# Patient Record
Sex: Female | Born: 1965 | Race: White | Hispanic: No | Marital: Married | Smoking: Former smoker
Health system: Southern US, Community
[De-identification: ages and names within clinical notes are randomized; demographics above are authoritative.]

## PROBLEM LIST (undated history)

## (undated) DIAGNOSIS — F32A Depression, unspecified: Secondary | ICD-10-CM

## (undated) DIAGNOSIS — F419 Anxiety disorder, unspecified: Secondary | ICD-10-CM

## (undated) DIAGNOSIS — E785 Hyperlipidemia, unspecified: Secondary | ICD-10-CM

## (undated) DIAGNOSIS — I498 Other specified cardiac arrhythmias: Secondary | ICD-10-CM

## (undated) DIAGNOSIS — R0602 Shortness of breath: Secondary | ICD-10-CM

## (undated) DIAGNOSIS — L0390 Cellulitis, unspecified: Secondary | ICD-10-CM

## (undated) DIAGNOSIS — E119 Type 2 diabetes mellitus without complications: Secondary | ICD-10-CM

## (undated) DIAGNOSIS — J439 Emphysema, unspecified: Secondary | ICD-10-CM

## (undated) DIAGNOSIS — E039 Hypothyroidism, unspecified: Secondary | ICD-10-CM

## (undated) DIAGNOSIS — E7800 Pure hypercholesterolemia, unspecified: Secondary | ICD-10-CM

## (undated) DIAGNOSIS — F319 Bipolar disorder, unspecified: Secondary | ICD-10-CM

## (undated) DIAGNOSIS — G4730 Sleep apnea, unspecified: Secondary | ICD-10-CM

## (undated) DIAGNOSIS — J45909 Unspecified asthma, uncomplicated: Secondary | ICD-10-CM

## (undated) DIAGNOSIS — G629 Polyneuropathy, unspecified: Secondary | ICD-10-CM

## (undated) HISTORY — DX: Shortness of breath: R06.02

## (undated) HISTORY — DX: Hypothyroidism, unspecified: E03.9

## (undated) HISTORY — PX: TUBAL LIGATION: SHX77

## (undated) HISTORY — PX: FOOT SURGERY: SHX648

## (undated) HISTORY — PX: NECK SURGERY: SHX720

## (undated) HISTORY — DX: Chest pain, unspecified: R07.9

## (undated) HISTORY — DX: Other specified cardiac arrhythmias: I49.8

## (undated) HISTORY — PX: TONSILLECTOMY: SUR1361

## (undated) HISTORY — DX: Pure hypercholesterolemia, unspecified: E78.00

## (undated) HISTORY — DX: Hyperlipidemia, unspecified: E78.5

## (undated) HISTORY — PX: NASAL SEPTUM SURGERY: SHX37

## (undated) HISTORY — DX: Emphysema, unspecified: J43.9

## (undated) HISTORY — DX: Type 2 diabetes mellitus without complications: E11.9

---

## 1998-02-26 ENCOUNTER — Inpatient Hospital Stay (HOSPITAL_COMMUNITY): Admission: AD | Admit: 1998-02-26 | Discharge: 1998-02-26 | Payer: Self-pay | Admitting: Obstetrics and Gynecology

## 1998-02-26 ENCOUNTER — Encounter: Payer: Self-pay | Admitting: Obstetrics and Gynecology

## 1998-03-20 ENCOUNTER — Encounter: Payer: Self-pay | Admitting: Obstetrics and Gynecology

## 1998-03-20 ENCOUNTER — Ambulatory Visit (HOSPITAL_COMMUNITY): Admission: RE | Admit: 1998-03-20 | Discharge: 1998-03-20 | Payer: Self-pay | Admitting: Obstetrics and Gynecology

## 1998-03-21 ENCOUNTER — Inpatient Hospital Stay (HOSPITAL_COMMUNITY): Admission: AD | Admit: 1998-03-21 | Discharge: 1998-03-23 | Payer: Self-pay | Admitting: Obstetrics and Gynecology

## 1998-03-23 ENCOUNTER — Encounter (HOSPITAL_COMMUNITY): Admission: RE | Admit: 1998-03-23 | Discharge: 1998-06-21 | Payer: Self-pay | Admitting: Obstetrics and Gynecology

## 1999-03-10 ENCOUNTER — Encounter: Admission: RE | Admit: 1999-03-10 | Discharge: 1999-03-10 | Payer: Self-pay | Admitting: Specialist

## 1999-03-10 ENCOUNTER — Encounter: Payer: Self-pay | Admitting: Specialist

## 1999-06-15 ENCOUNTER — Other Ambulatory Visit: Admission: RE | Admit: 1999-06-15 | Discharge: 1999-06-15 | Payer: Self-pay | Admitting: Obstetrics and Gynecology

## 1999-10-22 ENCOUNTER — Encounter: Admission: RE | Admit: 1999-10-22 | Discharge: 1999-10-22 | Payer: Self-pay | Admitting: Family Medicine

## 1999-10-22 ENCOUNTER — Encounter: Payer: Self-pay | Admitting: Family Medicine

## 2000-07-26 ENCOUNTER — Other Ambulatory Visit: Admission: RE | Admit: 2000-07-26 | Discharge: 2000-07-26 | Payer: Self-pay | Admitting: Obstetrics and Gynecology

## 2001-05-28 ENCOUNTER — Encounter: Admission: RE | Admit: 2001-05-28 | Discharge: 2001-05-28 | Payer: Self-pay | Admitting: Otolaryngology

## 2001-05-28 ENCOUNTER — Encounter: Payer: Self-pay | Admitting: Otolaryngology

## 2001-09-04 ENCOUNTER — Other Ambulatory Visit: Admission: RE | Admit: 2001-09-04 | Discharge: 2001-09-04 | Payer: Self-pay | Admitting: Obstetrics and Gynecology

## 2002-09-23 ENCOUNTER — Other Ambulatory Visit: Admission: RE | Admit: 2002-09-23 | Discharge: 2002-09-23 | Payer: Self-pay | Admitting: Obstetrics and Gynecology

## 2003-12-16 ENCOUNTER — Other Ambulatory Visit: Admission: RE | Admit: 2003-12-16 | Discharge: 2003-12-16 | Payer: Self-pay | Admitting: Obstetrics and Gynecology

## 2004-05-14 ENCOUNTER — Ambulatory Visit: Payer: Self-pay | Admitting: Cardiovascular Disease

## 2004-05-20 ENCOUNTER — Ambulatory Visit: Payer: Self-pay

## 2004-05-20 ENCOUNTER — Ambulatory Visit: Payer: Self-pay | Admitting: Cardiovascular Disease

## 2004-06-11 ENCOUNTER — Ambulatory Visit: Payer: Self-pay | Admitting: Cardiovascular Disease

## 2004-07-13 ENCOUNTER — Ambulatory Visit: Payer: Self-pay | Admitting: Cardiovascular Disease

## 2005-05-06 ENCOUNTER — Other Ambulatory Visit: Admission: RE | Admit: 2005-05-06 | Discharge: 2005-05-06 | Payer: Self-pay | Admitting: Obstetrics and Gynecology

## 2005-08-03 ENCOUNTER — Ambulatory Visit (HOSPITAL_COMMUNITY): Admission: RE | Admit: 2005-08-03 | Discharge: 2005-08-04 | Payer: Self-pay | Admitting: Neurosurgery

## 2006-11-17 ENCOUNTER — Ambulatory Visit: Payer: Self-pay | Admitting: Cardiovascular Disease

## 2007-12-20 ENCOUNTER — Ambulatory Visit: Payer: Self-pay | Admitting: Cardiovascular Disease

## 2008-01-08 ENCOUNTER — Encounter: Payer: Self-pay | Admitting: Cardiovascular Disease

## 2008-01-08 ENCOUNTER — Ambulatory Visit: Payer: Self-pay

## 2009-04-13 ENCOUNTER — Telehealth: Payer: Self-pay | Admitting: Cardiovascular Disease

## 2009-04-20 DIAGNOSIS — I10 Essential (primary) hypertension: Secondary | ICD-10-CM | POA: Insufficient documentation

## 2009-04-20 DIAGNOSIS — R0602 Shortness of breath: Secondary | ICD-10-CM

## 2009-04-20 DIAGNOSIS — R079 Chest pain, unspecified: Secondary | ICD-10-CM | POA: Insufficient documentation

## 2009-04-20 DIAGNOSIS — E119 Type 2 diabetes mellitus without complications: Secondary | ICD-10-CM | POA: Insufficient documentation

## 2009-04-20 DIAGNOSIS — I471 Supraventricular tachycardia, unspecified: Secondary | ICD-10-CM | POA: Insufficient documentation

## 2009-04-20 DIAGNOSIS — E7800 Pure hypercholesterolemia, unspecified: Secondary | ICD-10-CM | POA: Insufficient documentation

## 2009-04-20 DIAGNOSIS — E785 Hyperlipidemia, unspecified: Secondary | ICD-10-CM | POA: Insufficient documentation

## 2009-04-20 DIAGNOSIS — I498 Other specified cardiac arrhythmias: Secondary | ICD-10-CM | POA: Insufficient documentation

## 2009-04-20 DIAGNOSIS — E039 Hypothyroidism, unspecified: Secondary | ICD-10-CM | POA: Insufficient documentation

## 2009-04-22 ENCOUNTER — Ambulatory Visit: Payer: Self-pay | Admitting: Cardiovascular Disease

## 2009-05-12 ENCOUNTER — Telehealth: Payer: Self-pay | Admitting: Cardiovascular Disease

## 2010-02-18 ENCOUNTER — Ambulatory Visit (HOSPITAL_COMMUNITY): Admission: RE | Admit: 2010-02-18 | Discharge: 2010-02-18 | Payer: Self-pay | Admitting: Obstetrics and Gynecology

## 2010-03-22 ENCOUNTER — Encounter: Payer: Self-pay | Admitting: Cardiovascular Disease

## 2010-05-07 ENCOUNTER — Encounter: Payer: Self-pay | Admitting: Cardiovascular Disease

## 2010-05-07 ENCOUNTER — Ambulatory Visit: Payer: Self-pay | Admitting: Cardiovascular Disease

## 2010-06-08 NOTE — Progress Notes (Signed)
Summary: REFILL   Phone Note Refill Request Message from:  Patient on May 12, 2009 8:34 AM  Refills Requested: Medication #1:  CARVEDILOL 12.5 MG TABS Take one tablet by mouth twice a day [facility] [phone] PT ONLY HAS TWO MORE DAYS LEFT WANTS A FEW PILLS CALLED TO [facility] [address] [phone]  Initial call taken by: Judie Grieve,  May 12, 2009 8:36 AM    Prescriptions: CARVEDILOL 12.5 MG TABS (CARVEDILOL) Take one tablet by mouth twice a day  #30 x 0   Entered by:   Kem Parkinson   Authorized by:   Colon Branch, MD, [facility]   Signed by:   Kem Parkinson on 05/12/2009   Method used:   Electronically to        [facility] [address] [id number] (retail)       [address]       [place], [place]  [address]       Ph: [phone] or [phone]       Fax: [phone]   RxID:   [id number] CARVEDILOL 12.5 MG TABS (CARVEDILOL) Take one tablet by mouth twice a day  #180 x 3   Entered by:   Kem Parkinson   Authorized by:   Colon Branch, MD, [facility]   Signed by:   Kem Parkinson on 05/12/2009   Method used:   Electronically to        [facility]* (mail-order)             ,          Ph: [phone]       Fax: [phone]   RxID:   [id number]

## 2010-06-10 NOTE — Assessment & Plan Note (Signed)
Summary: 1 YR F/U  Medications Added LAMICTAL XR 300 MG XR24H-TAB (LAMOTRIGINE) 1 tab by mouth once daily      Allergies Added: NKDA  CC:  check up.  History of Present Illness: Veronica Rivers is seen today for f.u of HTN, elevated lipids atypicdal chest pain history of SVT.  She has been doing well.  She has some SOB in the moring when she wakes up.  I suspect she has some obstructive sleep apnea as her husband indicates she snores loudly.  She eats too many carbs and we discussed her diet and weight loss at length.  She had an normal echo in 2009.  She is not having recurrent palpitations or SSCP.  She has been compliant with her meds.  She had an LDL of 64 at her primaries recently with normal LFT's and TSH   Current Problems (verified): 1)  Hypertension  (ICD-401.9) 2)  Chest Pain  (ICD-786.50) 3)  Hypercholesterolemia  (ICD-272.0) 4)  Supraventricular Tachycardia  (ICD-427.89) 5)  Hyperlipidemia  (ICD-272.4) 6)  Hypothyroidism  (ICD-244.9) 7)  Dm  (ICD-250.00) 8)  Dyspnea  (ICD-786.05)  Current Medications (verified): 1)  Carvedilol 12.5 Mg Tabs (Carvedilol) .... Take One Tablet By Mouth Twice A Day 2)  Levothyroxine Sodium 75 Mcg Tabs (Levothyroxine Sodium) .[name].. 1 Tab  By Mouth Once Daily 3)  Lamictal Xr 300 Mg Xr24h-Tab (Lamotrigine) .[name].. 1 Tab By Mouth Once Daily 4)  Abilify 15 Mg Tabs (Aripiprazole) .[name].. 1 Tab By Mouth Once Daily 5)  Benztropine Mesylate 2 Mg Tabs (Benztropine Mesylate) .... 2 Tabs By Mouth Once Daily 6)  Seroquel 200 Mg Tabs (Quetiapine Fumarate) .[name].. 1 Tab By Mouth Once Daily 7)  Crestor 10 Mg Tabs (Rosuvastatin Calcium) .... Take One Tablet By Mouth Daily. 8)  Metformin Hcl 500 Mg Tabs (Metformin Hcl) .[name].. 1 Tab By Mouth Two Times A Day 9)  Lovaza 1 Gm Caps (Omega-3-Acid Ethyl Esters) .... 2 Tabs By Mouth Two Times A Day  Allergies (verified): No Known Drug Allergies  Past History:  Past Medical History: Last updated: 04/20/2009 Current Problems:    HYPERTENSION (ICD-401.9) CHEST PAIN (ICD-786.50) atypical  normal echo 2009 HYPERCHOLESTEROLEMIA (ICD-272.0) SUPRAVENTRICULAR TACHYCARDIA (ICD-427.89) HYPERLIPIDEMIA (ICD-272.4) HYPOTHYROIDISM (ICD-244.9) DM (ICD-250.00) DYSPNEA (ICD-786.05)  Family History: Last updated: 04/20/2009 noncontributory  Social History: Last updated: 04/22/2009 Married  71 yo daughter Sedentary Doesn't work Non-drinkier Non-smoker  Review of Systems       Denies fever, malais, weight loss, blurry vision, decreased visual acuity, cough, sputum, SOB, hemoptysis, pleuritic pain, palpitaitons, heartburn, abdominal pain, melena, lower extremity edema, claudication, or rash.   Vital Signs:  Patient profile:   45 year old female Height:      69 inches Weight:      262 pounds BMI:     38.83 Pulse rate:   85 / minute Resp:     14 per minute BP sitting:   104 / 70  (left arm)  Vitals Entered By: Kem Parkinson (May 07, 2010 10:44 AM)  Physical Exam  General:  Affect appropriate Healthy:  appears stated age HEENT: normal Neck supple with no adenopathy JVP normal no bruits no thyromegaly Lungs clear with no wheezing and good diaphragmatic motion Heart:  S1/S2 no murmur,rub, gallop or click PMI normal Abdomen: benighn, BS positve, no tenderness, no AAA no bruit.  No HSM or HJR Distal pulses intact with no bruits No edema Neuro non-focal Skin warm and dry    Impression & Recommendations:  Problem # 1:  HYPERTENSION (ICD-401.9) Well controlled Her updated medication list for this problem includes:    Carvedilol 12.5 Mg Tabs (Carvedilol) .[name]... Take one tablet by mouth twice a day  Problem # 2:  HYPERCHOLESTEROLEMIA (ICD-272.0) Continue medical Rx labs per primary Her updated medication list for this problem includes:    Crestor 10 Mg Tabs (Rosuvastatin calcium) .[name]... Take one tablet by mouth daily.    Lovaza 1 Gm Caps (Omega-3-acid ethyl esters) .[name]... 2 tabs by mouth two times  a day  Problem # 3:  SUPRAVENTRICULAR TACHYCARDIA (ICD-427.89) Quiescent continue BB Her updated medication list for this problem includes:    Carvedilol 12.5 Mg Tabs (Carvedilol) .[name]... Take one tablet by mouth twice a day  Patient Instructions: 1)  Your physician wants you to follow-up in:  ONE YEAR You will receive a reminder letter in the mail two months in advance. If you don't receive a letter, please call our office to schedule the follow-up appointment.

## 2010-07-22 LAB — BASIC METABOLIC PANEL
CO2: 26 mEq/L (ref 19–32)
Creatinine, Ser: 0.8 mg/dL (ref 0.4–1.2)
GFR calc Af Amer: >60 mL/min (ref >60)
GFR calc non Af Amer: >60 mL/min (ref >60)
Glucose, Bld: 128 mg/dL — ABNORMAL HIGH (ref 70–99)
Potassium: 4.3 mEq/L (ref 3.5–5.1)
Sodium: 135 mEq/L (ref 135–145)

## 2010-07-22 LAB — GLUCOSE, CAPILLARY
Glucose-Capillary: 160 mg/dL — ABNORMAL HIGH (ref 70–99)
Glucose-Capillary: 173 mg/dL — ABNORMAL HIGH (ref 70–99)

## 2010-07-22 LAB — CBC
HCT: 36.6 % (ref 36.0–46.0)
MCHC: 33.7 g/dL (ref 30.0–36.0)
Platelets: 276 10*3/uL (ref 150–400)
RDW: 13.5 % (ref 11.5–15.5)

## 2010-07-22 LAB — SURGICAL PCR SCREEN

## 2010-07-22 LAB — MRSA CULTURE

## 2010-09-21 NOTE — Assessment & Plan Note (Signed)
[facility]                            CARDIOLOGY OFFICE NOTE   NAME:Veronica Rivers                        MRN:          [id number]  DATE:11/16/2006                            DOB:          [birth year]    Veronica Rivers returns today for followup.  I have not seen her in about  two years.  She has had a history of SVT and hypercholesterolemia.  She  has been doing fairly well since I last saw her.  She has finally had  cervical neck surgery with Dr. Lovell Sheehan.  She has had significant  improvement.  She has chronic pain syndrome.  She has not had any  significant palpitations, PND, or orthopnea.  There has been no chest  pain.  She has had a structurally normal heart in the past.  Her LDL  cholesterol has been about 125, but I know that she has gained about 10  pounds.   In talking to her, review of systems is otherwise remarkable for  improved pain.  She is off her Depakote.  She is on a Lidoderm patch  now.  She has been compliant with her beta blocker.  Review of systems  is otherwise negative.   MEDICATIONS:  1. Flexeril 10 mg at night.  2. Vitamins.  3. Toprol 50 a day.  4. Zoloft 100 a day.  5. Opana 10 mg b.i.d. for pain.  6. Lidoderm patch.  7. Diazepam.   PHYSICAL EXAMINATION:  GENERAL:  Remarkable for a healthy-appearing  middle-aged white female in no distress.  Affect is appropriate.  VITAL SIGNS:  Weight is 231.  Blood pressure is 130/70.  Pulse is 74 and  regular.  Afebrile.  Respiratory rate is 14.  HEENT:  Normal.  NECK:  Remarkable for an anterior cervical neck scar from her surgery.  JVP is not elevated.  No thyromegaly.  No lymphadenopathy.  No bruits.  LUNGS:  Clear with good diaphragmatic motion.  No wheezing.  CARDIAC:  There is an S1 and S2 with normal heart sounds.  PMI is  normal.  ABDOMEN:  Benign.  Bowel sounds are positive.  No organomegaly.  No  hepatosplenomegaly.  No hepatojugular reflux.  VASCULAR:  His femorals  are +4 bilaterally with no bruit.  PT's are +3.  EXTREMITIES:  There is no lower extremity edema.  NEURO:  Nonfocal.  There is no muscular weakness.   Her EKG is essentially normal, showing sinus rhythm, with an incomplete  right bundle branch block.   IMPRESSION:  1. History of supraventricular tachycardia, currently stable.  No      indication for ablation.  Continue beta blocker therapy.  She will      call us if she has any exacerbations.  2. Hypercholesterolemia:  LDL 125.  Dietary and nutrition consult      given.  No need for statin drug at this time.  3. Chronic pain syndrome:  Continue current medications.  Lidoderm      patch probably actually helps with arrhythmia.   She has no documented structural heart disease.  I will  see her back in  a year.     Noralyn Pick. Eden Emms, MD, [facility]  Electronically Signed    PCN/MedQ  DD: 11/16/2006  DT: 11/16/2006  Job #: [id number]

## 2010-09-21 NOTE — Assessment & Plan Note (Signed)
[facility]                            CARDIOLOGY OFFICE NOTE   NAME:Lockyer, RANDALL RAMPERSAD                        MRN:          [id number]  DATE:12/20/2007                            DOB:          1966-05-01    Veronica Rivers returns today for followup.   HISTORY OF PRESENT ILLNESS:  Since I last saw her, she has had some  atypical chest pain.  It is sharp.  It is in the center of chest, it is  not certainly exertional.  She is trying to increase her activities  some.  There is no associated diaphoresis.  She has exertional dyspnea.  I suspect this is related to her weight.  There is no active wheezing or  coughing.  She has trace lower extremity edema.   Her LDL cholesterol was in excess of 125.  She is on Crestor 10 mg a day  now.   She also had elevated blood sugars ranging from 110-120 range.  However,  I thought it was unusual that her hemoglobin A1c was only 5.7.  Anyway,  she has been started on metformin 500 today.  I had a long discussion  with Analise today.  She understands that she has type 2 diabetes, that her  insulin resistance is due to her diet and excess weight.  She needs to  take this seriously.   She has had dietary consultation.   From a cardiac perspective, I think her chest pain is atypical and her  dyspnea is functional.  However, today's EKG showed poor R wave  progression and was read as cannot rule out a previous anterior wall MI.  Her last echocardiogram is in 2006 and showed normal LV function, but  this needs to be repeated in the setting of increasing coronary risk  factors and dyspnea.   REVIEW OF SYSTEMS:  Otherwise negative.  Apparently Clovis Riley would  like to change her Toprol.  I think it is reasonable to switch to Coreg  for decreased insulin resistance.   MEDICATIONS:  She is currently on,  1. Seroquel 100 a day.  2. Benztropine.  3. Levothyroxine 25 mcg a day.  4. Metformin 500 a day.  5. Crestor 10 a day.  6.  Toprol 50 a day.   PHYSICAL EXAMINATION:  GENERAL:  Remarkable for an overweight white  female, in no distress.  VITAL SIGNS:  Her pulse is 76 and regular.  Blood pressure is 120/60,  respiratory rate 14, afebrile.  HEENT:  Unremarkable.  NECK:  Carotids are normal without bruit, no lymphadenopathy, no  thyromegaly, no JVP elevation.  LUNGS:  Clear.  Good diaphragmatic motion.  No wheezing.  S1 and S2 with  soft systolic murmur.  PMI normal.  ABDOMEN:  Benign.  Bowel sounds positive.  No AAA, no tenderness, no  bruit, no hepatosplenomegaly, no hepatojugular reflux.  EXTREMITIES:  Distal pulses are intact.  No edema.  NEURO:  Nonfocal.  SKIN:  Warm and dry.   EKG shows sinus rhythm, poor R-wave progression, incomplete right bundle-  branch block.   IMPRESSION:  1. Atypical chest  pain.  No need for stress test at this time.  Check      2-D echocardiogram to rule out regional wall motion abnormalities.  2. Dyspnea, soft systolic murmur, abnormal EKG.  Check 2-D      echocardiogram, assess her LV function, rule out significant      valvular heart disease.  3. Diabetes.  Decrease carbohydrates.  Follow [employer] type diet.      Continue Glucophage.  Follow up with Clovis Riley.  Change Toprol      to Coreg for decreased insulin resistance.  4. Hypertension, currently well controlled.  Continue current      medication including low-sodium diet.  5. Hypothyroidism.  Continue levothyroxine 25 mcg a day.  TSH and T4      in 6 months.  6. Hyperlipidemia.  Continue Crestor, liver profile in 3 months.      Followup primary care MD as long as her echo is normal.  I will see      her back in 6 months.     Noralyn Pick. Eden Emms, MD, [facility]  Electronically Signed    PCN/MedQ  DD: 12/20/2007  DT: 12/21/2007  Job #: [id number]   cc:   Estrella Myrtle. Chestine Spore, NP

## 2010-09-24 NOTE — Op Note (Signed)
NAMESKYLYN, Veronica Rivers                 ACCOUNT NO.:  [id number]   MEDICAL RECORD NO.:  [id number]          PATIENT TYPE:  AMB   LOCATION:  SDS                          FACILITY:  [facility]   PHYSICIAN:  Cristi Loron, M.D.DATE OF BIRTH:  01-05-1966   DATE OF PROCEDURE:  08/03/2005  DATE OF DISCHARGE:                                 OPERATIVE REPORT   BRIEF HISTORY:  The patient is a 45 year old white female who suffered from  neck and right-greater-than-left arm pain.  She has failed medical  management and was worked up with cervical MRI which demonstrated a  herniated disk at C6-7.  I discussed the various treatment options with the  patient including surgery.  The patient has weighed the risks, benefits and  alternatives to surgery and has decided to proceed with a C6-7 anterior  cervical diskectomy, fusion and plating.   PREOPERATIVE DIAGNOSIS:  C6-7 herniated nucleus pulposus, spinal stenosis,  cervical radiculopathy and cervicalgia.   POSTOPERATIVE DIAGNOSIS:  C6-7 herniated nucleus pulposus, spinal stenosis,  cervical radiculopathy and cervicalgia.   PROCEDURE:  C6-7 extensive anterior cervical diskectomy/decompression;  interbody iliac crest allograft arthrodesis; anterior cervical plating  (CODMAN Slim-LOC titanium plate and screws).   SURGEON:  Cristi Loron, M.D.   ASSISTANT:  Hewitt Shorts, M.D.   ANESTHESIA:  General endotracheal.   ESTIMATED BLOOD LOSS:  50  mL.   SPECIMENS:  None.   DRAINS:  None.   DESCRIPTION OF PROCEDURE:  The patient was brought to the operating room by  the anesthesia team.  General endotracheal anesthesia was induced.  The  patient remained in a supine position.  A roll was placed under her  shoulders to place her neck in slight extension.  Her anterior cervical  region was then prepared with Betadine scrub with Betadine solution and  sterile drapes were applied.  I then injected the area to be incised with  Marcaine with  epinephrine and I used a scalpel to make a transverse incision  in the patient's left anterior neck.  I used the Metzenbaum scissors to  divide the platysma muscle and then to dissect medial to the  sternocleidomastoid muscles, jugular vein and carotid artery and I carefully  dissected down towards the anterior cervical spine, carefully identifying  the esophagus, retracting it medially.  We then cleared the soft tissue from  the anterior cervical spine using Kittner swabs and then inserted a bent  spinal needle into the upper exposed intervertebral disk space.  We obtained  intraoperative radiograph to confirm our location.   We then used electrocautery to detach the medial border of the longus colli  bilaterally from C6-7 intervertebral disk space.  We inserted the Caspar  self-retaining retractor for exposure.  We then incised the C6-7  intervertebral disk with a 15 blade scalpel and performed a partial  intervertebral diskectomy using the pituitary forceps and Carlen curettes.  We then inserted distraction screws at C6 and C7 and distracted the  interspace and then used a high-speed drill to decorticate the vertebral  endplates of C7, drill away the remainder of  the C6-7 intervertebral disk  and to thin out the posterior longitudinal ligament and drill away some of  the posterior spondylosis.  We then incised the thinned-out ligament with an  arachnoid knife and remove it with the Kerrison punch, undercutting the  vertebral endplates, decompressing the thecal sac.  We then performed  foraminotomies about the bilateral C7 nerve root, completing the  decompression.  Of note, we did encounter a moderate-sized disk herniation  on the right, compressing the right C7 nerve root.   We now turned out attention to the arthrodesis.  We obtained iliac crest  tricortical allograft and fashioned it to these approximate dimensions:  Eight millimeters in height and 1 cm in depth.  We inserted the  bone graft  in the distracted C6-C7 interspace and then removed the distraction screws.  There was a good snug fit of bone graft.   We now turned our attention to the anterior spinal instrumentation.  We used  a high-speed drill to remove some anterior spondylosis from the vertebral  endplates of C6-7 so that the plate would lay down flit.  We selected the  appropriate-length CODMAN Slim-LOC anterior cervical space and laid it along  the anterior aspect of the  vertebral bodies at C6 and C7.  We drilled two  12-mm holes at C6, two at C7.  We then secured the plate to the  intervertebral bodies by placing two 12-mm self-tapping screws at C6 and 2  at C7.  We then obtained an intraoperative radiograph.  We could see the  upper plate and screws, but the lower plate and screws we could not  visualize because of the patient's shoulders, but they looked good in vivo.  We therefore secured the screws and plate by locking each cam.   We then obtained hemostasis using bipolar electrocautery.  We irrigated the  wound out with Bacitracin solution.  We removed the solution, then removed  the Caspar self-retaining retractor.  We inspected the esophagus for any  damage and there was none apparent.  We then reapproximated the patient's  platysmal muscle with interrupted 3-0 Vicryl suture, the subcutaneous tissue  with interrupted 3-0 Vicryl and the skin with Steri-Strips and Benzoin.  The  wound was then coated with Bacitracin ointment, a sterile dressing was  applied and the drapes were removed.  The patient was subsequently extubated  by the anesthesia team and transported to the postanesthesia care unit in  stable condition.  All sponge, instrument and needle counts were correct at  the end of this case.      Cristi Loron, M.D.  Electronically Signed     JDJ/MEDQ  D:  08/03/2005  T:  08/05/2005  Job:  [id number]

## 2011-05-17 ENCOUNTER — Other Ambulatory Visit: Payer: Self-pay | Admitting: Cardiovascular Disease

## 2011-08-19 ENCOUNTER — Telehealth: Payer: Self-pay | Admitting: Cardiovascular Disease

## 2011-08-19 MED ORDER — CARVEDILOL 12.5 MG PO TABS: 12.5000 mg | ORAL_TABLET | Freq: Two times a day (BID) | ORAL | Status: DC

## 2011-08-19 NOTE — Telephone Encounter (Signed)
Pt calling re needing partial refill of Coreg, appt 4-23, needed appt before refill but is out and needs enough to last till then @ [facility] [address], pls call if any problems [phone]

## 2011-08-30 ENCOUNTER — Ambulatory Visit: Payer: Self-pay | Admitting: Cardiovascular Disease

## 2011-09-09 ENCOUNTER — Other Ambulatory Visit: Payer: Self-pay | Admitting: Family Medicine

## 2011-09-09 DIAGNOSIS — R413 Other amnesia: Secondary | ICD-10-CM

## 2011-09-09 DIAGNOSIS — R42 Dizziness and giddiness: Secondary | ICD-10-CM

## 2011-09-12 ENCOUNTER — Ambulatory Visit
Admission: RE | Admit: 2011-09-12 | Discharge: 2011-09-12 | Disposition: A | Discharge status: Home / Self Care | Payer: BC Managed Care – PPO | Source: Ambulatory Visit | Attending: Family Medicine | Admitting: Family Medicine

## 2011-09-12 DIAGNOSIS — R42 Dizziness and giddiness: Secondary | ICD-10-CM

## 2011-09-12 DIAGNOSIS — R413 Other amnesia: Secondary | ICD-10-CM

## 2011-09-12 MED ORDER — GADOBENATE DIMEGLUMINE 529 MG/ML IV SOLN
20.0000 mL | Freq: Once | INTRAVENOUS | Status: AC | PRN
  Administered 2011-09-12: 20 mL via INTRAVENOUS

## 2011-09-15 ENCOUNTER — Encounter: Payer: Self-pay | Admitting: Cardiovascular Disease

## 2011-09-15 ENCOUNTER — Encounter: Payer: Self-pay | Admitting: *Deleted

## 2011-09-16 ENCOUNTER — Encounter: Payer: Self-pay | Admitting: Cardiovascular Disease

## 2011-09-16 ENCOUNTER — Ambulatory Visit (INDEPENDENT_AMBULATORY_CARE_PROVIDER_SITE_OTHER): Payer: BC Managed Care – PPO | Admitting: Cardiovascular Disease

## 2011-09-16 VITALS — BP 135/84 | HR 70 | Wt 261.0 lb

## 2011-09-16 DIAGNOSIS — I498 Other specified cardiac arrhythmias: Secondary | ICD-10-CM

## 2011-09-16 DIAGNOSIS — E7800 Pure hypercholesterolemia, unspecified: Secondary | ICD-10-CM

## 2011-09-16 DIAGNOSIS — E119 Type 2 diabetes mellitus without complications: Secondary | ICD-10-CM

## 2011-09-16 DIAGNOSIS — I10 Essential (primary) hypertension: Secondary | ICD-10-CM

## 2011-09-16 MED ORDER — CARVEDILOL 12.5 MG PO TABS: 12.5000 mg | ORAL_TABLET | Freq: Two times a day (BID) | ORAL | Status: DC

## 2011-09-16 NOTE — Assessment & Plan Note (Signed)
Resolved.  PRN beta blocker

## 2011-09-16 NOTE — Patient Instructions (Signed)
Your physician wants you to follow-up in: YEAR WITH DR NISHAN  You will receive a reminder letter in the mail two months in advance. If you don't receive a letter, please call our office to schedule the follow-up appointment.  Your physician recommends that you continue on your current medications as directed. Please refer to the Current Medication list given to you today. 

## 2011-09-16 NOTE — Progress Notes (Signed)
Patient ID: Veronica Rivers, female   DOB: March 28, 1966, 46 y.o.   MRN: [id number] Lane[place] is seen today for f.u of HTN, elevated lipids atypicdal chest pain history of SVT. She has been doing well. Some vetigo on antivert . She eats too many carbs and we discussed her diet and weight loss at length. She had an normal echo in 2009. She is not having recurrent palpitations or SSCP. She has been compliant with her meds. She had an LDL of 64 at her primaries recently with normal LFT's and TSH  ROS: Denies fever, malais, weight loss, blurry vision, decreased visual acuity, cough, sputum, SOB, hemoptysis, pleuritic pain, palpitaitons, heartburn, abdominal pain, melena, lower extremity edema, claudication, or rash.  All other systems reviewed and negative  General: Affect appropriate Obese white female  HEENT: normal Neck supple with no adenopathy JVP normal no bruits no thyromegaly Lungs clear with no wheezing and good diaphragmatic motion Heart:  S1/S2 no murmur, no rub, gallop or click PMI normal Abdomen: benighn, BS positve, no tenderness, no AAA no bruit.  No HSM or HJR Distal pulses intact with no bruits No edema Neuro non-focal Skin warm and dry No muscular weakness   Current Outpatient Prescriptions  Medication Sig Dispense Refill  . carvedilol (COREG) 12.5 MG tablet Take 1 tablet (12.5 mg total) by mouth 2 (two) times daily with a meal.  180 tablet  0    Allergies  Review of patient's allergies indicates not on file.  Electrocardiogram: NSR rate 70 poor R wave progression from body habitus  Assessment and Plan

## 2011-09-16 NOTE — Assessment & Plan Note (Signed)
Discussed low carb diet.  Target hemoglobin A1c is 6.5 or less.  Continue current medications.  

## 2011-09-16 NOTE — Assessment & Plan Note (Signed)
Well controlled.  Continue current medications and low sodium Dash type diet.    

## 2011-09-16 NOTE — Assessment & Plan Note (Signed)
Cholesterol is at goal.  Continue current dose of statin and diet Rx.  No myalgias or side effects.  F/U  LFT's in 6 months. No results found for this basename: LDLCALC             

## 2011-09-19 NOTE — Progress Notes (Signed)
Addended by: Vista Mink D on: 09/19/2011 04:09 PM   Modules accepted: Orders

## 2012-08-02 ENCOUNTER — Other Ambulatory Visit: Payer: Self-pay | Admitting: *Deleted

## 2012-08-02 MED ORDER — CARVEDILOL 12.5 MG PO TABS: 12.5000 mg | ORAL_TABLET | Freq: Two times a day (BID) | ORAL | Status: DC

## 2012-11-09 ENCOUNTER — Encounter (HOSPITAL_COMMUNITY): Payer: Self-pay

## 2012-11-09 ENCOUNTER — Emergency Department (HOSPITAL_COMMUNITY): Payer: BC Managed Care – PPO

## 2012-11-09 ENCOUNTER — Emergency Department (HOSPITAL_COMMUNITY)
Admission: EM | Admit: 2012-11-09 | Discharge: 2012-11-10 | Disposition: A | Discharge status: Home / Self Care | Payer: BC Managed Care – PPO | Attending: Emergency Medicine | Admitting: Emergency Medicine

## 2012-11-09 DIAGNOSIS — E119 Type 2 diabetes mellitus without complications: Secondary | ICD-10-CM | POA: Insufficient documentation

## 2012-11-09 DIAGNOSIS — Y9301 Activity, walking, marching and hiking: Secondary | ICD-10-CM | POA: Insufficient documentation

## 2012-11-09 DIAGNOSIS — Y929 Unspecified place or not applicable: Secondary | ICD-10-CM | POA: Insufficient documentation

## 2012-11-09 DIAGNOSIS — Z8679 Personal history of other diseases of the circulatory system: Secondary | ICD-10-CM | POA: Insufficient documentation

## 2012-11-09 DIAGNOSIS — Z87891 Personal history of nicotine dependence: Secondary | ICD-10-CM | POA: Insufficient documentation

## 2012-11-09 DIAGNOSIS — W108XXA Fall (on) (from) other stairs and steps, initial encounter: Secondary | ICD-10-CM | POA: Insufficient documentation

## 2012-11-09 DIAGNOSIS — IMO0002 Reserved for concepts with insufficient information to code with codable children: Secondary | ICD-10-CM | POA: Insufficient documentation

## 2012-11-09 DIAGNOSIS — S93409A Sprain of unspecified ligament of unspecified ankle, initial encounter: Secondary | ICD-10-CM | POA: Insufficient documentation

## 2012-11-09 DIAGNOSIS — Z79899 Other long term (current) drug therapy: Secondary | ICD-10-CM | POA: Insufficient documentation

## 2012-11-09 DIAGNOSIS — Z9889 Other specified postprocedural states: Secondary | ICD-10-CM | POA: Insufficient documentation

## 2012-11-09 DIAGNOSIS — S93402A Sprain of unspecified ligament of left ankle, initial encounter: Secondary | ICD-10-CM

## 2012-11-09 DIAGNOSIS — E039 Hypothyroidism, unspecified: Secondary | ICD-10-CM | POA: Insufficient documentation

## 2012-11-09 DIAGNOSIS — Z23 Encounter for immunization: Secondary | ICD-10-CM | POA: Insufficient documentation

## 2012-11-09 DIAGNOSIS — E785 Hyperlipidemia, unspecified: Secondary | ICD-10-CM | POA: Insufficient documentation

## 2012-11-09 DIAGNOSIS — S80212A Abrasion, left knee, initial encounter: Secondary | ICD-10-CM

## 2012-11-09 DIAGNOSIS — I10 Essential (primary) hypertension: Secondary | ICD-10-CM | POA: Insufficient documentation

## 2012-11-09 DIAGNOSIS — E7800 Pure hypercholesterolemia, unspecified: Secondary | ICD-10-CM | POA: Insufficient documentation

## 2012-11-09 MED ORDER — TETANUS-DIPHTH-ACELL PERTUSSIS 5-2.5-18.5 LF-MCG/0.5 IM SUSP
0.5000 mL | Freq: Once | INTRAMUSCULAR | Status: AC
  Administered 2012-11-10: 0.5 mL via INTRAMUSCULAR
  Filled 2012-11-09: qty 0.5

## 2012-11-09 MED ORDER — BACITRACIN ZINC 500 UNIT/GM EX OINT
1.0000 "application " | TOPICAL_OINTMENT | Freq: Two times a day (BID) | CUTANEOUS | Status: DC
  Administered 2012-11-10: 1 via TOPICAL
  Filled 2012-11-09: qty 0.9

## 2012-11-09 MED ORDER — ONDANSETRON 4 MG PO TBDP
4.0000 mg | ORAL_TABLET | Freq: Once | ORAL | Status: AC
  Administered 2012-11-09: 4 mg via ORAL
  Filled 2012-11-09: qty 1

## 2012-11-09 MED ORDER — MORPHINE SULFATE 4 MG/ML IJ SOLN
6.0000 mg | Freq: Once | INTRAMUSCULAR | Status: AC
  Administered 2012-11-09: 6 mg via INTRAMUSCULAR
  Filled 2012-11-09: qty 2

## 2012-11-09 NOTE — ED Provider Notes (Signed)
History    CSN: [id number] Arrival date & time 11/09/12  2225  First MD Initiated Contact with Patient 11/09/12 2227     Chief Complaint  Patient presents with  . Ankle Injury   (Consider location/radiation/quality/duration/timing/severity/associated sxs/prior Treatment) HPI  Veronica Rivers is a 47 y.o. female complaining of left ankle pain and abrasion status post slip and fall while walking down steps and tripping on a shoe. Patient denies head trauma, LOC, cervicalgia, chest pain, shortness of breath, abdominal pain, difficulty moving major joints except for the left knee. She has no prior trauma to the left leg. Denies numbness, weakness, paresthesia. Pain is 7/10, exacerbated by movement, weightbearing and palpation.  Past Medical History  Diagnosis Date  . HYPOTHYROIDISM   . DM   . HYPERCHOLESTEROLEMIA   . HYPERLIPIDEMIA   . HYPERTENSION   . SUPRAVENTRICULAR TACHYCARDIA   . DYSPNEA   . CHEST PAIN    Past Surgical History  Procedure Laterality Date  . Cesarean section    . Tubal ligation    . Neck surgery    . Foot surgery    . Tonsillectomy    . Nasal septum surgery     Family History  Problem Relation Age of Onset  . Hypertension Mother    History  Substance Use Topics  . Smoking status: Former Games developer  . Smokeless tobacco: Not on file  . Alcohol Use: No   OB History   Grav Para Term Preterm Abortions TAB SAB Ect Mult Living                 Review of Systems  Constitutional:       Negative except as described in HPI  HENT:       Negative except as described in HPI  Respiratory:       Negative except as described in HPI  Cardiovascular:       Negative except as described in HPI  Gastrointestinal:       Negative except as described in HPI  Genitourinary:       Negative except as described in HPI  Musculoskeletal:       Negative except as described in HPI  Skin:       Negative except as described in HPI  Neurological:       Negative except as  described in HPI  All other systems reviewed and are negative.    Allergies  Sulfa drugs cross reactors  Home Medications   Current Outpatient Rx  Name  Route  Sig  Dispense  Refill  . ABILIFY 15 MG tablet   Oral   Take 1 tablet by mouth Daily.         . benztropine (COGENTIN) 2 MG tablet   Oral   Take 1 mg by mouth 2 (two) times daily.          . carvedilol (COREG) 12.5 MG tablet   Oral   Take 1 tablet (12.5 mg total) by mouth 2 (two) times daily with a meal.   180 tablet   3   . CRESTOR 10 MG tablet   Oral   Take 1 tablet by mouth Daily.         . hydrOXYzine (ATARAX/VISTARIL) 50 MG tablet   Oral   Take 1 tablet by mouth 3 (three) times daily.          [name] lamoTRIgine (LAMICTAL) 200 MG tablet      1 1/2 tab po qd         .  levothyroxine (SYNTHROID, LEVOTHROID) 75 MCG tablet   Oral   Take 1 tablet by mouth Daily.         [name] LORazepam (ATIVAN) 0.5 MG tablet   Oral   Take 1 tablet by mouth Daily.         [name] LOVAZA 1 G capsule   Oral   Take 1 tablet by mouth Daily.         . meclizine (ANTIVERT) 25 MG tablet   Oral   Take 1 capsule by mouth as needed.         . metFORMIN (GLUCOPHAGE) 500 MG tablet   Oral   Take 1 tablet by mouth 2 (two) times daily with a meal.          . QUEtiapine (SEROQUEL) 200 MG tablet   Oral   Take 200 mg by mouth at bedtime.          BP 147/92  Pulse 71  Temp(Src) 98.5 F (36.9 C) (Oral)  Resp 18  SpO2 94% Physical Exam  Nursing note and vitals reviewed. Constitutional: She is oriented to person, place, and time. She appears well-developed and well-nourished. No distress.  HENT:  Head: Normocephalic.  Eyes: Conjunctivae and EOM are normal.  Cardiovascular: Normal rate.   Pulmonary/Chest: Effort normal. No stridor.  Musculoskeletal: Normal range of motion.  Significant swelling and hematoma to inferior left lateral malleolus, significantly reduced range of motion. Patient has Refill less than 2  seconds x5 digits, distal sensation is grossly intact.  Knee: Several superficial abrasions,No deformity. FROM. No effusion or crepitance. Anterior and posterior drawer show no abnormal laxity. Stable to valgus and varus stress. Joint lines are non-tender. Neurovascularly intact. Pt ambulates with non-antalgic gait.    Neurological: She is alert and oriented to person, place, and time.  Psychiatric: She has a normal mood and affect.    ED Course  Procedures (including critical care time) Labs Reviewed - No data to display Dg Knee 2 Views Left  11/09/2012   *RADIOLOGY REPORT*  Clinical Data: Ankle injury.  Fell down steps with laceration and abrasion to the anterior left knee.  LEFT KNEE - 1-2 VIEW  Comparison: None.  Findings: Mild medial compartment narrowing suggesting degenerative change. No evidence of acute fracture or subluxation.  No focal bone lesions.  Bone matrix and cortex appear intact.  No abnormal radiopaque densities in the soft tissues.  No significant effusion.  IMPRESSION: Mild degenerative changes in the left knee.  No displaced fractures.   Original Report Authenticated By: Burman Nieves, M.D.   Dg Ankle Complete Left  11/09/2012   *RADIOLOGY REPORT*  Clinical Data: Larey Seat down steps with abrasions and discoloration and swelling to the lateral ankle.  LEFT ANKLE COMPLETE - 3+ VIEW  Comparison: None.  Findings: Anterior and lateral greater than medial soft tissue swelling about the left ankle. No evidence of acute fracture or subluxation.  No focal bone lesions.  Bone matrix and cortex appear intact.  No abnormal radiopaque densities in the soft tissues.  IMPRESSION: Soft tissue swelling.  No acute bony abnormalities demonstrated.   Original Report Authenticated By: Burman Nieves, M.D.   1. Ankle sprain, left, initial encounter   2. Abrasion, knee, left, initial encounter     MDM   Filed Vitals:   11/09/12 2240 11/10/12 0039  BP: 147/92 142/70  Pulse: 71 83  Temp: 98.5  F (36.9 C) 98.6 F (37 C)  TempSrc: Oral Oral  Resp: 18 20  SpO2: 94%  92%     Veronica Rivers is a 47 y.o. female with left ankle pain and swelling. Neurovascularly intact. Finger film showed no abnormalities. Patient will be given crutches, Ace wrap, recommend RICE  Medications  morphine 4 MG/ML injection 6 mg (6 mg Intramuscular Given 11/09/12 2326)  ondansetron (ZOFRAN-ODT) disintegrating tablet 4 mg (4 mg Oral Given 11/09/12 2327)  TDaP (BOOSTRIX) injection 0.5 mL (0.5 mLs Intramuscular Given 11/10/12 0011)    Pt is hemodynamically stable, appropriate for, and amenable to discharge at this time. Pt verbalized understanding and agrees with care plan. Outpatient follow-up and specific return precautions discussed.    Discharge Medication List as of 11/10/2012 12:21 AM    START taking these medications   Details  HYDROcodone-acetaminophen (NORCO/VICODIN) 5-325 MG per tablet Take 2 tablets by mouth every 4 (four) hours as needed for pain., Starting 11/10/2012, Until Discontinued, [employer], PA-C 11/11/12 1728

## 2012-11-09 NOTE — ED Notes (Signed)
[id number]<NG> Expected date:<BR> Expected time:<BR> Means of arrival:<BR> Comments:<BR> EMS; fall, ankle injury

## 2012-11-09 NOTE — ED Notes (Signed)
Per EMS, pt coming downstairs and tripped on shoe.  Pt fell down to knee and ankle twisted behind her.  Injury/swelling to left ankle noted. No head injury or LOC.  Abrasions to knee.  Hx hyperthyroid, DM, a-fib.  Vitals: 140/80, hr 78, 18, 98 %  Leg splinted and iced.

## 2012-11-10 MED ORDER — HYDROCODONE-ACETAMINOPHEN 5-325 MG PO TABS: 2.0000 | ORAL_TABLET | ORAL | Status: DC | PRN

## 2012-11-10 NOTE — ED Notes (Signed)
Crutch instruction given.

## 2012-11-11 NOTE — ED Provider Notes (Signed)
Medical screening examination/treatment/procedure(s) were performed by non-physician practitioner and as supervising physician I was immediately available for consultation/collaboration.  Urian Martenson K Linker, MD 11/11/12 1736 

## 2012-11-23 ENCOUNTER — Emergency Department (HOSPITAL_COMMUNITY): Payer: BC Managed Care – PPO

## 2012-11-23 ENCOUNTER — Encounter (HOSPITAL_COMMUNITY): Payer: Self-pay | Admitting: *Deleted

## 2012-11-23 ENCOUNTER — Inpatient Hospital Stay (HOSPITAL_COMMUNITY)
Admission: EM | Admit: 2012-11-23 | Discharge: 2012-11-27 | DRG: 278 | Disposition: A | Discharge status: Home / Self Care | Payer: BC Managed Care – PPO | Attending: Internal Medicine | Admitting: Internal Medicine

## 2012-11-23 DIAGNOSIS — Z87891 Personal history of nicotine dependence: Secondary | ICD-10-CM

## 2012-11-23 DIAGNOSIS — L03119 Cellulitis of unspecified part of limb: Principal | ICD-10-CM

## 2012-11-23 DIAGNOSIS — I498 Other specified cardiac arrhythmias: Secondary | ICD-10-CM | POA: Diagnosis present

## 2012-11-23 DIAGNOSIS — L03116 Cellulitis of left lower limb: Secondary | ICD-10-CM | POA: Diagnosis present

## 2012-11-23 DIAGNOSIS — E785 Hyperlipidemia, unspecified: Secondary | ICD-10-CM | POA: Diagnosis present

## 2012-11-23 DIAGNOSIS — I471 Supraventricular tachycardia, unspecified: Secondary | ICD-10-CM | POA: Diagnosis present

## 2012-11-23 DIAGNOSIS — L0390 Cellulitis, unspecified: Secondary | ICD-10-CM

## 2012-11-23 DIAGNOSIS — E039 Hypothyroidism, unspecified: Secondary | ICD-10-CM | POA: Diagnosis present

## 2012-11-23 DIAGNOSIS — I10 Essential (primary) hypertension: Secondary | ICD-10-CM | POA: Diagnosis present

## 2012-11-23 DIAGNOSIS — E119 Type 2 diabetes mellitus without complications: Secondary | ICD-10-CM | POA: Diagnosis present

## 2012-11-23 DIAGNOSIS — L02619 Cutaneous abscess of unspecified foot: Principal | ICD-10-CM | POA: Diagnosis present

## 2012-11-23 DIAGNOSIS — M79609 Pain in unspecified limb: Secondary | ICD-10-CM | POA: Diagnosis present

## 2012-11-23 LAB — GLUCOSE, CAPILLARY
Glucose-Capillary: 173 mg/dL — ABNORMAL HIGH (ref 70–99)
Glucose-Capillary: 134 mg/dL — ABNORMAL HIGH (ref 70–99)

## 2012-11-23 LAB — BASIC METABOLIC PANEL
BUN: 16 mg/dL (ref 6–23)
Calcium: 10.3 mg/dL (ref 8.4–10.5)
Creatinine, Ser: 0.62 mg/dL (ref 0.50–1.10)
GFR calc non Af Amer: >90 mL/min (ref >90)
Glucose, Bld: 209 mg/dL — ABNORMAL HIGH (ref 70–99)

## 2012-11-23 LAB — CBC WITH DIFFERENTIAL/PLATELET
Eosinophils Absolute: 0.2 10*3/uL (ref 0.0–0.7)
Eosinophils Relative: 3 % (ref 0–5)
Hemoglobin: 14.2 g/dL (ref 12.0–15.0)
Lymphs Abs: 2.1 10*3/uL (ref 0.7–4.0)
MCH: 30.5 pg (ref 26.0–34.0)
MCHC: 33.6 g/dL (ref 30.0–36.0)
MCV: 90.6 fL (ref 78.0–100.0)
Monocytes Absolute: 0.6 10*3/uL (ref 0.1–1.0)
Monocytes Relative: 9 % (ref 3–12)
RBC: 4.66 MIL/uL (ref 3.87–5.11)

## 2012-11-23 MED ORDER — ENOXAPARIN SODIUM 40 MG/0.4ML ~~LOC~~ SOLN
40.0000 mg | SUBCUTANEOUS | Status: DC
  Administered 2012-11-23 – 2012-11-26 (×4): 40 mg via SUBCUTANEOUS
  Filled 2012-11-23 (×5): qty 0.4

## 2012-11-23 MED ORDER — SENNOSIDES-DOCUSATE SODIUM 8.6-50 MG PO TABS
1.0000 | ORAL_TABLET | Freq: Every evening | ORAL | Status: DC | PRN
  Administered 2012-11-24: 1 via ORAL
  Filled 2012-11-23: qty 1

## 2012-11-23 MED ORDER — ONDANSETRON HCL 4 MG/2ML IJ SOLN
4.0000 mg | Freq: Four times a day (QID) | INTRAMUSCULAR | Status: DC | PRN
  Administered 2012-11-24: 4 mg via INTRAVENOUS
  Filled 2012-11-23: qty 2

## 2012-11-23 MED ORDER — CARVEDILOL 12.5 MG PO TABS
12.5000 mg | ORAL_TABLET | Freq: Two times a day (BID) | ORAL | Status: DC
  Administered 2012-11-23 – 2012-11-27 (×8): 12.5 mg via ORAL
  Filled 2012-11-23 (×10): qty 1

## 2012-11-23 MED ORDER — LAMOTRIGINE 150 MG PO TABS
300.0000 mg | ORAL_TABLET | Freq: Every day | ORAL | Status: DC
  Administered 2012-11-24 – 2012-11-27 (×4): 300 mg via ORAL
  Filled 2012-11-23 (×4): qty 2

## 2012-11-23 MED ORDER — INSULIN ASPART 100 UNIT/ML ~~LOC~~ SOLN
0.0000 [IU] | Freq: Three times a day (TID) | SUBCUTANEOUS | Status: DC
  Administered 2012-11-23: 2 [IU] via SUBCUTANEOUS
  Administered 2012-11-24: 5 [IU] via SUBCUTANEOUS
  Administered 2012-11-24: 2 [IU] via SUBCUTANEOUS
  Administered 2012-11-24: 3 [IU] via SUBCUTANEOUS

## 2012-11-23 MED ORDER — ACETAMINOPHEN 650 MG RE SUPP: 650.0000 mg | Freq: Four times a day (QID) | RECTAL | Status: DC | PRN

## 2012-11-23 MED ORDER — METFORMIN HCL 500 MG PO TABS
1000.0000 mg | ORAL_TABLET | Freq: Two times a day (BID) | ORAL | Status: DC
  Administered 2012-11-23: 1000 mg via ORAL
  Filled 2012-11-23 (×4): qty 2

## 2012-11-23 MED ORDER — LEVOTHYROXINE SODIUM 75 MCG PO TABS
75.0000 ug | ORAL_TABLET | Freq: Every day | ORAL | Status: DC
  Administered 2012-11-24 – 2012-11-27 (×4): 75 ug via ORAL
  Filled 2012-11-23 (×5): qty 1

## 2012-11-23 MED ORDER — QUETIAPINE FUMARATE 400 MG PO TABS
400.0000 mg | ORAL_TABLET | Freq: Every day | ORAL | Status: DC
  Administered 2012-11-23 – 2012-11-26 (×4): 400 mg via ORAL
  Filled 2012-11-23 (×5): qty 1

## 2012-11-23 MED ORDER — SIMVASTATIN 40 MG PO TABS
40.0000 mg | ORAL_TABLET | Freq: Every evening | ORAL | Status: DC
  Administered 2012-11-23 – 2012-11-26 (×4): 40 mg via ORAL
  Filled 2012-11-23 (×5): qty 1

## 2012-11-23 MED ORDER — ARIPIPRAZOLE 15 MG PO TABS
15.0000 mg | ORAL_TABLET | Freq: Every day | ORAL | Status: DC
  Administered 2012-11-24 – 2012-11-27 (×4): 15 mg via ORAL
  Filled 2012-11-23 (×4): qty 1

## 2012-11-23 MED ORDER — SODIUM CHLORIDE 0.9 % IV SOLN
INTRAVENOUS | Status: DC
  Administered 2012-11-23 – 2012-11-24 (×2): 1000 mL via INTRAVENOUS
  Administered 2012-11-25 (×2): via INTRAVENOUS

## 2012-11-23 MED ORDER — PIPERACILLIN-TAZOBACTAM 3.375 G IVPB
3.3750 g | Freq: Three times a day (TID) | INTRAVENOUS | Status: DC
  Administered 2012-11-23 – 2012-11-24 (×2): 3.375 g via INTRAVENOUS
  Filled 2012-11-23 (×3): qty 50

## 2012-11-23 MED ORDER — ONDANSETRON HCL 4 MG PO TABS: 4.0000 mg | ORAL_TABLET | Freq: Four times a day (QID) | ORAL | Status: DC | PRN

## 2012-11-23 MED ORDER — HYDROXYZINE HCL 50 MG PO TABS
50.0000 mg | ORAL_TABLET | Freq: Three times a day (TID) | ORAL | Status: DC
  Administered 2012-11-23 – 2012-11-27 (×11): 50 mg via ORAL
  Filled 2012-11-23 (×14): qty 1

## 2012-11-23 MED ORDER — ACETAMINOPHEN 325 MG PO TABS
650.0000 mg | ORAL_TABLET | Freq: Four times a day (QID) | ORAL | Status: DC | PRN
  Administered 2012-11-24: 650 mg via ORAL
  Filled 2012-11-23: qty 2

## 2012-11-23 MED ORDER — VANCOMYCIN HCL IN DEXTROSE 1-5 GM/200ML-% IV SOLN
1000.0000 mg | Freq: Two times a day (BID) | INTRAVENOUS | Status: DC
  Administered 2012-11-24 – 2012-11-26 (×5): 1000 mg via INTRAVENOUS
  Filled 2012-11-23 (×5): qty 200

## 2012-11-23 MED ORDER — SODIUM CHLORIDE 0.9 % IV BOLUS (SEPSIS)
1000.0000 mL | Freq: Once | INTRAVENOUS | Status: AC
  Administered 2012-11-23: 1000 mL via INTRAVENOUS

## 2012-11-23 MED ORDER — CLINDAMYCIN PHOSPHATE 900 MG/50ML IV SOLN
900.0000 mg | Freq: Once | INTRAVENOUS | Status: AC
  Administered 2012-11-23: 900 mg via INTRAVENOUS
  Filled 2012-11-23: qty 50

## 2012-11-23 MED ORDER — VANCOMYCIN HCL 10 G IV SOLR
2000.0000 mg | Freq: Once | INTRAVENOUS | Status: AC
  Administered 2012-11-23: 2000 mg via INTRAVENOUS
  Filled 2012-11-23: qty 2000

## 2012-11-23 MED ORDER — LORAZEPAM 0.5 MG PO TABS: 0.5000 mg | ORAL_TABLET | Freq: Four times a day (QID) | ORAL | Status: DC | PRN

## 2012-11-23 MED ORDER — HYDROMORPHONE HCL PF 1 MG/ML IJ SOLN
1.0000 mg | INTRAMUSCULAR | Status: DC | PRN
  Administered 2012-11-23 (×2): 1 mg via INTRAVENOUS
  Filled 2012-11-23 (×2): qty 1

## 2012-11-23 MED ORDER — OXYCODONE HCL 5 MG PO TABS
5.0000 mg | ORAL_TABLET | ORAL | Status: DC | PRN
  Administered 2012-11-23 – 2012-11-24 (×4): 5 mg via ORAL
  Filled 2012-11-23 (×4): qty 1

## 2012-11-23 NOTE — ED Notes (Signed)
Pt reports on 7/4 she fell and scraped her left shin and hurt her left ankle. Went to ED, xray was negative dx with sprain. Pt reports ankle swelling went down, but progressively ankle started showing signs of infection, increasing pain, warmth and tenderness. Pt went to pcp on Monday, given shot of Rocephin and abx augmentin, returned to pcp wed given abx doxyclicine, went to pcp today and sent to ED. pts top of left foot bright red, tender, swollen, pain 5/10.

## 2012-11-23 NOTE — ED Provider Notes (Signed)
History    CSN: [id number] Arrival date & time 11/23/12  1125  First MD Initiated Contact with Patient 11/23/12 1134     Chief Complaint  Patient presents with  . ankle infection, sent from pcp    (Consider location/radiation/quality/duration/timing/severity/associated sxs/prior Treatment) HPI  Patient presents with concerns increasing erythema and pain about her left foot. She states that over the past week she has had progressive lesion on the dorsum of the foot.  There was initially a scratch, and over the interval week the pain and erythema has spread to encompass the entire dorsum of the foot.  It is exquisitely tender, painful with motion. Symptoms have not improved in spite of using amoxicillin, Rocephin, doxycycline. No new fevers, chills, vomiting or other notable systemic complaints. Patient has seen her physician multiple times for this concern. With the progression of symptoms, she was referred here for further evaluation and management. Notably, approximately one week prior to the onset of symptoms the patient had minor trauma to the ankle and foot.  She was seen here, had reassuring x-rays, was discharged home.  Past Medical History  Diagnosis Date  . HYPOTHYROIDISM   . DM   . HYPERCHOLESTEROLEMIA   . HYPERLIPIDEMIA   . HYPERTENSION   . SUPRAVENTRICULAR TACHYCARDIA   . DYSPNEA   . CHEST PAIN    Past Surgical History  Procedure Laterality Date  . Cesarean section    . Tubal ligation    . Neck surgery    . Foot surgery    . Tonsillectomy    . Nasal septum surgery     Family History  Problem Relation Age of Onset  . Hypertension Mother    History  Substance Use Topics  . Smoking status: Former Games developer  . Smokeless tobacco: Not on file  . Alcohol Use: No   OB History   Grav Para Term Preterm Abortions TAB SAB Ect Mult Living                 Review of Systems  Constitutional:       Per HPI, otherwise negative  HENT:       Per HPI, otherwise  negative  Respiratory:       Per HPI, otherwise negative  Cardiovascular:       Per HPI, otherwise negative  Gastrointestinal: Negative for vomiting.  Endocrine:       Negative aside from HPI  Genitourinary:       Neg aside from HPI   Musculoskeletal:       Per HPI, otherwise negative  Skin: Positive for color change and wound.  Neurological: Negative for syncope.    Allergies  Sulfa drugs cross reactors  Home Medications   Current Outpatient Rx  Name  Route  Sig  Dispense  Refill  . ABILIFY 15 MG tablet   Oral   Take 1 tablet by mouth Daily.         . carvedilol (COREG) 12.5 MG tablet   Oral   Take 1 tablet (12.5 mg total) by mouth 2 (two) times daily with a meal.   180 tablet   3   . hydrOXYzine (ATARAX/VISTARIL) 50 MG tablet   Oral   Take 1 tablet by mouth 3 (three) times daily.          [name] lamoTRIgine (LAMICTAL) 200 MG tablet   Oral   Take 300 mg by mouth daily.          [name] levothyroxine (SYNTHROID, LEVOTHROID) 75  MCG tablet   Oral   Take 1 tablet by mouth Daily.         [name] LORazepam (ATIVAN) 0.5 MG tablet   Oral   Take 1 tablet by mouth every 6 (six) hours as needed for anxiety.          . metFORMIN (GLUCOPHAGE) 500 MG tablet   Oral   Take 1,000 mg by mouth 2 (two) times daily with a meal.          . oxyCODONE-acetaminophen (PERCOCET/ROXICET) 5-325 MG per tablet   Oral   Take 2 tablets by mouth every 4 (four) hours as needed for pain.         [name] QUEtiapine (SEROQUEL) 200 MG tablet   Oral   Take 400 mg by mouth at bedtime.          . simvastatin (ZOCOR) 40 MG tablet   Oral   Take 40 mg by mouth every evening.          BP 129/72  Pulse 89  Temp(Src) 98.3 F (36.8 C) (Oral)  Resp 16  SpO2 94% Physical Exam  Nursing note and vitals reviewed. Constitutional: She is oriented to person, place, and time. She appears well-developed and well-nourished. No distress.  HENT:  Head: Normocephalic and atraumatic.  Eyes: Conjunctivae and  EOM are normal.  Cardiovascular: Normal rate and regular rhythm.   Pulmonary/Chest: Effort normal and breath sounds normal. No stridor. No respiratory distress.  Abdominal: She exhibits no distension.  Musculoskeletal: She exhibits no edema.  Patient can flex and extend the ankle, though this is limited secondary to pain.  There is tenderness to palpation about the medial malleolus. There is also tenderness to palpation about the dorsal aspect of the foot.  Patient can move all digits on the affected foot appropriately.  Neurological: She is alert and oriented to person, place, and time. No cranial nerve deficit.  Skin: Skin is warm and dry.     Psychiatric: She has a normal mood and affect.    ED Course  Procedures (including critical care time) Labs Reviewed  CBC WITH DIFFERENTIAL  BASIC METABOLIC PANEL   No results found. No diagnosis found.   X-rays performed to rule out osteomyelitis given her history of diabetes, the progression of disease in spite of these antibiotics. X-ray unremarkable MDM  Patient presents after failing outpatient therapy for cellulitis of the foot.  With her description of trauma, the absence of improvement in the significant pain, additional x-rays were indicated to rule out osteomyelitis.  These were reassuring.  The patient was admitted for further evaluation and management given her comorbidities, concern for the absence of response to previously provided therapy.  Gerhard Munch, MD 11/23/12 [phone]

## 2012-11-23 NOTE — H&P (Signed)
[facility]          History and Physical    PCP:   Gaye Alken, MD   Chief Complaint:  Swelling and redness of left foot  HPI: Patient is a 47 year old woman with history of hypertension, diabetes, hyperlipidemia. She sustained a fall on July 4 where she fell down some brick steps and scratched her left leg and foot. 5 days ago she started noticing some erythema. She went to see her PCP and was given a shot of Rocephin and placed on Augmentin. She returned on Wednesday because she did not note any improvement and doxycycline was added to her regimen. She returned to see her physician today who sent her to the ED given lack of improvement. Workup in the ED notes that she is afebrile, without leukocytosis, foot and lower leg x-rays did not show any evidence for osteomyelitis. We have been asked to admit her for further evaluation and management.  Allergies:   Allergies  Allergen Reactions  . Sulfa Drugs Cross Reactors Rash      Past Medical History  Diagnosis Date  . HYPOTHYROIDISM   . DM   . HYPERCHOLESTEROLEMIA   . HYPERLIPIDEMIA   . HYPERTENSION   . SUPRAVENTRICULAR TACHYCARDIA   . DYSPNEA   . CHEST PAIN     Past Surgical History  Procedure Laterality Date  . Cesarean section    . Tubal ligation    . Neck surgery    . Foot surgery    . Tonsillectomy    . Nasal septum surgery      Prior to Admission medications   Medication Sig Start Date End Date Taking? Authorizing Provider  ABILIFY 15 MG tablet Take 1 tablet by mouth Daily. 08/10/11  Yes Historical Provider, MD  carvedilol (COREG) 12.5 MG tablet Take 1 tablet (12.5 mg total) by mouth 2 (two) times daily with a meal. 08/02/12  Yes Wendall Stade, MD  hydrOXYzine (ATARAX/VISTARIL) 50 MG tablet Take 1 tablet by mouth 3 (three) times daily.  08/30/11  Yes Historical Provider, MD  lamoTRIgine (LAMICTAL) 200 MG tablet Take 300 mg by mouth daily.  08/10/11  Yes Historical Provider, MD   levothyroxine (SYNTHROID, LEVOTHROID) 75 MCG tablet Take 1 tablet by mouth Daily. 09/07/11  Yes Historical Provider, MD  LORazepam (ATIVAN) 0.5 MG tablet Take 1 tablet by mouth every 6 (six) hours as needed for anxiety.  08/26/11  Yes Historical Provider, MD  metFORMIN (GLUCOPHAGE) 500 MG tablet Take 1,000 mg by mouth 2 (two) times daily with a meal.  07/25/11  Yes Historical Provider, MD  oxyCODONE-acetaminophen (PERCOCET/ROXICET) 5-325 MG per tablet Take 2 tablets by mouth every 4 (four) hours as needed for pain.   Yes Historical Provider, MD  QUEtiapine (SEROQUEL) 200 MG tablet Take 400 mg by mouth at bedtime.    Yes Historical Provider, MD  simvastatin (ZOCOR) 40 MG tablet Take 40 mg by mouth every evening.   Yes Historical Provider, MD    Social History:  reports that she quit smoking about 15 years ago. Her smoking use included Cigarettes. She smoked 0.00 packs per day for 15 years. She has never used smokeless tobacco. She reports that she does not drink alcohol or use illicit drugs.  Family History  Problem Relation Age of Onset  . Hypertension Mother     Review of Systems:  Constitutional: Denies fever, chills, diaphoresis, appetite change and fatigue.  HEENT: Denies photophobia, eye pain, redness, hearing loss, ear pain, congestion, sore throat,  rhinorrhea, sneezing, mouth sores, trouble swallowing, neck pain, neck stiffness and tinnitus.   Respiratory: Denies SOB, DOE, cough, chest tightness,  and wheezing.   Cardiovascular: Denies chest pain, palpitations and leg swelling.  Gastrointestinal: Denies nausea, vomiting, abdominal pain, diarrhea, constipation, blood in stool and abdominal distention.  Genitourinary: Denies dysuria, urgency, frequency, hematuria, flank pain and difficulty urinating.  Endocrine: Denies: hot or cold intolerance, sweats, changes in hair or nails, polyuria, polydipsia. Musculoskeletal: Denies myalgias, back pain, joint swelling, arthralgias and gait problem.   Skin: Denies pallor Neurological: Denies dizziness, seizures, syncope, weakness, light-headedness, numbness and headaches.  Hematological: Denies adenopathy. Easy bruising, personal or family bleeding history  Psychiatric/Behavioral: Denies suicidal ideation, mood changes, confusion, nervousness, sleep disturbance and agitation   Physical Exam: Blood pressure 106/63, pulse 76, temperature 98.3 F (36.8 C), temperature source Oral, resp. rate 18, SpO2 96.00%. General: Alert, awake, oriented x3, in no distress. HEENT: Normocephalic, atraumatic, pupils equal and reactive to light, extraocular movements intact, moist mucous membranes. Neck: Supple, no JVD, no lymphadenopathy, no bruits, no goiter. Cardiovascular: Regular rate and rhythm, no murmurs, potential S4 gallop. Lungs: Clear to auscultation bilaterally. Abdomen: Soft, nontender, nondistended, positive bowel sounds, no masses organomegaly noted. Extremities: Right no clubbing, cyanosis or edema positive pedal pulses. Left erythema and edema to the dorsum of her foot with some bruising into the second and third toes positive pulses. Neurologic: Grossly intact and nonfocal.  Labs on Admission:  Results for orders placed during the hospital encounter of 11/23/12 (from the past 48 hour(s))  CBC WITH DIFFERENTIAL     Status: None   Collection Time    11/23/12 12:35 PM      Result Value Range   WBC 7.2  4.0 - 10.5 K/uL   RBC 4.66  3.87 - 5.11 MIL/uL   Hemoglobin 14.2  12.0 - 15.0 g/dL   HCT 16.1  09.6 - 04.5 %   MCV 90.6  78.0 - 100.0 fL   MCH 30.5  26.0 - 34.0 pg   MCHC 33.6  30.0 - 36.0 g/dL   RDW 40.9  81.1 - 91.4 %   Platelets 246  150 - 400 K/uL   Neutrophils Relative % 59  43 - 77 %   Neutro Abs 4.2  1.7 - 7.7 K/uL   Lymphocytes Relative 29  12 - 46 %   Lymphs Abs 2.1  0.7 - 4.0 K/uL   Monocytes Relative 9  3 - 12 %   Monocytes Absolute 0.6  0.1 - 1.0 K/uL   Eosinophils Relative 3  0 - 5 %   Eosinophils Absolute 0.2  0.0  - 0.7 K/uL   Basophils Relative 0  0 - 1 %   Basophils Absolute 0.0  0.0 - 0.1 K/uL  BASIC METABOLIC PANEL     Status: Abnormal   Collection Time    11/23/12 12:35 PM      Result Value Range   Sodium 136  135 - 145 mEq/L   Potassium 4.7  3.5 - 5.1 mEq/L   Comment: SLIGHT HEMOLYSIS     HEMOLYSIS AT THIS LEVEL MAY AFFECT RESULT   Chloride 97  96 - 112 mEq/L   CO2 28  19 - 32 mEq/L   Glucose, Bld 209 (*) 70 - 99 mg/dL   BUN 16  6 - 23 mg/dL   Creatinine, Ser 7.82  0.50 - 1.10 mg/dL   Calcium 95.6  8.4 - 21.3 mg/dL   GFR calc non Af Amer >90  >  90 mL/min   GFR calc Af Amer >90  >90 mL/min   Comment:            The eGFR has been calculated     using the CKD EPI equation.     This calculation has not been     validated in all clinical     situations.     eGFR's persistently     <90 mL/min signify     possible Chronic Kidney Disease.    Radiological Exams on Admission: Dg Ankle 2 Views Left  11/23/2012   *RADIOLOGY REPORT*  Clinical Data: Sprained ankle November 09, 2012, increasing pain and swelling, infection question spread to bone  LEFT ANKLE - 2 VIEW  Comparison: 11/09/2012  Findings: Increased soft tissue swelling. Osseous mineralization normal. Ankle mortise intact. Tiny plantar calcaneal spur. No acute fracture, dislocation, or bone destruction.  IMPRESSION: Tiny plantar calcaneal spur. No acute osseous abnormalities.   Original Report Authenticated By: Ulyses Southward, M.D.   Dg Foot 2 Views Left  11/23/2012   *RADIOLOGY REPORT*  Clinical Data: Sprained ankle November 09, 2012, increasing pain and swelling, infection, question spread to bone  LEFT FOOT - 2 VIEW  Comparison: None  Findings: Osseous mineralization normal. Joint spaces preserved. Diffuse soft tissue swelling greatest at dorsum of foot. No acute fracture, dislocation or bone destruction. Tiny plantar calcaneal spur.  IMPRESSION: Soft tissue swelling without acute osseous findings.   Original Report Authenticated By: Ulyses Southward, M.D.     Assessment/Plan Principal Problem:   Cellulitis of left foot Active Problems:   HYPOTHYROIDISM   DM   HYPERLIPIDEMIA   SUPRAVENTRICULAR TACHYCARDIA   Left foot cellulitis -Probably stemming from injuries sustained earlier this month. -She does have a history of diabetes. As there has not been much improvement with Augmentin and doxycycline, I will start her on IV vancomycin and Zosyn. -Blood cultures will be ordered.  Hypothyroidism -Check TSH. -Continue home dose of Synthroid.  Hyperlipidemia -Continue statin.  Supraventricular tachycardia -Currently in sinus rhythm. -Continue metoprolol.  DVT prophylaxis -Lovenox.  Time Spent on Admission: 75 minutes  HERNANDEZ ACOSTA,ESTELA [facility] Pager: [phone] 11/23/2012, 3:43 PM

## 2012-11-23 NOTE — Progress Notes (Signed)
ANTIBIOTIC CONSULT NOTE - INITIAL  Pharmacy Consult for vancomycin, Zosyn Indication: cellulitis  Allergies  Allergen Reactions  . Sulfa Drugs Cross Reactors Rash    Patient Measurements: Height: 5\' 9"  (175.3 cm) Weight: 258 lb 9.6 oz (117.3 kg) IBW/kg (Calculated) : 66.2  Vital Signs: Temp: 97.7 F (36.5 C) (07/18 1554) Temp src: Oral (07/18 1129) BP: 130/66 mmHg (07/18 1554) Pulse Rate: 73 (07/18 1554) Intake/Output from previous day:   Intake/Output from this shift:    Labs:  Recent Labs  11/23/12 1235  WBC 7.2  HGB 14.2  PLT 246  CREATININE 0.62   Estimated Creatinine Clearance: 120.1 ml/min (by C-G formula based on Cr of 0.62). No results found for this basename: VANCOTROUGH, VANCOPEAK, VANCORANDOM, GENTTROUGH, GENTPEAK, GENTRANDOM, TOBRATROUGH, TOBRAPEAK, TOBRARND, AMIKACINPEAK, AMIKACINTROU, AMIKACIN,  in the last 72 hours   Microbiology: No results found for this or any previous visit (from the past 720 hour(s)).  Medical History: Past Medical History  Diagnosis Date  . HYPOTHYROIDISM   . DM   . HYPERCHOLESTEROLEMIA   . HYPERLIPIDEMIA   . HYPERTENSION   . SUPRAVENTRICULAR TACHYCARDIA   . DYSPNEA   . CHEST PAIN     Medications:  Scheduled:  . [START ON 11/24/2012] ARIPiprazole  15 mg Oral Daily  . carvedilol  12.5 mg Oral BID WC  . enoxaparin (LOVENOX) injection  40 mg Subcutaneous Q24H  . hydrOXYzine  50 mg Oral TID  . insulin aspart  0-9 Units Subcutaneous TID WC  . [START ON 11/24/2012] lamoTRIgine  300 mg Oral Daily  . [START ON 11/24/2012] levothyroxine  75 mcg Oral QAC breakfast  . metFORMIN  1,000 mg Oral BID WC  . QUEtiapine  400 mg Oral QHS  . simvastatin  40 mg Oral QPM   Infusions:  . sodium chloride 1,000 mL (11/23/12 1700)   Assessment: 46 yo woman with hx HTN, DM, hyperlipidemia sustained a fall on July 4th when fell down some steps and scratched left leg and foot. 5 days ago noticed some erythema and was given an injection of  Rocephin and started on Augmentin. She returned on Wed with no improvement and doxycycline was added to her regimen. She presents to ER today with lack of improvement and continued swelling and redness in left foot. To start on vanc and Zosyn per pharmacy dosing. Note no evidence of osteomyelitis per MRI  Normalized CrCl > 100 ml/min/1.57m2  WBC WNL and afebrile  Goal of Therapy:  Vancomycin trough level 10-15 mcg/ml  Plan:  1) Vancomycin 2g IV load then 1g IV q12 2) Zosyn 3.375g IV q8 (extended interval infusion)   Hessie Knows, PharmD, BCPS Pager [phone] 11/23/2012 5:05 PM

## 2012-11-24 DIAGNOSIS — M79609 Pain in unspecified limb: Secondary | ICD-10-CM

## 2012-11-24 DIAGNOSIS — M7989 Other specified soft tissue disorders: Secondary | ICD-10-CM

## 2012-11-24 DIAGNOSIS — E039 Hypothyroidism, unspecified: Secondary | ICD-10-CM

## 2012-11-24 DIAGNOSIS — I10 Essential (primary) hypertension: Secondary | ICD-10-CM

## 2012-11-24 DIAGNOSIS — E119 Type 2 diabetes mellitus without complications: Secondary | ICD-10-CM

## 2012-11-24 LAB — GLUCOSE, CAPILLARY: Glucose-Capillary: 224 mg/dL — ABNORMAL HIGH (ref 70–99)

## 2012-11-24 LAB — CBC
MCV: 91.8 fL (ref 78.0–100.0)
Platelets: 219 10*3/uL (ref 150–400)
RBC: 4.15 MIL/uL (ref 3.87–5.11)
RDW: 12.8 % (ref 11.5–15.5)
WBC: 5.6 10*3/uL (ref 4.0–10.5)

## 2012-11-24 LAB — BASIC METABOLIC PANEL
CO2: 30 mEq/L (ref 19–32)
Calcium: 9.4 mg/dL (ref 8.4–10.5)
Creatinine, Ser: 0.67 mg/dL (ref 0.50–1.10)
GFR calc Af Amer: >90 mL/min (ref >90)
Sodium: 138 mEq/L (ref 135–145)

## 2012-11-24 LAB — HEMOGLOBIN A1C
Hgb A1c MFr Bld: 8 % — ABNORMAL HIGH (ref <5.7)
Mean Plasma Glucose: 183 mg/dL — ABNORMAL HIGH (ref <117)

## 2012-11-24 MED ORDER — OXYCODONE-ACETAMINOPHEN 5-325 MG PO TABS
1.0000 | ORAL_TABLET | ORAL | Status: DC | PRN
  Administered 2012-11-24 – 2012-11-27 (×10): 2 via ORAL
  Filled 2012-11-24 (×11): qty 2

## 2012-11-24 MED ORDER — HYDROMORPHONE HCL PF 1 MG/ML IJ SOLN
1.0000 mg | INTRAMUSCULAR | Status: DC | PRN
  Administered 2012-11-24 – 2012-11-27 (×10): 1 mg via INTRAVENOUS
  Filled 2012-11-24 (×10): qty 1

## 2012-11-24 NOTE — Progress Notes (Addendum)
[facility] PROGRESS NOTE  Veronica Rivers:[id number] DOB: 1965/09/18 DOA: 11/23/2012 PCP: Gaye Alken, MD  Brief narrative: 47 year old female with past medical history of hypertension, dyslipidemia, hypothyroidism, diabetes, status post fall two weeks prior to this admission and subsequent development of left foot erythema and swelling 5 days prior to this admission. She was on Augmentin PO but was given rocephin in PCP office however there was no significant improvement.  In ED, her vitals remain stable with BP 105/58, HR 66 and T max 98.3 F, O2 saturation 93 - 96% on room air. X ray of the left foot and ankle showed some spurs but no evidence of osteomyelitis.  Assessment and Plan:  Principal Problem:   Cellulitis of left foot - since there are no open wounds we will discontinue zosyn and continue vancomycin for management of cellulitis - obtain left lower extremity doppler to rule out DVT considering history of trauma to the area - oxycodone 5 mg PO Q 4 hours PRN pain  Active Problems:   HYPOTHYROIDISM - continue synthroid 75 mcq daily - follow up TSH result   DM - check A1c - discontinue metformin considering an acute infection and use sliding scale isulin for now - will be able to continue Metformin upon discharge    HYPERLIPIDEMIA - continue simvastatin 40 mg at bedtime   HYPERTENSION - continue coreg 12.5 mg PO BID   SUPRAVENTRICULAR TACHYCARDIA - in sinus rhythm; has history of SVT even in 2011 per EPIC chart review - continue coreg 12.5 mg PO BID  [facility] [phone]  Consultants:  None   Procedures/Studies:  Dg Ankle 2 Views Left 11/23/2012   IMPRESSION: Tiny plantar calcaneal spur. No acute osseous abnormalities.     Dg Foot 2 Views Left 11/23/2012    IMPRESSION: Soft tissue swelling without acute osseous findings.     Antibiotics:  Zosyn 11/23/2012 --> 11/24/2012  Vancomycin 11/23/2012 -->  Code Status: Full Family Communication:  Pt at bedside Disposition Plan: Home when medically stable  HPI/Subjective: No events overnight.   Objective: Filed Vitals:   11/23/12 1437 11/23/12 1554 11/23/12 2203 11/24/12 0515  BP: 106/63 130/66 117/69 105/58  Pulse: 76 73 66 77  Temp:  97.7 F (36.5 C) 98.2 F (36.8 C) 97.6 F (36.4 C)  TempSrc:   Oral Oral  Resp: 18 16 16 16   Height:  5\' 9"  (1.753 m)    Weight:  117.3 kg (258 lb 9.6 oz)    SpO2: 96% 96% 94% 93%    Intake/Output Summary (Last 24 hours) at 11/24/12 0742 Last data filed at 11/24/12 0515  Gross per 24 hour  Intake    360 ml  Output    500 ml  Net   -140 ml    Exam:   General:  Pt is alert, follows commands appropriately, not in acute distress  Cardiovascular: Regular rate and rhythm, S1/S2, no murmurs, no rubs, no gallops  Respiratory: Clear to auscultation bilaterally, no wheezing, no crackles, no rhonchi  Abdomen: Soft, non tender, non distended, bowel sounds present, no guarding  Extremities: Left foot erythema and edema with tenderness to palpation and warm to touch, improving,  pulses DP and PT palpable bilaterally  Neuro: Grossly nonfocal  Data Reviewed: Basic Metabolic Panel:  Recent Labs Lab 11/23/12 1235 11/24/12 0443  NA 136 138  K 4.7 PENDING  CL 97 99  CO2 28 30  GLUCOSE 209* 174*  BUN 16 13  CREATININE 0.62 0.67  CALCIUM 10.3  9.4   Liver Function Tests: No results found for this basename: AST, ALT, ALKPHOS, BILITOT, PROT, ALBUMIN,  in the last 168 hours No results found for this basename: LIPASE, AMYLASE,  in the last 168 hours No results found for this basename: AMMONIA,  in the last 168 hours CBC:  Recent Labs Lab 11/23/12 1235 11/24/12 0443  WBC 7.2 5.6  NEUTROABS 4.2  --   HGB 14.2 12.6  HCT 42.2 38.1  MCV 90.6 91.8  PLT 246 219   Cardiac Enzymes: No results found for this basename: CKTOTAL, CKMB, CKMBINDEX, TROPONINI,  in the last 168 hours BNP: No components found with this basename: POCBNP,   CBG:  Recent Labs Lab 11/23/12 1726 11/23/12 2206  GLUCAP 173* 134*    No results found for this or any previous visit (from the past 240 hour(s)).   Scheduled Meds: . ARIPiprazole  15 mg Oral Daily  . carvedilol  12.5 mg Oral BID WC  . enoxaparin (LOVENOX)   40 mg Subcutaneous Q24H  . hydrOXYzine  50 mg Oral TID  . insulin aspart  0-9 Units Subcutaneous TID WC  . lamoTRIgine  300 mg Oral Daily  . levothyroxine  75 mcg Oral QAC breakfast  . QUEtiapine  400 mg Oral QHS  . simvastatin  40 mg Oral QPM  . vancomycin  1,000 mg Intravenous Q12H   Continuous Infusions: . sodium chloride 1,000 mL (11/23/12 1700)     Debbora Presto, MD  [facility] Pager [phone]  If 7PM-7AM, please contact night-coverage [email or web address] Password [email or web address] 11/24/2012, 7:42 AM   LOS: 1 day

## 2012-11-24 NOTE — Progress Notes (Signed)
VASCULAR LAB PRELIMINARY  PRELIMINARY  PRELIMINARY  PRELIMINARY  Left lower extremity venous Doppler completed.    Preliminary report:  There is no DVT or SVT noted in the left lower extremity.  Jazon Jipson, RVT 11/24/2012, 12:20 PM

## 2012-11-25 DIAGNOSIS — L0390 Cellulitis, unspecified: Secondary | ICD-10-CM

## 2012-11-25 LAB — CBC
HCT: 38.2 % (ref 36.0–46.0)
Hemoglobin: 12.4 g/dL (ref 12.0–15.0)
MCV: 92 fL (ref 78.0–100.0)
RDW: 12.7 % (ref 11.5–15.5)
WBC: 5.4 10*3/uL (ref 4.0–10.5)

## 2012-11-25 LAB — GLUCOSE, CAPILLARY
Glucose-Capillary: 167 mg/dL — ABNORMAL HIGH (ref 70–99)
Glucose-Capillary: 172 mg/dL — ABNORMAL HIGH (ref 70–99)
Glucose-Capillary: 177 mg/dL — ABNORMAL HIGH (ref 70–99)
Glucose-Capillary: 197 mg/dL — ABNORMAL HIGH (ref 70–99)

## 2012-11-25 LAB — BASIC METABOLIC PANEL
BUN: 10 mg/dL (ref 6–23)
CO2: 35 mEq/L — ABNORMAL HIGH (ref 19–32)
Chloride: 100 mEq/L (ref 96–112)
Creatinine, Ser: 0.8 mg/dL (ref 0.50–1.10)
GFR calc Af Amer: >90 mL/min (ref >90)
Glucose, Bld: 182 mg/dL — ABNORMAL HIGH (ref 70–99)
Potassium: 4.2 mEq/L (ref 3.5–5.1)

## 2012-11-25 MED ORDER — INSULIN ASPART 100 UNIT/ML ~~LOC~~ SOLN: 0.0000 [IU] | Freq: Every day | SUBCUTANEOUS | Status: DC

## 2012-11-25 MED ORDER — INSULIN ASPART 100 UNIT/ML ~~LOC~~ SOLN
0.0000 [IU] | Freq: Three times a day (TID) | SUBCUTANEOUS | Status: DC
  Administered 2012-11-25 (×3): 3 [IU] via SUBCUTANEOUS
  Administered 2012-11-26: 14:00:00 via SUBCUTANEOUS
  Administered 2012-11-26: 5 [IU] via SUBCUTANEOUS
  Administered 2012-11-26: 19:00:00 via SUBCUTANEOUS

## 2012-11-25 NOTE — Progress Notes (Signed)
Pt's Lt foot elevated on 3 pillows. It is ecchymotic on the top of 2 an 3 toes and the bottom of her foot. Pedal pulse 1. The top of her foot and inner ankle are edematous and reddened.Experiencing pain 7/10. Husband at bedside.

## 2012-11-25 NOTE — Progress Notes (Signed)
[facility] PROGRESS NOTE  RAYNAH GOMES WUJ:[id number] DOB: Sep 10, 1965 DOA: 11/23/2012 PCP: Gaye Alken, MD  Brief narrative: Veronica Rivers is an 47 y.o. female with a PMH of hypertension, dyslipidemia, hypothyroidism and diabetes who was admitted on 11/23/2012 for cellulitis after failing outpatient therapy with Augmentin (1 dose of Rocephin given in PCP office).  Assessment/Plan: Principal Problem:   Cellulitis of left foot -Initially placed on vancomycin and Zosyn. Zosyn discontinued 11/24/2012. Continue vancomycin. -Left lower extremity Dopplers were done on 11/24/2012 and ruled out DVT. -Continue pain control with oxycodone. Active Problems:   HYPOTHYROIDISM -Continue Synthroid. TSH within normal limits.   DM -Hemoglobin A1c currently 8%. Metformin on hold. Continue sliding scale insulin, but change to moderate scale. -CBGs [id number].   HYPERLIPIDEMIA -Continue Zocor.   HYPERTENSION -Continue Coreg. Blood pressure controlled.   SUPRAVENTRICULAR TACHYCARDIA -Continue Coreg. Heart rate 60s to 70s.  Code Status: Full. Family Communication: Husband at bedside. Disposition Plan: Home in 24-48 hours.   Medical Consultants:  None.  Other Consultants:  None.  Anti-infectives:  Zosyn 11/23/2012 --> 11/24/2012   Vancomycin 11/23/2012 -->   HPI/Subjective: Veronica Rivers continues to have a fair amount of left foot pain. Pain increases with weightbearing. Feels better than when she was first admitted however. No nausea, vomiting or diarrhea.  Objective: Filed Vitals:   11/24/12 0515 11/24/12 1405 11/24/12 2100 11/25/12 0500  BP: 105/58 119/58 128/68 103/66  Pulse: 77 78 76 68  Temp: 97.6 F (36.4 C) 97.5 F (36.4 C) 97.6 F (36.4 C) 97.6 F (36.4 C)  TempSrc: Oral Oral Oral Oral  Resp: 16 18 18 16   Height:      Weight:      SpO2: 93% 92% 93% 94%    Intake/Output Summary (Last 24 hours) at 11/25/12 0744 Last data filed at 11/25/12 0500  Gross  per 24 hour  Intake   1320 ml  Output   3250 ml  Net  -1930 ml    Exam: Gen:  NAD Cardiovascular:  RRR, No M/R/G Respiratory:  Lungs CTAB Gastrointestinal:  Abdomen soft, NT/ND, + BS Extremities:  Left foot with erythema that does not extend past the ankle. Superficial abrasions to the anterior tibial skin below the knee.  Data Reviewed: Basic Metabolic Panel:  Recent Labs Lab 11/23/12 1235 11/24/12 0443 11/25/12 0439  NA 136 138 138  K 4.7 3.8 4.2  CL 97 99 100  CO2 28 30 35*  GLUCOSE 209* 174* 182*  BUN 16 13 10   CREATININE 0.62 0.67 0.80  CALCIUM 10.3 9.4 9.5   GFR Estimated Creatinine Clearance: 120.1 ml/min (by C-G formula based on Cr of 0.8).  CBC:  Recent Labs Lab 11/23/12 1235 11/24/12 0443 11/25/12 0439  WBC 7.2 5.6 5.4  NEUTROABS 4.2  --   --   HGB 14.2 12.6 12.4  HCT 42.2 38.1 38.2  MCV 90.6 91.8 92.0  PLT 246 219 230   CBG:  Recent Labs Lab 11/24/12 1209 11/24/12 1657 11/24/12 2231 11/25/12 0030 11/25/12 0413  GLUCAP 227* 178* 170* 167* 172*   Hgb A1c  Recent Labs  11/23/12 1825  HGBA1C 8.0*   Thyroid function studies  Recent Labs  11/23/12 1825  TSH 2.495   Microbiology No results found for this or any previous visit (from the past 240 hour(s)).   Procedures and Diagnostic Studies:  Dg Ankle 2 Views Left 11/23/2012   *RADIOLOGY REPORT*  Clinical Data: Sprained ankle November 09, 2012, increasing pain and swelling, infection  question spread to bone  LEFT ANKLE - 2 VIEW  Comparison: 11/09/2012  Findings: Increased soft tissue swelling. Osseous mineralization normal. Ankle mortise intact. Tiny plantar calcaneal spur. No acute fracture, dislocation, or bone destruction.  IMPRESSION: Tiny plantar calcaneal spur. No acute osseous abnormalities.   Original Report Authenticated By: Ulyses Southward, M.D.    Dg Foot 2 Views Left 11/23/2012   *RADIOLOGY REPORT*  Clinical Data: Sprained ankle November 09, 2012, increasing pain and swelling, infection,  question spread to bone  LEFT FOOT - 2 VIEW  Comparison: None  Findings: Osseous mineralization normal. Joint spaces preserved. Diffuse soft tissue swelling greatest at dorsum of foot. No acute fracture, dislocation or bone destruction. Tiny plantar calcaneal spur.  IMPRESSION: Soft tissue swelling without acute osseous findings.   Original Report Authenticated By: Ulyses Southward, M.D.    Left lower extremity venous duplex 11/24/2012 Summary: No evidence of deep vein or superficial thrombosis involving the left lower extremity and right common femoral vein.    Scheduled Meds: . ARIPiprazole  15 mg Oral Daily  . carvedilol  12.5 mg Oral BID WC  . enoxaparin (LOVENOX) injection  40 mg Subcutaneous Q24H  . hydrOXYzine  50 mg Oral TID  . insulin aspart  0-9 Units Subcutaneous TID WC  . lamoTRIgine  300 mg Oral Daily  . levothyroxine  75 mcg Oral QAC breakfast  . QUEtiapine  400 mg Oral QHS  . simvastatin  40 mg Oral QPM  . vancomycin  1,000 mg Intravenous Q12H   Continuous Infusions: . sodium chloride 75 mL/hr at 11/25/12 0543    Time spent: 25 minutes.   LOS: 2 days   RAMA,CHRISTINA  [facility] Pager [phone].   *Please note that the hospitalists switch teams on Wednesdays. Please call the flow manager at [phone] if you are having difficulty reaching the hospitalist taking care of this patient as she can update you and provide the most up-to-date pager number of provider caring for the patient. If 8PM-8AM, please contact night-coverage at [email or web address], password [facility]  11/25/2012, 7:44 AM

## 2012-11-26 LAB — GLUCOSE, CAPILLARY
Glucose-Capillary: 214 mg/dL — ABNORMAL HIGH (ref 70–99)
Glucose-Capillary: 189 mg/dL — ABNORMAL HIGH (ref 70–99)
Glucose-Capillary: 149 mg/dL — ABNORMAL HIGH (ref 70–99)
Glucose-Capillary: 208 mg/dL — ABNORMAL HIGH (ref 70–99)

## 2012-11-26 MED ORDER — INSULIN GLARGINE 100 UNIT/ML ~~LOC~~ SOLN
10.0000 [IU] | Freq: Every day | SUBCUTANEOUS | Status: DC
  Administered 2012-11-26: 10 [IU] via SUBCUTANEOUS
  Filled 2012-11-26: qty 0.1

## 2012-11-26 MED ORDER — DOXYCYCLINE HYCLATE 100 MG PO TABS
100.0000 mg | ORAL_TABLET | Freq: Two times a day (BID) | ORAL | Status: DC
  Administered 2012-11-26 – 2012-11-27 (×3): 100 mg via ORAL
  Filled 2012-11-26 (×4): qty 1

## 2012-11-26 MED ORDER — CEPHALEXIN 500 MG PO CAPS
500.0000 mg | ORAL_CAPSULE | Freq: Two times a day (BID) | ORAL | Status: DC
  Filled 2012-11-26 (×2): qty 1

## 2012-11-26 MED ORDER — INSULIN ASPART 100 UNIT/ML ~~LOC~~ SOLN
0.0000 [IU] | Freq: Every day | SUBCUTANEOUS | Status: DC
  Administered 2012-11-26: 2 [IU] via SUBCUTANEOUS

## 2012-11-26 MED ORDER — HYDROMORPHONE HCL PF 1 MG/ML IJ SOLN
1.0000 mg | INTRAMUSCULAR | Status: DC | PRN
  Administered 2012-11-26 – 2012-11-27 (×3): 1 mg via INTRAMUSCULAR
  Filled 2012-11-26 (×3): qty 1

## 2012-11-26 MED ORDER — INSULIN ASPART 100 UNIT/ML ~~LOC~~ SOLN
0.0000 [IU] | Freq: Three times a day (TID) | SUBCUTANEOUS | Status: DC
  Administered 2012-11-27: 4 [IU] via SUBCUTANEOUS

## 2012-11-26 MED ORDER — SODIUM CHLORIDE 0.9 % IJ SOLN: 3.0000 mL | Freq: Two times a day (BID) | INTRAMUSCULAR | Status: DC

## 2012-11-26 MED ORDER — CEPHALEXIN 500 MG PO CAPS
500.0000 mg | ORAL_CAPSULE | Freq: Four times a day (QID) | ORAL | Status: DC
  Administered 2012-11-26 – 2012-11-27 (×5): 500 mg via ORAL
  Filled 2012-11-26 (×8): qty 1

## 2012-11-26 NOTE — Progress Notes (Signed)
Inpatient Diabetes Program Recommendations  AACE/ADA: New Consensus Statement on Inpatient Glycemic Control (2013)  Target Ranges:  Prepandial:   less than 140 mg/dL      Peak postprandial:   less than 180 mg/dL (1-2 hours)      Critically ill patients:  140 - 180 mg/dL   Reason for Visit: Hyperglycemia  Results for NECOLA, BLUESTEIN (MRN [id number]) as of 11/26/2012 16:55  Ref. Range 11/25/2012 16:55 11/25/2012 20:44 11/26/2012 08:11 11/26/2012 13:09  Glucose-Capillary Latest Range: 70-99 mg/dL 409 (H) 811 (H) 914 (H) 189 (H)   Results for GLADYCE, MCRAY (MRN [id number]) as of 11/26/2012 16:55  Ref. Range 11/23/2012 18:25  Hemoglobin A1C Latest Range: <5.7 % 8.0 (H)   Needs tighter glycemic control and f/u with PCP when discharged for diabetes management.  Inpatient Diabetes Program Recommendations Insulin - Basal: Consider basal insulin - Lantus 10 units QHS Correction (SSI): Increase Novolog to resistant tidwc and hs Outpatient Referral: Consider referral to [facility] for OP Diabetes Education for HgbA1C of 8.0%  Note: Will follow.  Thank you. Ailene Ards, RD, LDN, CDE Inpatient Diabetes Coordinator [phone]

## 2012-11-26 NOTE — Progress Notes (Signed)
Pt 's IV infiltrated in her LT ac,area swollen and sore. Ice applied. She was up ambulating in the halls with her Family and a walker, pt did well. Anticipating discharge 7/22.

## 2012-11-26 NOTE — Progress Notes (Signed)
[facility] PROGRESS NOTE  Veronica Rivers AVW:[id number] DOB: 1965-10-30 DOA: 11/23/2012 PCP: Gaye Alken, MD  Brief narrative: Veronica Rivers is an 47 y.o. female with a PMH of hypertension, dyslipidemia, hypothyroidism and diabetes who was admitted on 11/23/2012 for cellulitis after failing outpatient therapy with Augmentin (1 dose of Rocephin given in PCP office).  Assessment/Plan: Principal Problem:   Cellulitis of left foot -Initially placed on vancomycin and Zosyn. Zosyn discontinued 11/24/2012. D/C vancomycin and start Keflex/doxycycline. -Left lower extremity Dopplers were done on 11/24/2012 and ruled out DVT. -Continue pain control with oxycodone. Active Problems:   HYPOTHYROIDISM -Continue Synthroid. TSH within normal limits.   DM -Hemoglobin A1c currently 8%. Metformin on hold. Continue moderate scale sliding scale insulin. -CBGs 171-197.   HYPERLIPIDEMIA -Continue Zocor.   HYPERTENSION -Continue Coreg. Blood pressure controlled.   SUPRAVENTRICULAR TACHYCARDIA -Continue Coreg. Heart rate 60s to 70s.  Code Status: Full. Family Communication: Husband at bedside. Disposition Plan: Home in 24 hours.   Medical Consultants:  None.  Other Consultants:  None.  Anti-infectives:  Zosyn 11/23/2012 --> 11/24/2012   Vancomycin 11/23/2012 -->11/26/12  Keflex 11/26/12--->  Doxycycline 11/26/12--->   HPI/Subjective: Veronica Rivers continues to have left foot pain. Pain increases with weightbearing but able to mobilize to bathroom.  No nausea, vomiting or diarrhea.  Objective: Filed Vitals:   11/25/12 1345 11/25/12 1711 11/25/12 2100 11/26/12 0500  BP: 120/74 120/74 134/73 119/70  Pulse: 72 72 67 67  Temp: 97.4 F (36.3 C)  97.7 F (36.5 C) 97.4 F (36.3 C)  TempSrc: Oral  Oral Oral  Resp: 16  18 20   Height:      Weight:    119.341 kg (263 lb 1.6 oz)  SpO2: 97%  96% 95%    Intake/Output Summary (Last 24 hours) at 11/26/12 0731 Last data filed at  11/26/12 0500  Gross per 24 hour  Intake   5867 ml  Output   3750 ml  Net   2117 ml    Exam: Gen:  NAD Cardiovascular:  RRR, No M/R/G Respiratory:  Lungs CTAB Gastrointestinal:  Abdomen soft, NT/ND, + BS Extremities:  Left foot with erythema that does not extend past the ankle. Superficial abrasions to the anterior tibial skin below the knee.  Data Reviewed: Basic Metabolic Panel:  Recent Labs Lab 11/23/12 1235 11/24/12 0443 11/25/12 0439  NA 136 138 138  K 4.7 3.8 4.2  CL 97 99 100  CO2 28 30 35*  GLUCOSE 209* 174* 182*  BUN 16 13 10   CREATININE 0.62 0.67 0.80  CALCIUM 10.3 9.4 9.5   GFR Estimated Creatinine Clearance: 121.2 ml/min (by C-G formula based on Cr of 0.8).  CBC:  Recent Labs Lab 11/23/12 1235 11/24/12 0443 11/25/12 0439  WBC 7.2 5.6 5.4  NEUTROABS 4.2  --   --   HGB 14.2 12.6 12.4  HCT 42.2 38.1 38.2  MCV 90.6 91.8 92.0  PLT 246 219 230   CBG:  Recent Labs Lab 11/25/12 0413 11/25/12 0753 11/25/12 1241 11/25/12 1655 11/25/12 2044  GLUCAP 172* 177* 171* 171* 197*   Hgb A1c  Recent Labs  11/23/12 1825  HGBA1C 8.0*   Thyroid function studies  Recent Labs  11/23/12 1825  TSH 2.495   Microbiology No results found for this or any previous visit (from the past 240 hour(s)).   Procedures and Diagnostic Studies:  Dg Ankle 2 Views Left 11/23/2012   *RADIOLOGY REPORT*  Clinical Data: Sprained ankle November 09, 2012, increasing pain  and swelling, infection question spread to bone  LEFT ANKLE - 2 VIEW  Comparison: 11/09/2012  Findings: Increased soft tissue swelling. Osseous mineralization normal. Ankle mortise intact. Tiny plantar calcaneal spur. No acute fracture, dislocation, or bone destruction.  IMPRESSION: Tiny plantar calcaneal spur. No acute osseous abnormalities.   Original Report Authenticated By: Ulyses Southward, M.D.    Dg Foot 2 Views Left 11/23/2012   *RADIOLOGY REPORT*  Clinical Data: Sprained ankle November 09, 2012, increasing pain and  swelling, infection, question spread to bone  LEFT FOOT - 2 VIEW  Comparison: None  Findings: Osseous mineralization normal. Joint spaces preserved. Diffuse soft tissue swelling greatest at dorsum of foot. No acute fracture, dislocation or bone destruction. Tiny plantar calcaneal spur.  IMPRESSION: Soft tissue swelling without acute osseous findings.   Original Report Authenticated By: Ulyses Southward, M.D.    Left lower extremity venous duplex 11/24/2012 Summary: No evidence of deep vein or superficial thrombosis involving the left lower extremity and right common femoral vein.    Scheduled Meds: . ARIPiprazole  15 mg Oral Daily  . carvedilol  12.5 mg Oral BID WC  . enoxaparin (LOVENOX) injection  40 mg Subcutaneous Q24H  . hydrOXYzine  50 mg Oral TID  . insulin aspart  0-15 Units Subcutaneous TID WC  . insulin aspart  0-5 Units Subcutaneous QHS  . lamoTRIgine  300 mg Oral Daily  . levothyroxine  75 mcg Oral QAC breakfast  . QUEtiapine  400 mg Oral QHS  . simvastatin  40 mg Oral QPM  . vancomycin  1,000 mg Intravenous Q12H   Continuous Infusions: . sodium chloride 75 mL/hr at 11/25/12 2100    Time spent: 25 minutes.   LOS: 3 days   Gearlene Godsil  [facility] Pager [phone].   *Please note that the hospitalists switch teams on Wednesdays. Please call the flow manager at [phone] if you are having difficulty reaching the hospitalist taking care of this patient as she can update you and provide the most up-to-date pager number of provider caring for the patient. If 8PM-8AM, please contact night-coverage at [email or web address], password [facility]  11/26/2012, 7:31 AM

## 2012-11-27 LAB — GLUCOSE, CAPILLARY: Glucose-Capillary: 193 mg/dL — ABNORMAL HIGH (ref 70–99)

## 2012-11-27 MED ORDER — OXYCODONE-ACETAMINOPHEN 5-325 MG PO TABS: 2.0000 | ORAL_TABLET | ORAL | Status: DC | PRN

## 2012-11-27 MED ORDER — DOXYCYCLINE HYCLATE 100 MG PO TABS: 100.0000 mg | ORAL_TABLET | Freq: Two times a day (BID) | ORAL | Status: DC

## 2012-11-27 MED ORDER — CEPHALEXIN 500 MG PO CAPS: 500.0000 mg | ORAL_CAPSULE | Freq: Four times a day (QID) | ORAL | Status: DC

## 2012-11-27 MED ORDER — FLUCONAZOLE 150 MG PO TABS: 150.0000 mg | ORAL_TABLET | Freq: Once | ORAL | Status: DC

## 2012-11-27 NOTE — Discharge Summary (Addendum)
Physician Discharge Summary  Veronica Rivers ZOX:[id number] DOB: [birth year] DOA: 11/23/2012  PCP: Gaye Alken, MD  Admit date: 11/23/2012 Discharge date: 11/27/2012  Recommendations for Outpatient Follow-up:  1. Recommend close followup for outpatient glycemic control. Hemoglobin A1c 8%.  Discharge Diagnoses:  Principal Problem:    Cellulitis of left foot Active Problems:    HYPOTHYROIDISM    DM    HYPERLIPIDEMIA    HYPERTENSION    HISTORY OF SUPRAVENTRICULAR TACHYCARDIA   Discharge Condition: Improved.  Diet recommendation: Carbohydrate modified.  History of present illness:  Veronica Rivers is an 47 y.o. female with a PMH of hypertension, dyslipidemia, hypothyroidism and diabetes who was admitted on 11/23/2012 for cellulitis after failing outpatient therapy with Augmentin (1 dose of Rocephin given in PCP office).  Hospital Course by problem:  Principal Problem:  Cellulitis of left foot  -Initially placed on vancomycin and Zosyn. Zosyn discontinued 11/24/2012.  Vancomycin discontinued on 11/26/2012 patient subsequently was started on a combination of Keflex/doxycycline which she will continue to take for 1 additional week post discharge.  -Left lower extremity Dopplers were done on 11/24/2012 and ruled out DVT.  -Continue pain control with oxycodone.  Active Problems:  HYPOTHYROIDISM  -Continue Synthroid. TSH within normal limits.  DM  -Hemoglobin A1c currently 8%. Metformin held during hospital stay. Treated with sliding scale insulin and Lantus while in the hospital.  HYPERLIPIDEMIA  -Continue Zocor.  HYPERTENSION  -Continue Coreg. Blood pressure controlled.  SUPRAVENTRICULAR TACHYCARDIA  -Continue Coreg. Heart rate 60s to 70s.   Procedures:  Left lower extremity venous Doppler 11/24/2012: No DVT or SVT located in the left lower extremity.  Consultations:  Diabetes coordinator  Discharge Exam: Filed Vitals:   11/27/12 0544  BP: 110/66  Pulse: 77   Temp: 97.8 F (36.6 C)  Resp: 16   Filed Vitals:   11/26/12 1356 11/26/12 1739 11/26/12 2118 11/27/12 0544  BP: 123/63 123/63 129/83 110/66  Pulse: 79 79 66 77  Temp: 97.8 F (36.6 C)  98 F (36.7 C) 97.8 F (36.6 C)  TempSrc: Oral  Oral Oral  Resp: 16  18 16   Height:      Weight:      SpO2: 95%  96% 93%    Gen:  NAD Cardiovascular:  RRR, No M/R/G Respiratory: Lungs CTAB Gastrointestinal: Abdomen soft, NT/ND with normal active bowel sounds. Extremities: Left foot still with some erythema and swelling, improved over course of hospital stay   Discharge Instructions      Discharge Orders   Future Orders Complete By Expires     Call MD for:  extreme fatigue  As directed     Call MD for:  severe uncontrolled pain  As directed     Call MD for:  temperature >100.4  As directed     Diet Carb Modified  As directed     Discharge instructions  As directed     Comments:      You were cared for by Dr. Hillery Aldo  (a hospitalist) during your hospital stay. If you have any questions about your discharge medications or the care you received while you were in the hospital after you are discharged, you can call the unit and ask to speak with the hospitalist on call if the hospitalist that took care of you is not available. Once you are discharged, your primary care physician will handle any further medical issues. Please note that NO REFILLS for any discharge medications will be authorized once you are discharged,  as it is imperative that you return to your primary care physician (or establish a relationship with a primary care physician if you do not have one) for your aftercare needs so that they can reassess your need for medications and monitor your lab values.  Any outstanding tests can be reviewed by your PCP at your follow up visit.  It is also important to review any medicine changes with your PCP.  Please bring these d/c instructions with you to your next visit so your physician  can review these changes with you.  If you do not have a primary care physician, you can call [phone] for a physician referral.  It is highly recommended that you obtain a PCP for hospital follow up.    Increase activity slowly  As directed         Medication List         ABILIFY 15 MG tablet  Generic drug:  ARIPiprazole  Take 1 tablet by mouth Daily.     carvedilol 12.5 MG tablet  Commonly known as:  COREG  Take 1 tablet (12.5 mg total) by mouth 2 (two) times daily with a meal.     cephALEXin 500 MG capsule  Commonly known as:  KEFLEX  Take 1 capsule (500 mg total) by mouth every 6 (six) hours.     doxycycline 100 MG tablet  Commonly known as:  VIBRA-TABS  Take 1 tablet (100 mg total) by mouth every 12 (twelve) hours.     fluconazole 150 MG tablet  Commonly known as:  DIFLUCAN  Take 1 tablet (150 mg total) by mouth once. May repeat as needed for yeast infection.     hydrOXYzine 50 MG tablet  Commonly known as:  ATARAX/VISTARIL  Take 1 tablet by mouth 3 (three) times daily.     lamoTRIgine 200 MG tablet  Commonly known as:  LAMICTAL  Take 300 mg by mouth daily.     levothyroxine 75 MCG tablet  Commonly known as:  SYNTHROID, LEVOTHROID  Take 1 tablet by mouth Daily.     LORazepam 0.5 MG tablet  Commonly known as:  ATIVAN  Take 1 tablet by mouth every 6 (six) hours as needed for anxiety.     metFORMIN 500 MG tablet  Commonly known as:  GLUCOPHAGE  Take 1,000 mg by mouth 2 (two) times daily with a meal.     oxyCODONE-acetaminophen 5-325 MG per tablet  Commonly known as:  PERCOCET/ROXICET  Take 2 tablets by mouth every 4 (four) hours as needed for pain.     QUEtiapine 200 MG tablet  Commonly known as:  SEROQUEL  Take 400 mg by mouth at bedtime.     simvastatin 40 MG tablet  Commonly known as:  ZOCOR  Take 40 mg by mouth every evening.          The results of significant diagnostics from this hospitalization (including imaging, microbiology, ancillary  and laboratory) are listed below for reference.    Significant Diagnostic Studies: Dg Knee 2 Views Left  11/09/2012   *RADIOLOGY REPORT*  Clinical Data: Ankle injury.  Fell down steps with laceration and abrasion to the anterior left knee.  LEFT KNEE - 1-2 VIEW  Comparison: None.  Findings: Mild medial compartment narrowing suggesting degenerative change. No evidence of acute fracture or subluxation.  No focal bone lesions.  Bone matrix and cortex appear intact.  No abnormal radiopaque densities in the soft tissues.  No significant effusion.  IMPRESSION: Mild degenerative changes in  the left knee.  No displaced fractures.   Original Report Authenticated By: Burman Nieves, M.D.   Dg Ankle 2 Views Left  11/23/2012   *RADIOLOGY REPORT*  Clinical Data: Sprained ankle November 09, 2012, increasing pain and swelling, infection question spread to bone  LEFT ANKLE - 2 VIEW  Comparison: 11/09/2012  Findings: Increased soft tissue swelling. Osseous mineralization normal. Ankle mortise intact. Tiny plantar calcaneal spur. No acute fracture, dislocation, or bone destruction.  IMPRESSION: Tiny plantar calcaneal spur. No acute osseous abnormalities.   Original Report Authenticated By: Ulyses Southward, M.D.   Dg Ankle Complete Left  11/09/2012   *RADIOLOGY REPORT*  Clinical Data: Larey Seat down steps with abrasions and discoloration and swelling to the lateral ankle.  LEFT ANKLE COMPLETE - 3+ VIEW  Comparison: None.  Findings: Anterior and lateral greater than medial soft tissue swelling about the left ankle. No evidence of acute fracture or subluxation.  No focal bone lesions.  Bone matrix and cortex appear intact.  No abnormal radiopaque densities in the soft tissues.  IMPRESSION: Soft tissue swelling.  No acute bony abnormalities demonstrated.   Original Report Authenticated By: Burman Nieves, M.D.   Dg Foot 2 Views Left  11/23/2012   *RADIOLOGY REPORT*  Clinical Data: Sprained ankle November 09, 2012, increasing pain and swelling,  infection, question spread to bone  LEFT FOOT - 2 VIEW  Comparison: None  Findings: Osseous mineralization normal. Joint spaces preserved. Diffuse soft tissue swelling greatest at dorsum of foot. No acute fracture, dislocation or bone destruction. Tiny plantar calcaneal spur.  IMPRESSION: Soft tissue swelling without acute osseous findings.   Original Report Authenticated By: Ulyses Southward, M.D.    Labs:  Basic Metabolic Panel:  Recent Labs Lab 11/23/12 1235 11/24/12 0443 11/25/12 0439  NA 136 138 138  K 4.7 3.8 4.2  CL 97 99 100  CO2 28 30 35*  GLUCOSE 209* 174* 182*  BUN 16 13 10   CREATININE 0.62 0.67 0.80  CALCIUM 10.3 9.4 9.5   GFR Estimated Creatinine Clearance: 121.2 ml/min (by C-G formula based on Cr of 0.8).  CBC:  Recent Labs Lab 11/23/12 1235 11/24/12 0443 11/25/12 0439  WBC 7.2 5.6 5.4  NEUTROABS 4.2  --   --   HGB 14.2 12.6 12.4  HCT 42.2 38.1 38.2  MCV 90.6 91.8 92.0  PLT 246 219 230   CBG:  Recent Labs Lab 11/26/12 0811 11/26/12 1309 11/26/12 1803 11/26/12 2116 11/27/12 0727  GLUCAP 214* 189* 149* 208* 193*    Time coordinating discharge: 35 minutes.  Signed:  RAMA,CHRISTINA  Pager [phone] [facility] 11/27/2012, 11:04 AM

## 2013-03-14 ENCOUNTER — Other Ambulatory Visit: Payer: Self-pay

## 2013-07-29 ENCOUNTER — Other Ambulatory Visit: Payer: Self-pay | Admitting: *Deleted

## 2013-07-29 MED ORDER — CARVEDILOL 12.5 MG PO TABS: 12.5000 mg | ORAL_TABLET | Freq: Two times a day (BID) | ORAL | Status: DC

## 2013-08-07 ENCOUNTER — Ambulatory Visit (INDEPENDENT_AMBULATORY_CARE_PROVIDER_SITE_OTHER)
Admission: RE | Admit: 2013-08-07 | Discharge: 2013-08-07 | Disposition: A | Discharge status: Home / Self Care | Payer: BC Managed Care – PPO | Source: Ambulatory Visit | Attending: Cardiovascular Disease | Admitting: Cardiovascular Disease

## 2013-08-07 ENCOUNTER — Encounter: Payer: Self-pay | Admitting: Cardiovascular Disease

## 2013-08-07 ENCOUNTER — Ambulatory Visit (INDEPENDENT_AMBULATORY_CARE_PROVIDER_SITE_OTHER): Payer: BC Managed Care – PPO | Admitting: Cardiovascular Disease

## 2013-08-07 VITALS — BP 124/82 | HR 102 | Ht 68.0 in | Wt 268.0 lb

## 2013-08-07 DIAGNOSIS — R0600 Dyspnea, unspecified: Secondary | ICD-10-CM

## 2013-08-07 DIAGNOSIS — R0609 Other forms of dyspnea: Secondary | ICD-10-CM

## 2013-08-07 DIAGNOSIS — R0989 Other specified symptoms and signs involving the circulatory and respiratory systems: Secondary | ICD-10-CM

## 2013-08-07 LAB — BASIC METABOLIC PANEL
BUN: 11 mg/dL (ref 6–23)
CO2: 29 meq/L (ref 19–32)
Calcium: 9.8 mg/dL (ref 8.4–10.5)
Chloride: 99 mEq/L (ref 96–112)
Creatinine, Ser: 0.8 mg/dL (ref 0.4–1.2)
GFR: 78.12 mL/min (ref >60.00)
GLUCOSE: 273 mg/dL — AB (ref 70–99)
POTASSIUM: 4 meq/L (ref 3.5–5.1)
SODIUM: 136 meq/L (ref 135–145)

## 2013-08-07 LAB — CBC WITH DIFFERENTIAL/PLATELET
Basophils Absolute: 0 10*3/uL (ref 0.0–0.1)
Basophils Relative: 0.3 % (ref 0.0–3.0)
Eosinophils Absolute: 0.1 10*3/uL (ref 0.0–0.7)
Eosinophils Relative: 2.5 % (ref 0.0–5.0)
HEMATOCRIT: 42.5 % (ref 36.0–46.0)
Hemoglobin: 14.4 g/dL (ref 12.0–15.0)
LYMPHS ABS: 1.8 10*3/uL (ref 0.7–4.0)
Lymphocytes Relative: 33.6 % (ref 12.0–46.0)
MCHC: 33.8 g/dL (ref 30.0–36.0)
MCV: 91.1 fl (ref 78.0–100.0)
MONO ABS: 0.4 10*3/uL (ref 0.1–1.0)
Monocytes Relative: 7.5 % (ref 3.0–12.0)
Neutro Abs: 2.9 10*3/uL (ref 1.4–7.7)
Neutrophils Relative %: 56.1 % (ref 43.0–77.0)
Platelets: 238 10*3/uL (ref 150.0–400.0)
RBC: 4.67 Mil/uL (ref 3.87–5.11)
RDW: 12.8 % (ref 11.5–14.6)
WBC: 5.3 10*3/uL (ref 4.5–10.5)

## 2013-08-07 LAB — TSH: TSH: 0.39 u[IU]/mL (ref 0.35–5.50)

## 2013-08-07 LAB — T4, FREE: Free T4: 0.72 ng/dL (ref 0.60–1.60)

## 2013-08-07 LAB — BRAIN NATRIURETIC PEPTIDE: PRO B NATRI PEPTIDE: 23 pg/mL (ref 0.0–100.0)

## 2013-08-07 LAB — D-DIMER, QUANTITATIVE (NOT AT ARMC)

## 2013-08-07 NOTE — Progress Notes (Signed)
Patient ID: Veronica Rivers, female   DOB: 07/07/1965, 48 y.o.   MRN: [id number] Veronica Rivers is seen today for f.u of HTN, elevated lipids atypicdal chest pain history of SVT. She has been doing well. Some vetigo on antivert . She eats too many carbs and we discussed her diet and weight loss at length. She had an normal echo in 2009. She is not having recurrent palpitations or SSCP. She has been compliant with her meds. She had an LDL of 64 at her primaries recently with normal LFT's and TSH  Over the last 4 weeks has had marked increase in dyspnea.  Primary questioned asthma/allergies but inhalers, flonase have not helped. No LE edema PND or orthopnea but significant exrtional dyspnea.  No calf pain.  Chest gets tight with dyspnea no audible wheezing.  No fever cough or sputum  She continues to be overweight A1c about 7 She has thyroid issues and synthroid dose increased 3 months ago.   ROS: Denies fever, malais, weight loss, blurry vision, decreased visual acuity, cough, sputum,  hemoptysis, pleuritic pain, palpitaitons, heartburn, abdominal pain, melena, lower extremity edema, claudication, or rash.  All other systems reviewed and negative  General: Affect appropriate Obese white female  HEENT: normal Neck supple with no adenopathy JVP normal no bruits no thyromegaly Lungs clear with no wheezing and good diaphragmatic motion Heart:  S1 split vs s4   /S2 no murmur, no rub, gallop or click PMI normal Abdomen: benighn, BS positve, no tenderness, no AAA no bruit.  No HSM or HJR Distal pulses intact with no bruits No edema Neuro non-focal Skin warm and dry No muscular weakness   Current Outpatient Prescriptions  Medication Sig Dispense Refill  . ABILIFY 15 MG tablet Take 1 tablet by mouth Daily.      . ALBUTEROL IN Inhale into the lungs every 4 (four) hours as needed.      . carvedilol (COREG) 12.5 MG tablet Take 1 tablet (12.5 mg total) by mouth 2 (two) times daily with a meal.  60 tablet  0  .  fluticasone (FLONASE) 50 MCG/ACT nasal spray Place 2 sprays into both nostrils daily.      [name] glipiZIDE (GLUCOTROL XL) 10 MG 24 hr tablet Take 10 mg by mouth daily with breakfast.      . LamoTRIgine 300 MG TB24 Take 300 mg by mouth daily.      [name] levothyroxine (SYNTHROID, LEVOTHROID) 100 MCG tablet Take 100 mcg by mouth daily before breakfast.      . loratadine (CLARITIN) 10 MG tablet Take 10 mg by mouth daily.      [name] LORazepam (ATIVAN) 0.5 MG tablet Take 1 tablet by mouth every 6 (six) hours as needed for anxiety.       . metFORMIN (GLUCOPHAGE) 500 MG tablet Take 500-1,000 mg by mouth 2 (two) times daily with a meal. 2 in the am, 1 in pm      . Multiple Vitamins-Minerals (CENTRUM ADULTS PO) Take by mouth daily.      [name] oxyCODONE-acetaminophen (PERCOCET/ROXICET) 5-325 MG per tablet Take 1 tablet by mouth every 6 (six) hours as needed.      [name] QUEtiapine (SEROQUEL XR) 400 MG 24 hr tablet Take 400 mg by mouth at bedtime.      . simvastatin (ZOCOR) 40 MG tablet Take 40 mg by mouth every evening.       No current facility-administered medications for this visit.    Allergies  Sulfa drugs cross reactors  Electrocardiogram:  SR rate 98 poor R wave progression  No change compared to 09/16/11   Assessment and Plan

## 2013-08-07 NOTE — Assessment & Plan Note (Signed)
Well controlled.  Continue current medications and low sodium Dash type diet.    

## 2013-08-07 NOTE — Assessment & Plan Note (Signed)
Etiology not clear  Patient indicates clear change in symptoms.  CXR and labs to include TSH, Hct, d dimer and BNP today  F/U echo in light of gallop ? Split S1 to assess RV/LV function r/o pulmonary hypertension and diastolic parameters  If these are unrevealing will order cardiopulmonary stress test

## 2013-08-07 NOTE — Patient Instructions (Addendum)
Your physician wants you to follow-up in:    [place] will receive a reminder letter in the mail two months in advance. If you don't receive a letter, please call our office to schedule the follow-up appointment. Your physician recommends that you continue on your current medications as directed. Please refer to the Current Medication list given to you today.  Your physician has requested that you have an echocardiogram. Echocardiography is a painless test that uses sound waves to create images of your heart. It provides your doctor with information about the size and shape of your heart and how well your heart's chambers and valves are working. This procedure takes approximately one hour. There are no restrictions for this procedure.   A chest x-ray takes a picture of the organs and structures inside the chest, including the heart, lungs, and blood vessels. This test can show several things, including, whether the heart is enlarges; whether fluid is building up in the lungs; and whether pacemaker / defibrillator leads are still in place. Your physician has recommended that you have a cardiopulmonary stress test (CPX). CPX testing is a non-invasive measurement of heart and lung function. It replaces a traditional treadmill stress test. This type of test provides a tremendous amount of information that relates not only to your present condition but also for future outcomes. This test combines measurements of you ventilation, respiratory gas exchange in the lungs, electrocardiogram (EKG), blood pressure and physical response before, during, and following an exercise protocol. IN   3  WEEKS PENDING   ECHO  CXR  AND  LABS ARE  [place]  Your physician recommends that you return for lab work in:  TODAY   BMET  ,CBC  ,TSH ,FREE T4 , D DIMER  , AND BNP

## 2013-08-07 NOTE — Assessment & Plan Note (Signed)
Discussed low carb diet.  Target hemoglobin A1c is 6.5 or less.  Continue current medications.  

## 2013-08-12 ENCOUNTER — Ambulatory Visit (HOSPITAL_COMMUNITY): Payer: BC Managed Care – PPO | Attending: Cardiovascular Disease | Admitting: Radiology

## 2013-08-12 DIAGNOSIS — R0600 Dyspnea, unspecified: Secondary | ICD-10-CM

## 2013-08-12 DIAGNOSIS — R0609 Other forms of dyspnea: Secondary | ICD-10-CM | POA: Insufficient documentation

## 2013-08-12 DIAGNOSIS — R0989 Other specified symptoms and signs involving the circulatory and respiratory systems: Principal | ICD-10-CM

## 2013-08-12 NOTE — Progress Notes (Signed)
Echocardiogram performed.  

## 2013-08-28 ENCOUNTER — Encounter (HOSPITAL_COMMUNITY): Payer: BC Managed Care – PPO

## 2013-09-03 ENCOUNTER — Other Ambulatory Visit: Payer: Self-pay | Admitting: *Deleted

## 2013-09-03 MED ORDER — CARVEDILOL 12.5 MG PO TABS: 12.5000 mg | ORAL_TABLET | Freq: Two times a day (BID) | ORAL | Status: DC

## 2013-09-20 ENCOUNTER — Other Ambulatory Visit: Payer: Self-pay

## 2013-09-20 MED ORDER — CARVEDILOL 12.5 MG PO TABS: 12.5000 mg | ORAL_TABLET | Freq: Two times a day (BID) | ORAL | Status: DC

## 2013-12-23 ENCOUNTER — Encounter (HOSPITAL_COMMUNITY): Payer: Self-pay | Admitting: Emergency Medicine

## 2013-12-23 ENCOUNTER — Emergency Department (HOSPITAL_COMMUNITY)
Admission: EM | Admit: 2013-12-23 | Discharge: 2013-12-23 | Disposition: A | Discharge status: Home / Self Care | Payer: BC Managed Care – PPO | Attending: Emergency Medicine | Admitting: Emergency Medicine

## 2013-12-23 DIAGNOSIS — Z87891 Personal history of nicotine dependence: Secondary | ICD-10-CM | POA: Diagnosis not present

## 2013-12-23 DIAGNOSIS — E039 Hypothyroidism, unspecified: Secondary | ICD-10-CM | POA: Insufficient documentation

## 2013-12-23 DIAGNOSIS — Z79899 Other long term (current) drug therapy: Secondary | ICD-10-CM | POA: Diagnosis not present

## 2013-12-23 DIAGNOSIS — I10 Essential (primary) hypertension: Secondary | ICD-10-CM | POA: Insufficient documentation

## 2013-12-23 DIAGNOSIS — L03213 Periorbital cellulitis: Secondary | ICD-10-CM

## 2013-12-23 DIAGNOSIS — H05019 Cellulitis of unspecified orbit: Secondary | ICD-10-CM | POA: Diagnosis not present

## 2013-12-23 DIAGNOSIS — H5789 Other specified disorders of eye and adnexa: Secondary | ICD-10-CM | POA: Diagnosis present

## 2013-12-23 DIAGNOSIS — E119 Type 2 diabetes mellitus without complications: Secondary | ICD-10-CM | POA: Diagnosis not present

## 2013-12-23 MED ORDER — CLINDAMYCIN HCL 150 MG PO CAPS: 300.0000 mg | ORAL_CAPSULE | Freq: Three times a day (TID) | ORAL | Status: DC

## 2013-12-23 MED ORDER — CLINDAMYCIN HCL 300 MG PO CAPS
300.0000 mg | ORAL_CAPSULE | Freq: Once | ORAL | Status: AC
  Administered 2013-12-23: 300 mg via ORAL
  Filled 2013-12-23: qty 1

## 2013-12-23 MED ORDER — ONDANSETRON 8 MG PO TBDP
8.0000 mg | ORAL_TABLET | Freq: Once | ORAL | Status: AC
  Administered 2013-12-23: 8 mg via ORAL
  Filled 2013-12-23: qty 1

## 2013-12-23 MED ORDER — OXYCODONE-ACETAMINOPHEN 5-325 MG PO TABS
2.0000 | ORAL_TABLET | Freq: Once | ORAL | Status: AC
  Administered 2013-12-23: 2 via ORAL
  Filled 2013-12-23: qty 2

## 2013-12-23 MED ORDER — ONDANSETRON HCL 4 MG PO TABS: 4.0000 mg | ORAL_TABLET | Freq: Four times a day (QID) | ORAL | Status: DC

## 2013-12-23 MED ORDER — OXYCODONE-ACETAMINOPHEN 5-325 MG PO TABS
1.0000 | ORAL_TABLET | Freq: Four times a day (QID) | ORAL | Status: DC | PRN
Start: 2013-12-23 — End: 2013-12-31

## 2013-12-23 NOTE — ED Provider Notes (Signed)
CSN: [id number]     Arrival date & time 12/23/13  2007 History   First MD Initiated Contact with Patient 12/23/13 2138     Chief Complaint  Patient presents with  . Eye Problem     (Consider location/radiation/quality/duration/timing/severity/associated sxs/prior Treatment) HPI Comments: Patient is a 48 year old female history of hypothyroidism, diabetes, hyperlipidemia, hypertension who presents to the emergency department today with swelling to the right of her face. She states that it began yesterday and has gradually worsened. It started as a "knot" on her right temple. She was evaluated by her primary care physician earlier today who suggested she come to the emergency department for further evaluation. She denies any blurry or double vision. She denies any pain with EOMs. She took a ibuprofen and a hydrocodone without relief of her symptoms. No fevers, chills, nausea, vomiting, myalgias.  The history is provided by the patient. No language interpreter was used.    Past Medical History  Diagnosis Date  . HYPOTHYROIDISM   . DM   . HYPERCHOLESTEROLEMIA   . HYPERLIPIDEMIA   . HYPERTENSION   . SUPRAVENTRICULAR TACHYCARDIA   . DYSPNEA   . CHEST PAIN    Past Surgical History  Procedure Laterality Date  . Cesarean section    . Tubal ligation    . Neck surgery    . Foot surgery    . Tonsillectomy    . Nasal septum surgery     Family History  Problem Relation Age of Onset  . Hypertension Mother    History  Substance Use Topics  . Smoking status: Former Smoker -- 15 years    Types: Cigarettes    Quit date: 11/23/1997  . Smokeless tobacco: Never Used  . Alcohol Use: No   OB History   Grav Para Term Preterm Abortions TAB SAB Ect Mult Living                 Review of Systems  Constitutional: Negative for fever and chills.  Eyes: Negative for photophobia and visual disturbance.       Preseptal swelling  Respiratory: Negative for shortness of breath.   Cardiovascular:  Negative for chest pain.  Gastrointestinal: Negative for nausea, vomiting and abdominal pain.  All other systems reviewed and are negative.     Allergies  Zocor and Sulfa drugs cross reactors  Home Medications   Prior to Admission medications   Medication Sig Start Date End Date Taking? Authorizing Provider  ABILIFY 15 MG tablet Take 1 tablet by mouth Daily. 08/10/11  Yes Historical Provider, MD  ALBUTEROL IN Inhale into the lungs every 4 (four) hours as needed.   Yes Historical Provider, MD  carvedilol (COREG) 12.5 MG tablet Take 1 tablet (12.5 mg total) by mouth 2 (two) times daily with a meal. 09/20/13  Yes Josue Hector, MD  LamoTRIgine 300 MG TB24 Take 300 mg by mouth daily.   Yes Historical Provider, MD  levothyroxine (SYNTHROID, LEVOTHROID) 100 MCG tablet Take 100 mcg by mouth at bedtime.    Yes Historical Provider, MD  LORazepam (ATIVAN) 0.5 MG tablet Take 1 tablet by mouth every 6 (six) hours as needed for anxiety.  08/26/11  Yes Historical Provider, MD  metFORMIN (GLUCOPHAGE) 500 MG tablet Take 500-1,000 mg by mouth 2 (two) times daily with a meal. 2 in the am, 1 in pm 07/25/11  Yes Historical Provider, MD  oxyCODONE-acetaminophen (PERCOCET/ROXICET) 5-325 MG per tablet Take 1 tablet by mouth every 6 (six) hours as needed for moderate  pain.  11/27/12  Yes Venetia Maxon Rama, MD  polycarbophil (FIBERCON) 625 MG tablet Take 625 mg by mouth daily as needed for severe constipation.   Yes Historical Provider, MD  QUEtiapine (SEROQUEL XR) 400 MG 24 hr tablet Take 400 mg by mouth at bedtime.   Yes Historical Provider, MD  ranitidine (ZANTAC) 150 MG tablet Take 150 mg by mouth daily as needed for heartburn.   Yes Historical Provider, MD   BP 146/83  Pulse 82  Temp(Src) 98.7 F (37.1 C) (Oral)  Resp 20  Ht 5\' 9"  (1.753 m)  Wt 254 lb (115.214 kg)  BMI 37.49 kg/m2  SpO2 95% Physical Exam  Nursing note and vitals reviewed. Constitutional: She is oriented to person, place, and time. She  appears well-developed and well-nourished. No distress.  HENT:  Head: Normocephalic and atraumatic.  Right Ear: External ear normal.  Left Ear: External ear normal.  Nose: Nose normal.  Mouth/Throat: Oropharynx is clear and moist.  Eyes: Conjunctivae and EOM are normal. Pupils are equal, round, and reactive to light.  Swelling to right eyelid. Erythema stemming from indurated area in right temple. No pain with EOMs. No photophobia.   Neck: Normal range of motion.  Cardiovascular: Normal rate, regular rhythm and normal heart sounds.   Pulmonary/Chest: Effort normal and breath sounds normal. No stridor. No respiratory distress. She has no wheezes. She has no rales.  Abdominal: Soft. She exhibits no distension.  Musculoskeletal: Normal range of motion.  Neurological: She is alert and oriented to person, place, and time. She has normal strength.  Skin: Skin is warm and dry. She is not diaphoretic. No erythema.  Psychiatric: She has a normal mood and affect. Her behavior is normal.    ED Course  Procedures (including critical care time) Labs Review Labs Reviewed - No data to display  Imaging Review No results found.   EKG Interpretation None      MDM   Final diagnoses:  Preseptal cellulitis of right eye    Patient presents to emergency department with preseptal cellulitis. At this point I do not have a concern for orbital cellulitis. No pain with EOMs. Patient is afebrile, normotensive. Patient is nonseptic, nontoxic in appearance. She was given clindamycin in the emergency department as well as sent home with a prescription. She was given pain medicine. She will followup with her primary care physician. Discussed reasons to return to emergency Department immediately. Vital signs stable for discharge. Dr. Mingo Amber evaluated patient and agrees with plan. Patient / Family / Caregiver informed of clinical course, understand medical decision-making process, and agree with  plan.     Elwyn Lade, PA-C 12/24/13 0225

## 2013-12-23 NOTE — ED Notes (Signed)
Pt reports noticing a "knot" in her R temple yesterday, noticed redness and swelling around her R eye today.  Denies any vision changes at this time.  Pt was sent to the ED by her PMD.

## 2013-12-23 NOTE — Discharge Instructions (Signed)
Periorbital Cellulitis °Periorbital cellulitis is a common infection that can affect the eyelid and the soft tissues that surround the eyeball. The infection may also affect the structures that produce and drain tears. It does not affect the eyeball itself. Natural tissue barriers usually prevent the spread of this infection to the eyeball and other deeper areas of the eye socket.      °CAUSES °· Bacterial infection. °· Long-term (chronic) sinus infections. °· An object (foreign body) stuck behind the eye. °· An injury that goes through the eyelid tissues. °· An injury that causes an infection, such as an insect sting. °· Fracture of the bone around the eye. °· Infections which have spread from the eyelid or other structures around the eye. °· Bite wounds. °· Inflammation or infection of the lining membranes of the brain (meningitis). °· An infection in the blood (septicemia). °· Dental infection (abscess). °· Viral infection (this is rare). °SYMPTOMS °Symptoms usually come on suddenly. °· Pain in the eye. °· Red, hot, and swollen eyelids and possibly cheeks. The swelling is sometimes bad enough that the eyelids cannot open. Some infections make the eyelids look purple. °· Fever and feeling generally ill. °· Pain when touching the area around the eye. °DIAGNOSIS  °Periorbital cellulitis can be diagnosed from an eye exam. In severe cases, your caregiver might suggest: °· Blood tests. °· Imaging tests (such as a CT scan) to examine the sinuses and the area around and behind the eyeball. °TREATMENT °If your caregiver feels that you do not have any signs of serious infection, treatment may include: °· Antibiotics. °· Nasal decongestants to reduce swelling. °· Referral to a dentist if it is suspected that the infection was caused by a prior tooth infection. °· Examination every day to make sure the problem is improving. °HOME CARE INSTRUCTIONS °· Take your antibiotics as directed. Finish them even if you start to feel  better. °· Some pain is normal with this condition. Take pain medicine as directed by your caregiver. Only take pain medicines approved by your caregiver. °· It is important to drink fluids. Drink enough water and fluids to keep your urine clear or pale yellow. °· Do not smoke. °· Rest and get plenty of sleep. °· Mild or moderate fevers generally have no long-term effects and often do not require treatment. °· If your caregiver has given you a follow-up appointment, it is very important to keep that appointment. Your caregiver will need to make sure that the infection is getting better. It is important to check that a more serious infection is not developing. °SEEK IMMEDIATE MEDICAL CARE IF: °· Your eyelids become more painful, red, warm, or swollen. °· You develop double vision or your vision becomes blurred or worsens in any way. °· You have trouble moving your eyes. °· The eye looks like it is popping out (proptosis). °· You develop a severe headache, severe neck pain, or neck stiffness. °· You develop repeated vomiting. °· You have a fever or persistent symptoms for more than 72 hours. °· You have a fever and your symptoms suddenly get worse. °MAKE SURE YOU: °· Understand these instructions. °· Will watch your condition. °· Will get help right away if you are not doing well or get worse. °Document Released: 05/28/2010 Document Revised: 07/18/2011 Document Reviewed: 05/28/2010 °[employer]® Patient Information ©2015 [employer]. This information is not intended to replace advice given to you by your health care provider. Make sure you discuss any questions you have with your health care provider. ° °

## 2013-12-24 ENCOUNTER — Other Ambulatory Visit: Payer: Self-pay | Admitting: Cardiovascular Disease

## 2013-12-24 ENCOUNTER — Other Ambulatory Visit: Payer: Self-pay | Admitting: *Deleted

## 2013-12-24 MED ORDER — CARVEDILOL 12.5 MG PO TABS: ORAL_TABLET | ORAL | Status: DC

## 2013-12-24 NOTE — ED Provider Notes (Signed)
Medical screening examination/treatment/procedure(s) were conducted as a shared visit with non-physician practitioner(s) and myself.  I personally evaluated the patient during the encounter.   EKG Interpretation None      Patient here for R eyelid swelling. Edematous upper and lower lid, small ingrown hair in hairline at temple looks to be source. No pain with EOMs, no proptosis, normal sclera and pupillary exam. Exam c/w preseptal cellulitis. No need for CT. No concern for orbital cellulitis.  Evelina Bucy, MD 12/24/13 [phone]

## 2013-12-25 ENCOUNTER — Other Ambulatory Visit: Payer: Self-pay | Admitting: *Deleted

## 2013-12-25 MED ORDER — CARVEDILOL 12.5 MG PO TABS: ORAL_TABLET | ORAL | Status: DC

## 2013-12-27 ENCOUNTER — Other Ambulatory Visit: Payer: Self-pay | Admitting: *Deleted

## 2013-12-31 ENCOUNTER — Emergency Department (HOSPITAL_COMMUNITY)
Admission: EM | Admit: 2013-12-31 | Discharge: 2013-12-31 | Disposition: A | Discharge status: Home / Self Care | Payer: BC Managed Care – PPO | Attending: Emergency Medicine | Admitting: Emergency Medicine

## 2013-12-31 ENCOUNTER — Encounter (HOSPITAL_COMMUNITY): Payer: Self-pay | Admitting: Emergency Medicine

## 2013-12-31 DIAGNOSIS — L03211 Cellulitis of face: Secondary | ICD-10-CM

## 2013-12-31 DIAGNOSIS — Z792 Long term (current) use of antibiotics: Secondary | ICD-10-CM | POA: Insufficient documentation

## 2013-12-31 DIAGNOSIS — E119 Type 2 diabetes mellitus without complications: Secondary | ICD-10-CM | POA: Diagnosis not present

## 2013-12-31 DIAGNOSIS — E039 Hypothyroidism, unspecified: Secondary | ICD-10-CM | POA: Diagnosis not present

## 2013-12-31 DIAGNOSIS — Z87891 Personal history of nicotine dependence: Secondary | ICD-10-CM | POA: Diagnosis not present

## 2013-12-31 DIAGNOSIS — I10 Essential (primary) hypertension: Secondary | ICD-10-CM | POA: Insufficient documentation

## 2013-12-31 DIAGNOSIS — L0201 Cutaneous abscess of face: Secondary | ICD-10-CM | POA: Insufficient documentation

## 2013-12-31 DIAGNOSIS — Z79899 Other long term (current) drug therapy: Secondary | ICD-10-CM | POA: Diagnosis not present

## 2013-12-31 HISTORY — DX: Cellulitis, unspecified: L03.90

## 2013-12-31 LAB — CBC WITH DIFFERENTIAL/PLATELET
Basophils Absolute: 0 10*3/uL (ref 0.0–0.1)
Basophils Relative: 0 % (ref 0–1)
EOS PCT: 2 % (ref 0–5)
Eosinophils Absolute: 0.1 10*3/uL (ref 0.0–0.7)
HCT: 41.2 % (ref 36.0–46.0)
Hemoglobin: 13.8 g/dL (ref 12.0–15.0)
LYMPHS ABS: 2.6 10*3/uL (ref 0.7–4.0)
LYMPHS PCT: 43 % (ref 12–46)
MCH: 30.5 pg (ref 26.0–34.0)
MCHC: 33.5 g/dL (ref 30.0–36.0)
MCV: 91.2 fL (ref 78.0–100.0)
Monocytes Absolute: 0.4 10*3/uL (ref 0.1–1.0)
Monocytes Relative: 7 % (ref 3–12)
NEUTROS PCT: 48 % (ref 43–77)
Neutro Abs: 2.8 10*3/uL (ref 1.7–7.7)
Platelets: 257 10*3/uL (ref 150–400)
RBC: 4.52 MIL/uL (ref 3.87–5.11)
RDW: 12.5 % (ref 11.5–15.5)
WBC: 6 10*3/uL (ref 4.0–10.5)

## 2013-12-31 LAB — COMPREHENSIVE METABOLIC PANEL
ALK PHOS: 109 U/L (ref 39–117)
ALT: 42 U/L — ABNORMAL HIGH (ref 0–35)
AST: 22 U/L (ref 0–37)
Albumin: 4.3 g/dL (ref 3.5–5.2)
Anion gap: 15 (ref 5–15)
BUN: 11 mg/dL (ref 6–23)
CO2: 24 meq/L (ref 19–32)
Calcium: 9.9 mg/dL (ref 8.4–10.5)
Chloride: 99 mEq/L (ref 96–112)
Creatinine, Ser: 0.73 mg/dL (ref 0.50–1.10)
GFR calc non Af Amer: >90 mL/min (ref >90)
GLUCOSE: 210 mg/dL — AB (ref 70–99)
Potassium: 4.1 mEq/L (ref 3.7–5.3)
Sodium: 138 mEq/L (ref 137–147)
Total Bilirubin: 0.3 mg/dL (ref 0.3–1.2)
Total Protein: 7.7 g/dL (ref 6.0–8.3)

## 2013-12-31 MED ORDER — VALACYCLOVIR HCL 1 G PO TABS: 1000.0000 mg | ORAL_TABLET | Freq: Three times a day (TID) | ORAL | Status: AC

## 2013-12-31 MED ORDER — OXYCODONE-ACETAMINOPHEN 5-325 MG PO TABS: 1.0000 | ORAL_TABLET | ORAL | Status: DC | PRN

## 2013-12-31 MED ORDER — SODIUM CHLORIDE 0.9 % IV SOLN
INTRAVENOUS | Status: DC
  Administered 2013-12-31: 15:00:00 via INTRAVENOUS

## 2013-12-31 MED ORDER — DOXYCYCLINE HYCLATE 100 MG PO CAPS: 100.0000 mg | ORAL_CAPSULE | Freq: Two times a day (BID) | ORAL | Status: DC

## 2013-12-31 MED ORDER — VANCOMYCIN HCL IN DEXTROSE 1-5 GM/200ML-% IV SOLN
1000.0000 mg | Freq: Once | INTRAVENOUS | Status: AC
  Administered 2013-12-31: 1000 mg via INTRAVENOUS
  Filled 2013-12-31: qty 200

## 2013-12-31 MED ORDER — VALACYCLOVIR HCL 1 G PO TABS: 1000.0000 mg | ORAL_TABLET | Freq: Three times a day (TID) | ORAL | Status: DC

## 2013-12-31 MED ORDER — OXYCODONE-ACETAMINOPHEN 5-325 MG PO TABS
2.0000 | ORAL_TABLET | Freq: Once | ORAL | Status: AC
  Administered 2013-12-31: 2 via ORAL
  Filled 2013-12-31: qty 2

## 2013-12-31 NOTE — ED Notes (Signed)
Pt from home c/o right sided cellulitis to face. She was seen her 8 days ago for same. She was given Cleocin and was told to return if she got worse. She reports that it got better but if getting worse now. Some swelling noted and painful to touch. She took 1000mg  of Tylenol 1200 today for pain and fever.

## 2013-12-31 NOTE — ED Provider Notes (Signed)
CSN: [id number]     Arrival date & time 12/31/13  1222 History   First MD Initiated Contact with Patient 12/31/13 1434     Chief Complaint  Patient presents with  . cellulitis of face      (Consider location/radiation/quality/duration/timing/severity/associated sxs/prior Treatment) HPI Comments: Patient here complaining of worsening right-sided facial swelling with fever at home up to 102. Was treated recently for cellulitis of clindamycin which she is currently still taking. Denies any ocular involvement. No vomiting noted. Extremities the face but no numbness. Pain characterized as sharp. Symptoms have been worse since yesterday. Nothing makes them better. No other rashes noted.  The history is provided by the patient.    Past Medical History  Diagnosis Date  . HYPOTHYROIDISM   . DM   . HYPERCHOLESTEROLEMIA   . HYPERLIPIDEMIA   . HYPERTENSION   . SUPRAVENTRICULAR TACHYCARDIA   . DYSPNEA   . CHEST PAIN   . Cellulitis    Past Surgical History  Procedure Laterality Date  . Cesarean section    . Tubal ligation    . Neck surgery    . Foot surgery    . Tonsillectomy    . Nasal septum surgery     Family History  Problem Relation Age of Onset  . Hypertension Mother    History  Substance Use Topics  . Smoking status: Former Smoker -- 15 years    Types: Cigarettes    Quit date: 11/23/1997  . Smokeless tobacco: Never Used  . Alcohol Use: No   OB History   Grav Para Term Preterm Abortions TAB SAB Ect Mult Living                 Review of Systems  All other systems reviewed and are negative.     Allergies  Zocor and Sulfa drugs cross reactors  Home Medications   Prior to Admission medications   Medication Sig Start Date End Date Taking? Authorizing Provider  ABILIFY 15 MG tablet Take 1 tablet by mouth Daily. 08/10/11   Historical Provider, MD  ALBUTEROL IN Inhale into the lungs every 4 (four) hours as needed.    Historical Provider, MD  carvedilol (COREG) 12.5  MG tablet TAKE 1 TABLET TWICE DAILY  WITH MEALS 12/25/13   Josue Hector, MD  clindamycin (CLEOCIN) 150 MG capsule Take 2 capsules (300 mg total) by mouth 3 (three) times daily. May dispense as 150mg  capsules 12/23/13   Elwyn Lade, PA-C  LamoTRIgine 300 MG TB24 Take 300 mg by mouth daily.    Historical Provider, MD  levothyroxine (SYNTHROID, LEVOTHROID) 100 MCG tablet Take 100 mcg by mouth at bedtime.     Historical Provider, MD  LORazepam (ATIVAN) 0.5 MG tablet Take 1 tablet by mouth every 6 (six) hours as needed for anxiety.  08/26/11   Historical Provider, MD  metFORMIN (GLUCOPHAGE) 500 MG tablet Take 500-1,000 mg by mouth 2 (two) times daily with a meal. 2 in the am, 1 in pm 07/25/11   Historical Provider, MD  oxyCODONE-acetaminophen (PERCOCET/ROXICET) 5-325 MG per tablet Take 1 tablet by mouth every 6 (six) hours as needed for moderate pain.  11/27/12   Venetia Maxon Rama, MD  polycarbophil (FIBERCON) 625 MG tablet Take 625 mg by mouth daily as needed for severe constipation.    Historical Provider, MD  QUEtiapine (SEROQUEL XR) 400 MG 24 hr tablet Take 400 mg by mouth at bedtime.    Historical Provider, MD  ranitidine (ZANTAC) 150 MG tablet  Take 150 mg by mouth daily as needed for heartburn.    Historical Provider, MD   BP 137/68  Pulse 86  Temp(Src) 99 F (37.2 C) (Oral)  Resp 16  Ht 5' 9.5" (1.765 m)  Wt 252 lb (114.306 kg)  BMI 36.69 kg/m2  SpO2 95% Physical Exam  Nursing note and vitals reviewed. Constitutional: She is oriented to person, place, and time. She appears well-developed and well-nourished.  Non-toxic appearance. No distress.  HENT:  Head: Normocephalic and atraumatic.    Patient notes tingling and burning to palpation on the right side of her face  Eyes: Conjunctivae, EOM and lids are normal. Pupils are equal, round, and reactive to light.  Neck: Normal range of motion. Neck supple. No tracheal deviation present. No mass present.  Cardiovascular: Normal rate,  regular rhythm and normal heart sounds.  Exam reveals no gallop.   No murmur heard. Pulmonary/Chest: Effort normal and breath sounds normal. No stridor. No respiratory distress. She has no decreased breath sounds. She has no wheezes. She has no rhonchi. She has no rales.  Abdominal: Soft. Normal appearance and bowel sounds are normal. She exhibits no distension. There is no tenderness. There is no rebound and no CVA tenderness.  Musculoskeletal: Normal range of motion. She exhibits no edema and no tenderness.  Neurological: She is alert and oriented to person, place, and time. She has normal strength. No cranial nerve deficit or sensory deficit. GCS eye subscore is 4. GCS verbal subscore is 5. GCS motor subscore is 6.  Skin: Skin is warm and dry. No abrasion and no rash noted.  Psychiatric: She has a normal mood and affect. Her speech is normal and behavior is normal.    ED Course  Procedures (including critical care time) Labs Review Labs Reviewed  COMPREHENSIVE METABOLIC PANEL - Abnormal; Notable for the following:    Glucose, Bld 210 (*)    ALT 42 (*)    All other components within normal limits  CBC WITH DIFFERENTIAL    Imaging Review No results found.   EKG Interpretation None      MDM   Final diagnoses:  None   Patient will be given dose of IV vancomycin and Percocet for pain. I will switch patient's medications to doxycycline and have her stop taking clindamycin. Concerned this might be early herpes zoster place her on Valtrex as well. She has no evidence of ocular involvement at this time.    Leota Jacobsen, MD 12/31/13 1452

## 2013-12-31 NOTE — Discharge Instructions (Signed)

## 2014-02-18 ENCOUNTER — Other Ambulatory Visit: Payer: Self-pay | Admitting: Cardiovascular Disease

## 2014-02-22 ENCOUNTER — Other Ambulatory Visit: Payer: Self-pay | Admitting: *Deleted

## 2014-02-22 ENCOUNTER — Other Ambulatory Visit: Payer: Self-pay

## 2014-02-22 MED ORDER — CARVEDILOL 12.5 MG PO TABS: ORAL_TABLET | ORAL | Status: DC

## 2014-02-24 ENCOUNTER — Other Ambulatory Visit: Payer: Self-pay

## 2014-02-24 MED ORDER — CARVEDILOL 12.5 MG PO TABS: ORAL_TABLET | ORAL | Status: DC

## 2014-04-06 NOTE — Progress Notes (Signed)
Patient ID: Veronica Rivers, female   DOB: Aug 24, 1965, 48 y.o.   MRN: [id number] Veronica Rivers is seen today for f.u of HTN, elevated lipids atypicdal chest pain history of SVT. She has been doing well. Some vetigo on antivert . She eats too many carbs and we discussed her diet and weight loss at length. She had an normal echo in 2009. She is not having recurrent palpitations or SSCP. She has been compliant with her meds. She had an LDL of 64 at her primaries recently with normal LFT's and TSH  Over the last 4 /15  has had marked increase in dyspnea. Primary questioned asthma/allergies but inhalers, flonase have not helped. No LE edema PND or orthopnea but significant exrtional dyspnea. No calf pain. Chest gets tight with dyspnea no audible wheezing. No fever cough or sputum She continues to be overweight A1c about 7 She has thyroid issues and synthroid dose increased 1/15   Echo 08/12/13 normal EF 60-65%  Mild TR unable to assess PA pressure  Cardiopulmonary stress test ordered but never done    ROS: Denies fever, malais, weight loss, blurry vision, decreased visual acuity, cough, sputum, SOB, hemoptysis, pleuritic pain, palpitaitons, heartburn, abdominal pain, melena, lower extremity edema, claudication, or rash.  All other systems reviewed and negative  General: Affect appropriate Healthy:  appears stated age 5: normal Neck supple with no adenopathy JVP normal no bruits no thyromegaly Lungs clear with no wheezing and good diaphragmatic motion Heart:  S1/S2 no murmur, no rub, gallop or click PMI normal Abdomen: benighn, BS positve, no tenderness, no AAA no bruit.  No HSM or HJR Distal pulses intact with no bruits No edema Neuro non-focal Skin warm and dry No muscular weakness   Current Outpatient Prescriptions  Medication Sig Dispense Refill  . ABILIFY 15 MG tablet Take 1 tablet by mouth Daily.    [name] acetaminophen (TYLENOL) 325 MG tablet Take 325 mg by mouth every 6 (six) hours as needed  for moderate pain.    [name] albuterol (PROVENTIL HFA;VENTOLIN HFA) 108 (90 BASE) MCG/ACT inhaler Inhale 1-2 puffs into the lungs every 4 (four) hours as needed for wheezing or shortness of breath.    . carvedilol (COREG) 12.5 MG tablet TAKE 1 TABLET TWICE DAILY  WITH MEALS 180 tablet 1  . doxycycline (VIBRAMYCIN) 100 MG capsule Take 1 capsule (100 mg total) by mouth 2 (two) times daily. 20 capsule 0  . LamoTRIgine 300 MG TB24 Take 300 mg by mouth daily.    [name] levothyroxine (SYNTHROID, LEVOTHROID) 100 MCG tablet Take 100 mcg by mouth at bedtime.     [name] LORazepam (ATIVAN) 0.5 MG tablet Take 1 tablet by mouth every 6 (six) hours as needed for anxiety.     . metFORMIN (GLUCOPHAGE) 500 MG tablet Take 500-1,000 mg by mouth 2 (two) times daily with a meal. 2 in the am, 1 in pm    . oxyCODONE-acetaminophen (PERCOCET/ROXICET) 5-325 MG per tablet Take 1 tablet by mouth every 6 (six) hours as needed for moderate pain.     [name] oxyCODONE-acetaminophen (PERCOCET/ROXICET) 5-325 MG per tablet Take 1-2 tablets by mouth every 4 (four) hours as needed for severe pain. 15 tablet 0  . polycarbophil (FIBERCON) 625 MG tablet Take 625 mg by mouth daily as needed for severe constipation.    . QUEtiapine (SEROQUEL XR) 400 MG 24 hr tablet Take 400 mg by mouth at bedtime.    . ranitidine (ZANTAC) 150 MG tablet Take 150 mg by mouth daily as  needed for heartburn.     No current facility-administered medications for this visit.    Allergies  Zocor and Sulfa drugs cross reactors  Electrocardiogram:  4/15  SR rate 98 poor R wave progression nonspecific lateral T Wave changes   Assessment and Plan

## 2014-04-07 ENCOUNTER — Encounter: Payer: BC Managed Care – PPO | Admitting: Cardiovascular Disease

## 2014-05-01 NOTE — Progress Notes (Signed)
Patient ID: Veronica Rivers, female   DOB: 06/13/1965, 48 y.o.   MRN: [id number] Veronica Rivers is seen today for f.u of HTN, elevated lipids atypicdal chest pain history of SVT. She has been doing well. Some vetigo on antivert . She eats too many carbs and we discussed her diet and weight loss at length. She had an normal echo in 2009. She is not having recurrent palpitations or SSCP. She has been compliant with her meds. She had an LDL of 64 at her primaries recently with normal LFT's and TSH  Seen 4/15 with dyspnea  CXR 08/07/13 NAD   Echo 08/12/13 essentially unremarkable  Study Conclusions  - Left ventricle: The cavity size was normal. There was mild concentric hypertrophy. Systolic function was normal. The estimated ejection fraction was in the range of 60% to 65%. Wall motion was normal; there were no regional wall motion abnormalities. Left ventricular diastolic function parameters were normal. - Left atrium: The atrium was normal in size. - Right ventricle: The cavity size was mildly dilated. Wall thickness was normal. Systolic function was normal. Lateral annulus peak S velocity: 13.2cm/s. - Right atrium: The atrium was mildly dilated. - Atrial septum: No defect or patent foramen ovale was identified. - Tricuspid valve: Poorly visualized. No significant regurgitation. Unable to calculate RVSP. - Inferior vena cava: The vessel was normal in size; the respirophasic diameter changes were in the normal range (= 50%); findings are consistent with normal central venous pressure. - Pericardium, extracardiac: There was no pericardial effusion.  ? Cardiopulmonary stress test not done    Reviewed labs from 04/21/14 TC 283  LDL 191  HDL 50    ROS: Denies fever, malais, weight loss, blurry vision, decreased visual acuity, cough, sputum, SOB, hemoptysis, pleuritic pain, palpitaitons, heartburn, abdominal pain, melena, lower extremity edema, claudication, or rash.  All other  systems reviewed and negative  General: Affect appropriate Healthy:  appears stated age 48: normal Neck supple with no adenopathy JVP normal no bruits no thyromegaly Lungs clear with no wheezing and good diaphragmatic motion Heart:  S1/S2 no murmur, no rub, gallop or click PMI normal Abdomen: benighn, BS positve, no tenderness, no AAA no bruit.  No HSM or HJR Distal pulses intact with no bruits No edema Neuro non-focal Skin warm and dry No muscular weakness   Current Outpatient Prescriptions  Medication Sig Dispense Refill  . ABILIFY 15 MG tablet Take 1 tablet by mouth Daily.    [name] acetaminophen (TYLENOL) 325 MG tablet Take 325 mg by mouth every 6 (six) hours as needed for moderate pain.    [name] albuterol (PROVENTIL HFA;VENTOLIN HFA) 108 (90 BASE) MCG/ACT inhaler Inhale 1-2 puffs into the lungs every 4 (four) hours as needed for wheezing or shortness of breath.    . carvedilol (COREG) 12.5 MG tablet TAKE 1 TABLET TWICE DAILY  WITH MEALS 180 tablet 1  . doxycycline (VIBRAMYCIN) 100 MG capsule Take 1 capsule (100 mg total) by mouth 2 (two) times daily. 20 capsule 0  . LamoTRIgine 300 MG TB24 Take 300 mg by mouth daily.    [name] levothyroxine (SYNTHROID, LEVOTHROID) 100 MCG tablet Take 100 mcg by mouth at bedtime.     [name] LORazepam (ATIVAN) 0.5 MG tablet Take 1 tablet by mouth every 6 (six) hours as needed for anxiety.     . metFORMIN (GLUCOPHAGE) 500 MG tablet Take 500-1,000 mg by mouth 2 (two) times daily with a meal. 2 in the am, 1 in pm    . oxyCODONE-acetaminophen (  PERCOCET/ROXICET) 5-325 MG per tablet Take 1 tablet by mouth every 6 (six) hours as needed for moderate pain.     [name] oxyCODONE-acetaminophen (PERCOCET/ROXICET) 5-325 MG per tablet Take 1-2 tablets by mouth every 4 (four) hours as needed for severe pain. 15 tablet 0  . polycarbophil (FIBERCON) 625 MG tablet Take 625 mg by mouth daily as needed for severe constipation.    . QUEtiapine (SEROQUEL XR) 400 MG 24 hr tablet Take 400 mg  by mouth at bedtime.    . ranitidine (ZANTAC) 150 MG tablet Take 150 mg by mouth daily as needed for heartburn.     No current facility-administered medications for this visit.    Allergies  Zocor and Sulfa drugs cross reactors  Electrocardiogram:  08/07/13  SR rate 98 poor R wave progression nonspecific ST/T wave changes   Assessment and Plan

## 2014-05-05 ENCOUNTER — Ambulatory Visit (INDEPENDENT_AMBULATORY_CARE_PROVIDER_SITE_OTHER): Payer: BC Managed Care – PPO | Admitting: Cardiovascular Disease

## 2014-05-05 ENCOUNTER — Encounter: Payer: Self-pay | Admitting: Cardiovascular Disease

## 2014-05-05 VITALS — BP 126/70 | HR 81 | Ht 69.5 in | Wt 258.0 lb

## 2014-05-05 DIAGNOSIS — E785 Hyperlipidemia, unspecified: Secondary | ICD-10-CM

## 2014-05-05 DIAGNOSIS — I10 Essential (primary) hypertension: Secondary | ICD-10-CM

## 2014-05-05 DIAGNOSIS — E119 Type 2 diabetes mellitus without complications: Secondary | ICD-10-CM

## 2014-05-05 MED ORDER — ATORVASTATIN CALCIUM 20 MG PO TABS
20.0000 mg | ORAL_TABLET | Freq: Every day | ORAL | Status: DC
Start: 2014-05-05 — End: 2014-10-27

## 2014-05-05 NOTE — Assessment & Plan Note (Signed)
Well controlled.  Continue current medications and low sodium Dash type diet.    

## 2014-05-05 NOTE — Patient Instructions (Addendum)
Your physician wants you to follow-up in: 6 months with Dr. Johnsie Cancel. You will receive a reminder letter in the mail two months in advance. If you don't receive a letter, please call our office to schedule the follow-up appointment.  Your physician has recommended you make the following change in your medication:  1) START Lipitor 20mg  daily  Please schedule appointment with LIPID CLINIC

## 2014-05-05 NOTE — Assessment & Plan Note (Signed)
Very high LDL  She indicates a rash wit lovastatin, simvastatin and crestor  Willing to try lipitor  Will arrange f/u in lipid clinic  If she tolerates lipitor they can titrate dose if not they can discuss PSK 9 drugs  She gives herself injections for DM and this would not be a big deal for her

## 2014-05-05 NOTE — Assessment & Plan Note (Signed)
Improved on victosa  Discussed low carb diet Still eating too much breach  F/U primary Target A1c for her less than 7

## 2014-05-15 ENCOUNTER — Ambulatory Visit (INDEPENDENT_AMBULATORY_CARE_PROVIDER_SITE_OTHER): Payer: BC Managed Care – PPO | Admitting: Pharmacist

## 2014-05-15 VITALS — Wt 254.5 lb

## 2014-05-15 DIAGNOSIS — E785 Hyperlipidemia, unspecified: Secondary | ICD-10-CM

## 2014-05-15 MED ORDER — ROSUVASTATIN CALCIUM 10 MG PO TABS: 10.0000 mg | ORAL_TABLET | ORAL | Status: DC

## 2014-05-15 NOTE — Progress Notes (Signed)
S/O: Veronica Rivers is a pleasant 49yo female referred to lipid clinic by Dr. Johnsie Cancel.  Pt has a PMH significant for HTN, HLD, and DM.  Pt has failed multiple statins in the past.  Pt reports rash with pain and redness to Atorva, Lova, and Simva.  Pt reports she was previously on Rosuva daily for ~39yr before discontinuing d/t "memory problems."  FH: mother had MI x 6, first MI at 31yo  Diet: Previously on [employer] Loss over the summer, reported 20+ lbs wt loss, has kept wt off since D/C diet Primarily drinks water, diet soda 2x/day    Breakfast - mostly cereal, some yogurt, meats/eggs 1-2x/week    Lunch - leftovers from dinner    Dinner - red meats 5x/week, lean meats 2x/week    Snacks - popcorn, crackers + dips, limited sweets d/t DM  Exercise: 4min walking daily, about to begin belly dancing class  A1c pt reported 7.7% 02/2014 >> started Victoza since then  Lipid panel 04/2014 (no HLD meds): TC: 283 TG: 213 HDL: 50 LDL (calc): 191 (goal <70)  A/P: Veronica Rivers has uncontrolled HLD and has failed multiple statins in the past - LDL goal <70 d/t DM and significant FH.  Pt is willing to try low dose Rosuva twice weekly and consider increasing frequency as tolerated, she will monitor for any sort of "memory problems" with this low frequency and notify us if she's not able to continue the Rosuva for any reason.  -Start Crestor 10mg  twice weekly -Switch to more lean meats for dinner instead of red meats -Try to increase dietary fiber through your morning cereal and snacks -Check FLP and LFTs in 6-8 weeks -F/U in lipid clinic 1 week after labs -Future plans may to be to increase Crestor as tolerated, adding Zetia, then adding PCSK-9 inhibitor as necessary based on lipid panel  Drucie Opitz, PharmD Clinical Pharmacy Resident Pager: [phone]

## 2014-05-15 NOTE — Patient Instructions (Signed)
It was nice meeting you today.  -Start taking Crestor 10mg  twice weekly -Decrease red meats and increase more lean meats (chicken, [place], fish) -Check fasting cholesterol panel and liver function in 6-8 weeks -Follow-up in lipid clinic one week after checking labs  Let us know if you have any problems with the Crestor or if you have any further questions. [phone]

## 2014-05-20 NOTE — Addendum Note (Signed)
Addended by: Elberta Leatherwood R on: 05/20/2014 01:44 PM   Modules accepted: Orders

## 2014-06-30 ENCOUNTER — Other Ambulatory Visit: Payer: Self-pay

## 2014-07-02 ENCOUNTER — Other Ambulatory Visit (INDEPENDENT_AMBULATORY_CARE_PROVIDER_SITE_OTHER): Payer: BLUE CROSS/BLUE SHIELD | Admitting: *Deleted

## 2014-07-02 DIAGNOSIS — E785 Hyperlipidemia, unspecified: Secondary | ICD-10-CM

## 2014-07-02 LAB — LIPID PANEL
CHOLESTEROL: 160 mg/dL (ref 0–200)
HDL: 53.6 mg/dL (ref >39.00)
LDL CALC: 70 mg/dL (ref 0–99)
NonHDL: 106.4
Total CHOL/HDL Ratio: 3
Triglycerides: 181 mg/dL — ABNORMAL HIGH (ref 0.0–149.0)
VLDL: 36.2 mg/dL (ref 0.0–40.0)

## 2014-07-02 LAB — HEPATIC FUNCTION PANEL
ALT: 33 U/L (ref 0–35)
AST: 19 U/L (ref 0–37)
Albumin: 4.2 g/dL (ref 3.5–5.2)
Alkaline Phosphatase: 88 U/L (ref 39–117)
BILIRUBIN DIRECT: 0.1 mg/dL (ref 0.0–0.3)
TOTAL PROTEIN: 6.7 g/dL (ref 6.0–8.3)
Total Bilirubin: 0.3 mg/dL (ref 0.2–1.2)

## 2014-07-03 ENCOUNTER — Ambulatory Visit: Payer: BC Managed Care – PPO | Admitting: Pharmacist

## 2014-07-04 ENCOUNTER — Ambulatory Visit (INDEPENDENT_AMBULATORY_CARE_PROVIDER_SITE_OTHER): Payer: BLUE CROSS/BLUE SHIELD | Admitting: Pharmacist

## 2014-07-04 VITALS — Wt 255.8 lb

## 2014-07-04 DIAGNOSIS — E785 Hyperlipidemia, unspecified: Secondary | ICD-10-CM | POA: Diagnosis not present

## 2014-07-04 NOTE — Patient Instructions (Signed)
It was a pleasure to meet you today!  Continue taking Crestor 10mg  twice weekly and implementing your new diet and exercise regimens. Please let us know if you develop any symptoms or intolerances.  We will recheck your fasting lipid panel at your follow up visit with Dr. Johnsie Cancel in about 6 months.

## 2014-07-04 NOTE — Progress Notes (Signed)
S/O: Veronica Rivers is a pleasant 49yo female referred to lipid clinic by Dr. Johnsie Cancel.  Pt has a PMH significant for HTN, HLD, and DM.  Pt has failed multiple statins in the past.  Pt reports rash with pain and redness to atorvastatin, lovastatin, and simvastatin.  Pt reports she was previously on Crestor daily for ~59yr before discontinuing d/t "memory problems."  FH: mother had MI x 6, first MI at 40yo  Diet: has transitioned to eating lean proteins and vegetables and cutting out carbohydrates and sugars. She reports that her diet has also helped with her DM and that she no longer requires the Invokamet.  Exercise: 39min walking daily, will try to increase once weather gets nicer  Lipid panel: 07/02/14 (Crestor 10mg  twice weekly) TC: 160 TG: 181 HDL: 53.6 LDL: 70  04/2014 (no HLD meds): TC: 283 TG: 213 HDL: 50 LDL (calc): 191 (goal <70)  Current Outpatient Prescriptions  Medication Sig Dispense Refill  . ABILIFY 15 MG tablet Take 1 tablet by mouth Daily.    [name] albuterol (PROVENTIL HFA;VENTOLIN HFA) 108 (90 BASE) MCG/ACT inhaler Inhale 1-2 puffs into the lungs every 4 (four) hours as needed for wheezing or shortness of breath.    [name] atorvastatin (LIPITOR) 20 MG tablet Take 1 tablet (20 mg total) by mouth daily. 30 tablet 3  . carvedilol (COREG) 12.5 MG tablet TAKE 1 TABLET TWICE DAILY  WITH MEALS 180 tablet 1  . cetirizine (ZYRTEC) 10 MG tablet Take 10 mg by mouth daily.    . fluticasone (FLONASE) 50 MCG/ACT nasal spray Place 2 sprays into both nostrils daily.    [name] gabapentin (NEURONTIN) 300 MG capsule Take 1 capsule by mouth 3 (three) times daily.  0  . INVOKAMET 50-1000 MG TABS Take 2 tablets by mouth daily.    . LamoTRIgine 300 MG TB24 Take 300 mg by mouth daily.    [name] levothyroxine (SYNTHROID, LEVOTHROID) 100 MCG tablet Take 100 mcg by mouth at bedtime.     . methocarbamol (ROBAXIN) 750 MG tablet Take 1 tablet by mouth every 8 (eight) hours as needed.    . montelukast (SINGULAIR) 10 MG  tablet Take 10 mg by mouth at bedtime.    [name] oxyCODONE-acetaminophen (PERCOCET/ROXICET) 5-325 MG per tablet Take 1 tablet by mouth every 6 (six) hours as needed for moderate pain.     [name] QUEtiapine (SEROQUEL XR) 400 MG 24 hr tablet Take 400 mg by mouth at bedtime.    . rosuvastatin (CRESTOR) 10 MG tablet Take 1 tablet (10 mg total) by mouth 2 (two) times a week. 15 tablet 3  . VICTOZA 18 MG/3ML SOPN Inject 1.2 mg as directed daily.     No current facility-administered medications for this visit.   Allergies  Allergen Reactions  . Zocor [Simvastatin]     Caused pain and hot flashes  . Sulfa Drugs Cross Reactors Rash     A/P: Veronica Rivers is currently taking Crestor 10mg  twice weekly and has made significant diet changes since her last visit. Her most recent lipid panel is at goal of LDL <70. She will continue to implement her diet and exercise changes as well as taking Crestor 10mg  twice weekly. We will recheck a fasting lipid panel at her follow up visit with Dr. Johnsie Cancel in about 6 months.

## 2014-07-18 ENCOUNTER — Ambulatory Visit
Admission: RE | Admit: 2014-07-18 | Discharge: 2014-07-18 | Disposition: A | Discharge status: Home / Self Care | Payer: BLUE CROSS/BLUE SHIELD | Source: Ambulatory Visit | Attending: Family Medicine | Admitting: Family Medicine

## 2014-07-18 ENCOUNTER — Other Ambulatory Visit: Payer: Self-pay | Admitting: Family Medicine

## 2014-07-18 DIAGNOSIS — R05 Cough: Secondary | ICD-10-CM

## 2014-07-18 DIAGNOSIS — R059 Cough, unspecified: Secondary | ICD-10-CM

## 2014-07-18 DIAGNOSIS — R0602 Shortness of breath: Secondary | ICD-10-CM

## 2014-07-26 ENCOUNTER — Other Ambulatory Visit: Payer: Self-pay | Admitting: Cardiovascular Disease

## 2014-08-11 ENCOUNTER — Emergency Department (HOSPITAL_COMMUNITY)
Admission: EM | Admit: 2014-08-11 | Discharge: 2014-08-11 | Disposition: A | Discharge status: Home / Self Care | Payer: BLUE CROSS/BLUE SHIELD | Attending: Emergency Medicine | Admitting: Emergency Medicine

## 2014-08-11 ENCOUNTER — Encounter (HOSPITAL_COMMUNITY): Payer: Self-pay | Admitting: Emergency Medicine

## 2014-08-11 ENCOUNTER — Emergency Department (HOSPITAL_COMMUNITY): Payer: BLUE CROSS/BLUE SHIELD

## 2014-08-11 DIAGNOSIS — E039 Hypothyroidism, unspecified: Secondary | ICD-10-CM | POA: Diagnosis not present

## 2014-08-11 DIAGNOSIS — Y9289 Other specified places as the place of occurrence of the external cause: Secondary | ICD-10-CM | POA: Insufficient documentation

## 2014-08-11 DIAGNOSIS — Y9389 Activity, other specified: Secondary | ICD-10-CM | POA: Diagnosis not present

## 2014-08-11 DIAGNOSIS — S81012A Laceration without foreign body, left knee, initial encounter: Secondary | ICD-10-CM | POA: Diagnosis present

## 2014-08-11 DIAGNOSIS — E119 Type 2 diabetes mellitus without complications: Secondary | ICD-10-CM | POA: Insufficient documentation

## 2014-08-11 DIAGNOSIS — Z87891 Personal history of nicotine dependence: Secondary | ICD-10-CM | POA: Insufficient documentation

## 2014-08-11 DIAGNOSIS — Z7951 Long term (current) use of inhaled steroids: Secondary | ICD-10-CM | POA: Diagnosis not present

## 2014-08-11 DIAGNOSIS — W19XXXA Unspecified fall, initial encounter: Secondary | ICD-10-CM

## 2014-08-11 DIAGNOSIS — E785 Hyperlipidemia, unspecified: Secondary | ICD-10-CM | POA: Diagnosis not present

## 2014-08-11 DIAGNOSIS — Y998 Other external cause status: Secondary | ICD-10-CM | POA: Insufficient documentation

## 2014-08-11 DIAGNOSIS — Z79899 Other long term (current) drug therapy: Secondary | ICD-10-CM | POA: Diagnosis not present

## 2014-08-11 DIAGNOSIS — I10 Essential (primary) hypertension: Secondary | ICD-10-CM | POA: Insufficient documentation

## 2014-08-11 DIAGNOSIS — S61214A Laceration without foreign body of right ring finger without damage to nail, initial encounter: Secondary | ICD-10-CM | POA: Insufficient documentation

## 2014-08-11 DIAGNOSIS — IMO0002 Reserved for concepts with insufficient information to code with codable children: Secondary | ICD-10-CM

## 2014-08-11 DIAGNOSIS — Z23 Encounter for immunization: Secondary | ICD-10-CM | POA: Diagnosis not present

## 2014-08-11 DIAGNOSIS — Z872 Personal history of diseases of the skin and subcutaneous tissue: Secondary | ICD-10-CM | POA: Diagnosis not present

## 2014-08-11 DIAGNOSIS — E780 Pure hypercholesterolemia: Secondary | ICD-10-CM | POA: Insufficient documentation

## 2014-08-11 DIAGNOSIS — W010XXA Fall on same level from slipping, tripping and stumbling without subsequent striking against object, initial encounter: Secondary | ICD-10-CM | POA: Diagnosis not present

## 2014-08-11 MED ORDER — HYDROCODONE-ACETAMINOPHEN 5-325 MG PO TABS
1.0000 | ORAL_TABLET | Freq: Once | ORAL | Status: AC
  Administered 2014-08-11: 1 via ORAL
  Filled 2014-08-11: qty 1

## 2014-08-11 MED ORDER — LIDOCAINE HCL (PF) 1 % IJ SOLN
5.0000 mL | Freq: Once | INTRAMUSCULAR | Status: AC
  Administered 2014-08-11: 5 mL via INTRADERMAL
  Filled 2014-08-11: qty 5

## 2014-08-11 MED ORDER — CEPHALEXIN 500 MG PO CAPS: 500.0000 mg | ORAL_CAPSULE | Freq: Four times a day (QID) | ORAL | Status: DC

## 2014-08-11 MED ORDER — LIDOCAINE-EPINEPHRINE-TETRACAINE (LET) SOLUTION
3.0000 mL | Freq: Once | NASAL | Status: AC
  Administered 2014-08-11: 3 mL via TOPICAL
  Filled 2014-08-11: qty 3

## 2014-08-11 MED ORDER — NAPROXEN 500 MG PO TABS
500.0000 mg | ORAL_TABLET | Freq: Once | ORAL | Status: AC
  Administered 2014-08-11: 500 mg via ORAL
  Filled 2014-08-11: qty 1

## 2014-08-11 MED ORDER — TETANUS-DIPHTH-ACELL PERTUSSIS 5-2.5-18.5 LF-MCG/0.5 IM SUSP
0.5000 mL | Freq: Once | INTRAMUSCULAR | Status: AC
  Administered 2014-08-11: 0.5 mL via INTRAMUSCULAR
  Filled 2014-08-11: qty 0.5

## 2014-08-11 MED ORDER — NAPROXEN 500 MG PO TABS: 500.0000 mg | ORAL_TABLET | Freq: Two times a day (BID) | ORAL | Status: DC

## 2014-08-11 MED ORDER — HYDROCODONE-ACETAMINOPHEN 5-325 MG PO TABS: 1.0000 | ORAL_TABLET | ORAL | Status: DC | PRN

## 2014-08-11 MED ORDER — BACITRACIN ZINC 500 UNIT/GM EX OINT
TOPICAL_OINTMENT | CUTANEOUS | Status: AC
  Administered 2014-08-11: 1
  Filled 2014-08-11: qty 0.9

## 2014-08-11 MED ORDER — BACITRACIN 500 UNIT/GM EX OINT
1.0000 "application " | TOPICAL_OINTMENT | Freq: Once | CUTANEOUS | Status: AC
  Administered 2014-08-11: 1 via TOPICAL

## 2014-08-11 NOTE — ED Notes (Signed)
Pt tripped on root today, laceration present to right middle finger and significant abrasion present to left knee.

## 2014-08-11 NOTE — Discharge Instructions (Signed)
Please follow directions provided. Be sure to follow-up with your primary care doctor in 7 days for wound recheck and have your stitches removed. Keep wound clean and dry and change her dressing daily using antibiotic ointment every dressing change. Take the naproxen twice a day to help with pain and inflammation. You may take the Vicodin for pain not relieved by the naproxen. Don't hesitate to return for any new, worsening, or concerning symptoms.   SEEK IMMEDIATE MEDICAL CARE IF:  Your pain is not controlled with prescribed medicine.  You have severe swelling around the wound causing pain and numbness or a change in color in your arm, hand, leg, or foot.  Your wound splits open and starts bleeding.  You have worsening numbness, weakness, or loss of function of any joint around or beyond the wound.  You develop painful lumps near the wound or on the skin anywhere on your body.

## 2014-08-11 NOTE — ED Notes (Signed)
EDNP at bedside for lac repair

## 2014-08-11 NOTE — ED Provider Notes (Signed)
CSN: [id number]     Arrival date & time 08/11/14  1137 History  This chart was scribed for non-physician practitioner, Britt Bottom, working with Charlesetta Shanks, MD by Molli Posey, ED Scribe. This patient was seen in room [place] and the patient's care was started at 12:21 PM.    Chief Complaint  Patient presents with  . Fall   The history is provided by the patient. No language interpreter was used.   HPI Comments: Veronica Rivers is a 49 y.o. female with a history of hypothyroidism, DM, HTN and hyperlipidemia who presents to the Emergency Department complaining of fall that occurred about 1.5 hours ago. Pt states she was walking on a trail and tripped on root and fell onto her left knee and right hand. She denies she fell due to any dizziness or LOC. She complains of a laceration to her left knee and her right 3rd finger at this time. Pt rates her pain as a 5/10 at this time and states that it was a 7/10 at its worst. She states she is unsure if she is UTD on tetanus. Pt reports a history of uterine ablation. Pt reports NKDA. She reports no alleviating or modifying factors at this time.    Past Medical History  Diagnosis Date  . HYPOTHYROIDISM   . DM   . HYPERCHOLESTEROLEMIA   . HYPERLIPIDEMIA   . HYPERTENSION   . SUPRAVENTRICULAR TACHYCARDIA   . DYSPNEA   . CHEST PAIN   . Cellulitis    Past Surgical History  Procedure Laterality Date  . Cesarean section    . Tubal ligation    . Neck surgery    . Foot surgery    . Tonsillectomy    . Nasal septum surgery     Family History  Problem Relation Age of Onset  . Hypertension Mother    History  Substance Use Topics  . Smoking status: Former Smoker -- 15 years    Types: Cigarettes    Quit date: 11/23/1997  . Smokeless tobacco: Never Used  . Alcohol Use: No   OB History    No data available     Review of Systems  Skin: Positive for wound.  Neurological: Negative for dizziness and syncope.  All other systems  reviewed and are negative.   Allergies  Zocor and Sulfa drugs cross reactors  Home Medications   Prior to Admission medications   Medication Sig Start Date End Date Taking? Authorizing Provider  ABILIFY 15 MG tablet Take 1 tablet by mouth Daily. 08/10/11   Historical Provider, MD  albuterol (PROVENTIL HFA;VENTOLIN HFA) 108 (90 BASE) MCG/ACT inhaler Inhale 1-2 puffs into the lungs every 4 (four) hours as needed for wheezing or shortness of breath.    Historical Provider, MD  atorvastatin (LIPITOR) 20 MG tablet Take 1 tablet (20 mg total) by mouth daily. 05/05/14   Josue Hector, MD  carvedilol (COREG) 12.5 MG tablet TAKE 1 TABLET TWICE DAILY  WITH MEALS 02/24/14   Josue Hector, MD  carvedilol (COREG) 12.5 MG tablet TAKE 1 TABLET TWICE DAILY  WITH MEALS 07/28/14   Josue Hector, MD  cetirizine (ZYRTEC) 10 MG tablet Take 10 mg by mouth daily.    Historical Provider, MD  fluticasone (FLONASE) 50 MCG/ACT nasal spray Place 2 sprays into both nostrils daily.    Historical Provider, MD  gabapentin (NEURONTIN) 300 MG capsule Take 1 capsule by mouth 3 (three) times daily. 04/21/14   Historical Provider, MD  Claudine Mouton  50-1000 MG TABS Take 2 tablets by mouth daily. 04/22/14   Historical Provider, MD  LamoTRIgine 300 MG TB24 Take 300 mg by mouth daily.    Historical Provider, MD  levothyroxine (SYNTHROID, LEVOTHROID) 100 MCG tablet Take 100 mcg by mouth at bedtime.     Historical Provider, MD  methocarbamol (ROBAXIN) 750 MG tablet Take 1 tablet by mouth every 8 (eight) hours as needed. 03/31/14   Historical Provider, MD  montelukast (SINGULAIR) 10 MG tablet Take 10 mg by mouth at bedtime.    Historical Provider, MD  oxyCODONE-acetaminophen (PERCOCET/ROXICET) 5-325 MG per tablet Take 1 tablet by mouth every 6 (six) hours as needed for moderate pain.  11/27/12   Venetia Maxon Rama, MD  QUEtiapine (SEROQUEL XR) 400 MG 24 hr tablet Take 400 mg by mouth at bedtime.    Historical Provider, MD  rosuvastatin  (CRESTOR) 10 MG tablet Take 1 tablet (10 mg total) by mouth 2 (two) times a week. 05/15/14   Josue Hector, MD  VICTOZA 18 MG/3ML SOPN Inject 1.2 mg as directed daily. 04/25/14   Historical Provider, MD   BP 133/50 mmHg  Pulse 69  Temp(Src) 97.7 F (36.5 C) (Oral)  Resp 16  SpO2 94% Physical Exam  Constitutional: She is oriented to person, place, and time. She appears well-developed and well-nourished.  HENT:  Head: Normocephalic and atraumatic.  Eyes: Right eye exhibits no discharge. Left eye exhibits no discharge.  Neck: Neck supple. No tracheal deviation present.  Cardiovascular: Normal rate.   Pulmonary/Chest: Effort normal. No respiratory distress.  Abdominal: She exhibits no distension.  Musculoskeletal:  2-13mm lacunar incision or to distal tip of 3rd finger on her right hand. 5/5 strength with flexion and extension in finger. 5/5 strength with plantar and dorsiflexion of left foot. Multiple abrasions on anterior aspect of left lower leg, most significant at left anterior knee with 3 cm laceration across the patella. 5/5 strength in upper extremities, ecchymosis and tenderness to left lateral epicondyl.   Neurological: She is alert and oriented to person, place, and time.  Skin: Skin is warm and dry.  Psychiatric: She has a normal mood and affect. Her behavior is normal.  Nursing note and vitals reviewed.   ED Course  Procedures   LACERATION REPAIR Performed by: Britt Bottom Authorized by: Britt Bottom Consent: Verbal consent obtained. Risks and benefits: risks, benefits and alternatives were discussed Consent given by: patient Patient identity confirmed: provided demographic data Prepped and Draped in normal sterile fashion Wound explored  Laceration Location: left knee  Laceration Length: 3 cm  No Foreign Bodies seen or palpated  Anesthesia: local infiltration  Local anesthetic: lidocaine 1% without epinephrine  Anesthetic total: 5  ml  Irrigation method: syringe Amount of cleaning: standard  Skin closure: 4-0 prolene  Number of sutures: 5  Technique: simple interrupted  Patient tolerance: Patient tolerated the procedure well with no immediate complications.  DIAGNOSTIC STUDIES: Oxygen Saturation is 94% on RA, normal by my interpretation.    COORDINATION OF CARE: 12:27 PM Discussed treatment plan with pt at bedside and pt agreed to plan.   Labs Review Labs Reviewed - No data to display  Imaging Review Dg Elbow Complete Left  08/11/2014   CLINICAL DATA:  Tripping injury, left elbow pain, initial encounter.  EXAM: LEFT ELBOW - COMPLETE 3+ VIEW  COMPARISON:  None.  FINDINGS: No acute osseous or joint abnormality. Small exostosis is seen off the distal humeral meta diaphysis.  IMPRESSION: No acute osseous abnormality.  Electronically Signed   By: Lorin Picket M.D.   On: 08/11/2014 13:27   Dg Knee Complete 4 Views Left  08/11/2014   CLINICAL DATA:  Status post a fall with direct trauma the knee is well as cutaneous laceration  EXAM: LEFT KNEE - COMPLETE 4+ VIEW  COMPARISON:  Left knee series dated November 09, 2012  FINDINGS: The bones of the knee are adequately mineralized. There is beaking of the tibial spines. The joint spaces are preserved. There is a tiny spur from the superior margin of the patella. A small suprapatellar effusion is suspected. There is soft tissue swelling and contour deformity in the prepatellar region. The proximal fibula is intact.  IMPRESSION: There is no acute fracture nor dislocation of the left knee. Minimal osteoarthritic changes are present. There is a small suprapatellar effusion.   Electronically Signed   By: David  Martinique   On: 08/11/2014 13:03   Dg Finger Middle Right  08/11/2014   CLINICAL DATA:  Status post tripping injury and following with pain over the right middle finger  EXAM: RIGHT MIDDLE FINGER 2+V  COMPARISON:  None.  FINDINGS: The bones of the right middle finger are  adequately mineralized. There is no acute fracture. There is a small amount of soft tissue gas over the ventral aspect of the middle phalanx which may reflect the sequelae of the injury.  IMPRESSION: There is no acute bony abnormality of the right third or long finger.   Electronically Signed   By: David  Martinique   On: 08/11/2014 13:04     EKG Interpretation None      MDM   Final diagnoses:  Fall, initial encounter  Laceration   49 yo with superficial laceration to finger and laceration and abrasions to knee after tripping over tree root. Her xrays were negative for acute fracture. A Tdap booster was given. Pressure irrigation performed. Laceration occurred < 2 hours prior to repair which was well tolerated. Discussed suture home care w pt and suture removal in 7 days. Pt is well-appearing, in no acute distress and vital signs reviewed and not concerning. She appears safe to be discharged.  Discharge include follow-up with their PCP.  Return precautions provided. Pt aware of plan and in agreement.     I personally performed the services described in this documentation, which was scribed in my presence. The recorded information has been reviewed and is accurate.  Filed Vitals:   08/11/14 1150 08/11/14 1506  BP: 133/50 122/71  Pulse: 69 66  Temp: 97.7 F (36.5 C)   TempSrc: Oral   Resp: 16 18  SpO2: 94% 98%   Meds given in ED:  Medications  lidocaine-EPINEPHrine-tetracaine (LET) solution (3 mLs Topical Given 08/11/14 1211)  naproxen (NAPROSYN) tablet 500 mg (500 mg Oral Given 08/11/14 1234)  HYDROcodone-acetaminophen (NORCO/VICODIN) 5-325 MG per tablet 1 tablet (1 tablet Oral Given 08/11/14 1234)  Tdap (BOOSTRIX) injection 0.5 mL (0.5 mLs Intramuscular Given 08/11/14 1444)  lidocaine (PF) (XYLOCAINE) 1 % injection 5 mL (5 mLs Intradermal Given 08/11/14 1444)  bacitracin 500 UNIT/GM ointment (1 application  Given 06/16/29 1505)  bacitracin ointment 1 application (1 application Topical Given  08/11/14 1505)    Discharge Medication List as of 08/11/2014  1:39 PM    START taking these medications   Details  HYDROcodone-acetaminophen (NORCO/VICODIN) 5-325 MG per tablet Take 1 tablet by mouth every 4 (four) hours as needed., Starting 08/11/2014, Until Discontinued, Print    naproxen (NAPROSYN) 500 MG tablet Take 1  tablet (500 mg total) by mouth 2 (two) times daily., Starting 08/11/2014, Until Discontinued, Print            Britt Bottom, NP 08/13/14 2671  Charlesetta Shanks, MD 08/15/14 2110

## 2014-08-11 NOTE — ED Notes (Signed)
Wound care provided.

## 2014-08-11 NOTE — ED Notes (Signed)
Wound care provided, LET applied

## 2014-08-11 NOTE — ED Notes (Signed)
dermabond and lidocaine at bedside along with suture cart for EDNP

## 2014-08-14 ENCOUNTER — Inpatient Hospital Stay (HOSPITAL_COMMUNITY)
Admission: EM | Admit: 2014-08-14 | Discharge: 2014-08-17 | DRG: 603 | Disposition: A | Discharge status: Home / Self Care | Payer: BLUE CROSS/BLUE SHIELD | Attending: Internal Medicine | Admitting: Internal Medicine

## 2014-08-14 ENCOUNTER — Encounter (HOSPITAL_COMMUNITY): Payer: Self-pay | Admitting: Emergency Medicine

## 2014-08-14 DIAGNOSIS — Z79899 Other long term (current) drug therapy: Secondary | ICD-10-CM | POA: Diagnosis not present

## 2014-08-14 DIAGNOSIS — E785 Hyperlipidemia, unspecified: Secondary | ICD-10-CM | POA: Diagnosis present

## 2014-08-14 DIAGNOSIS — I10 Essential (primary) hypertension: Secondary | ICD-10-CM | POA: Diagnosis present

## 2014-08-14 DIAGNOSIS — Z87891 Personal history of nicotine dependence: Secondary | ICD-10-CM | POA: Diagnosis not present

## 2014-08-14 DIAGNOSIS — F3130 Bipolar disorder, current episode depressed, mild or moderate severity, unspecified: Secondary | ICD-10-CM

## 2014-08-14 DIAGNOSIS — E038 Other specified hypothyroidism: Secondary | ICD-10-CM | POA: Diagnosis not present

## 2014-08-14 DIAGNOSIS — L02419 Cutaneous abscess of limb, unspecified: Secondary | ICD-10-CM | POA: Diagnosis not present

## 2014-08-14 DIAGNOSIS — L03116 Cellulitis of left lower limb: Principal | ICD-10-CM | POA: Diagnosis present

## 2014-08-14 DIAGNOSIS — L03119 Cellulitis of unspecified part of limb: Secondary | ICD-10-CM

## 2014-08-14 DIAGNOSIS — E039 Hypothyroidism, unspecified: Secondary | ICD-10-CM | POA: Diagnosis present

## 2014-08-14 DIAGNOSIS — E1140 Type 2 diabetes mellitus with diabetic neuropathy, unspecified: Secondary | ICD-10-CM | POA: Diagnosis present

## 2014-08-14 DIAGNOSIS — F319 Bipolar disorder, unspecified: Secondary | ICD-10-CM | POA: Diagnosis present

## 2014-08-14 DIAGNOSIS — E1142 Type 2 diabetes mellitus with diabetic polyneuropathy: Secondary | ICD-10-CM

## 2014-08-14 DIAGNOSIS — E119 Type 2 diabetes mellitus without complications: Secondary | ICD-10-CM

## 2014-08-14 HISTORY — DX: Polyneuropathy, unspecified: G62.9

## 2014-08-14 HISTORY — DX: Bipolar disorder, unspecified: F31.9

## 2014-08-14 LAB — GLUCOSE, CAPILLARY
GLUCOSE-CAPILLARY: 190 mg/dL — AB (ref 70–99)
Glucose-Capillary: 166 mg/dL — ABNORMAL HIGH (ref 70–99)

## 2014-08-14 LAB — BASIC METABOLIC PANEL
Anion gap: 11 (ref 5–15)
BUN: 16 mg/dL (ref 6–23)
CO2: 24 mmol/L (ref 19–32)
CREATININE: 0.57 mg/dL (ref 0.50–1.10)
Calcium: 9 mg/dL (ref 8.4–10.5)
Chloride: 100 mmol/L (ref 96–112)
GFR calc non Af Amer: >90 mL/min (ref >90)
Glucose, Bld: 195 mg/dL — ABNORMAL HIGH (ref 70–99)
Potassium: 3.9 mmol/L (ref 3.5–5.1)
Sodium: 135 mmol/L (ref 135–145)

## 2014-08-14 LAB — CBC WITH DIFFERENTIAL/PLATELET
BASOS PCT: 0 % (ref 0–1)
Basophils Absolute: 0 10*3/uL (ref 0.0–0.1)
EOS ABS: 0.2 10*3/uL (ref 0.0–0.7)
Eosinophils Relative: 2 % (ref 0–5)
HCT: 42.4 % (ref 36.0–46.0)
Hemoglobin: 14 g/dL (ref 12.0–15.0)
Lymphocytes Relative: 32 % (ref 12–46)
Lymphs Abs: 2.7 10*3/uL (ref 0.7–4.0)
MCH: 32.4 pg (ref 26.0–34.0)
MCHC: 33 g/dL (ref 30.0–36.0)
MCV: 98.1 fL (ref 78.0–100.0)
Monocytes Absolute: 0.9 10*3/uL (ref 0.1–1.0)
Monocytes Relative: 11 % (ref 3–12)
NEUTROS ABS: 4.6 10*3/uL (ref 1.7–7.7)
Neutrophils Relative %: 55 % (ref 43–77)
PLATELETS: 225 10*3/uL (ref 150–400)
RBC: 4.32 MIL/uL (ref 3.87–5.11)
RDW: 12.6 % (ref 11.5–15.5)
WBC: 8.4 10*3/uL (ref 4.0–10.5)

## 2014-08-14 LAB — MRSA PCR SCREENING: MRSA by PCR: NEGATIVE

## 2014-08-14 MED ORDER — LORAZEPAM 0.5 MG PO TABS: 0.5000 mg | ORAL_TABLET | Freq: Four times a day (QID) | ORAL | Status: DC | PRN

## 2014-08-14 MED ORDER — SODIUM CHLORIDE 0.9 % IJ SOLN
3.0000 mL | Freq: Two times a day (BID) | INTRAMUSCULAR | Status: DC
  Administered 2014-08-14 – 2014-08-16 (×2): 3 mL via INTRAVENOUS

## 2014-08-14 MED ORDER — GABAPENTIN 300 MG PO CAPS
300.0000 mg | ORAL_CAPSULE | Freq: Three times a day (TID) | ORAL | Status: DC
  Administered 2014-08-14 – 2014-08-17 (×9): 300 mg via ORAL
  Filled 2014-08-14 (×11): qty 1

## 2014-08-14 MED ORDER — SODIUM CHLORIDE 0.9 % IJ SOLN: 3.0000 mL | INTRAMUSCULAR | Status: DC | PRN

## 2014-08-14 MED ORDER — ROSUVASTATIN CALCIUM 10 MG PO TABS
10.0000 mg | ORAL_TABLET | ORAL | Status: DC
  Administered 2014-08-14: 10 mg via ORAL
  Filled 2014-08-14 (×2): qty 1

## 2014-08-14 MED ORDER — INSULIN ASPART 100 UNIT/ML ~~LOC~~ SOLN
0.0000 [IU] | Freq: Three times a day (TID) | SUBCUTANEOUS | Status: DC
  Administered 2014-08-14 – 2014-08-16 (×6): 3 [IU] via SUBCUTANEOUS
  Administered 2014-08-17: 2 [IU] via SUBCUTANEOUS

## 2014-08-14 MED ORDER — MONTELUKAST SODIUM 10 MG PO TABS
10.0000 mg | ORAL_TABLET | Freq: Every day | ORAL | Status: DC
  Administered 2014-08-14 – 2014-08-16 (×3): 10 mg via ORAL
  Filled 2014-08-14 (×4): qty 1

## 2014-08-14 MED ORDER — NAPROXEN 500 MG PO TABS
500.0000 mg | ORAL_TABLET | Freq: Two times a day (BID) | ORAL | Status: DC
  Administered 2014-08-14 – 2014-08-17 (×6): 500 mg via ORAL
  Filled 2014-08-14 (×8): qty 1

## 2014-08-14 MED ORDER — CEFAZOLIN SODIUM 1-5 GM-% IV SOLN
1.0000 g | Freq: Three times a day (TID) | INTRAVENOUS | Status: DC
  Administered 2014-08-14 – 2014-08-17 (×8): 1 g via INTRAVENOUS
  Filled 2014-08-14 (×10): qty 50

## 2014-08-14 MED ORDER — BUDESONIDE-FORMOTEROL FUMARATE 160-4.5 MCG/ACT IN AERO
2.0000 | INHALATION_SPRAY | Freq: Two times a day (BID) | RESPIRATORY_TRACT | Status: DC
  Administered 2014-08-14 – 2014-08-17 (×6): 2 via RESPIRATORY_TRACT
  Filled 2014-08-14: qty 6

## 2014-08-14 MED ORDER — ONDANSETRON 4 MG PO TBDP
4.0000 mg | ORAL_TABLET | Freq: Once | ORAL | Status: AC
  Administered 2014-08-14: 4 mg via ORAL
  Filled 2014-08-14: qty 1

## 2014-08-14 MED ORDER — ENOXAPARIN SODIUM 60 MG/0.6ML ~~LOC~~ SOLN
55.0000 mg | SUBCUTANEOUS | Status: DC
  Administered 2014-08-14 – 2014-08-16 (×3): 55 mg via SUBCUTANEOUS
  Filled 2014-08-14 (×5): qty 0.6

## 2014-08-14 MED ORDER — CARVEDILOL 12.5 MG PO TABS
12.5000 mg | ORAL_TABLET | Freq: Two times a day (BID) | ORAL | Status: DC
  Administered 2014-08-14 – 2014-08-17 (×6): 12.5 mg via ORAL
  Filled 2014-08-14 (×8): qty 1

## 2014-08-14 MED ORDER — METFORMIN HCL 500 MG PO TABS
1000.0000 mg | ORAL_TABLET | Freq: Every day | ORAL | Status: DC
  Administered 2014-08-15 – 2014-08-17 (×3): 1000 mg via ORAL
  Filled 2014-08-14 (×4): qty 2

## 2014-08-14 MED ORDER — INSULIN ASPART 100 UNIT/ML ~~LOC~~ SOLN
0.0000 [IU] | Freq: Every day | SUBCUTANEOUS | Status: DC
  Administered 2014-08-15: 3 [IU] via SUBCUTANEOUS

## 2014-08-14 MED ORDER — POLYETHYLENE GLYCOL 3350 17 G PO PACK: 17.0000 g | PACK | Freq: Every day | ORAL | Status: DC | PRN

## 2014-08-14 MED ORDER — DOXYCYCLINE HYCLATE 100 MG IV SOLR
100.0000 mg | Freq: Two times a day (BID) | INTRAVENOUS | Status: DC
  Administered 2014-08-14 – 2014-08-17 (×6): 100 mg via INTRAVENOUS
  Filled 2014-08-14 (×8): qty 100

## 2014-08-14 MED ORDER — QUETIAPINE FUMARATE ER 400 MG PO TB24
400.0000 mg | ORAL_TABLET | Freq: Every day | ORAL | Status: DC
  Administered 2014-08-14 – 2014-08-16 (×3): 400 mg via ORAL
  Filled 2014-08-14 (×4): qty 1

## 2014-08-14 MED ORDER — HYDROCODONE-ACETAMINOPHEN 5-325 MG PO TABS
2.0000 | ORAL_TABLET | Freq: Once | ORAL | Status: AC
  Administered 2014-08-14: 2 via ORAL
  Filled 2014-08-14: qty 2

## 2014-08-14 MED ORDER — ARIPIPRAZOLE 15 MG PO TABS
15.0000 mg | ORAL_TABLET | Freq: Every day | ORAL | Status: DC
  Administered 2014-08-15 – 2014-08-17 (×3): 15 mg via ORAL
  Filled 2014-08-14 (×3): qty 1

## 2014-08-14 MED ORDER — ALBUTEROL SULFATE (2.5 MG/3ML) 0.083% IN NEBU: 2.5000 mg | INHALATION_SOLUTION | RESPIRATORY_TRACT | Status: DC | PRN

## 2014-08-14 MED ORDER — ONDANSETRON HCL 4 MG/2ML IJ SOLN: 4.0000 mg | Freq: Four times a day (QID) | INTRAMUSCULAR | Status: DC | PRN

## 2014-08-14 MED ORDER — LEVOTHYROXINE SODIUM 100 MCG PO TABS
100.0000 ug | ORAL_TABLET | Freq: Every day | ORAL | Status: DC
  Administered 2014-08-14 – 2014-08-16 (×3): 100 ug via ORAL
  Filled 2014-08-14 (×4): qty 1

## 2014-08-14 MED ORDER — HYDROCODONE-ACETAMINOPHEN 5-325 MG PO TABS
1.0000 | ORAL_TABLET | ORAL | Status: DC | PRN
  Administered 2014-08-14 – 2014-08-17 (×12): 1 via ORAL
  Filled 2014-08-14 (×12): qty 1

## 2014-08-14 MED ORDER — ONDANSETRON HCL 4 MG/2ML IJ SOLN: 4.0000 mg | Freq: Once | INTRAMUSCULAR | Status: DC

## 2014-08-14 MED ORDER — CEFAZOLIN SODIUM 1-5 GM-% IV SOLN
1.0000 g | Freq: Once | INTRAVENOUS | Status: AC
  Administered 2014-08-14: 1 g via INTRAVENOUS
  Filled 2014-08-14: qty 50

## 2014-08-14 MED ORDER — SODIUM CHLORIDE 0.9 % IV SOLN: 250.0000 mL | INTRAVENOUS | Status: DC | PRN

## 2014-08-14 MED ORDER — FLUTICASONE PROPIONATE 50 MCG/ACT NA SUSP
2.0000 | Freq: Every day | NASAL | Status: DC
  Administered 2014-08-14 – 2014-08-17 (×4): 2 via NASAL
  Filled 2014-08-14: qty 16

## 2014-08-14 MED ORDER — FAMOTIDINE 20 MG PO TABS
20.0000 mg | ORAL_TABLET | Freq: Every day | ORAL | Status: DC | PRN
  Filled 2014-08-14: qty 1

## 2014-08-14 MED ORDER — LAMOTRIGINE ER 300 MG PO TB24
300.0000 mg | ORAL_TABLET | Freq: Every day | ORAL | Status: DC
  Administered 2014-08-15 – 2014-08-17 (×3): 300 mg via ORAL
  Filled 2014-08-14 (×3): qty 1

## 2014-08-14 MED ORDER — ALUM & MAG HYDROXIDE-SIMETH 200-200-20 MG/5ML PO SUSP: 30.0000 mL | Freq: Four times a day (QID) | ORAL | Status: DC | PRN

## 2014-08-14 MED ORDER — METHOCARBAMOL 500 MG PO TABS
750.0000 mg | ORAL_TABLET | Freq: Three times a day (TID) | ORAL | Status: DC | PRN
  Administered 2014-08-15: 750 mg via ORAL
  Filled 2014-08-14: qty 2

## 2014-08-14 MED ORDER — ONDANSETRON HCL 4 MG PO TABS: 4.0000 mg | ORAL_TABLET | Freq: Four times a day (QID) | ORAL | Status: DC | PRN

## 2014-08-14 NOTE — ED Provider Notes (Signed)
CSN: [id number]     Arrival date & time 08/14/14  1003 History   First MD Initiated Contact with Patient 08/14/14 1127     Chief Complaint  Patient presents with  . Cellulitis    infwected l/knee around suture line  . Leg Swelling    pain and tightness in l/calf     (Consider location/radiation/quality/duration/timing/severity/associated sxs/prior Treatment) HPI Veronica Rivers is a 49 -year-old female with past medical history of hypertension, diabetes who presents the ER complaining of a leg infection. Patient states Monday, 08/11/14 she suffered a mechanical fall resulted in abrasion and laceration to her left knee. Patient states she came to the emergency room and had the laceration repaired. After laceration was repaired patient was placed on prophylactic antibiotics due to her history of diabetes on Keflex. Patient states over the past 24 hours she has noticed erythema and warmth to the site of her laceration, which has spread quickly today. Patient denies numbness, weakness, loss of sensation or function. Patient denies fever, and reports some mild nausea without vomiting.  Past Medical History  Diagnosis Date  . HYPOTHYROIDISM   . DM   . HYPERCHOLESTEROLEMIA   . HYPERLIPIDEMIA   . SUPRAVENTRICULAR TACHYCARDIA   . DYSPNEA   . CHEST PAIN   . Cellulitis   . Bipolar depression   . Neuropathy    Past Surgical History  Procedure Laterality Date  . Cesarean section    . Tubal ligation    . Neck surgery    . Foot surgery    . Tonsillectomy    . Nasal septum surgery     Family History  Problem Relation Age of Onset  . Hypertension Mother   . Cancer Father     Lung, died in his 50s  . COPD Mother     Died age 39   History  Substance Use Topics  . Smoking status: Former Smoker -- 15 years    Types: Cigarettes    Quit date: 11/23/1997  . Smokeless tobacco: Never Used  . Alcohol Use: No   OB History    No data available     Review of Systems  Constitutional: Negative  for fever.  HENT: Negative for trouble swallowing.   Eyes: Negative for visual disturbance.  Respiratory: Negative for shortness of breath.   Cardiovascular: Negative for chest pain.  Gastrointestinal: Negative for nausea, vomiting and abdominal pain.  Genitourinary: Negative for dysuria.  Musculoskeletal: Negative for neck pain.  Skin: Positive for rash and wound.  Neurological: Negative for dizziness, weakness and numbness.  Psychiatric/Behavioral: Negative.       Allergies  Zocor and Sulfa drugs cross reactors  Home Medications   Prior to Admission medications   Medication Sig Start Date End Date Taking? Authorizing Provider  ABILIFY 15 MG tablet Take 1 tablet by mouth Daily. 08/10/11  Yes Historical Provider, MD  albuterol (PROVENTIL HFA;VENTOLIN HFA) 108 (90 BASE) MCG/ACT inhaler Inhale 1-2 puffs into the lungs every 4 (four) hours as needed for wheezing or shortness of breath (wheezing and shortness of breath).    Yes Historical Provider, MD  carvedilol (COREG) 12.5 MG tablet TAKE 1 TABLET TWICE DAILY  WITH MEALS Patient taking differently: Take 12.5 mg by mouth 2 (two) times daily with a meal.  02/24/14  Yes Josue Hector, MD  cephALEXin (KEFLEX) 500 MG capsule Take 1 capsule (500 mg total) by mouth 4 (four) times daily. 08/11/14  Yes Britt Bottom, NP  cetirizine (ZYRTEC) 10 MG tablet Take  10 mg by mouth at bedtime.    Yes Historical Provider, MD  fluticasone (FLONASE) 50 MCG/ACT nasal spray Place 2 sprays into both nostrils daily.   Yes Historical Provider, MD  gabapentin (NEURONTIN) 300 MG capsule Take 1 capsule by mouth 3 (three) times daily. 04/21/14  Yes Historical Provider, MD  HYDROcodone-acetaminophen (NORCO/VICODIN) 5-325 MG per tablet Take 1 tablet by mouth every 4 (four) hours as needed. 08/11/14  Yes Britt Bottom, NP  LamoTRIgine 300 MG TB24 Take 300 mg by mouth daily.   Yes Historical Provider, MD  levothyroxine (SYNTHROID, LEVOTHROID) 100 MCG tablet Take  100 mcg by mouth at bedtime.    Yes Historical Provider, MD  LORazepam (ATIVAN) 0.5 MG tablet Take 1 tablet by mouth 4 (four) times daily as needed for anxiety.  07/23/14  Yes Historical Provider, MD  metFORMIN (GLUCOPHAGE) 1000 MG tablet Take 1,000 mg by mouth daily with breakfast.   Yes Historical Provider, MD  methocarbamol (ROBAXIN) 750 MG tablet Take 1 tablet by mouth every 8 (eight) hours as needed for muscle spasms (muscle spasms).  03/31/14  Yes Historical Provider, MD  montelukast (SINGULAIR) 10 MG tablet Take 10 mg by mouth at bedtime.   Yes Historical Provider, MD  mupirocin ointment (BACTROBAN) 2 % Place 1 application into the nose 2 (two) times daily.   Yes Historical Provider, MD  naproxen (NAPROSYN) 500 MG tablet Take 1 tablet (500 mg total) by mouth 2 (two) times daily. 08/11/14  Yes Britt Bottom, NP  oxyCODONE-acetaminophen (PERCOCET/ROXICET) 5-325 MG per tablet Take 1 tablet by mouth every 6 (six) hours as needed for moderate pain (pain).  11/27/12  Yes Venetia Maxon Rama, MD  QUEtiapine (SEROQUEL XR) 400 MG 24 hr tablet Take 400 mg by mouth at bedtime.   Yes Historical Provider, MD  ranitidine (ZANTAC) 300 MG tablet Take 300 mg by mouth daily as needed for heartburn (heartburn).   Yes Historical Provider, MD  rosuvastatin (CRESTOR) 10 MG tablet Take 1 tablet (10 mg total) by mouth 2 (two) times a week. 05/15/14  Yes Josue Hector, MD  SYMBICORT 160-4.5 MCG/ACT inhaler Inhale 2 puffs into the lungs 2 (two) times daily. 07/30/14  Yes Historical Provider, MD  atorvastatin (LIPITOR) 20 MG tablet Take 1 tablet (20 mg total) by mouth daily. Patient not taking: Reported on 08/11/2014 05/05/14   Josue Hector, MD  carvedilol (COREG) 12.5 MG tablet TAKE 1 TABLET TWICE DAILY  WITH MEALS Patient not taking: Reported on 08/11/2014 07/28/14   Josue Hector, MD   BP 127/74 mmHg  Pulse 84  Temp(Src) 97.9 F (36.6 C) (Oral)  Resp 18  Ht 5\' 9"  (1.753 m)  Wt 253 lb (114.76 kg)  BMI 37.34 kg/m2   SpO2 94% Physical Exam  Constitutional: She is oriented to person, place, and time. She appears well-developed and well-nourished. No distress.  HENT:  Head: Normocephalic and atraumatic.  Mouth/Throat: Oropharynx is clear and moist. No oropharyngeal exudate.  Eyes: Right eye exhibits no discharge. Left eye exhibits no discharge. No scleral icterus.  Neck: Normal range of motion.  Cardiovascular: Normal rate, regular rhythm and normal heart sounds.   No murmur heard. Pulmonary/Chest: Effort normal and breath sounds normal. No respiratory distress.  Abdominal: Soft. There is no tenderness.  Musculoskeletal: Normal range of motion. She exhibits no edema or tenderness.  Neurological: She is alert and oriented to person, place, and time. No cranial nerve deficit. Coordination normal.  Skin: Skin is warm and dry. No rash  noted. She is not diaphoretic.  Abrasions noted over left knee which are in the process of healing with sutured wound closed over left proximal tibial region. Surrounding erythema and mild induration noted. Splotchy erythema noted throughout patient's tibial region which extends posteriorly almost circumferentially around her calf. The area is warm to the touch, and tender.  Psychiatric: She has a normal mood and affect.  Nursing note and vitals reviewed.   ED Course  Procedures (including critical care time) Labs Review Labs Reviewed  BASIC METABOLIC PANEL - Abnormal; Notable for the following:    Glucose, Bld 195 (*)    All other components within normal limits  GLUCOSE, CAPILLARY - Abnormal; Notable for the following:    Glucose-Capillary 166 (*)    All other components within normal limits  MRSA PCR SCREENING  CULTURE, BLOOD (ROUTINE X 2)  CBC WITH DIFFERENTIAL/PLATELET  BASIC METABOLIC PANEL    Imaging Review No results found.   EKG Interpretation None      MDM   Final diagnoses:  Cellulitis of left lower extremity    Patient here with cellulitis to  her left lower leg from a wound infection. Patient has formally failed outpatient antibiotics that she's been taking Keflex as prescribed for approximately 3-4 days, and has had worsening of her symptoms over the past 24 hours. We will plan to admit patient for cellulitis and failed outpatient antibiotic therapy. No concern for sepsis or SIRS at this time, patient placed on IV cefazolin. I spoke with Dr. Rockne Menghini who agrees to admit patient. The patient appears reasonably stabilized for admission considering the current resources, flow, and capabilities available in the ED at this time, and I doubt any other [facility] requiring further screening and/or treatment in the ED prior to admission.  BP 127/74 mmHg  Pulse 84  Temp(Src) 97.9 F (36.6 C) (Oral)  Resp 18  Ht 5\' 9"  (1.753 m)  Wt 253 lb (114.76 kg)  BMI 37.34 kg/m2  SpO2 94%  Signed,  Dahlia Bailiff, PA-C 5:25 PM  Patient discussed with Dr. Francine Graven, MD    Dahlia Bailiff, PA-C 08/14/14 [place], DO 08/16/14 [id number]

## 2014-08-14 NOTE — ED Notes (Signed)
Pt reports increased swelling and tenderness in l/calf. Suture line on l/knee is  red and warm to touch. Area around suture line red and painful. Pt reports hx of cellulitus

## 2014-08-14 NOTE — ED Notes (Signed)
Report called to Caroline, RN

## 2014-08-14 NOTE — Progress Notes (Signed)
UR completed 

## 2014-08-14 NOTE — H&P (Signed)
History and Physical:    Veronica Rivers   CNO:[id number] DOB: 15-Feb-1966 DOA: 08/14/2014  Referring physician: Dr. Francine Graven PCP: Gavin Pound, MD   Chief Complaint: Leg swelling  History of Present Illness:   Veronica Rivers is an 49 y.o. female with a PMH of DM, hypothyroidism, hypertension, and prior cellulitis who was seen in the ED 08/11/14 after tripping on a root and sustaining a laceration to her right middle finger and abrasion to the left knee. She was given a TD Booster and 5 sutures were placed to repair the left knee laceration. She was discharged on Keflex and reports that she has taken this as prescribed.  The patient noticed that she had some redness, increased warmth to the left lower leg and knee.  The red areas also are tender/painful.  Cold compresses ease the pain off.  Pressure and touch exacerbate the pain.  Had some bloody drainage from the sutures on 08/11/14 but the area has scabbed over and she has not had any drainage since.  Reports that her blood sugars have been running a little high over the past week.  She checks them daily in the mornings, and her sugars have been running in the 150s.    ROS:   Constitutional: No fever, no chills;  Appetite normal; No weight loss, no weight gain, + fatigue.  HEENT: No blurry vision, no diplopia, no pharyngitis, no dysphagia CV: No chest pain, no palpitations, no PND, no orthopnea, no edema.  Resp: + SOB, no cough, no pleuritic pain. GI: + nausea, no vomiting, no diarrhea, no melena, no hematochezia, no constipation, no abdominal pain.  GU: No dysuria, no hematuria, no frequency, no urgency. MSK: no myalgias, no arthralgias.  Neuro:  No headache, no focal neurological deficits, no history of seizures.  Psych: No depression, no anxiety.  Endo: No heat intolerance, no cold intolerance, no polyuria, no polydipsia  Skin: No rashes, no skin lesions.  Heme: No easy bruising.  Travel history: No recent travel.   Past Medical History:     Past Medical History  Diagnosis Date  . HYPOTHYROIDISM   . DM   . HYPERCHOLESTEROLEMIA   . HYPERLIPIDEMIA   . SUPRAVENTRICULAR TACHYCARDIA   . DYSPNEA   . CHEST PAIN   . Cellulitis   . Bipolar depression   . Neuropathy     Past Surgical History:   Past Surgical History  Procedure Laterality Date  . Cesarean section    . Tubal ligation    . Neck surgery    . Foot surgery    . Tonsillectomy    . Nasal septum surgery      Social History:   History   Social History  . Marital Status: Married    Spouse Name: Wiley  . Number of Children: 1  . Years of Education: 16   Occupational History  . Unemployed    Social History Main Topics  . Smoking status: Former Smoker -- 15 years    Types: Cigarettes    Quit date: 11/23/1997  . Smokeless tobacco: Never Used  . Alcohol Use: No  . Drug Use: No  . Sexual Activity: Yes    Birth Control/ Protection: Surgical   Other Topics Concern  . Not on file   Social History Narrative   Married.  Lives with husband.  Independent of ADLs and with ambulation.    Family history:   Family History  Problem Relation Age of Onset  . Hypertension  Mother   . Cancer Father     Lung, died in his 80s  . COPD Mother     Died age 2    Allergies   Zocor and Sulfa drugs cross reactors  Current Medications:   Prior to Admission medications   Medication Sig Start Date End Date Taking? Authorizing Provider  ABILIFY 15 MG tablet Take 1 tablet by mouth Daily. 08/10/11  Yes Historical Provider, MD  albuterol (PROVENTIL HFA;VENTOLIN HFA) 108 (90 BASE) MCG/ACT inhaler Inhale 1-2 puffs into the lungs every 4 (four) hours as needed for wheezing or shortness of breath (wheezing and shortness of breath).    Yes Historical Provider, MD  carvedilol (COREG) 12.5 MG tablet TAKE 1 TABLET TWICE DAILY  WITH MEALS Patient taking differently: Take 12.5 mg by mouth 2 (two) times daily with a meal.  02/24/14  Yes Josue Hector, MD  cephALEXin  (KEFLEX) 500 MG capsule Take 1 capsule (500 mg total) by mouth 4 (four) times daily. 08/11/14  Yes Britt Bottom, NP  cetirizine (ZYRTEC) 10 MG tablet Take 10 mg by mouth at bedtime.    Yes Historical Provider, MD  fluticasone (FLONASE) 50 MCG/ACT nasal spray Place 2 sprays into both nostrils daily.   Yes Historical Provider, MD  gabapentin (NEURONTIN) 300 MG capsule Take 1 capsule by mouth 3 (three) times daily. 04/21/14  Yes Historical Provider, MD  HYDROcodone-acetaminophen (NORCO/VICODIN) 5-325 MG per tablet Take 1 tablet by mouth every 4 (four) hours as needed. 08/11/14  Yes Britt Bottom, NP  LamoTRIgine 300 MG TB24 Take 300 mg by mouth daily.   Yes Historical Provider, MD  levothyroxine (SYNTHROID, LEVOTHROID) 100 MCG tablet Take 100 mcg by mouth at bedtime.    Yes Historical Provider, MD  LORazepam (ATIVAN) 0.5 MG tablet Take 1 tablet by mouth 4 (four) times daily as needed for anxiety.  07/23/14  Yes Historical Provider, MD  metFORMIN (GLUCOPHAGE) 1000 MG tablet Take 1,000 mg by mouth daily with breakfast.   Yes Historical Provider, MD  methocarbamol (ROBAXIN) 750 MG tablet Take 1 tablet by mouth every 8 (eight) hours as needed for muscle spasms (muscle spasms).  03/31/14  Yes Historical Provider, MD  montelukast (SINGULAIR) 10 MG tablet Take 10 mg by mouth at bedtime.   Yes Historical Provider, MD  mupirocin ointment (BACTROBAN) 2 % Place 1 application into the nose 2 (two) times daily.   Yes Historical Provider, MD  naproxen (NAPROSYN) 500 MG tablet Take 1 tablet (500 mg total) by mouth 2 (two) times daily. 08/11/14  Yes Britt Bottom, NP  oxyCODONE-acetaminophen (PERCOCET/ROXICET) 5-325 MG per tablet Take 1 tablet by mouth every 6 (six) hours as needed for moderate pain (pain).  11/27/12  Yes Venetia Maxon Rama, MD  QUEtiapine (SEROQUEL XR) 400 MG 24 hr tablet Take 400 mg by mouth at bedtime.   Yes Historical Provider, MD  ranitidine (ZANTAC) 300 MG tablet Take 300 mg by mouth daily  as needed for heartburn (heartburn).   Yes Historical Provider, MD  rosuvastatin (CRESTOR) 10 MG tablet Take 1 tablet (10 mg total) by mouth 2 (two) times a week. 05/15/14  Yes Josue Hector, MD  SYMBICORT 160-4.5 MCG/ACT inhaler Inhale 2 puffs into the lungs 2 (two) times daily. 07/30/14  Yes Historical Provider, MD  atorvastatin (LIPITOR) 20 MG tablet Take 1 tablet (20 mg total) by mouth daily. Patient not taking: Reported on 08/11/2014 05/05/14   Josue Hector, MD  carvedilol (COREG) 12.5 MG tablet TAKE 1 TABLET  TWICE DAILY  WITH MEALS Patient not taking: Reported on 08/11/2014 07/28/14   Josue Hector, MD    Physical Exam:   Filed Vitals:   08/14/14 1009 08/14/14 1313  BP: 123/79 127/74  Pulse: 88 83  Temp: 98.3 F (36.8 C) 98.1 F (36.7 C)  TempSrc:  Oral  Resp: 20 20  Weight: 114.76 kg (253 lb)   SpO2: 97% 95%     Physical Exam: Blood pressure 127/74, pulse 83, temperature 98.1 F (36.7 C), temperature source Oral, resp. rate 20, weight 114.76 kg (253 lb), SpO2 95 %. Gen: No acute distress. Head: Normocephalic, atraumatic. Eyes: PERRL, EOMI, sclerae nonicteric. Mouth: Oropharynx clear. Neck: Supple, no thyromegaly, no lymphadenopathy, no jugular venous distention. Chest: Lungs clear to auscultation bilaterally. CV: Heart sounds are regular. No murmurs, rubs, or gallops. Abdomen: Soft, nontender, nondistended with normal active bowel sounds. Extremities: Extremities are without clubbing, edema, or cyanosis. Right knee with sutures/abrasion. Skin: Warm and dry. Streaky erythema left lower extremity. Margins inked. Neuro: Alert and oriented times 3; cranial nerves II through XII grossly intact. Psych: Mood and affect normal.   Data Review:    Labs: Basic Metabolic Panel:  Recent Labs Lab 08/14/14 1148  NA 135  K 3.9  CL 100  CO2 24  GLUCOSE 195*  BUN 16  CREATININE 0.57  CALCIUM 9.0   CBC:  Recent Labs Lab 08/14/14 1148  WBC 8.4  NEUTROABS 4.6  HGB  14.0  HCT 42.4  MCV 98.1  PLT 225    Radiographic Studies: No results found.  Assessment/Plan:   Principal Problem:   Cellulitis - This is the patient's third episode of cellulitis. She failed therapy with cephalosporins during the past 2 episodes. - We'll treat with cefazolin and doxycycline. - MRSA PCR screen requested. - Blood culture sent.  Active Problems:   Hypothyroidism - Continue home dose of Synthroid.    Type 2 diabetes mellitus - Continue metformin. Will add SSI, moderate scale Q AC and HS.    Elevated lipids - Continue biweekly Crestor.    Essential hypertension - Continue Coreg.    Diabetic neuropathy - Continue Neurontin.    Bipolar depression - Continue Lamictal, Abilify and Seroquel.    DVT prophylaxis - Lovenox ordered.  Code Status: Full. Family Communication: Bevelyn Ngo Disposition Plan: Home when stable.  Time spent: One hour.  RAMA,CHRISTINA [facility] Pager [phone] Cell: [phone]   If 7PM-7AM, please contact night-coverage [email or web address] Password [email or web address] 08/14/2014, 2:54 PM

## 2014-08-15 DIAGNOSIS — E038 Other specified hypothyroidism: Secondary | ICD-10-CM

## 2014-08-15 LAB — BASIC METABOLIC PANEL
Anion gap: 9 (ref 5–15)
BUN: 11 mg/dL (ref 6–23)
CHLORIDE: 99 mmol/L (ref 96–112)
CO2: 32 mmol/L (ref 19–32)
CREATININE: 0.71 mg/dL (ref 0.50–1.10)
Calcium: 9 mg/dL (ref 8.4–10.5)
GFR calc non Af Amer: >90 mL/min (ref >90)
Glucose, Bld: 184 mg/dL — ABNORMAL HIGH (ref 70–99)
Potassium: 4.2 mmol/L (ref 3.5–5.1)
SODIUM: 140 mmol/L (ref 135–145)

## 2014-08-15 LAB — GLUCOSE, CAPILLARY
Glucose-Capillary: 167 mg/dL — ABNORMAL HIGH (ref 70–99)
Glucose-Capillary: 174 mg/dL — ABNORMAL HIGH (ref 70–99)
Glucose-Capillary: 152 mg/dL — ABNORMAL HIGH (ref 70–99)

## 2014-08-15 MED ORDER — RISAQUAD PO CAPS
1.0000 | ORAL_CAPSULE | Freq: Every day | ORAL | Status: DC
Start: 2014-08-15 — End: 2014-08-17
  Administered 2014-08-15 – 2014-08-17 (×3): 1 via ORAL
  Filled 2014-08-15 (×3): qty 1

## 2014-08-15 MED ORDER — ACETAMINOPHEN 325 MG PO TABS: 650.0000 mg | ORAL_TABLET | ORAL | Status: DC | PRN

## 2014-08-15 NOTE — Progress Notes (Signed)
[facility] Progress Note   Veronica Rivers [id number] DOB: Jan 06, 1966 DOA: 08/14/2014 PCP: Gavin Pound, MD  Brief narrative: Veronica Rivers is a 49 y.o. female PMH of DM, hypothyroidism, hypertension, and prior cellulitis who sustained a laceration to her left knee on 4/4 after falling. She had sutures placed in the ER and was discharged home with keflex. She developed a cellulitis despite taking Keflex properly.    Subjective: Pain is much better today along with the redness. She is able to bear weight on the left leg better than yesterday.   Assessment/Plan: Principal Problem:   Cellulitis left knee and leg - husband has shown me a picture of it from yesterday and there has been a great improvement in the erythema - improved with addition of IV Doxy and transitioning Keflex to Ancef- will continue this management until tomorrow  - have added FloraQ  Active Problems:   Hypothyroidism - cont Synthroid     Type 2 diabetes mellitus - cont Metformin, ISS    Elevated lipids -cont Crestor (on Mond and Thurs)- she does not want a heart healthy diet as the cafeteria was not allowing her to eat yogurt and would like it to be changed    Essential hypertension - on Coreg    Diabetic neuropathy -cont Neurontin    Bipolar depression - stable on Lamictal, Abilify and Seroquel  Appt with PCP: requested Code Status: full code Family Communication: husband at bedside Disposition Plan: home tomorrow DVT prophylaxis: Lovenox Consultants: Procedures:  Antibiotics: Anti-infectives    Start     Dose/Rate Route Frequency Ordered Stop   08/14/14 2200  ceFAZolin (ANCEF) IVPB 1 g/50 mL premix     1 g 100 mL/hr over 30 Minutes Intravenous 3 times per day 08/14/14 1536     08/14/14 1600  doxycycline (VIBRAMYCIN) 100 mg in dextrose 5 % 250 mL IVPB     100 mg 125 mL/hr over 120 Minutes Intravenous Every 12 hours 08/14/14 1536     08/14/14 1400  ceFAZolin (ANCEF) IVPB 1 g/50 mL  premix     1 g 100 mL/hr over 30 Minutes Intravenous  Once 08/14/14 1357 08/14/14 1458      Objective: Filed Weights   08/14/14 1009 08/14/14 1521  Weight: 114.76 kg (253 lb) 114.76 kg (253 lb)    Intake/Output Summary (Last 24 hours) at 08/15/14 1103 Last data filed at 08/15/14 0945  Gross per 24 hour  Intake    240 ml  Output   3375 ml  Net  -3135 ml     Vitals Filed Vitals:   08/14/14 2144 08/14/14 2150 08/15/14 0547 08/15/14 0743  BP: 127/66  109/63   Pulse: 73  71   Temp: 97.9 F (36.6 C)  97.4 F (36.3 C)   TempSrc: Oral  Oral   Resp: 16  16   Height:      Weight:      SpO2: 97% 92% 93% 93%    Exam:  General:  Pt is alert, not in acute distress  HEENT: No icterus, No thrush  Cardiovascular: regular rate and rhythm, S1/S2 No murmur  Respiratory: clear to auscultation bilaterally   Abdomen: Soft, +Bowel sounds, non tender, non distended, no guarding  MSK: No LE edema, cyanosis or clubbing  Skin: mild erythema extending from just above left knee to include 2/3 of the shin in the from and upper calf and lower thigh on the back- sutures on knee are intact- no discharge  Data  Reviewed: Basic Metabolic Panel:  Recent Labs Lab 08/14/14 1148 08/15/14 0400  NA 135 140  K 3.9 4.2  CL 100 99  CO2 24 32  GLUCOSE 195* 184*  BUN 16 11  CREATININE 0.57 0.71  CALCIUM 9.0 9.0   Liver Function Tests: No results for input(s): AST, ALT, ALKPHOS, BILITOT, PROT, ALBUMIN in the last 168 hours. No results for input(s): LIPASE, AMYLASE in the last 168 hours. No results for input(s): AMMONIA in the last 168 hours. CBC:  Recent Labs Lab 08/14/14 1148  WBC 8.4  NEUTROABS 4.6  HGB 14.0  HCT 42.4  MCV 98.1  PLT 225   Cardiac Enzymes: No results for input(s): CKTOTAL, CKMB, CKMBINDEX, TROPONINI in the last 168 hours. BNP (last 3 results) No results for input(s): BNP in the last 8760 hours.  ProBNP (last 3 results) No results for input(s): PROBNP in the  last 8760 hours.  CBG:  Recent Labs Lab 08/14/14 1644 08/14/14 2141 08/15/14 0749  GLUCAP 166* 190* 167*    Recent Results (from the past 240 hour(s))  Culture, blood (routine x 2)     Status: None (Preliminary result)   Collection Time: 08/14/14  2:09 PM  Result Value Ref Range Status   Specimen Description BLOOD RIGHT HAND  Final   Special Requests BOTTLES DRAWN AEROBIC AND ANAEROBIC 5 CC EA  Final   Culture   Final           BLOOD CULTURE RECEIVED NO GROWTH TO DATE CULTURE WILL BE HELD FOR 5 DAYS BEFORE ISSUING A FINAL NEGATIVE REPORT Performed at [facility]    Report Status PENDING  Incomplete  MRSA PCR Screening     Status: None   Collection Time: 08/14/14  3:57 PM  Result Value Ref Range Status   MRSA by PCR NEGATIVE NEGATIVE Final    Comment:        The GeneXpert MRSA Assay (FDA approved for NASAL specimens only), is one component of a comprehensive MRSA colonization surveillance program. It is not intended to diagnose MRSA infection nor to guide or monitor treatment for MRSA infections.      Studies:  Recent x-ray studies have been reviewed in detail by the Attending Physician  Scheduled Meds:  Scheduled Meds: . acidophilus  1 capsule Oral Daily  . ARIPiprazole  15 mg Oral Daily  . budesonide-formoterol  2 puff Inhalation BID  . carvedilol  12.5 mg Oral BID WC  .  ceFAZolin (ANCEF) IV  1 g Intravenous 3 times per day  . doxycycline (VIBRAMYCIN) IV  100 mg Intravenous Q12H  . enoxaparin (LOVENOX) injection  55 mg Subcutaneous Q24H  . fluticasone  2 spray Each Nare Daily  . gabapentin  300 mg Oral TID  . insulin aspart  0-15 Units Subcutaneous TID WC  . insulin aspart  0-5 Units Subcutaneous QHS  . LamoTRIgine  300 mg Oral Daily  . levothyroxine  100 mcg Oral QHS  . metFORMIN  1,000 mg Oral Q breakfast  . montelukast  10 mg Oral QHS  . naproxen  500 mg Oral BID WC  . QUEtiapine  400 mg Oral QHS  . rosuvastatin  10 mg Oral Once per day on  Mon Thu  . sodium chloride  3 mL Intravenous Q12H   Continuous Infusions:   Time spent on care of this patient: 35 min   [place], MD 08/15/2014, 11:03 AM  LOS: 1 day   [facility] Office  [phone] Pager - Text  Page per [email or web address]  If 7PM-7AM, please contact night-coverage [email or web address]

## 2014-08-16 DIAGNOSIS — L02419 Cutaneous abscess of limb, unspecified: Secondary | ICD-10-CM

## 2014-08-16 DIAGNOSIS — L03119 Cellulitis of unspecified part of limb: Secondary | ICD-10-CM

## 2014-08-16 LAB — GLUCOSE, CAPILLARY
GLUCOSE-CAPILLARY: 175 mg/dL — AB (ref 70–99)
GLUCOSE-CAPILLARY: 185 mg/dL — AB (ref 70–99)
Glucose-Capillary: 157 mg/dL — ABNORMAL HIGH (ref 70–99)
Glucose-Capillary: 103 mg/dL — ABNORMAL HIGH (ref 70–99)
Glucose-Capillary: 172 mg/dL — ABNORMAL HIGH (ref 70–99)

## 2014-08-16 MED ORDER — BACITRACIN-NEOMYCIN-POLYMYXIN 400-5-5000 EX OINT
TOPICAL_OINTMENT | Freq: Three times a day (TID) | CUTANEOUS | Status: DC
  Administered 2014-08-16 (×2): 1 via TOPICAL
  Administered 2014-08-17: 10:00:00 via TOPICAL
  Filled 2014-08-16 (×2): qty 1

## 2014-08-16 NOTE — Progress Notes (Signed)
[facility] Progress Note   Veronica Rivers [id number] DOB: Nov 02, 1965 DOA: 08/14/2014 PCP: Gavin Pound, MD  Brief narrative: Veronica Rivers is a 49 y.o. female PMH of DM, hypothyroidism, hypertension, and prior cellulitis who sustained a laceration to her left knee on 4/4 after falling. She had sutures placed in the ER and was discharged home with keflex. She developed a cellulitis despite taking Keflex properly.    Subjective: Blood and pus began to drain from her wound yesterday. Redness and pain in leg otherwise improving.   Assessment/Plan: Principal Problem:   Cellulitis left knee and leg - now with pus draining from wound- have asked surgery to evaluate- bedside I and D done-no culture sent- they will follow up tomorrow - husband has shown me a picture of it from admission and there has been a great improvement in the erythema - improved with addition of IV Doxy and transitioning Keflex to Ancef- will continue this management - have added FloraQ - blood culture negative  Active Problems:   Hypothyroidism - cont Synthroid     Type 2 diabetes mellitus - cont Metformin, ISS    Elevated lipids -cont Crestor (on Mond and Thurs)- she does not want a heart healthy diet as the cafeteria was not allowing her to eat yogurt and would like it to be changed    Essential hypertension - on Coreg    Diabetic neuropathy -cont Neurontin    Bipolar depression - stable on Lamictal, Abilify and Seroquel  Appt with PCP: requested Code Status: full code Family Communication: husband  Disposition Plan: home when cleared by surgery DVT prophylaxis: Lovenox Consultants: Procedures:  Antibiotics: Anti-infectives    Start     Dose/Rate Route Frequency Ordered Stop   08/14/14 2200  ceFAZolin (ANCEF) IVPB 1 g/50 mL premix     1 g 100 mL/hr over 30 Minutes Intravenous 3 times per day 08/14/14 1536     08/14/14 1600  doxycycline (VIBRAMYCIN) 100 mg in dextrose 5 % 250 mL IVPB     100 mg 125 mL/hr over 120 Minutes Intravenous Every 12 hours 08/14/14 1536     08/14/14 1400  ceFAZolin (ANCEF) IVPB 1 g/50 mL premix     1 g 100 mL/hr over 30 Minutes Intravenous  Once 08/14/14 1357 08/14/14 1458      Objective: Filed Weights   08/14/14 1009 08/14/14 1521  Weight: 114.76 kg (253 lb) 114.76 kg (253 lb)    Intake/Output Summary (Last 24 hours) at 08/16/14 1150 Last data filed at 08/16/14 0610  Gross per 24 hour  Intake    840 ml  Output   1400 ml  Net   -560 ml     Vitals Filed Vitals:   08/15/14 1942 08/15/14 2215 08/16/14 0615 08/16/14 0813  BP:  128/64 130/67   Pulse: 72 74 73   Temp:  97.7 F (36.5 C) 97.2 F (36.2 C)   TempSrc:  Oral Oral   Resp: 16 16 16    Height:      Weight:      SpO2: 94% 93% 96% 98%    Exam:  General:  Pt is alert, not in acute distress  HEENT: No icterus, No thrush  Cardiovascular: regular rate and rhythm, S1/S2 No murmur  Respiratory: clear to auscultation bilaterally   Abdomen: Soft, +Bowel sounds, non tender, non distended, no guarding  MSK: No LE edema, cyanosis or clubbing  Skin: significant improvement in erythema which is now very mild extending from just above  left knee to include 2/3 of the shin in the from and upper calf and lower thigh on the back- blood draining from medial aspect of wound- I am able to express pus mixed with blood as well.   Data Reviewed: Basic Metabolic Panel:  Recent Labs Lab 08/14/14 1148 08/15/14 0400  NA 135 140  K 3.9 4.2  CL 100 99  CO2 24 32  GLUCOSE 195* 184*  BUN 16 11  CREATININE 0.57 0.71  CALCIUM 9.0 9.0   Liver Function Tests: No results for input(s): AST, ALT, ALKPHOS, BILITOT, PROT, ALBUMIN in the last 168 hours. No results for input(s): LIPASE, AMYLASE in the last 168 hours. No results for input(s): AMMONIA in the last 168 hours. CBC:  Recent Labs Lab 08/14/14 1148  WBC 8.4  NEUTROABS 4.6  HGB 14.0  HCT 42.4  MCV 98.1  PLT 225   Cardiac  Enzymes: No results for input(s): CKTOTAL, CKMB, CKMBINDEX, TROPONINI in the last 168 hours. BNP (last 3 results) No results for input(s): BNP in the last 8760 hours.  ProBNP (last 3 results) No results for input(s): PROBNP in the last 8760 hours.  CBG:  Recent Labs Lab 08/15/14 0749 08/15/14 1134 08/15/14 1623 08/15/14 2216 08/16/14 0733  GLUCAP 167* 174* 152* 175* 157*    Recent Results (from the past 240 hour(s))  Culture, blood (routine x 2)     Status: None (Preliminary result)   Collection Time: 08/14/14  2:09 PM  Result Value Ref Range Status   Specimen Description BLOOD RIGHT HAND  Final   Special Requests BOTTLES DRAWN AEROBIC AND ANAEROBIC 5 CC EA  Final   Culture   Final           BLOOD CULTURE RECEIVED NO GROWTH TO DATE CULTURE WILL BE HELD FOR 5 DAYS BEFORE ISSUING A FINAL NEGATIVE REPORT Performed at [facility]    Report Status PENDING  Incomplete  MRSA PCR Screening     Status: None   Collection Time: 08/14/14  3:57 PM  Result Value Ref Range Status   MRSA by PCR NEGATIVE NEGATIVE Final    Comment:        The GeneXpert MRSA Assay (FDA approved for NASAL specimens only), is one component of a comprehensive MRSA colonization surveillance program. It is not intended to diagnose MRSA infection nor to guide or monitor treatment for MRSA infections.      Studies:  Recent x-ray studies have been reviewed in detail by the Attending Physician  Scheduled Meds:  Scheduled Meds: . acidophilus  1 capsule Oral Daily  . ARIPiprazole  15 mg Oral Daily  . budesonide-formoterol  2 puff Inhalation BID  . carvedilol  12.5 mg Oral BID WC  .  ceFAZolin (ANCEF) IV  1 g Intravenous 3 times per day  . doxycycline (VIBRAMYCIN) IV  100 mg Intravenous Q12H  . enoxaparin (LOVENOX) injection  55 mg Subcutaneous Q24H  . fluticasone  2 spray Each Nare Daily  . gabapentin  300 mg Oral TID  . insulin aspart  0-15 Units Subcutaneous TID WC  . insulin aspart  0-5  Units Subcutaneous QHS  . LamoTRIgine  300 mg Oral Daily  . levothyroxine  100 mcg Oral QHS  . metFORMIN  1,000 mg Oral Q breakfast  . montelukast  10 mg Oral QHS  . naproxen  500 mg Oral BID WC  . neomycin-bacitracin-polymyxin   Topical TID  . QUEtiapine  400 mg Oral QHS  . rosuvastatin  10 mg Oral Once per day on Mon Thu  . sodium chloride  3 mL Intravenous Q12H   Continuous Infusions:   Time spent on care of this patient: 35 min   [place], MD 08/16/2014, 11:50 AM  LOS: 2 days   [facility] Office  [phone] Pager - Text Page per [email or web address]  If 7PM-7AM, please contact night-coverage [email or web address]

## 2014-08-16 NOTE — Consult Note (Signed)
General Surgery [facility]  Reason for Consult:  Cellulitis, laceration left knee  Referring Physician:  Dr. Wynelle Cleveland, [facility]  Veronica Rivers is an 49 y.o. female.  HPI: patient sustained laceration to left knee while walking in woods.  Dirty wound cleaned and sutured 08/11/2014 in ER.  Developed pain, swelling, redness and admitted for cellulitis.  Now with drainage from sutured wound left knee.  Past Medical History  Diagnosis Date  . HYPOTHYROIDISM   . DM   . HYPERCHOLESTEROLEMIA   . HYPERLIPIDEMIA   . SUPRAVENTRICULAR TACHYCARDIA   . DYSPNEA   . CHEST PAIN   . Cellulitis   . Bipolar depression   . Neuropathy     Past Surgical History  Procedure Laterality Date  . Cesarean section    . Tubal ligation    . Neck surgery    . Foot surgery    . Tonsillectomy    . Nasal septum surgery      Family History  Problem Relation Age of Onset  . Hypertension Mother   . Cancer Father     Lung, died in his 34s  . COPD Mother     Died age 44    Social History:  reports that she quit smoking about 16 years ago. Her smoking use included Cigarettes. She quit after 15 years of use. She has never used smokeless tobacco. She reports that she does not drink alcohol or use illicit drugs.  Allergies:  Allergies  Allergen Reactions  . Zocor [Simvastatin]     Caused pain and hot flashes  . Sulfa Drugs Cross Reactors Rash    Medications: I have reviewed the patient's current medications.  Results for orders placed or performed during the hospital encounter of 08/14/14 (from the past 48 hour(s))  CBC with Differential     Status: None   Collection Time: 08/14/14 11:48 AM  Result Value Ref Range   WBC 8.4 4.0 - 10.5 K/uL   RBC 4.32 3.87 - 5.11 MIL/uL   Hemoglobin 14.0 12.0 - 15.0 g/dL   HCT 42.4 36.0 - 46.0 %   MCV 98.1 78.0 - 100.0 fL   MCH 32.4 26.0 - 34.0 pg   MCHC 33.0 30.0 - 36.0 g/dL   RDW 12.6 11.5 - 15.5 %   Platelets 225 150 - 400 K/uL   Neutrophils Relative % 55 43 - 77 %   Neutro Abs 4.6 1.7 - 7.7 K/uL   Lymphocytes Relative 32 12 - 46 %   Lymphs Abs 2.7 0.7 - 4.0 K/uL   Monocytes Relative 11 3 - 12 %   Monocytes Absolute 0.9 0.1 - 1.0 K/uL   Eosinophils Relative 2 0 - 5 %   Eosinophils Absolute 0.2 0.0 - 0.7 K/uL   Basophils Relative 0 0 - 1 %   Basophils Absolute 0.0 0.0 - 0.1 K/uL  Basic metabolic panel     Status: Abnormal   Collection Time: 08/14/14 11:48 AM  Result Value Ref Range   Sodium 135 135 - 145 mmol/L   Potassium 3.9 3.5 - 5.1 mmol/L   Chloride 100 96 - 112 mmol/L   CO2 24 19 - 32 mmol/L   Glucose, Bld 195 (H) 70 - 99 mg/dL   BUN 16 6 - 23 mg/dL   Creatinine, Ser 0.57 0.50 - 1.10 mg/dL   Calcium 9.0 8.4 - 10.5 mg/dL   GFR calc non Af Amer >90 >90 mL/min   GFR calc Af Amer >90 >  90 mL/min    Comment: (NOTE) The eGFR has been calculated using the CKD EPI equation. This calculation has not been validated in all clinical situations. eGFR's persistently <90 mL/min signify possible Chronic Kidney Disease.    Anion gap 11 5 - 15  Culture, blood (routine x 2)     Status: None (Preliminary result)   Collection Time: 08/14/14  2:09 PM  Result Value Ref Range   Specimen Description BLOOD RIGHT HAND    Special Requests BOTTLES DRAWN AEROBIC AND ANAEROBIC 5 CC EA    Culture             BLOOD CULTURE RECEIVED NO GROWTH TO DATE CULTURE WILL BE HELD FOR 5 DAYS BEFORE ISSUING A FINAL NEGATIVE REPORT Performed at [facility]    Report Status PENDING   MRSA PCR Screening     Status: None   Collection Time: 08/14/14  3:57 PM  Result Value Ref Range   MRSA by PCR NEGATIVE NEGATIVE    Comment:        The GeneXpert MRSA Assay (FDA approved for NASAL specimens only), is one component of a comprehensive MRSA colonization surveillance program. It is not intended to diagnose MRSA infection nor to guide or monitor treatment for MRSA infections.   Glucose, capillary     Status: Abnormal    Collection Time: 08/14/14  4:44 PM  Result Value Ref Range   Glucose-Capillary 166 (H) 70 - 99 mg/dL   Comment 1 Notify RN    Comment 2 Document in Chart   Glucose, capillary     Status: Abnormal   Collection Time: 08/14/14  9:41 PM  Result Value Ref Range   Glucose-Capillary 190 (H) 70 - 99 mg/dL  Basic metabolic panel     Status: Abnormal   Collection Time: 08/15/14  4:00 AM  Result Value Ref Range   Sodium 140 135 - 145 mmol/L   Potassium 4.2 3.5 - 5.1 mmol/L   Chloride 99 96 - 112 mmol/L   CO2 32 19 - 32 mmol/L   Glucose, Bld 184 (H) 70 - 99 mg/dL   BUN 11 6 - 23 mg/dL   Creatinine, Ser 0.71 0.50 - 1.10 mg/dL   Calcium 9.0 8.4 - 10.5 mg/dL   GFR calc non Af Amer >90 >90 mL/min   GFR calc Af Amer >90 >90 mL/min    Comment: (NOTE) The eGFR has been calculated using the CKD EPI equation. This calculation has not been validated in all clinical situations. eGFR's persistently <90 mL/min signify possible Chronic Kidney Disease.    Anion gap 9 5 - 15  Glucose, capillary     Status: Abnormal   Collection Time: 08/15/14  7:49 AM  Result Value Ref Range   Glucose-Capillary 167 (H) 70 - 99 mg/dL   Comment 1 Notify RN    Comment 2 Document in Chart   Glucose, capillary     Status: Abnormal   Collection Time: 08/15/14 11:34 AM  Result Value Ref Range   Glucose-Capillary 174 (H) 70 - 99 mg/dL   Comment 1 Notify RN    Comment 2 Document in Chart   Glucose, capillary     Status: Abnormal   Collection Time: 08/15/14  4:23 PM  Result Value Ref Range   Glucose-Capillary 152 (H) 70 - 99 mg/dL   Comment 1 Notify RN    Comment 2 Document in Chart   Glucose, capillary     Status: Abnormal   Collection Time: 08/15/14  10:16 PM  Result Value Ref Range   Glucose-Capillary 175 (H) 70 - 99 mg/dL   Comment 1 Notify RN   Glucose, capillary     Status: Abnormal   Collection Time: 08/16/14  7:33 AM  Result Value Ref Range   Glucose-Capillary 157 (H) 70 - 99 mg/dL   Comment 1 Notify RN     Comment 2 Document in Chart     No results found.  Review of Systems  Constitutional: Negative.   HENT: Negative.   Eyes: Negative.   Respiratory: Negative.   Cardiovascular: Negative.   Gastrointestinal: Negative.   Genitourinary: Negative.   Musculoskeletal: Positive for falls.  Skin:       Laceration left knee, redness left leg  Neurological: Negative.   Endo/Heme/Allergies: Negative.   Psychiatric/Behavioral: Negative.    Blood pressure 130/67, pulse 73, temperature 97.2 F (36.2 C), temperature source Oral, resp. rate 16, height 5' 9"  (1.753 m), weight 114.76 kg (253 lb), SpO2 98 %. Physical Exam  Constitutional: She is oriented to person, place, and time. She appears well-developed and well-nourished. No distress.  HENT:  Head: Normocephalic and atraumatic.  Right Ear: External ear normal.  Left Ear: External ear normal.  Eyes: Conjunctivae are normal. Pupils are equal, round, and reactive to light. No scleral icterus.  Musculoskeletal: Normal range of motion. She exhibits tenderness (left knee). She exhibits no edema.  Dry eschar over patellar tendon and scattered on left lower anterior leg; prolene sutures in place; reddish, purulent drainage from lateral aspect of laceration  Neurological: She is alert and oriented to person, place, and time.  Skin: Skin is warm and dry.  Psychiatric: She has a normal mood and affect. Her behavior is normal.   PROCEDURE: Gentle debridement of wound at bedside with instruments.  Three sutures removed.  Portion of eschar and devitalized tissue excised sharply ( 1cm x 1cm) allowing drainage of underlying tissues.  No foreign body identified.  Dressed wet to dry with 2x2 gauze and NS.   Assessment/Plan: Laceration left knee with subcutaneous abscess and cellulitis  Begin wet to dry dressing changes BID  Apply Neosporin ointment to eschar to soften and remove  Continue abx per medical service  I will change dressing in AM 4/10 -  husband will need to be instructed in dressing change upon discharge.  Earnstine Regal, MD, [facility] Office: [place] 08/16/2014, 11:02 AM

## 2014-08-17 LAB — GLUCOSE, CAPILLARY: GLUCOSE-CAPILLARY: 149 mg/dL — AB (ref 70–99)

## 2014-08-17 MED ORDER — HYDROCODONE-ACETAMINOPHEN 5-325 MG PO TABS: 1.0000 | ORAL_TABLET | ORAL | Status: DC | PRN

## 2014-08-17 MED ORDER — POLYETHYLENE GLYCOL 3350 17 G PO PACK: 17.0000 g | PACK | Freq: Every day | ORAL | Status: DC | PRN

## 2014-08-17 MED ORDER — ACETAMINOPHEN 325 MG PO TABS: 650.0000 mg | ORAL_TABLET | ORAL | Status: DC | PRN

## 2014-08-17 MED ORDER — CEPHALEXIN 500 MG PO CAPS: 500.0000 mg | ORAL_CAPSULE | Freq: Four times a day (QID) | ORAL | Status: DC

## 2014-08-17 MED ORDER — DOXYCYCLINE HYCLATE 100 MG PO TABS: 100.0000 mg | ORAL_TABLET | Freq: Two times a day (BID) | ORAL | Status: DC

## 2014-08-17 MED ORDER — RISAQUAD PO CAPS: 1.0000 | ORAL_CAPSULE | Freq: Every day | ORAL | Status: DC

## 2014-08-17 MED ORDER — BACITRACIN-NEOMYCIN-POLYMYXIN 400-5-5000 EX OINT: TOPICAL_OINTMENT | Freq: Three times a day (TID) | CUTANEOUS | Status: DC

## 2014-08-17 NOTE — Progress Notes (Signed)
Pt discharged home with spouse in stable condition. Discharge instructions and scripts given. Pt and wife verbalized understanding. Dressing change instructions given to wife and husband. Husband expressed understanding.

## 2014-08-17 NOTE — Discharge Summary (Signed)
Physician Discharge Summary  Veronica Rivers PVX:[id number] DOB: 06-30-1965 DOA: 08/14/2014  PCP: Gavin Pound, MD  Admit date: 08/14/2014 Discharge date: 08/17/2014  Time spent: 50 minutes  Recommendations for Outpatient Follow-up:  1. Follow-up with general surgery as needed  Discharge Condition: Stable Diet recommendation: Diabetic and heart healthy  Discharge Diagnoses:  Principal Problem:   Cellulitis and abscess of leg Active Problems:   Hypothyroidism   Type 2 diabetes mellitus   Elevated lipids   Essential hypertension   Diabetic neuropathy   Bipolar depression   History of present illness:  Veronica Rivers is a 49 y.o. female PMH of DM, hypothyroidism, hypertension, and prior cellulitis who sustained a laceration to her left knee on 4/4 after falling. She had sutures placed in the ER and was discharged home with keflex. She developed a cellulitis despite taking Keflex properly.   Hospital Course:  Principal Problem:  Cellulitis left knee and leg - Although the cellulitis was improving, on 4/8 pus began draining from wound- surgery was consulted and bedside I and D done-no culture sent- sutures removed-surgery has explained wound care to the patient and have left their number in case the wound does not heal appropriately at home-no further sutures needed according to the surgeon -Cellulitis improved with addition of IV Doxy and transitioning Keflex to Ancef- will  transition to oral.back to Keflex for one more week - have added FloraQ - blood cultures negative  Active Problems:  Hypothyroidism - cont Synthroid    Type 2 diabetes mellitus - cont Metformin, ISS   Elevated lipids -cont Crestor (on Mond and Thurs)   Essential hypertension - on Coreg   Diabetic neuropathy -cont Neurontin   Bipolar depression - stable on Lamictal, Abilify and Seroquel   Procedures:   4/9 bedside I and D  Consultations:   Surgery  Discharge Exam: Filed Weights    08/14/14 1009 08/14/14 1521  Weight: 114.76 kg (253 lb) 114.76 kg (253 lb)   Filed Vitals:   08/17/14 0635  BP: 124/64  Pulse: 64  Temp: 97.3 F (36.3 C)  Resp: 20    General: AAO x 3, no distress Cardiovascular: RRR, no murmurs  Respiratory: clear to auscultation bilaterally GI: soft, non-tender, non-distended, bowel sound positive  Discharge Instructions You were cared for by a hospitalist during your hospital stay. If you have any questions about your discharge medications or the care you received while you were in the hospital after you are discharged, you can call the unit and asked to speak with the hospitalist on call if the hospitalist that took care of you is not available. Once you are discharged, your primary care physician will handle any further medical issues. Please note that NO REFILLS for any discharge medications will be authorized once you are discharged, as it is imperative that you return to your primary care physician (or establish a relationship with a primary care physician if you do not have one) for your aftercare needs so that they can reassess your need for medications and monitor your lab values.  Discharge Instructions    Discharge instructions    Complete by:  As directed   Carb modified, heart healthy diet Follow up with Dr Harlow Asa if there are any problems with the wound     Increase activity slowly    Complete by:  As directed             Medication List    STOP taking these medications  naproxen 500 MG tablet  Commonly known as:  NAPROSYN      TAKE these medications        ABILIFY 15 MG tablet  Generic drug:  ARIPiprazole  Take 1 tablet by mouth Daily.     acetaminophen 325 MG tablet  Commonly known as:  TYLENOL  Take 2 tablets (650 mg total) by mouth every 4 (four) hours as needed for mild pain, fever or headache.     acidophilus Caps capsule  Take 1 capsule by mouth daily.     albuterol 108 (90 BASE) MCG/ACT inhaler   Commonly known as:  PROVENTIL HFA;VENTOLIN HFA  Inhale 1-2 puffs into the lungs every 4 (four) hours as needed for wheezing or shortness of breath (wheezing and shortness of breath).     atorvastatin 20 MG tablet  Commonly known as:  LIPITOR  Take 1 tablet (20 mg total) by mouth daily.     carvedilol 12.5 MG tablet  Commonly known as:  COREG  TAKE 1 TABLET TWICE DAILY  WITH MEALS     carvedilol 12.5 MG tablet  Commonly known as:  COREG  TAKE 1 TABLET TWICE DAILY  WITH MEALS     cephALEXin 500 MG capsule  Commonly known as:  KEFLEX  Take 1 capsule (500 mg total) by mouth 4 (four) times daily.     cetirizine 10 MG tablet  Commonly known as:  ZYRTEC  Take 10 mg by mouth at bedtime.     doxycycline 100 MG tablet  Commonly known as:  VIBRA-TABS  Take 1 tablet (100 mg total) by mouth 2 (two) times daily.     fluticasone 50 MCG/ACT nasal spray  Commonly known as:  FLONASE  Place 2 sprays into both nostrils daily.     gabapentin 300 MG capsule  Commonly known as:  NEURONTIN  Take 1 capsule by mouth 3 (three) times daily.     HYDROcodone-acetaminophen 5-325 MG per tablet  Commonly known as:  NORCO/VICODIN  Take 1 tablet by mouth every 4 (four) hours as needed.     LamoTRIgine 300 MG Tb24  Take 300 mg by mouth daily.     levothyroxine 100 MCG tablet  Commonly known as:  SYNTHROID, LEVOTHROID  Take 100 mcg by mouth at bedtime.     LORazepam 0.5 MG tablet  Commonly known as:  ATIVAN  Take 1 tablet by mouth 4 (four) times daily as needed for anxiety.     metFORMIN 1000 MG tablet  Commonly known as:  GLUCOPHAGE  Take 1,000 mg by mouth daily with breakfast.     methocarbamol 750 MG tablet  Commonly known as:  ROBAXIN  Take 1 tablet by mouth every 8 (eight) hours as needed for muscle spasms (muscle spasms).     montelukast 10 MG tablet  Commonly known as:  SINGULAIR  Take 10 mg by mouth at bedtime.     mupirocin ointment 2 %  Commonly known as:  BACTROBAN  Place 1  application into the nose 2 (two) times daily.     neomycin-bacitracin-polymyxin ointment  Commonly known as:  NEOSPORIN  Apply topically 3 (three) times daily. apply to eye     polyethylene glycol packet  Commonly known as:  MIRALAX / GLYCOLAX  Take 17 g by mouth daily as needed for mild constipation.     QUEtiapine 400 MG 24 hr tablet  Commonly known as:  SEROQUEL XR  Take 400 mg by mouth at bedtime.     ranitidine  300 MG tablet  Commonly known as:  ZANTAC  Take 300 mg by mouth daily as needed for heartburn (heartburn).     rosuvastatin 10 MG tablet  Commonly known as:  CRESTOR  Take 1 tablet (10 mg total) by mouth 2 (two) times a week.     SYMBICORT 160-4.5 MCG/ACT inhaler  Generic drug:  budesonide-formoterol  Inhale 2 puffs into the lungs 2 (two) times daily.                Allergies  Allergen Reactions  . Zocor [Simvastatin]     Caused pain and hot flashes  . Sulfa Drugs Cross Reactors Rash       Follow-up Information    Follow up with Earnstine Regal, MD.   Specialty:  General Surgery   Why:  As needed   Contact information:   [address] [place] [address] [phone]        The results of significant diagnostics from this hospitalization (including imaging, microbiology, ancillary and laboratory) are listed below for reference.    Significant Diagnostic Studies: Dg Chest 2 View  07/18/2014   CLINICAL DATA:  Dry cough for 3 months, shortness of Breath  EXAM: CHEST  2 VIEW  COMPARISON:  08/07/2013  FINDINGS: Cardiomediastinal silhouette is stable. No acute infiltrate or pleural effusion. No pulmonary edema. Mild degenerative changes mid and lower thoracic spine.  IMPRESSION: No active cardiopulmonary disease.   Electronically Signed   By: Lahoma Crocker M.D.   On: 07/18/2014 12:52   Dg Elbow Complete Left  08/11/2014   CLINICAL DATA:  Tripping injury, left elbow pain, initial encounter.  EXAM: LEFT ELBOW - COMPLETE 3+ VIEW  COMPARISON:   None.  FINDINGS: No acute osseous or joint abnormality. Small exostosis is seen off the distal humeral meta diaphysis.  IMPRESSION: No acute osseous abnormality.   Electronically Signed   By: Lorin Picket M.D.   On: 08/11/2014 13:27   Dg Knee Complete 4 Views Left  08/11/2014   CLINICAL DATA:  Status post a fall with direct trauma the knee is well as cutaneous laceration  EXAM: LEFT KNEE - COMPLETE 4+ VIEW  COMPARISON:  Left knee series dated November 09, 2012  FINDINGS: The bones of the knee are adequately mineralized. There is beaking of the tibial spines. The joint spaces are preserved. There is a tiny spur from the superior margin of the patella. A small suprapatellar effusion is suspected. There is soft tissue swelling and contour deformity in the prepatellar region. The proximal fibula is intact.  IMPRESSION: There is no acute fracture nor dislocation of the left knee. Minimal osteoarthritic changes are present. There is a small suprapatellar effusion.   Electronically Signed   By: David  Martinique   On: 08/11/2014 13:03   Dg Finger Middle Right  08/11/2014   CLINICAL DATA:  Status post tripping injury and following with pain over the right middle finger  EXAM: RIGHT MIDDLE FINGER 2+V  COMPARISON:  None.  FINDINGS: The bones of the right middle finger are adequately mineralized. There is no acute fracture. There is a small amount of soft tissue gas over the ventral aspect of the middle phalanx which may reflect the sequelae of the injury.  IMPRESSION: There is no acute bony abnormality of the right third or long finger.   Electronically Signed   By: David  Martin[place]   On: 08/11/2014 13:04    Microbiology: Recent Results (from the past 240 hour(s))  Culture,  blood (routine x 2)     Status: None (Preliminary result)   Collection Time: 08/14/14  2:09 PM  Result Value Ref Range Status   Specimen Description BLOOD RIGHT HAND  Final   Special Requests BOTTLES DRAWN AEROBIC AND ANAEROBIC 5 CC EA  Final    Culture   Final           BLOOD CULTURE RECEIVED NO GROWTH TO DATE CULTURE WILL BE HELD FOR 5 DAYS BEFORE ISSUING A FINAL NEGATIVE REPORT Performed at [facility]    Report Status PENDING  Incomplete  MRSA PCR Screening     Status: None   Collection Time: 08/14/14  3:57 PM  Result Value Ref Range Status   MRSA by PCR NEGATIVE NEGATIVE Final    Comment:        The GeneXpert MRSA Assay (FDA approved for NASAL specimens only), is one component of a comprehensive MRSA colonization surveillance program. It is not intended to diagnose MRSA infection nor to guide or monitor treatment for MRSA infections.      Labs: Basic Metabolic Panel:  Recent Labs Lab 08/14/14 1148 08/15/14 0400  NA 135 140  K 3.9 4.2  CL 100 99  CO2 24 32  GLUCOSE 195* 184*  BUN 16 11  CREATININE 0.57 0.71  CALCIUM 9.0 9.0   Liver Function Tests: No results for input(s): AST, ALT, ALKPHOS, BILITOT, PROT, ALBUMIN in the last 168 hours. No results for input(s): LIPASE, AMYLASE in the last 168 hours. No results for input(s): AMMONIA in the last 168 hours. CBC:  Recent Labs Lab 08/14/14 1148  WBC 8.4  NEUTROABS 4.6  HGB 14.0  HCT 42.4  MCV 98.1  PLT 225   Cardiac Enzymes: No results for input(s): CKTOTAL, CKMB, CKMBINDEX, TROPONINI in the last 168 hours. BNP: BNP (last 3 results) No results for input(s): BNP in the last 8760 hours.  ProBNP (last 3 results) No results for input(s): PROBNP in the last 8760 hours.  CBG:  Recent Labs Lab 08/16/14 0733 08/16/14 1148 08/16/14 1651 08/16/14 2137 08/17/14 0752  GLUCAP 157* 185* 103* 172* 149*       SignedDebbe Odea, MD [facility] 08/17/2014, 8:24 AM

## 2014-08-17 NOTE — Progress Notes (Signed)
Patient ID: Veronica Rivers, female   DOB: 1965-09-23, 49 y.o.   MRN: [id number]  [facility]  Subjective: Patient comfortable, no complaints.  Objective: Vital signs in last 24 hours: Temp:  [97.3 F (36.3 C)-98.4 F (36.9 C)] 97.3 F (36.3 C) (04/10 0635) Pulse Rate:  [64-73] 64 (04/10 0635) Resp:  [18-20] 20 (04/10 0635) BP: (124-135)/(61-68) 124/64 mmHg (04/10 0635) SpO2:  [91 %-98 %] 96 % (04/10 0635) Last BM Date: 08/14/14  Intake/Output from previous day: 04/09 0701 - 04/10 0700 In: 1080 [P.O.:1080] Out: 900 [Urine:900] Intake/Output this shift:    Physical Exam: HEENT - sclerae clear, mucous membranes moist Ext - left knee wound cleansed with Qtip and saline and wet to dry dressing replaced; small drainage noted; no recurrent cellulitis Neuro - alert & oriented, no focal deficits  Lab Results:   Recent Labs  08/14/14 1148  WBC 8.4  HGB 14.0  HCT 42.4  PLT 225   BMET  Recent Labs  08/14/14 1148 08/15/14 0400  NA 135 140  K 3.9 4.2  CL 100 99  CO2 24 32  GLUCOSE 195* 184*  BUN 16 11  CREATININE 0.57 0.71  CALCIUM 9.0 9.0   PT/INR No results for input(s): LABPROT, INR in the last 72 hours. Comprehensive Metabolic Panel:    Component Value Date/Time   NA 140 08/15/2014 0400   NA 135 08/14/2014 1148   K 4.2 08/15/2014 0400   K 3.9 08/14/2014 1148   CL 99 08/15/2014 0400   CL 100 08/14/2014 1148   CO2 32 08/15/2014 0400   CO2 24 08/14/2014 1148   BUN 11 08/15/2014 0400   BUN 16 08/14/2014 1148   CREATININE 0.71 08/15/2014 0400   CREATININE 0.57 08/14/2014 1148   GLUCOSE 184* 08/15/2014 0400   GLUCOSE 195* 08/14/2014 1148   CALCIUM 9.0 08/15/2014 0400   CALCIUM 9.0 08/14/2014 1148   AST 19 07/02/2014 1029   AST 22 12/31/2013 1306   ALT 33 07/02/2014 1029   ALT 42* 12/31/2013 1306   ALKPHOS 88 07/02/2014 1029   ALKPHOS 109 12/31/2013 1306   BILITOT 0.3 07/02/2014 1029   BILITOT 0.3 12/31/2013 1306   PROT 6.7 07/02/2014 1029   PROT 7.7 12/31/2013 1306   ALBUMIN 4.2 07/02/2014 1029   ALBUMIN 4.3 12/31/2013 1306    Studies/Results: No results found.  Anti-infectives: Anti-infectives    Start     Dose/Rate Route Frequency Ordered Stop   08/14/14 2200  ceFAZolin (ANCEF) IVPB 1 g/50 mL premix     1 g 100 mL/hr over 30 Minutes Intravenous 3 times per day 08/14/14 1536     08/14/14 1600  doxycycline (VIBRAMYCIN) 100 mg in dextrose 5 % 250 mL IVPB     100 mg 125 mL/hr over 120 Minutes Intravenous Every 12 hours 08/14/14 1536     08/14/14 1400  ceFAZolin (ANCEF) IVPB 1 g/50 mL premix     1 g 100 mL/hr over 30 Minutes Intravenous  Once 08/14/14 1357 08/14/14 1458      Assessment & Plans: Laceration left knee with cellulitis  Continue wet to dry dressing changes BID as instructed  Daily shower with wound open  Antibiotics per medical service  Neosporin ointment to abrasions and wound until epithelialized, then Palmer's cocoa butter  Follow up at [employer] office if needed  Earnstine Regal, MD, [facility] Office: [place] 08/17/2014

## 2014-08-17 NOTE — Discharge Instructions (Signed)
Wound Care for left knee laceration             Continue wet to dry dressing changes BID as instructed  Daily shower with wound open  Antibiotics per medical service  Neosporin ointment to abrasions and wound until epithelialized, then Palmer's cocoa butter  Follow up at [place] office if needed Earnstine Regal, MD, [facility] Office: [phone]

## 2014-08-20 LAB — CULTURE, BLOOD (ROUTINE X 2): Culture: NO GROWTH

## 2014-10-21 NOTE — Progress Notes (Signed)
Patient ID: TRACYANN DUFFELL, female   DOB: August 13, 1965, 49 y.o.   MRN: [id number] Makisha is seen today for f.u of HTN, elevated lipids atypicdal chest pain history of SVT. She has been doing well. Some vetigo on antivert . She eats too many carbs and we discussed her diet and weight loss at length. She had an normal echo in 2009. She is not having recurrent palpitations or SSCP. She has been compliant with her meds. She had an LDL of 64 at her primaries recently with normal LFT's and TSH  Seen 4/15 with dyspnea  CXR 08/07/13 NAD   Echo 08/12/13 essentially unremarkable  Study Conclusions  - Left ventricle: The cavity size was normal. There was mild concentric hypertrophy. Systolic function was normal. The estimated ejection fraction was in the range of 60% to 65%. Wall motion was normal; there were no regional wall motion abnormalities. Left ventricular diastolic function parameters were normal. - Left atrium: The atrium was normal in size. - Right ventricle: The cavity size was mildly dilated. Wall thickness was normal. Systolic function was normal. Lateral annulus peak S velocity: 13.2cm/s. - Right atrium: The atrium was mildly dilated. - Atrial septum: No defect or patent foramen ovale was identified. - Tricuspid valve: Poorly visualized. No significant regurgitation. Unable to calculate RVSP. - Inferior vena cava: The vessel was normal in size; the respirophasic diameter changes were in the normal range (= 50%); findings are consistent with normal central venous pressure. - Pericardium, extracardiac: There was no pericardial effusion  Lab Results  Component Value Date   LDLCALC 70 07/02/2014  Seeing lipid clinic and intolerant to a lot of statins   Husband lost job at [employer].  She hurt left knee walking in woods and needed iv antibiotics  ROS: Denies fever, malais, weight loss, blurry vision, decreased visual acuity, cough, sputum, SOB, hemoptysis, pleuritic  pain, palpitaitons, heartburn, abdominal pain, melena, lower extremity edema, claudication, or rash.  All other systems reviewed and negative  General: Affect appropriate Obese white female  HEENT: normal Neck supple with no adenopathy JVP normal no bruits no thyromegaly Lungs clear with no wheezing and good diaphragmatic motion Heart:  S1/S2 no murmur, no rub, gallop or click PMI normal Abdomen: benighn, BS positve, no tenderness, no AAA no bruit.  No HSM or HJR Distal pulses intact with no bruits No edema Neuro non-focal Skin warm and dry scar left knee from fall  No muscular weakness   Current Outpatient Prescriptions  Medication Sig Dispense Refill  . ABILIFY 15 MG tablet Take 1 tablet by mouth Daily.    [name] albuterol (PROVENTIL HFA;VENTOLIN HFA) 108 (90 BASE) MCG/ACT inhaler Inhale 1-2 puffs into the lungs every 4 (four) hours as needed for wheezing or shortness of breath (wheezing and shortness of breath).     . Azelastine-Fluticasone (DYMISTA NA) Place 50 mcg into the nose daily.    . carvedilol (COREG) 12.5 MG tablet TAKE 1 TABLET TWICE DAILY  WITH MEALS 180 tablet 0  . gabapentin (NEURONTIN) 300 MG capsule Take 1 capsule by mouth 3 (three) times daily.  0  . LamoTRIgine 300 MG TB24 Take 300 mg by mouth daily.    [name] levocetirizine (XYZAL) 5 MG tablet Take 5 mg by mouth every evening.    [name] levothyroxine (SYNTHROID, LEVOTHROID) 100 MCG tablet Take 100 mcg by mouth at bedtime.     . metFORMIN (GLUCOPHAGE) 1000 MG tablet Take 1,000 mg by mouth daily with breakfast.    . montelukast (SINGULAIR) 10  MG tablet Take 10 mg by mouth at bedtime.    [name] oxyCODONE-acetaminophen (PERCOCET/ROXICET) 5-325 MG per tablet Take 1 tablet by mouth every 6 (six) hours as needed for moderate pain (pain).     . QUEtiapine (SEROQUEL XR) 400 MG 24 hr tablet Take 400 mg by mouth at bedtime.    . rosuvastatin (CRESTOR) 10 MG tablet Take 1 tablet (10 mg total) by mouth 2 (two) times a week. 15 tablet 3  .  SYMBICORT 160-4.5 MCG/ACT inhaler Inhale 2 puffs into the lungs 2 (two) times daily.  2   No current facility-administered medications for this visit.    Allergies  Zocor and Sulfa drugs cross reactors  Electrocardiogram:  08/07/13  SR rate 98 poor R wave progression nonspecific ST/T wave changes  10/27/14  SR rate 78 low voltage nonspecific ST changes no change from 2015   Assessment and Plan HTN:  nlbp DM:  Discussed low carb diet.  Target hemoglobin A1c is 6.5 or less.  Continue current medications. Chol:   Cholesterol is at goal.  Continue current dose of statin and diet Rx.  No myalgias or side effects.  F/U  LFT's in 6 months. Lab Results  Component Value Date   LDLCALC 70 07/02/2014            SVT:  Quiescent infrequent palpitaitons continue beta blocker Chest Pain:  Resolved previously normal ETT  Observe  F/U 1 year  [name]

## 2014-10-27 ENCOUNTER — Ambulatory Visit (INDEPENDENT_AMBULATORY_CARE_PROVIDER_SITE_OTHER): Payer: BLUE CROSS/BLUE SHIELD | Admitting: Cardiovascular Disease

## 2014-10-27 ENCOUNTER — Encounter: Payer: Self-pay | Admitting: Cardiovascular Disease

## 2014-10-27 VITALS — BP 114/68 | HR 76 | Ht 69.5 in | Wt 257.8 lb

## 2014-10-27 DIAGNOSIS — R0789 Other chest pain: Secondary | ICD-10-CM

## 2014-10-27 NOTE — Patient Instructions (Signed)
Your physician wants you to follow-up in: YEAR WITH DR NISHAN  You will receive a reminder letter in the mail two months in advance. If you don't receive a letter, please call our office to schedule the follow-up appointment.  Your physician recommends that you continue on your current medications as directed. Please refer to the Current Medication list given to you today. 

## 2014-11-18 ENCOUNTER — Other Ambulatory Visit: Payer: Self-pay | Admitting: *Deleted

## 2014-11-18 MED ORDER — CARVEDILOL 12.5 MG PO TABS: ORAL_TABLET | ORAL | Status: DC

## 2015-05-02 ENCOUNTER — Other Ambulatory Visit: Payer: Self-pay | Admitting: Cardiovascular Disease

## 2015-05-14 ENCOUNTER — Emergency Department (HOSPITAL_COMMUNITY)
Admission: EM | Admit: 2015-05-14 | Discharge: 2015-05-15 | Discharge status: Against Medical Advice | Payer: BLUE CROSS/BLUE SHIELD | Attending: Emergency Medicine | Admitting: Emergency Medicine

## 2015-05-14 ENCOUNTER — Encounter (HOSPITAL_COMMUNITY): Payer: Self-pay | Admitting: Emergency Medicine

## 2015-05-14 ENCOUNTER — Emergency Department (HOSPITAL_COMMUNITY): Payer: BLUE CROSS/BLUE SHIELD

## 2015-05-14 DIAGNOSIS — J45901 Unspecified asthma with (acute) exacerbation: Secondary | ICD-10-CM | POA: Insufficient documentation

## 2015-05-14 DIAGNOSIS — E119 Type 2 diabetes mellitus without complications: Secondary | ICD-10-CM | POA: Diagnosis not present

## 2015-05-14 DIAGNOSIS — R0602 Shortness of breath: Secondary | ICD-10-CM | POA: Diagnosis present

## 2015-05-14 HISTORY — DX: Unspecified asthma, uncomplicated: J45.909

## 2015-05-14 NOTE — ED Notes (Signed)
Difficulty breathing intermittently throughout the day, began worsening after 6. Hx of asthma, has been using rescue inhaler with little relief. Has had a non productive cough with nasal congestion for a few days. Denies pain, N/V/D, fever. Lungs clear throughout in triage.

## 2015-05-14 NOTE — ED Notes (Signed)
EKG done at 2150 and given to Dr Dolly Rias

## 2015-05-15 NOTE — ED Notes (Signed)
Pt called from triage, no answer times two

## 2015-05-15 NOTE — ED Notes (Signed)
No answer from waiting room.

## 2015-05-19 ENCOUNTER — Emergency Department (HOSPITAL_COMMUNITY)
Admission: EM | Admit: 2015-05-19 | Discharge: 2015-05-19 | Disposition: A | Discharge status: Home / Self Care | Payer: BLUE CROSS/BLUE SHIELD | Attending: Emergency Medicine | Admitting: Emergency Medicine

## 2015-05-19 ENCOUNTER — Encounter (HOSPITAL_COMMUNITY): Payer: Self-pay

## 2015-05-19 ENCOUNTER — Other Ambulatory Visit (HOSPITAL_COMMUNITY): Payer: BLUE CROSS/BLUE SHIELD

## 2015-05-19 ENCOUNTER — Emergency Department (HOSPITAL_COMMUNITY): Payer: BLUE CROSS/BLUE SHIELD

## 2015-05-19 DIAGNOSIS — E1165 Type 2 diabetes mellitus with hyperglycemia: Secondary | ICD-10-CM | POA: Diagnosis not present

## 2015-05-19 DIAGNOSIS — Z87891 Personal history of nicotine dependence: Secondary | ICD-10-CM | POA: Insufficient documentation

## 2015-05-19 DIAGNOSIS — R079 Chest pain, unspecified: Secondary | ICD-10-CM | POA: Diagnosis present

## 2015-05-19 DIAGNOSIS — D849 Immunodeficiency, unspecified: Secondary | ICD-10-CM | POA: Insufficient documentation

## 2015-05-19 DIAGNOSIS — G629 Polyneuropathy, unspecified: Secondary | ICD-10-CM | POA: Insufficient documentation

## 2015-05-19 DIAGNOSIS — Z872 Personal history of diseases of the skin and subcutaneous tissue: Secondary | ICD-10-CM | POA: Insufficient documentation

## 2015-05-19 DIAGNOSIS — Z79899 Other long term (current) drug therapy: Secondary | ICD-10-CM | POA: Insufficient documentation

## 2015-05-19 DIAGNOSIS — R0789 Other chest pain: Secondary | ICD-10-CM

## 2015-05-19 DIAGNOSIS — R0602 Shortness of breath: Secondary | ICD-10-CM

## 2015-05-19 DIAGNOSIS — Z7951 Long term (current) use of inhaled steroids: Secondary | ICD-10-CM | POA: Insufficient documentation

## 2015-05-19 DIAGNOSIS — F319 Bipolar disorder, unspecified: Secondary | ICD-10-CM | POA: Diagnosis not present

## 2015-05-19 DIAGNOSIS — E7800 Pure hypercholesterolemia, unspecified: Secondary | ICD-10-CM | POA: Insufficient documentation

## 2015-05-19 DIAGNOSIS — J45909 Unspecified asthma, uncomplicated: Secondary | ICD-10-CM

## 2015-05-19 DIAGNOSIS — Z8679 Personal history of other diseases of the circulatory system: Secondary | ICD-10-CM | POA: Insufficient documentation

## 2015-05-19 DIAGNOSIS — E785 Hyperlipidemia, unspecified: Secondary | ICD-10-CM | POA: Insufficient documentation

## 2015-05-19 DIAGNOSIS — E039 Hypothyroidism, unspecified: Secondary | ICD-10-CM | POA: Insufficient documentation

## 2015-05-19 DIAGNOSIS — J45901 Unspecified asthma with (acute) exacerbation: Secondary | ICD-10-CM | POA: Diagnosis not present

## 2015-05-19 DIAGNOSIS — R739 Hyperglycemia, unspecified: Secondary | ICD-10-CM

## 2015-05-19 LAB — BASIC METABOLIC PANEL
Anion gap: 9 (ref 5–15)
BUN: 11 mg/dL (ref 6–20)
CO2: 30 mmol/L (ref 22–32)
Calcium: 9.7 mg/dL (ref 8.9–10.3)
Chloride: 103 mmol/L (ref 101–111)
Creatinine, Ser: 0.77 mg/dL (ref 0.44–1.00)
Glucose, Bld: 197 mg/dL — ABNORMAL HIGH (ref 65–99)
POTASSIUM: 4.5 mmol/L (ref 3.5–5.1)
SODIUM: 142 mmol/L (ref 135–145)

## 2015-05-19 LAB — CBC
HEMATOCRIT: 45.4 % (ref 36.0–46.0)
Hemoglobin: 14.5 g/dL (ref 12.0–15.0)
MCH: 31 pg (ref 26.0–34.0)
MCHC: 31.9 g/dL (ref 30.0–36.0)
MCV: 97.2 fL (ref 78.0–100.0)
PLATELETS: 250 10*3/uL (ref 150–400)
RBC: 4.67 MIL/uL (ref 3.87–5.11)
RDW: 13.5 % (ref 11.5–15.5)
WBC: 7.6 10*3/uL (ref 4.0–10.5)

## 2015-05-19 LAB — I-STAT TROPONIN, ED: Troponin i, poc: 0 ng/mL (ref 0.00–0.08)

## 2015-05-19 MED ORDER — PREDNISONE 20 MG PO TABS: ORAL_TABLET | ORAL | Status: DC

## 2015-05-19 MED ORDER — PREDNISONE 20 MG PO TABS
60.0000 mg | ORAL_TABLET | Freq: Once | ORAL | Status: AC
  Administered 2015-05-19: 60 mg via ORAL
  Filled 2015-05-19: qty 3

## 2015-05-19 NOTE — ED Provider Notes (Signed)
CSN: [id number]     Arrival date & time 05/19/15  1202 History   First MD Initiated Contact with Patient 05/19/15 [phone]     Chief Complaint  Patient presents with  . Chest Pain  . Shortness of Breath     (Consider location/radiation/quality/duration/timing/severity/associated sxs/prior Treatment) HPI Comments: Veronica Rivers is a 50 y.o. female with a PMHx of hypothyroidism, DM2, HLD, supraventricular tachycardia, chronic CP and dyspnea, bipolar disorder, neuropathy, and asthma, who presents to the ED with complaints of chest pain and shortness breath 1 week. She describes her chest pain is 5/10 central intermittent aching which is nonradiating, worse with movement and coughing, and unrelieved with home Percocet. Associated symptoms include shortness of breath that improves with her home inhaler, nasal congestion, yellow rhinorrhea, dry cough, and wheezing. She currently has had resolution of her chest pain. She states this feels similar to her asthma.  She denies any fevers, chills, sore throat, eye pain or drainage, ear pain or drainage, leg swelling, recent travel/surgery/immobilization, history of DVT/PE, estrogen use, hemoptysis, claudication, orthopnea, diaphoresis, lightheadedness, abdominal pain, nausea vomiting, diarrhea, constipation, dysuria, hematuria, numbness, tingling, or weakness. Positive MI in her mother, patient is a nonsmoker.  Patient is a 50 y.o. female presenting with chest pain and shortness of breath. The history is provided by the patient and medical records. No language interpreter was used.  Chest Pain Pain location:  Substernal area Pain quality: aching   Pain radiates to:  Does not radiate Pain radiates to the back: no   Pain severity:  Mild Onset quality:  Gradual Duration:  1 week Timing:  Intermittent Progression:  Resolved Chronicity:  New Context: at rest   Relieved by: percocet. Worsened by:  Coughing and movement Ineffective treatments:  None  tried Associated symptoms: cough and shortness of breath   Associated symptoms: no abdominal pain, no claudication, no diaphoresis, no fever, no lower extremity edema, no nausea, no numbness, no orthopnea, not vomiting and no weakness   Risk factors: diabetes mellitus and high cholesterol   Risk factors: no birth control, no coronary artery disease, no hypertension, no immobilization, no prior DVT/PE, no smoking and no surgery   Shortness of Breath Associated symptoms: chest pain, cough and wheezing   Associated symptoms: no abdominal pain, no claudication, no diaphoresis, no ear pain, no fever, no sore throat and no vomiting     Past Medical History  Diagnosis Date  . HYPOTHYROIDISM   . DM   . HYPERCHOLESTEROLEMIA   . HYPERLIPIDEMIA   . SUPRAVENTRICULAR TACHYCARDIA   . DYSPNEA   . CHEST PAIN   . Cellulitis   . Bipolar depression ([place])   . Neuropathy ([place])   . Asthma    Past Surgical History  Procedure Laterality Date  . Cesarean section    . Tubal ligation    . Neck surgery    . Foot surgery    . Tonsillectomy    . Nasal septum surgery     Family History  Problem Relation Age of Onset  . Hypertension Mother   . Cancer Father     Lung, died in his 79s  . COPD Mother     Died age 28   Social History  Substance Use Topics  . Smoking status: Former Smoker -- 15 years    Types: Cigarettes    Quit date: 11/23/1997  . Smokeless tobacco: Never Used  . Alcohol Use: No   OB History    No data available  Review of Systems  Constitutional: Negative for fever, chills and diaphoresis.  HENT: Positive for rhinorrhea and sinus pressure. Negative for ear discharge, ear pain and sore throat.   Eyes: Negative for redness and itching.  Respiratory: Positive for cough, shortness of breath and wheezing.   Cardiovascular: Positive for chest pain. Negative for orthopnea, claudication and leg swelling.  Gastrointestinal: Negative for nausea, vomiting, abdominal pain, diarrhea  and constipation.  Genitourinary: Negative for dysuria and hematuria.  Musculoskeletal: Negative for myalgias and arthralgias.  Skin: Negative for color change.  Allergic/Immunologic: Positive for immunocompromised state (diabetic).  Neurological: Negative for weakness, light-headedness and numbness.  Psychiatric/Behavioral: Negative for confusion.   10 Systems reviewed and are negative for acute change except as noted in the HPI.    Allergies  Zocor and Sulfa drugs cross reactors  Home Medications   Prior to Admission medications   Medication Sig Start Date End Date Taking? Authorizing Provider  ABILIFY 15 MG tablet Take 1 tablet by mouth Daily. 08/10/11   Historical Provider, MD  albuterol (PROVENTIL HFA;VENTOLIN HFA) 108 (90 BASE) MCG/ACT inhaler Inhale 1-2 puffs into the lungs every 4 (four) hours as needed for wheezing or shortness of breath (wheezing and shortness of breath).     Historical Provider, MD  Azelastine-Fluticasone (DYMISTA NA) Place 50 mcg into the nose daily.    Historical Provider, MD  carvedilol (COREG) 12.5 MG tablet TAKE 1 TABLET TWICE DAILY  WITH MEALS 11/18/14   Josue Hector, MD  gabapentin (NEURONTIN) 300 MG capsule Take 1 capsule by mouth 3 (three) times daily. 04/21/14   Historical Provider, MD  LamoTRIgine 300 MG TB24 Take 300 mg by mouth daily.    Historical Provider, MD  levocetirizine (XYZAL) 5 MG tablet Take 5 mg by mouth every evening.    Historical Provider, MD  levothyroxine (SYNTHROID, LEVOTHROID) 100 MCG tablet Take 100 mcg by mouth at bedtime.     Historical Provider, MD  metFORMIN (GLUCOPHAGE) 1000 MG tablet Take 1,000 mg by mouth daily with breakfast.    Historical Provider, MD  montelukast (SINGULAIR) 10 MG tablet Take 10 mg by mouth at bedtime.    Historical Provider, MD  oxyCODONE-acetaminophen (PERCOCET/ROXICET) 5-325 MG per tablet Take 1 tablet by mouth every 6 (six) hours as needed for moderate pain (pain).  11/27/12   Venetia Maxon Rama, MD   QUEtiapine (SEROQUEL XR) 400 MG 24 hr tablet Take 400 mg by mouth at bedtime.    Historical Provider, MD  rosuvastatin (CRESTOR) 10 MG tablet TAKE 1 TABLET BY MOUTH 2 TIMES A WEEK 05/05/15   Josue Hector, MD  SYMBICORT 160-4.5 MCG/ACT inhaler Inhale 2 puffs into the lungs 2 (two) times daily. 07/30/14   Historical Provider, MD   BP 133/88 mmHg  Pulse 84  Temp(Src) 97.8 F (36.6 C) (Oral)  Resp 16  SpO2 96%   Physical Exam  Constitutional: She is oriented to person, place, and time. Vital signs are normal. She appears well-developed and well-nourished.  Non-toxic appearance. No distress.  Afebrile, nontoxic, NAD  HENT:  Head: Normocephalic and atraumatic.  Nose: Mucosal edema and rhinorrhea present.  Mouth/Throat: Oropharynx is clear and moist and mucous membranes are normal.  Nose with mild rhinorrhea and mucosal edema.  Oropharynx clear and moist, without uvular swelling or deviation, no trismus or drooling, no tonsillar swelling or erythema, no exudates.    Eyes: Conjunctivae and EOM are normal. Right eye exhibits no discharge. Left eye exhibits no discharge.  Neck: Normal range of  motion. Neck supple.  Cardiovascular: Normal rate, regular rhythm, normal heart sounds and intact distal pulses.  Exam reveals no gallop and no friction rub.   No murmur heard. RRR, nl s1/s2, no m/r/g, distal pulses intact, no pedal edema   Pulmonary/Chest: Effort normal and breath sounds normal. No respiratory distress. She has no decreased breath sounds. She has no wheezes. She has no rhonchi. She has no rales. She exhibits tenderness. She exhibits no crepitus, no deformity and no retraction.    CTAB in all lung fields, no w/r/r, no hypoxia or increased WOB, speaking in full sentences, SpO2 96-98% on RA  Mild anterior chest wall TTP, no crepitus or deformity, no retractions  Abdominal: Soft. Normal appearance and bowel sounds are normal. She exhibits no distension. There is no tenderness. There is no  rigidity, no rebound, no guarding, no CVA tenderness, no tenderness at McBurney's point and negative Murphy's sign.  Musculoskeletal: Normal range of motion.  MAE x4 Strength and sensation grossly intact Distal pulses intact No pedal edema, neg homan's bilaterally   Neurological: She is alert and oriented to person, place, and time. She has normal strength. No sensory deficit.  Skin: Skin is warm, dry and intact. No rash noted.  Psychiatric: She has a normal mood and affect.  Nursing note and vitals reviewed.   ED Course  Procedures (including critical care time) Labs Review Labs Reviewed  BASIC METABOLIC PANEL - Abnormal; Notable for the following:    Glucose, Bld 197 (*)    All other components within normal limits  CBC  I-STAT TROPOININ, ED    Imaging Review Dg Chest 2 View  05/19/2015  CLINICAL DATA:  Cough, shortness of breath EXAM: CHEST  2 VIEW COMPARISON:  05/14/2015 FINDINGS: There is no focal parenchymal opacity. There is no pleural effusion or pneumothorax. The heart and mediastinal contours are unremarkable. There is lower anterior cervical fusion. IMPRESSION: No active cardiopulmonary disease. Electronically Signed   By: Kathreen Devoid   On: 05/19/2015 12:33   Echo 08/12/13 essentially unremarkable Study Conclusions - Left ventricle: The cavity size was normal. There was mild concentric hypertrophy. Systolic function was normal. The estimated ejection fraction was in the range of 60% to 65%. Wall motion was normal; there were no regional wall motion abnormalities. Left ventricular diastolic function parameters were normal. - Left atrium: The atrium was normal in size. - Right ventricle: The cavity size was mildly dilated. Wall thickness was normal. Systolic function was normal. Lateral annulus peak S velocity: 13.2cm/s. - Right atrium: The atrium was mildly dilated. - Atrial septum: No defect or patent foramen ovale was identified. - Tricuspid valve:  Poorly visualized. No significant regurgitation. Unable to calculate RVSP. - Inferior vena cava: The vessel was normal in size; the respirophasic diameter changes were in the normal range (= 50%); findings are consistent with normal central venous pressure. - Pericardium, extracardiac: There was no pericardial effusion  I have personally reviewed and evaluated these images and lab results as part of my medical decision-making.   EKG Interpretation None      MDM   Final diagnoses:  Chest wall pain  SOB (shortness of breath)  Asthma, unspecified asthma severity, uncomplicated  Hyperglycemia    50 y.o. female here with chest pain and shortness of breath 1 week, along with a dry cough, nasal congestion, and wheezing. She has been using her inhalers which helped some. She is no ongoing chest pain at this time, and the chest pain is reproducible on  exam. Likely musculoskeletal. No wheezing, no leg swelling, no tachycardia or hypoxia, PERC negative, doubt PE. Doubt ACS, pt had recent echo in 08/2013 which was unremarkable, follows up closely with cardiology. Doubt CHF. Patient states this feels like her asthma flareups. She reports that usually she starts prednisone and this improves her symptoms. She would like to try this. Troponin negative, EKG unchanged from prior, chest x-ray clear, and lab work unremarkable aside from mildly elevated glucose without changes and bicarbonate or anion gap. Will treat with prednisone, and some steroids. Follow-up with PCP in 3-5 days. Doubt need for antibiotics. I explained the diagnosis and have given explicit precautions to return to the ER including for any other new or worsening symptoms. The patient understands and accepts the medical plan as it's been dictated and I have answered their questions. Discharge instructions concerning home care and prescriptions have been given. The patient is STABLE and is discharged to home in good condition.   BP  133/88 mmHg  Pulse 84  Temp(Src) 97.8 F (36.6 C) (Oral)  Resp 16  SpO2 96%  Meds ordered this encounter  Medications  . predniSONE (DELTASONE) tablet 60 mg    Sig:   . predniSONE (DELTASONE) 20 MG tablet    Sig: 3 tabs po daily x 3 days beginning on 05/20/15    Dispense:  9 tablet    Refill:  0    Order Specific Question:  Supervising Provider    Answer:  Noemi Chapel [[id number]]     Ivionna Verley Camprubi-Soms, PA-C 05/19/15 1642  Dorie Rank, MD 05/19/15 [phone]

## 2015-05-19 NOTE — ED Notes (Signed)
Pt checked in 5 days ago and left prior to being seen.  Pt felt better.  She has had chest pain with shortness of breath x 1 month.  Getting worse in last week.  Inhaler gives some relief.  Congestion with no fever.  Dry cough. Chest pain does not radiate down either arm. Pt called MD and told to come here.

## 2015-05-19 NOTE — Discharge Instructions (Signed)
Continue to stay well-hydrated. Continue to alternate between Tylenol and Ibuprofen for pain or fever. Use Mucinex for cough suppression/expectoration of mucus. Use netipot and flonase to help with nasal congestion. May consider over-the-counter Benadryl or other antihistamine to decrease secretions and for watery itchy eyes. Use prednisone as directed for asthma symptoms. Use home inhaler as directed as needed for cough or shortness of breath. Followup with your primary care doctor in 5-7 days for recheck of ongoing symptoms. Return to emergency department for emergent changing or worsening of symptoms.   Asthma, Adult Asthma is a recurring condition in which the airways tighten and narrow. Asthma can make it difficult to breathe. It can cause coughing, wheezing, and shortness of breath. Asthma episodes, also called asthma attacks, range from minor to life-threatening. Asthma cannot be cured, but medicines and lifestyle changes can help control it. CAUSES Asthma is believed to be caused by inherited (genetic) and environmental factors, but its exact cause is unknown. Asthma may be triggered by allergens, lung infections, or irritants in the air. Asthma triggers are different for each person. Common triggers include:   Animal dander.  Dust mites.  Cockroaches.  Pollen from trees or grass.  Mold.  Smoke.  Air pollutants such as dust, household cleaners, hair sprays, aerosol sprays, paint fumes, strong chemicals, or strong odors.  Cold air, weather changes, and winds (which increase molds and pollens in the air).  Strong emotional expressions such as crying or laughing hard.  Stress.  Certain medicines (such as aspirin) or types of drugs (such as beta-blockers).  Sulfites in foods and drinks. Foods and drinks that may contain sulfites include dried fruit, potato chips, and sparkling grape juice.  Infections or inflammatory conditions such as the flu, a cold, or an inflammation of the  nasal membranes (rhinitis).  Gastroesophageal reflux disease (GERD).  Exercise or strenuous activity. SYMPTOMS Symptoms may occur immediately after asthma is triggered or many hours later. Symptoms include:  Wheezing.  Excessive nighttime or early morning coughing.  Frequent or severe coughing with a common cold.  Chest tightness.  Shortness of breath. DIAGNOSIS  The diagnosis of asthma is made by a review of your medical history and a physical exam. Tests may also be performed. These may include:  Lung function studies. These tests show how much air you breathe in and out.  Allergy tests.  Imaging tests such as X-rays. TREATMENT  Asthma cannot be cured, but it can usually be controlled. Treatment involves identifying and avoiding your asthma triggers. It also involves medicines. There are 2 classes of medicine used for asthma treatment:   Controller medicines. These prevent asthma symptoms from occurring. They are usually taken every day.  Reliever or rescue medicines. These quickly relieve asthma symptoms. They are used as needed and provide short-term relief. Your health care provider will help you create an asthma action plan. An asthma action plan is a written plan for managing and treating your asthma attacks. It includes a list of your asthma triggers and how they may be avoided. It also includes information on when medicines should be taken and when their dosage should be changed. An action plan may also involve the use of a device called a peak flow meter. A peak flow meter measures how well the lungs are working. It helps you monitor your condition. HOME CARE INSTRUCTIONS   Take medicines only as directed by your health care provider. Speak with your health care provider if you have questions about how or when to  take the medicines.  Use a peak flow meter as directed by your health care provider. Record and keep track of readings.  Understand and use the action plan to  help minimize or stop an asthma attack without needing to seek medical care.  Control your home environment in the following ways to help prevent asthma attacks:  Do not smoke. Avoid being exposed to secondhand smoke.  Change your heating and air conditioning filter regularly.  Limit your use of fireplaces and wood stoves.  Get rid of pests (such as roaches and mice) and their droppings.  Throw away plants if you see mold on them.  Clean your floors and dust regularly. Use unscented cleaning products.  Try to have someone else vacuum for you regularly. Stay out of rooms while they are being vacuumed and for a short while afterward. If you vacuum, use a dust mask from a hardware store, a double-layered or microfilter vacuum cleaner bag, or a vacuum cleaner with a HEPA filter.  Replace carpet with wood, tile, or vinyl flooring. Carpet can trap dander and dust.  Use allergy-proof pillows, mattress covers, and box spring covers.  Wash bed sheets and blankets every week in hot water and dry them in a dryer.  Use blankets that are made of polyester or cotton.  Clean bathrooms and kitchens with bleach. If possible, have someone repaint the walls in these rooms with mold-resistant paint. Keep out of the rooms that are being cleaned and painted.  Wash hands frequently. SEEK MEDICAL CARE IF:   You have wheezing, shortness of breath, or a cough even if taking medicine to prevent attacks.  The colored mucus you cough up (sputum) is thicker than usual.  Your sputum changes from clear or white to yellow, green, gray, or bloody.  You have any problems that may be related to the medicines you are taking (such as a rash, itching, swelling, or trouble breathing).  You are using a reliever medicine more than 2-3 times per week.  Your peak flow is still at 50-79% of your personal best after following your action plan for 1 hour.  You have a fever. SEEK IMMEDIATE MEDICAL CARE IF:   You seem  to be getting worse and are unresponsive to treatment during an asthma attack.  You are short of breath even at rest.  You get short of breath when doing very little physical activity.  You have difficulty eating, drinking, or talking due to asthma symptoms.  You develop chest pain.  You develop a fast heartbeat.  You have a bluish color to your lips or fingernails.  You are light-headed, dizzy, or faint.  Your peak flow is less than 50% of your personal best.   This information is not intended to replace advice given to you by your health care provider. Make sure you discuss any questions you have with your health care provider.   Document Released: 04/25/2005 Document Revised: 01/14/2015 Document Reviewed: 11/22/2012 Elsevier Interactive Patient Education 2016 [employer]  Chest Wall Pain Chest wall pain is pain in or around the bones and muscles of your chest. Sometimes, an injury causes this pain. Sometimes, the cause may not be known. This pain may take several weeks or longer to get better. HOME CARE Pay attention to any changes in your symptoms. Take these actions to help with your pain:  Rest as told by your doctor.  Avoid activities that cause pain. Try not to use your chest, belly (abdominal), or side muscles to  lift heavy things.  If directed, apply ice to the painful area:  Put ice in a plastic bag.  Place a towel between your skin and the bag.  Leave the ice on for 20 minutes, 2-3 times per day.  Take over-the-counter and prescription medicines only as told by your doctor.  Do not use tobacco products, including cigarettes, chewing tobacco, and e-cigarettes. If you need help quitting, ask your doctor.  Keep all follow-up visits as told by your doctor. This is important. GET HELP IF:  You have a fever.  Your chest pain gets worse.  You have new symptoms. GET HELP RIGHT AWAY IF:  You feel sick to your stomach (nauseous) or you throw up  (vomit).  You feel sweaty or light-headed.  You have a cough with phlegm (sputum) or you cough up blood.  You are short of breath.   This information is not intended to replace advice given to you by your health care provider. Make sure you discuss any questions you have with your health care provider.   Document Released: 10/12/2007 Document Revised: 01/14/2015 Document Reviewed: 07/21/2014 Elsevier Interactive Patient Education 2016 [employer]  Cough, Adult A cough helps to clear your throat and lungs. A cough may last only 2-3 weeks (acute), or it may last longer than 8 weeks (chronic). Many different things can cause a cough. A cough may be a sign of an illness or another medical condition. HOME CARE  Pay attention to any changes in your cough.  Take medicines only as told by your doctor.  If you were prescribed an antibiotic medicine, take it as told by your doctor. Do not stop taking it even if you start to feel better.  Talk with your doctor before you try using a cough medicine.  Drink enough fluid to keep your pee (urine) clear or pale yellow.  If the air is dry, use a cold steam vaporizer or humidifier in your home.  Stay away from things that make you cough at work or at home.  If your cough is worse at night, try using extra pillows to raise your head up higher while you sleep.  Do not smoke, and try not to be around smoke. If you need help quitting, ask your doctor.  Do not have caffeine.  Do not drink alcohol.  Rest as needed. GET HELP IF:  You have new problems (symptoms).  You cough up yellow fluid (pus).  Your cough does not get better after 2-3 weeks, or your cough gets worse.  Medicine does not help your cough and you are not sleeping well.  You have pain that gets worse or pain that is not helped with medicine.  You have a fever.  You are losing weight and you do not know why.  You have night sweats. GET HELP RIGHT AWAY IF:  You cough  up blood.  You have trouble breathing.  Your heartbeat is very fast.   This information is not intended to replace advice given to you by your health care provider. Make sure you discuss any questions you have with your health care provider.   Document Released: 01/06/2011 Document Revised: 01/14/2015 Document Reviewed: 07/02/2014 Elsevier Interactive Patient Education 2016 [employer].  Shortness of Breath Shortness of breath means you have trouble breathing. It could also mean that you have a medical problem. You should get immediate medical care for shortness of breath. CAUSES   Not enough oxygen in the air such as with high altitudes  or a smoke-filled room.  Certain lung diseases, infections, or problems.  Heart disease or conditions, such as angina or heart failure.  Low red blood cells (anemia).  Poor physical fitness, which can cause shortness of breath when you exercise.  Chest or back injuries or stiffness.  Being overweight.  Smoking.  Anxiety, which can make you feel like you are not getting enough air. DIAGNOSIS  Serious medical problems can often be found during your physical exam. Tests may also be done to determine why you are having shortness of breath. Tests may include:  Chest X-rays.  Lung function tests.  Blood tests.  An electrocardiogram (ECG).  An ambulatory electrocardiogram. An ambulatory ECG records your heartbeat patterns over a 24-hour period.  Exercise testing.  A transthoracic echocardiogram (TTE). During echocardiography, sound waves are used to evaluate how blood flows through your heart.  A transesophageal echocardiogram (TEE).  Imaging scans. Your health care provider may not be able to find a cause for your shortness of breath after your exam. In this case, it is important to have a follow-up exam with your health care provider as directed.  TREATMENT  Treatment for shortness of breath depends on the cause of your symptoms and  can vary greatly. HOME CARE INSTRUCTIONS   Do not smoke. Smoking is a common cause of shortness of breath. If you smoke, ask for help to quit.  Avoid being around chemicals or things that may bother your breathing, such as paint fumes and dust.  Rest as needed. Slowly resume your usual activities.  If medicines were prescribed, take them as directed for the full length of time directed. This includes oxygen and any inhaled medicines.  Keep all follow-up appointments as directed by your health care provider. SEEK MEDICAL CARE IF:   Your condition does not improve in the time expected.  You have a hard time doing your normal activities even with rest.  You have any new symptoms. SEEK IMMEDIATE MEDICAL CARE IF:   Your shortness of breath gets worse.  You feel light-headed, faint, or develop a cough not controlled with medicines.  You start coughing up blood.  You have pain with breathing.  You have chest pain or pain in your arms, shoulders, or abdomen.  You have a fever.  You are unable to walk up stairs or exercise the way you normally do. MAKE SURE YOU:  Understand these instructions.  Will watch your condition.  Will get help right away if you are not doing well or get worse.   This information is not intended to replace advice given to you by your health care provider. Make sure you discuss any questions you have with your health care provider.   Document Released: 01/18/2001 Document Revised: 04/30/2013 Document Reviewed: 07/11/2011 Elsevier Interactive Patient Education [employer].

## 2015-08-08 ENCOUNTER — Other Ambulatory Visit: Payer: Self-pay | Admitting: Cardiovascular Disease

## 2015-08-31 ENCOUNTER — Other Ambulatory Visit: Payer: Self-pay | Admitting: Cardiovascular Disease

## 2015-09-01 ENCOUNTER — Other Ambulatory Visit: Payer: Self-pay

## 2015-09-01 MED ORDER — CARVEDILOL 12.5 MG PO TABS: 12.5000 mg | ORAL_TABLET | Freq: Two times a day (BID) | ORAL | Status: DC

## 2015-09-03 ENCOUNTER — Encounter: Payer: Self-pay | Admitting: Cardiovascular Disease

## 2015-09-28 ENCOUNTER — Encounter: Payer: Self-pay | Admitting: Cardiovascular Disease

## 2015-10-21 ENCOUNTER — Ambulatory Visit: Payer: BLUE CROSS/BLUE SHIELD | Admitting: Cardiovascular Disease

## 2015-10-25 NOTE — Progress Notes (Signed)
Patient ID: Veronica Rivers, female   DOB: 1966/04/28, 50 y.o.   MRN: [id number]   Veronica Rivers is seen today for f.u of HTN, elevated lipids atypicdal chest pain history of SVT. She has been doing well. Some vetigo on antivert . She eats too many carbs and we discussed her diet and weight loss at length. She had an normal echo in 2009. She is not having recurrent palpitations or SSCP. She has been compliant with her meds. She had an LDL of 64 at her [facility] recently with normal LFT's and TSH  Seen 4/15 with dyspnea  CXR 08/07/13 NAD   Echo 08/12/13 essentially unremarkable  Study Conclusions  - Left ventricle: The cavity size was normal. There was mild concentric hypertrophy. Systolic function was normal. The estimated ejection fraction was in the range of 60% to 65%. Wall motion was normal; there were no regional wall motion abnormalities. Left ventricular diastolic function parameters were normal. - Left atrium: The atrium was normal in size. - Right ventricle: The cavity size was mildly dilated. Wall thickness was normal. Systolic function was normal. Lateral annulus peak S velocity: 13.2cm/s. - Right atrium: The atrium was mildly dilated. - Atrial septum: No defect or patent foramen ovale was identified. - Tricuspid valve: Poorly visualized. No significant regurgitation. Unable to calculate RVSP. - Inferior vena cava: The vessel was normal in size; the respirophasic diameter changes were in the normal range (= 50%); findings are consistent with normal central venous pressure. 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  All other systems reviewed and negative  General: Affect appropriate Obese white female  HEENT: normal Neck supple with no adenopathy JVP normal no bruits no thyromegaly Lungs clear with no wheezing and good diaphragmatic motion Heart:  S1/S2 no murmur, no rub, gallop or click PMI normal Abdomen: benighn, BS positve, no tenderness, no AAA no bruit.  No HSM or HJR Distal pulses intact with no bruits No edema Neuro non-focal Skin warm and dry scar left knee from fall  No muscular weakness   Current Outpatient Prescriptions  Medication Sig Dispense Refill  . albuterol (PROVENTIL HFA;VENTOLIN HFA) 108 (90 BASE) MCG/ACT inhaler Inhale 1-2 puffs into the lungs every 4 (four) hours as needed for wheezing or shortness of breath (wheezing and shortness of breath).     . Azelastine-Fluticasone (DYMISTA NA) Place 50 mcg into the nose daily.    [name] BREO ELLIPTA 200-25 MCG/INH AEPB Inhale 1 puff into the lungs daily.  1  . carvedilol (COREG) 12.5 MG tablet Take 1 tablet (12.5 mg total) by mouth 2 (two) times daily with a meal. 180 tablet 0  . gabapentin (NEURONTIN) 300 MG capsule Take 1 capsule by mouth 3 (three) times daily.  0  . LamoTRIgine 300 MG TB24 Take 300 mg by mouth daily.    [name] levocetirizine (XYZAL) 5 MG tablet Take 5 mg by mouth every evening.    [name] levothyroxine (SYNTHROID, LEVOTHROID) 100 MCG tablet Take 100 mcg by mouth at bedtime.     [name]  metFORMIN (GLUCOPHAGE) 1000 MG tablet Take 1,000 mg by mouth daily with breakfast.    . montelukast (SINGULAIR) 10 MG tablet Take 10 mg by mouth at bedtime.    [name] oxyCODONE-acetaminophen (PERCOCET/ROXICET) 5-325 MG per tablet Take 1 tablet by mouth every 6 (six) hours as needed for moderate pain (pain).     . QUEtiapine (SEROQUEL XR) 400 MG 24 hr tablet Take 400 mg  by mouth at bedtime.    . rosuvastatin (CRESTOR) 10 MG tablet TAKE 1 TABLET BY MOUTH 2 TIMES A WEEK 15 tablet 6   No current facility-administered medications for this visit.    Allergies  Zocor and Sulfa drugs cross reactors  Electrocardiogram:  08/07/13  SR rate 98 poor R wave progression nonspecific ST/T wave changes  10/27/14  SR rate 78 low voltage nonspecific ST changes no change from 2015   Assessment and Plan HTN:  Well controlled.  Continue current medications and low sodium Dash type diet.   DM:  Discussed low carb diet.  Target hemoglobin A1c is 6.5 or less.  Continue current medications. Chol:   Cholesterol is at goal.  Continue current dose of statin and diet Rx.  No myalgias or side effects.  F/U  LFT's in 6 months. Lab Results  Component Value Date   LDLCALC 70 07/02/2014            SVT:  Quiescent infrequent palpitaitons continue beta blocker Can take extra coreg as needed  Chest Pain:  Resolved previously normal ETT  Observe Anxiety:  On abilify seems stable  F/U 1 year  [name]

## 2015-10-26 ENCOUNTER — Telehealth: Payer: Self-pay | Admitting: *Deleted

## 2015-10-26 ENCOUNTER — Encounter: Payer: Self-pay | Admitting: Cardiovascular Disease

## 2015-10-26 ENCOUNTER — Ambulatory Visit (INDEPENDENT_AMBULATORY_CARE_PROVIDER_SITE_OTHER): Payer: 59 | Admitting: Cardiovascular Disease

## 2015-10-26 VITALS — BP 110/80 | HR 78 | Ht 69.5 in | Wt 258.8 lb

## 2015-10-26 DIAGNOSIS — I10 Essential (primary) hypertension: Secondary | ICD-10-CM

## 2015-10-26 MED ORDER — ROSUVASTATIN CALCIUM 10 MG PO TABS: 10.0000 mg | ORAL_TABLET | ORAL | Status: DC

## 2015-10-26 NOTE — Addendum Note (Signed)
Addended by: Emmaline Life on: 10/26/2015 08:20 AM   Modules accepted: Orders

## 2015-10-26 NOTE — Telephone Encounter (Signed)
Rx sent for Crestor 10 mg twice weekly per Dr. Johnsie Cancel

## 2015-10-26 NOTE — Patient Instructions (Signed)
Medication Instructions:  Your physician recommends that you continue on your current medications as directed. Please refer to the Current Medication list given to you today.   Labwork: None Ordered   Testing/Procedures: None Ordered   Follow-Up: Your physician wants you to follow-up in: 1 year with Dr. Nishan.  You will receive a reminder letter in the mail two months in advance. If you don't receive a letter, please call our office to schedule the follow-up appointment.   If you need a refill on your cardiac medications before your next appointment, please call your pharmacy.   Thank you for choosing [facility]! Burnetta Kohls, RN [phone]    

## 2015-10-26 NOTE — Telephone Encounter (Signed)
Pharmacist from [facility] called and requested that the rx for rosuvastatin be resent with a frequency. Thanks, MI

## 2015-11-24 ENCOUNTER — Emergency Department (HOSPITAL_BASED_OUTPATIENT_CLINIC_OR_DEPARTMENT_OTHER): Admit: 2015-11-24 | Discharge: 2015-11-24 | Disposition: A | Discharge status: Home / Self Care | Payer: 59

## 2015-11-24 ENCOUNTER — Encounter (HOSPITAL_COMMUNITY): Payer: Self-pay | Admitting: Emergency Medicine

## 2015-11-24 ENCOUNTER — Emergency Department (HOSPITAL_COMMUNITY): Payer: 59

## 2015-11-24 ENCOUNTER — Emergency Department (HOSPITAL_COMMUNITY)
Admission: EM | Admit: 2015-11-24 | Discharge: 2015-11-24 | Disposition: A | Discharge status: Home / Self Care | Payer: 59 | Attending: Emergency Medicine | Admitting: Emergency Medicine

## 2015-11-24 DIAGNOSIS — E119 Type 2 diabetes mellitus without complications: Secondary | ICD-10-CM | POA: Insufficient documentation

## 2015-11-24 DIAGNOSIS — Z87891 Personal history of nicotine dependence: Secondary | ICD-10-CM | POA: Diagnosis not present

## 2015-11-24 DIAGNOSIS — E039 Hypothyroidism, unspecified: Secondary | ICD-10-CM | POA: Insufficient documentation

## 2015-11-24 DIAGNOSIS — M25572 Pain in left ankle and joints of left foot: Secondary | ICD-10-CM | POA: Diagnosis present

## 2015-11-24 DIAGNOSIS — F319 Bipolar disorder, unspecified: Secondary | ICD-10-CM | POA: Insufficient documentation

## 2015-11-24 DIAGNOSIS — M7989 Other specified soft tissue disorders: Secondary | ICD-10-CM | POA: Diagnosis not present

## 2015-11-24 DIAGNOSIS — J45909 Unspecified asthma, uncomplicated: Secondary | ICD-10-CM | POA: Insufficient documentation

## 2015-11-24 DIAGNOSIS — L03116 Cellulitis of left lower limb: Secondary | ICD-10-CM

## 2015-11-24 DIAGNOSIS — E785 Hyperlipidemia, unspecified: Secondary | ICD-10-CM | POA: Diagnosis not present

## 2015-11-24 DIAGNOSIS — Z79899 Other long term (current) drug therapy: Secondary | ICD-10-CM | POA: Diagnosis not present

## 2015-11-24 DIAGNOSIS — M79609 Pain in unspecified limb: Secondary | ICD-10-CM | POA: Diagnosis not present

## 2015-11-24 MED ORDER — AMOXICILLIN 500 MG PO CAPS: 500.0000 mg | ORAL_CAPSULE | Freq: Two times a day (BID) | ORAL | Status: DC

## 2015-11-24 MED ORDER — HYDROCODONE-ACETAMINOPHEN 5-325 MG PO TABS
1.0000 | ORAL_TABLET | Freq: Once | ORAL | Status: AC
  Administered 2015-11-24: 1 via ORAL
  Filled 2015-11-24: qty 1

## 2015-11-24 MED ORDER — DOXYCYCLINE HYCLATE 100 MG PO CAPS
100.0000 mg | ORAL_CAPSULE | Freq: Two times a day (BID) | ORAL | Status: DC
Start: 2015-11-24 — End: 2016-06-16

## 2015-11-24 NOTE — Discharge Instructions (Signed)
It was my pleasure taking care of you today!  Please take all of your antibiotics until finished! Return to ER in 2 days for wound check. Return sooner if you develop fever, have worsening symptoms, redness spreads outside the lines we marked today, new symptoms develop , or any additional concerns.

## 2015-11-24 NOTE — ED Notes (Signed)
Patient presents for ankle injury x1 week. Reports history of cellulitis and is concerned about same today. Swelling and redness noted to same. Denies fever.

## 2015-11-24 NOTE — Progress Notes (Signed)
VASCULAR LAB PRELIMINARY  PRELIMINARY  PRELIMINARY  PRELIMINARY  Left lower extremity venous duplex has been completed.      Left:  No evidence of DVT, superficial thrombosis, or Baker's cyst.  Gave results to nurse44:30 pm.  Veronica Rivers, RVT, RDMS 11/24/2015, 4:33 PM

## 2015-11-24 NOTE — ED Provider Notes (Signed)
CSN: [id number]     Arrival date & time 11/24/15  1445 History  By signing my name below, I, Eustaquio Maize, attest that this documentation has been prepared under the direction and in the presence of [facility], PA-C.  Electronically Signed: Eustaquio Maize, ED Scribe. 11/24/2015. 3:17 PM.   Chief Complaint  Patient presents with  . Ankle Pain   The history is provided by the patient. No language interpreter was used.    HPI Comments: Veronica Rivers is a 50 y.o. female with PMHx DM, HLD, hypothyroidism who presents to the Emergency Department complaining of gradual onset, constant, left ankle pain x 1 week. Pt states that [place] into her. She reports she must have rolled her ankle in the process but is uncertain. She also complains of swelling which appeared a couple of days ago. Pt states that she began having redness to the foot that began this morning. Pt has hx of cellulitis to the left foot in the past after injuring left foot. Pt was admitted to the hospital at that time for IV antibiotics. She has been taking Naproxen and elevating/icing the ankle with mild relief. Pt is not on any birth control or hormone replacement, no recent surgeries. She did [place]. Denies fever, chills, shortness of breath, chest pain or any other associated symptoms.    Past Medical History  Diagnosis Date  . HYPOTHYROIDISM   . DM   . HYPERCHOLESTEROLEMIA   . HYPERLIPIDEMIA   . SUPRAVENTRICULAR TACHYCARDIA   . DYSPNEA   . CHEST PAIN   . Cellulitis   . Bipolar depression ([place])   . Neuropathy ([place])   . Asthma    Past Surgical History  Procedure Laterality Date  . Cesarean section    . Tubal ligation    . Neck surgery    . Foot surgery    . Tonsillectomy    . Nasal septum surgery     Family History  Problem Relation Age of Onset  . Hypertension Mother   . Cancer Father     Lung, died in his 5s  . COPD Mother     Died  age 31   Social History  Substance Use Topics  . Smoking status: Former Smoker -- 15 years    Types: Cigarettes    Quit date: 11/23/1997  . Smokeless tobacco: Never Used  . Alcohol Use: No   OB History    No data available     Review of Systems  Constitutional: Negative for fever and chills.  HENT: Negative for congestion.   Eyes: Negative for visual disturbance.  Respiratory: Negative for cough and shortness of breath.   Cardiovascular: Positive for leg swelling. Negative for chest pain and palpitations.  Gastrointestinal: Negative for nausea, vomiting and abdominal pain.  Genitourinary: Negative for dysuria.  Musculoskeletal: Positive for joint swelling and arthralgias.  Skin: Positive for color change. Negative for wound.  Neurological: Negative for numbness.   Allergies  Zocor and Sulfa drugs cross reactors  Home Medications   Prior to Admission medications   Medication Sig Start Date End Date Taking? Authorizing Provider  albuterol (PROVENTIL HFA;VENTOLIN HFA) 108 (90 BASE) MCG/ACT inhaler Inhale 1-2 puffs into the lungs every 4 (four) hours as needed for wheezing or shortness of breath (wheezing and shortness of breath).     Historical Provider, MD  amoxicillin (AMOXIL) 500 MG capsule Take 1 capsule (500  mg total) by mouth 2 (two) times daily. 11/24/15   Ozella Almond Bryen Hinderman, PA-C  Azelastine-Fluticasone (DYMISTA NA) Place 50 mcg into the nose daily.    Historical Provider, MD  BREO ELLIPTA 200-25 MCG/INH AEPB Inhale 1 puff into the lungs daily. 09/24/15   Historical Provider, MD  carvedilol (COREG) 12.5 MG tablet Take 1 tablet (12.5 mg total) by mouth 2 (two) times daily with a meal. 09/01/15   Josue Hector, MD  doxycycline (VIBRAMYCIN) 100 MG capsule Take 1 capsule (100 mg total) by mouth 2 (two) times daily. 11/24/15   Ozella Almond Vanderbilt Ranieri, PA-C  gabapentin (NEURONTIN) 300 MG capsule Take 1 capsule by mouth 3 (three) times daily. 04/21/14   Historical Provider, MD   LamoTRIgine 300 MG TB24 Take 300 mg by mouth daily.    Historical Provider, MD  levocetirizine (XYZAL) 5 MG tablet Take 5 mg by mouth every evening.    Historical Provider, MD  levothyroxine (SYNTHROID, LEVOTHROID) 100 MCG tablet Take 100 mcg by mouth at bedtime.     Historical Provider, MD  metFORMIN (GLUCOPHAGE) 1000 MG tablet Take 1,000 mg by mouth daily with breakfast.    Historical Provider, MD  montelukast (SINGULAIR) 10 MG tablet Take 10 mg by mouth at bedtime.    Historical Provider, MD  oxyCODONE-acetaminophen (PERCOCET/ROXICET) 5-325 MG per tablet Take 1 tablet by mouth every 6 (six) hours as needed for moderate pain (pain).  11/27/12   Venetia Maxon Rama, MD  QUEtiapine (SEROQUEL XR) 400 MG 24 hr tablet Take 400 mg by mouth at bedtime.    Historical Provider, MD  rosuvastatin (CRESTOR) 10 MG tablet Take 1 tablet (10 mg total) by mouth 2 (two) times a week. 10/26/15   Josue Hector, MD   BP 149/87 mmHg  Pulse 70  Temp(Src) 97.7 F (36.5 C) (Oral)  Resp 16  Ht 5\' 9"  (1.753 m)  Wt 119.296 kg  BMI 38.82 kg/m2  SpO2 95%  LMP  (LMP Unknown)   Physical Exam  Constitutional: She is oriented to person, place, and time. She appears well-developed and well-nourished. No distress.  HENT:  Head: Normocephalic and atraumatic.  Neck: Neck supple. No tracheal deviation present.  Cardiovascular: Normal rate, regular rhythm and normal heart sounds.   Pulmonary/Chest: Effort normal and breath sounds normal. No respiratory distress. She has no wheezes. She has no rales.  Musculoskeletal: Normal range of motion.       Feet:  Left foot/ankle: TTP as depicted in image. Patient also has some tenderness to palpation of the left calf. + swelling. Full ROM. Erythema over the top of the foot. 2+ DP pulses, sensation intact to medial, lateral, dorsal and plantar aspects.  Neurological: She is alert and oriented to person, place, and time.  Skin: Skin is warm and dry.  Psychiatric: She has a normal mood  and affect. Her behavior is normal.  Nursing note and vitals reviewed.   ED Course  Procedures (including critical care time)  DIAGNOSTIC STUDIES: Oxygen Saturation is 93% on RA, adequate by my interpretation.    COORDINATION OF CARE: 3:17 PM-Discussed treatment plan which includes DG L Ankle with pt at bedside and pt agreed to plan.   Labs Review Labs Reviewed - No data to display  Imaging Review Dg Ankle Complete Left  11/24/2015  CLINICAL DATA:  50 year old female with injury left ankle 1 week ago. Initial encounter. EXAM: LEFT ANKLE COMPLETE - 3+ VIEW COMPARISON:  11/23/2012 plain film exam. FINDINGS: No fracture or dislocation.  Soft tissue prominence. Plantar spur. IMPRESSION: No fracture or dislocation. Soft tissue prominence. Plantar spur. Electronically Signed   By: Genia Del M.D.   On: 11/24/2015 15:20   I have personally reviewed and evaluated these images and lab results as part of my medical decision-making.   EKG Interpretation None      MDM   Final diagnoses:  Cellulitis of left lower extremity   Veronica Rivers presents to ED for worsening pain of the top of her foot after injury 1 week ago. Became red this morning. On exam, LLE is neurovascularly intact. + swelling and erythema over top of foot, along with some mild calf tenderness. No streaking and erythema is isolated to the dorsum of the foot. X-rays and DVT ultrasound obtained which were reassuring. Allergic to sulfa - Will treat cellulitis with Doxy and amoxil. Given patient's hx of DM and hx of cellulitis requiring IV ABX, a significant amount of time was taken to discuss reasons to return to ED. I also recommended she return for wound recheck in 2 days, sooner if symptoms worsen. Area of erythema marked with skin marker. Patient expressed verbal understanding of return precautions and follow-up care recommendations and is in agreement with plan. All questions answered.  I personally performed the services  described in this documentation, which was scribed in my presence. The recorded information has been reviewed and is accurate.     [facility] Starling Jessie, PA-C 11/24/15 1730  Lajean Saver, MD 11/24/15 [phone]

## 2015-11-26 ENCOUNTER — Emergency Department (HOSPITAL_COMMUNITY)
Admission: EM | Admit: 2015-11-26 | Discharge: 2015-11-26 | Disposition: A | Discharge status: Home / Self Care | Payer: 59 | Attending: Emergency Medicine | Admitting: Emergency Medicine

## 2015-11-26 ENCOUNTER — Encounter (HOSPITAL_COMMUNITY): Payer: Self-pay | Admitting: *Deleted

## 2015-11-26 DIAGNOSIS — Z7984 Long term (current) use of oral hypoglycemic drugs: Secondary | ICD-10-CM | POA: Insufficient documentation

## 2015-11-26 DIAGNOSIS — Z79899 Other long term (current) drug therapy: Secondary | ICD-10-CM | POA: Insufficient documentation

## 2015-11-26 DIAGNOSIS — J45909 Unspecified asthma, uncomplicated: Secondary | ICD-10-CM | POA: Diagnosis not present

## 2015-11-26 DIAGNOSIS — E119 Type 2 diabetes mellitus without complications: Secondary | ICD-10-CM | POA: Diagnosis not present

## 2015-11-26 DIAGNOSIS — F3130 Bipolar disorder, current episode depressed, mild or moderate severity, unspecified: Secondary | ICD-10-CM | POA: Diagnosis not present

## 2015-11-26 DIAGNOSIS — E7800 Pure hypercholesterolemia, unspecified: Secondary | ICD-10-CM | POA: Insufficient documentation

## 2015-11-26 DIAGNOSIS — Z792 Long term (current) use of antibiotics: Secondary | ICD-10-CM | POA: Insufficient documentation

## 2015-11-26 DIAGNOSIS — Z87891 Personal history of nicotine dependence: Secondary | ICD-10-CM | POA: Diagnosis not present

## 2015-11-26 DIAGNOSIS — L03119 Cellulitis of unspecified part of limb: Secondary | ICD-10-CM

## 2015-11-26 DIAGNOSIS — L03116 Cellulitis of left lower limb: Secondary | ICD-10-CM | POA: Diagnosis present

## 2015-11-26 DIAGNOSIS — L02612 Cutaneous abscess of left foot: Secondary | ICD-10-CM | POA: Insufficient documentation

## 2015-11-26 DIAGNOSIS — Z7951 Long term (current) use of inhaled steroids: Secondary | ICD-10-CM | POA: Insufficient documentation

## 2015-11-26 DIAGNOSIS — E785 Hyperlipidemia, unspecified: Secondary | ICD-10-CM | POA: Diagnosis not present

## 2015-11-26 DIAGNOSIS — L02419 Cutaneous abscess of limb, unspecified: Secondary | ICD-10-CM

## 2015-11-26 DIAGNOSIS — E039 Hypothyroidism, unspecified: Secondary | ICD-10-CM | POA: Diagnosis not present

## 2015-11-26 LAB — CBC WITH DIFFERENTIAL/PLATELET
Basophils Absolute: 0 10*3/uL (ref 0.0–0.1)
Basophils Relative: 0 %
EOS PCT: 2 %
Eosinophils Absolute: 0.1 10*3/uL (ref 0.0–0.7)
HCT: 43.7 % (ref 36.0–46.0)
Hemoglobin: 14.2 g/dL (ref 12.0–15.0)
LYMPHS ABS: 2.4 10*3/uL (ref 0.7–4.0)
LYMPHS PCT: 33 %
MCH: 31.4 pg (ref 26.0–34.0)
MCHC: 32.5 g/dL (ref 30.0–36.0)
MCV: 96.7 fL (ref 78.0–100.0)
MONO ABS: 0.6 10*3/uL (ref 0.1–1.0)
Monocytes Relative: 9 %
Neutro Abs: 4 10*3/uL (ref 1.7–7.7)
Neutrophils Relative %: 56 %
PLATELETS: 212 10*3/uL (ref 150–400)
RBC: 4.52 MIL/uL (ref 3.87–5.11)
RDW: 12.6 % (ref 11.5–15.5)
WBC: 7.2 10*3/uL (ref 4.0–10.5)

## 2015-11-26 LAB — BASIC METABOLIC PANEL
Anion gap: 8 (ref 5–15)
BUN: 18 mg/dL (ref 6–20)
CHLORIDE: 99 mmol/L — AB (ref 101–111)
CO2: 28 mmol/L (ref 22–32)
Calcium: 9.6 mg/dL (ref 8.9–10.3)
Creatinine, Ser: 0.91 mg/dL (ref 0.44–1.00)
GFR calc Af Amer: >60 mL/min (ref >60)
GLUCOSE: 341 mg/dL — AB (ref 65–99)
POTASSIUM: 4.2 mmol/L (ref 3.5–5.1)
Sodium: 135 mmol/L (ref 135–145)

## 2015-11-26 MED ORDER — BACITRACIN ZINC 500 UNIT/GM EX OINT
TOPICAL_OINTMENT | Freq: Two times a day (BID) | CUTANEOUS | Status: DC
  Administered 2015-11-26: 1 via TOPICAL
  Filled 2015-11-26: qty 1.8

## 2015-11-26 MED ORDER — VANCOMYCIN HCL 10 G IV SOLR
2000.0000 mg | INTRAVENOUS | Status: AC
  Administered 2015-11-26: 2000 mg via INTRAVENOUS
  Filled 2015-11-26: qty 2000

## 2015-11-26 MED ORDER — LIDOCAINE-EPINEPHRINE 2 %-1:100000 IJ SOLN
INTRAMUSCULAR | Status: AC
  Administered 2015-11-26: 1 mL
  Filled 2015-11-26: qty 1

## 2015-11-26 MED ORDER — LIDOCAINE-EPINEPHRINE (PF) 1 %-1:200000 IJ SOLN: 20.0000 mL | Freq: Once | INTRAMUSCULAR | Status: DC

## 2015-11-26 MED ORDER — LIDOCAINE-EPINEPHRINE (PF) 1 %-1:200000 IJ SOLN: 10.0000 mL | Freq: Once | INTRAMUSCULAR | Status: DC

## 2015-11-26 MED ORDER — VANCOMYCIN HCL IN DEXTROSE 750-5 MG/150ML-% IV SOLN: 750.0000 mg | Freq: Two times a day (BID) | INTRAVENOUS | Status: DC

## 2015-11-26 MED ORDER — VANCOMYCIN HCL 10 G IV SOLR
2000.0000 mg | INTRAVENOUS | Status: DC
  Filled 2015-11-26: qty 2000

## 2015-11-26 MED ORDER — LIDOCAINE-EPINEPHRINE (PF) 2 %-1:200000 IJ SOLN
10.0000 mL | Freq: Once | INTRAMUSCULAR | Status: DC
  Filled 2015-11-26: qty 10

## 2015-11-26 MED ORDER — ONDANSETRON HCL 4 MG/2ML IJ SOLN
4.0000 mg | Freq: Once | INTRAMUSCULAR | Status: AC
  Administered 2015-11-26: 4 mg via INTRAVENOUS
  Filled 2015-11-26: qty 2

## 2015-11-26 MED ORDER — MORPHINE SULFATE (PF) 4 MG/ML IV SOLN
4.0000 mg | Freq: Once | INTRAVENOUS | Status: AC
  Administered 2015-11-26: 4 mg via INTRAVENOUS
  Filled 2015-11-26: qty 1

## 2015-11-26 MED ORDER — LIDOCAINE-EPINEPHRINE 2 %-1:100000 IJ SOLN
20.0000 mL | Freq: Once | INTRAMUSCULAR | Status: AC
  Administered 2015-11-26: 1 mL

## 2015-11-26 NOTE — Discharge Instructions (Signed)

## 2015-11-26 NOTE — Progress Notes (Signed)
Pharmacy: Vancomycin for cellulitis  Assessment: Vancomycin loading dose of 2gm IV x1 given at 1450 and awaiting for Scr to be resulted for maintenance dose. Scr=0.91 with CrCL normalized= 84 ml/min  Plan:   Will start maintenance dose of  Vancomycin 750mg  IV Q12h for trough goal of 10-15 mcg/ml.   Will f/u Scr.   Will check Vanco trough level at steady state.  Thanks, Garnet Sierras, PharmD 11/26/2015

## 2015-11-26 NOTE — ED Notes (Signed)
Pt was seen here 2 days ago for left foot cellulitis. She comes back today, stating "infection looks worse". Reports pain as 6/10, taking naproxen for pain and antibiotics.

## 2015-11-26 NOTE — ED Notes (Signed)
MD at bedside. 

## 2015-11-26 NOTE — ED Notes (Addendum)
P/t was seen in [facility] ED x2 days ago for Cellulitis on the Left foot. P/t was sent home with PO antibiotics and has taken them as prescribed. P/t comes in today with report of new onset heat on affected extremity,and  a  reddened, raised area on foot which p/t states has increased in size. Heat and raised area noted upon assessment.Patient states pain of 6/10, reports having took 5mg  percocet/325 acetaminophen around 10:30am.

## 2015-11-26 NOTE — ED Notes (Signed)
RN starting IV, drawing labs 

## 2015-11-26 NOTE — ED Provider Notes (Signed)
CSN: [id number]     Arrival date & time 11/26/15  1303 History   First MD Initiated Contact with Patient 11/26/15 1335     Chief Complaint  Patient presents with  . Cellulitis  Pt is a 50 yo wf with a hx of cellulitis.  She was here 2 days ago for the same and was d/c'd home on doxycycline and amox.  Pt said the redness is worse.  She said that she has had this in the past and required IV abx.  Pt said that a bump is forming on her left foot.   (Consider location/radiation/quality/duration/timing/severity/associated sxs/prior Treatment) The history is provided by the patient.    Past Medical History  Diagnosis Date  . HYPOTHYROIDISM   . DM   . HYPERCHOLESTEROLEMIA   . HYPERLIPIDEMIA   . SUPRAVENTRICULAR TACHYCARDIA   . DYSPNEA   . CHEST PAIN   . Cellulitis   . Bipolar depression ([place])   . Neuropathy ([place])   . Asthma    Past Surgical History  Procedure Laterality Date  . Cesarean section    . Tubal ligation    . Neck surgery    . Foot surgery    . Tonsillectomy    . Nasal septum surgery     Family History  Problem Relation Age of Onset  . Hypertension Mother   . Cancer Father     Lung, died in his 55s  . COPD Mother     Died age 91   Social History  Substance Use Topics  . Smoking status: Former Smoker -- 15 years    Types: Cigarettes    Quit date: 11/23/1997  . Smokeless tobacco: Never Used  . Alcohol Use: No   OB History    No data available     Review of Systems  Skin: Positive for rash and wound.  All other systems reviewed and are negative.     Allergies  Zocor and Sulfa drugs cross reactors  Home Medications   Prior to Admission medications   Medication Sig Start Date End Date Taking? Authorizing Provider  albuterol (PROVENTIL HFA;VENTOLIN HFA) 108 (90 BASE) MCG/ACT inhaler Inhale 1-2 puffs into the lungs every 4 (four) hours as needed for wheezing or shortness of breath.    Yes Historical Provider, MD  amoxicillin (AMOXIL) 500 MG capsule  Take 1 capsule (500 mg total) by mouth 2 (two) times daily. Patient taking differently: Take 500 mg by mouth 2 (two) times daily. Started 07/18 for 10 days 11/24/15  Yes [facility], PA-C  Azelastine-Fluticasone [facility]) 137-50 MCG/ACT SUSP Place 2 sprays into the nose daily.   Yes Historical Provider, MD  BREO ELLIPTA 200-25 MCG/INH AEPB Inhale 1 puff into the lungs daily. 09/24/15  Yes Historical Provider, MD  carvedilol (COREG) 12.5 MG tablet Take 1 tablet (12.5 mg total) by mouth 2 (two) times daily with a meal. 09/01/15  Yes Josue Hector, MD  doxycycline (VIBRAMYCIN) 100 MG capsule Take 1 capsule (100 mg total) by mouth 2 (two) times daily. Patient taking differently: Take 100 mg by mouth 2 (two) times daily. Started 07/18 for 10 days 11/24/15  Yes [facility] Ward, PA-C  gabapentin (NEURONTIN) 300 MG capsule Take 1 capsule by mouth 2 (two) times daily.  04/21/14  Yes Historical Provider, MD  lamoTRIgine (LAMICTAL) 200 MG tablet Take 300 mg by mouth daily.   Yes Historical Provider, MD  levocetirizine (XYZAL) 5 MG tablet Take 5 mg by mouth every evening.  Yes Historical Provider, MD  levothyroxine (SYNTHROID, LEVOTHROID) 100 MCG tablet Take 100 mcg by mouth at bedtime.    Yes Historical Provider, MD  LORazepam (ATIVAN) 0.5 MG tablet Take 0.5 mg by mouth 4 (four) times daily as needed for anxiety.   Yes Historical Provider, MD  Lurasidone HCl (LATUDA) 60 MG TABS Take 60 mg by mouth every evening.   Yes Historical Provider, MD  metFORMIN (GLUCOPHAGE) 1000 MG tablet Take 1,000 mg by mouth daily with breakfast.   Yes Historical Provider, MD  montelukast (SINGULAIR) 10 MG tablet Take 10 mg by mouth at bedtime.   Yes Historical Provider, MD  naproxen sodium (ANAPROX) 220 MG tablet Take 220 mg by mouth every 6 (six) hours as needed (for pain).   Yes Historical Provider, MD  Nutritional Supplements (ESTROVEN WEIGHT MANAGEMENT) CAPS Take 1 capsule by mouth daily with breakfast.   Yes Historical  Provider, MD  oxyCODONE-acetaminophen (PERCOCET) 7.5-325 MG tablet Take 1 tablet by mouth every 4 (four) hours as needed. for pain 11/11/15  Yes Historical Provider, MD  QUEtiapine (SEROQUEL) 400 MG tablet Take 400 mg by mouth at bedtime.   Yes Historical Provider, MD  rosuvastatin (CRESTOR) 10 MG tablet Take 1 tablet (10 mg total) by mouth 2 (two) times a week. Patient not taking: Reported on 11/26/2015 10/26/15   Josue Hector, MD   BP 117/80 mmHg  Pulse 73  Temp(Src)   Resp 16  Ht 5\' 9"  (1.753 m)  Wt 261 lb (118.389 kg)  BMI 38.53 kg/m2  SpO2 94%  LMP  (LMP Unknown) Physical Exam  Constitutional: She is oriented to person, place, and time. She appears well-developed and well-nourished.  HENT:  Head: Normocephalic and atraumatic.  Right Ear: External ear normal.  Left Ear: External ear normal.  Nose: Nose normal.  Mouth/Throat: Oropharynx is clear and moist.  Eyes: Conjunctivae and EOM are normal. Pupils are equal, round, and reactive to light.  Neck: Normal range of motion. Neck supple.  Cardiovascular: Normal rate, regular rhythm, normal heart sounds and intact distal pulses.   Pulmonary/Chest: Effort normal and breath sounds normal.  Abdominal: Soft. Bowel sounds are normal.  Musculoskeletal: Normal range of motion.  Neurological: She is alert and oriented to person, place, and time.  Skin: Skin is warm and dry.     The cellulitis has been demarcated 2 days ago and the cellulitis has not extended past lines.  Psychiatric: She has a normal mood and affect. Her behavior is normal. Judgment and thought content normal.  Nursing note and vitals reviewed.   ED Course  .Marland KitchenIncision and Drainage Date/Time: 11/26/2015 3:26 PM Performed by: Isla Pence Authorized by: Isla Pence Consent: Verbal consent obtained. Risks and benefits: risks, benefits and alternatives were discussed Consent given by: patient Patient understanding: patient states understanding of the procedure  being performed Patient consent: the patient's understanding of the procedure matches consent given Procedure consent: procedure consent matches procedure scheduled Relevant documents: relevant documents present and verified Test results: test results available and properly labeled Site marked: the operative site was marked Imaging studies: imaging studies available Required items: required blood products, implants, devices, and special equipment available Patient identity confirmed: verbally with patient Time out: Immediately prior to procedure a "time out" was called to verify the correct patient, procedure, equipment, support staff and site/side marked as required. Type: abscess Body area: lower extremity Location details: left foot Anesthesia: local infiltration Local anesthetic: lidocaine 2% with epinephrine Anesthetic total: 2 ml Patient sedated: no  Scalpel size: 11 Incision type: single straight Complexity: simple Drainage: purulent and  bloody Drainage amount: scant Wound treatment: wound left open Patient tolerance: Patient tolerated the procedure well with no immediate complications   (including critical care time) Labs Review Labs Reviewed  BASIC METABOLIC PANEL - Abnormal; Notable for the following:    Chloride 99 (*)    Glucose, Bld 341 (*)    All other components within normal limits  CBC WITH DIFFERENTIAL/PLATELET    Imaging Review No results found. I have personally reviewed and evaluated these images and lab results as part of my medical decision-making.   EKG Interpretation None      MDM  Pt given 1 dose of IV Vancomycin here.  She was told to continue abx and to return if worse.  She is comfortable with that plan.  Final diagnoses:  Cellulitis and abscess of leg       Isla Pence, MD 11/26/15 1529

## 2015-11-26 NOTE — Progress Notes (Signed)
Pharmacy Antibiotic Note  Veronica Rivers is a 50 y.o. female with PMHx diabetes and cellulitis on left foot in the past, seen in ED 2 days ago for left ankle pain and sent home on PO doxycycline and amoxicillin, presents again on 11/26/2015 with worsening redness, pain, and swelling. Pharmacy has been consulted for Vancomycin dosing for cellulitis.    BMET ordered, not collected yet Weight = 118.4kg  Plan: Vancomycin 2000mg  IV x1 Goal trough 10-15 mcg/ml F/u CrCl normalized for maintenance doses   Height: 5\' 9"  (175.3 cm) Weight: 261 lb (118.389 kg) IBW/kg (Calculated) : 66.2  No data recorded.  No results for input(s): WBC, CREATININE, LATICACIDVEN, VANCOTROUGH, VANCOPEAK, VANCORANDOM, GENTTROUGH, GENTPEAK, GENTRANDOM, TOBRATROUGH, TOBRAPEAK, TOBRARND, AMIKACINPEAK, AMIKACINTROU, AMIKACIN in the last 168 hours.  CrCl cannot be calculated (Patient has no serum creatinine result on file.).    Allergies  Allergen Reactions  . Zocor [Simvastatin]     Caused pain and hot flashes  . Sulfa Drugs Cross Reactors Rash    Antimicrobials this admission: 7/20 Vancomycin >>   Dose adjustments this admission:   Microbiology results: None  Thank you for allowing pharmacy to be a part of this patient's care.  Ralene Bathe, PharmD, BCPS 11/26/2015, 2:07 PM  Pager: [phone]

## 2015-11-27 ENCOUNTER — Other Ambulatory Visit: Payer: Self-pay | Admitting: Cardiovascular Disease

## 2015-12-01 ENCOUNTER — Encounter (HOSPITAL_COMMUNITY): Payer: Self-pay | Admitting: Emergency Medicine

## 2015-12-01 ENCOUNTER — Emergency Department (HOSPITAL_COMMUNITY)
Admission: EM | Admit: 2015-12-01 | Discharge: 2015-12-01 | Disposition: A | Discharge status: Home / Self Care | Payer: 59 | Attending: Emergency Medicine | Admitting: Emergency Medicine

## 2015-12-01 DIAGNOSIS — Z7984 Long term (current) use of oral hypoglycemic drugs: Secondary | ICD-10-CM | POA: Insufficient documentation

## 2015-12-01 DIAGNOSIS — J45909 Unspecified asthma, uncomplicated: Secondary | ICD-10-CM | POA: Insufficient documentation

## 2015-12-01 DIAGNOSIS — Z87891 Personal history of nicotine dependence: Secondary | ICD-10-CM | POA: Diagnosis not present

## 2015-12-01 DIAGNOSIS — E785 Hyperlipidemia, unspecified: Secondary | ICD-10-CM | POA: Insufficient documentation

## 2015-12-01 DIAGNOSIS — Z792 Long term (current) use of antibiotics: Secondary | ICD-10-CM | POA: Insufficient documentation

## 2015-12-01 DIAGNOSIS — Z79899 Other long term (current) drug therapy: Secondary | ICD-10-CM | POA: Insufficient documentation

## 2015-12-01 DIAGNOSIS — E039 Hypothyroidism, unspecified: Secondary | ICD-10-CM | POA: Insufficient documentation

## 2015-12-01 DIAGNOSIS — L02612 Cutaneous abscess of left foot: Secondary | ICD-10-CM | POA: Diagnosis not present

## 2015-12-01 DIAGNOSIS — E119 Type 2 diabetes mellitus without complications: Secondary | ICD-10-CM | POA: Insufficient documentation

## 2015-12-01 DIAGNOSIS — F3131 Bipolar disorder, current episode depressed, mild: Secondary | ICD-10-CM | POA: Insufficient documentation

## 2015-12-01 DIAGNOSIS — L0291 Cutaneous abscess, unspecified: Secondary | ICD-10-CM

## 2015-12-01 DIAGNOSIS — L539 Erythematous condition, unspecified: Secondary | ICD-10-CM | POA: Diagnosis present

## 2015-12-01 DIAGNOSIS — I10 Essential (primary) hypertension: Secondary | ICD-10-CM | POA: Insufficient documentation

## 2015-12-01 LAB — CBC WITH DIFFERENTIAL/PLATELET
BASOS ABS: 0 10*3/uL (ref 0.0–0.1)
BASOS PCT: 0 %
Eosinophils Absolute: 0.1 10*3/uL (ref 0.0–0.7)
Eosinophils Relative: 2 %
HEMATOCRIT: 44.5 % (ref 36.0–46.0)
HEMOGLOBIN: 14.6 g/dL (ref 12.0–15.0)
LYMPHS PCT: 38 %
Lymphs Abs: 2.5 10*3/uL (ref 0.7–4.0)
MCH: 31.3 pg (ref 26.0–34.0)
MCHC: 32.8 g/dL (ref 30.0–36.0)
MCV: 95.3 fL (ref 78.0–100.0)
Monocytes Absolute: 0.6 10*3/uL (ref 0.1–1.0)
Monocytes Relative: 10 %
NEUTROS ABS: 3.4 10*3/uL (ref 1.7–7.7)
NEUTROS PCT: 50 %
Platelets: 223 10*3/uL (ref 150–400)
RBC: 4.67 MIL/uL (ref 3.87–5.11)
RDW: 13.1 % (ref 11.5–15.5)
WBC: 6.8 10*3/uL (ref 4.0–10.5)

## 2015-12-01 LAB — BASIC METABOLIC PANEL
ANION GAP: 10 (ref 5–15)
BUN: 18 mg/dL (ref 6–20)
CALCIUM: 9.5 mg/dL (ref 8.9–10.3)
CO2: 27 mmol/L (ref 22–32)
Chloride: 97 mmol/L — ABNORMAL LOW (ref 101–111)
Creatinine, Ser: 0.96 mg/dL (ref 0.44–1.00)
GLUCOSE: 274 mg/dL — AB (ref 65–99)
POTASSIUM: 4.4 mmol/L (ref 3.5–5.1)
SODIUM: 134 mmol/L — AB (ref 135–145)

## 2015-12-01 LAB — I-STAT CG4 LACTIC ACID, ED: Lactic Acid, Venous: 1.67 mmol/L (ref 0.5–1.9)

## 2015-12-01 MED ORDER — MORPHINE SULFATE (PF) 4 MG/ML IV SOLN
4.0000 mg | Freq: Once | INTRAVENOUS | Status: AC
  Administered 2015-12-01: 4 mg via INTRAVENOUS
  Filled 2015-12-01: qty 1

## 2015-12-01 MED ORDER — CLINDAMYCIN HCL 300 MG PO CAPS: 300.0000 mg | ORAL_CAPSULE | Freq: Four times a day (QID) | ORAL | 0 refills | Status: DC

## 2015-12-01 MED ORDER — LIDOCAINE HCL (PF) 1 % IJ SOLN
5.0000 mL | Freq: Once | INTRAMUSCULAR | Status: DC
  Filled 2015-12-01: qty 30

## 2015-12-01 MED ORDER — CLINDAMYCIN PHOSPHATE 600 MG/50ML IV SOLN
600.0000 mg | Freq: Once | INTRAVENOUS | Status: AC
  Administered 2015-12-01: 600 mg via INTRAVENOUS
  Filled 2015-12-01: qty 50

## 2015-12-01 MED ORDER — SODIUM CHLORIDE 0.9 % IV SOLN
INTRAVENOUS | Status: DC
Start: 2015-12-01 — End: 2015-12-01
  Administered 2015-12-01: 15:00:00 via INTRAVENOUS

## 2015-12-01 MED ORDER — ONDANSETRON HCL 4 MG/2ML IJ SOLN
4.0000 mg | Freq: Once | INTRAMUSCULAR | Status: AC
  Administered 2015-12-01: 4 mg via INTRAVENOUS
  Filled 2015-12-01: qty 2

## 2015-12-01 NOTE — ED Provider Notes (Signed)
[facility] DEPT Provider Note   CSN: [id number] Arrival date & time: 12/01/15  1231  First Provider Contact:  First MD Initiated Contact with Patient 12/01/15 1448        History   Chief Complaint Chief Complaint  Patient presents with  . Cellulitis    HPI Veronica Rivers is a 50 y.o. female. She presents for evaluation of persistent and worsening swelling and pain in her left foot. She saw her primary care provider yesterday who advised that she seek care for an incision and drainage procedure. She is currently taking Keflex and doxycycline for an infection in her left foot. She was treated previously with incision and drainage and started on amoxicillin and doxycycline, following that, her PCP placed her on Keflex and stopped the amoxicillin. She reports her blood sugars been elevated over 200. She denies fever, chills, nausea, vomiting, anorexia, cough, chest pain, abdominal pain or back pain. She has pain when she tries to get her foot wet. The foot feels better when she elevates it. She came here today by private vehicle. No prior similar problems. She was initially injured couple weeks ago, when she sprained her foot, [place]. There are no other known modifying factors.  HPI  Past Medical History:  Diagnosis Date  . Asthma   . Bipolar depression ([place])   . Cellulitis   . CHEST PAIN   . DM   . DYSPNEA   . HYPERCHOLESTEROLEMIA   . HYPERLIPIDEMIA   . HYPOTHYROIDISM   . Neuropathy ([place])   . SUPRAVENTRICULAR TACHYCARDIA     Patient Active Problem List   Diagnosis Date Noted  . Cellulitis and abscess of leg 08/14/2014  . Diabetic neuropathy ([place]) 08/14/2014  . Bipolar depression ([place]) 08/14/2014  . Hypothyroidism 04/20/2009  . Type 2 diabetes mellitus ([place]) 04/20/2009  . Elevated lipids 04/20/2009  . Essential hypertension 04/20/2009  . SUPRAVENTRICULAR TACHYCARDIA 04/20/2009    Past Surgical History:  Procedure Laterality Date  .  CESAREAN SECTION    . FOOT SURGERY    . NASAL SEPTUM SURGERY    . NECK SURGERY    . TONSILLECTOMY    . TUBAL LIGATION      OB History    No data available       Home Medications    Prior to Admission medications   Medication Sig Start Date End Date Taking? Authorizing Provider  albuterol (PROVENTIL HFA;VENTOLIN HFA) 108 (90 BASE) MCG/ACT inhaler Inhale 1-2 puffs into the lungs every 4 (four) hours as needed for wheezing or shortness of breath.    Yes Historical Provider, MD  Azelastine-Fluticasone (DYMISTA) 137-50 MCG/ACT SUSP Place 2 sprays into the nose daily.   Yes Historical Provider, MD  BREO ELLIPTA 200-25 MCG/INH AEPB Inhale 1 puff into the lungs daily. 09/24/15  Yes Historical Provider, MD  carvedilol (COREG) 12.5 MG tablet TAKE 1 TABLET (12.5 MG TOTAL) BY MOUTH 2 (TWO) TIMES DAILY WITH A MEAL. 11/27/15  Yes Josue Hector, MD  cephALEXin (KEFLEX) 500 MG capsule Take 500 mg by mouth 4 (four) times daily.   Yes Historical Provider, MD  diphenhydrAMINE (BENADRYL) 25 MG tablet Take 25 mg by mouth daily as needed (muscle spasms).   Yes Historical Provider, MD  doxycycline (VIBRAMYCIN) 100 MG capsule Take 1 capsule (100 mg total) by mouth 2 (two) times daily. Patient taking differently: Take 100 mg by mouth 2 (two) times daily. Started 07/18 for 10 days 11/24/15  Yes York Cerise  [place] Ward, PA-C  FIBER SELECT GUMMIES PO Take 3 tablets by mouth 3 (three) times a week.   Yes Historical Provider, MD  gabapentin (NEURONTIN) 300 MG capsule Take 1 capsule by mouth 2 (two) times daily.  04/21/14  Yes Historical Provider, MD  lamoTRIgine (LAMICTAL) 200 MG tablet Take 300 mg by mouth daily.   Yes Historical Provider, MD  levocetirizine (XYZAL) 5 MG tablet Take 5 mg by mouth every evening.   Yes Historical Provider, MD  levothyroxine (SYNTHROID, LEVOTHROID) 100 MCG tablet Take 100 mcg by mouth at bedtime.    Yes Historical Provider, MD  LORazepam (ATIVAN) 0.5 MG tablet Take 0.5 mg by mouth 4 (four)  times daily as needed for anxiety.   Yes Historical Provider, MD  Lurasidone HCl (LATUDA) 60 MG TABS Take 60 mg by mouth every evening.   Yes Historical Provider, MD  metFORMIN (GLUCOPHAGE) 1000 MG tablet Take 1,000 mg by mouth daily with breakfast.   Yes Historical Provider, MD  montelukast (SINGULAIR) 10 MG tablet Take 10 mg by mouth at bedtime.   Yes Historical Provider, MD  naproxen sodium (ANAPROX) 220 MG tablet Take 220 mg by mouth 2 (two) times daily as needed (for pain).    Yes Historical Provider, MD  Nutritional Supplements (ESTROVEN WEIGHT MANAGEMENT) CAPS Take 1 capsule by mouth every evening.    Yes Historical Provider, MD  oxyCODONE-acetaminophen (PERCOCET) 7.5-325 MG tablet Take 1 tablet by mouth every 6 (six) hours as needed for moderate pain. for pain 11/11/15  Yes Historical Provider, MD  QUEtiapine (SEROQUEL) 400 MG tablet Take 400 mg by mouth at bedtime.   Yes Historical Provider, MD  rosuvastatin (CRESTOR) 10 MG tablet Take 1 tablet (10 mg total) by mouth 2 (two) times a week. 10/26/15  Yes Josue Hector, MD  amoxicillin (AMOXIL) 500 MG capsule Take 1 capsule (500 mg total) by mouth 2 (two) times daily. Patient taking differently: Take 500 mg by mouth 2 (two) times daily. Started 07/18 for 10 days 11/24/15   [facility] Ward, PA-C  clindamycin (CLEOCIN) 300 MG capsule Take 1 capsule (300 mg total) by mouth 4 (four) times daily. X 7 days 12/01/15   Daleen Bo, MD    Family History Family History  Problem Relation Age of Onset  . Hypertension Mother   . COPD Mother     Died age 38  . Cancer Father     Lung, died in his 60s    Social History Social History  Substance Use Topics  . Smoking status: Former Smoker    Years: 15.00    Types: Cigarettes    Quit date: 11/23/1997  . Smokeless tobacco: Never Used  . Alcohol use No     Allergies   Zocor [simvastatin] and Sulfa drugs cross reactors   Review of Systems Review of Systems  All other systems reviewed and  are negative.    Physical Exam Updated Vital Signs BP 130/89 (BP Location: Left Arm)   Pulse 79   Temp 98 F (36.7 C) (Oral)   Resp 18   LMP  (LMP Unknown) Comment: post-meno  SpO2 100%   Physical Exam  Constitutional: She appears well-developed and well-nourished. No distress.  HENT:  Head: Normocephalic and atraumatic.  Eyes: Conjunctivae are normal.  Neck: Neck supple.  Cardiovascular: Normal rate.   No murmur heard. Pulmonary/Chest: Effort normal. No respiratory distress.  Musculoskeletal: She exhibits no edema.  Left mid foot, lateral aspect, with 1.5 cm indurated area, and central  wound, consistent with prior I&D site. There is very faint erythema of the midfoot, but does not appear to have extended beyond lines which were drawn on her prior visit to the emergency department, when she had her I&D. There is no apparent streaking. There is mild diffuse left popliteal tenderness without discrete palpable adenopathy. There is no calf tenderness. Normal range of motion. Left leg, ankle and foot. There is mild pain in the left foot with dorsiflexion, passively.  Neurological: She is alert.  Skin: Skin is warm and dry.  Psychiatric: She has a normal mood and affect.  Nursing note and vitals reviewed.    ED Treatments / Results  Labs (all labs ordered are listed, but only abnormal results are displayed) Labs Reviewed  BASIC METABOLIC PANEL - Abnormal; Notable for the following:       Result Value   Sodium 134 (*)    Chloride 97 (*)    Glucose, Bld 274 (*)    All other components within normal limits  AEROBIC CULTURE (SUPERFICIAL SPECIMEN)  CBC WITH DIFFERENTIAL/PLATELET  I-STAT CG4 LACTIC ACID, ED    EKG  EKG Interpretation None       Radiology No results found.  Procedures .Marland KitchenIncision and Drainage Date/Time: 12/01/2015 5:40 PM Performed by: Daleen Bo Authorized by: Daleen Bo   Consent:    Consent obtained:  Verbal   Consent given by:  Patient    Risks discussed:  Bleeding, incomplete drainage and pain   Alternatives discussed:  Alternative treatment Location:    Type:  Abscess (left dorsal foot)   Size:  1.5 cm   Location:  Lower extremity   Lower extremity location:  Foot   Foot location:  L foot Pre-procedure details:    Skin preparation:  Antiseptic wash and Betadine Anesthesia (see MAR for exact dosages):    Anesthesia method:  Local infiltration   Local anesthetic:  Lidocaine 1% w/o epi Procedure type:    Complexity:  Simple Procedure details:    Incision types:  Single straight   Incision depth:  Subcutaneous   Scalpel blade:  11   Wound management:  Probed and deloculated   Drainage:  Bloody   Drainage amount:  Scant   Wound treatment:  Wound left open Post-procedure details:    Patient tolerance of procedure:  Tolerated well, no immediate complications Comments:     Cultured drainage fluid (blood). Packing is not indicated.   (including critical care time)  Medications Ordered in ED Medications  0.9 %  sodium chloride infusion ( Intravenous Stopped 12/01/15 1741)  lidocaine (PF) (XYLOCAINE) 1 % injection 5 mL (not administered)  clindamycin (CLEOCIN) IVPB 600 mg (0 mg Intravenous Stopped 12/01/15 1617)  morphine 4 MG/ML injection 4 mg (4 mg Intravenous Given 12/01/15 1526)  ondansetron (ZOFRAN) injection 4 mg (4 mg Intravenous Given 12/01/15 1525)     Initial Impression / Assessment and Plan / ED Course  I have reviewed the triage vital signs and the nursing notes.  Pertinent labs & imaging results that were available during my care of the patient were reviewed by me and considered in my medical decision making (see chart for details).  Clinical Course    Medications  0.9 %  sodium chloride infusion ( Intravenous Stopped 12/01/15 1741)  lidocaine (PF) (XYLOCAINE) 1 % injection 5 mL (not administered)  clindamycin (CLEOCIN) IVPB 600 mg (0 mg Intravenous Stopped 12/01/15 1617)  morphine 4 MG/ML injection  4 mg (4 mg Intravenous Given 12/01/15 1526)  ondansetron (  ZOFRAN) injection 4 mg (4 mg Intravenous Given 12/01/15 1525)    Patient Vitals for the past 24 hrs:  BP Temp Temp src Pulse Resp SpO2  12/01/15 1742 130/89 - - 79 18 100 %  12/01/15 1510 130/82 98 F (36.7 C) Oral 80 18 92 %  12/01/15 1258 135/81 98.4 F (36.9 C) Oral 86 16 93 %    5:54 PM Reevaluation with update and discussion. After initial assessment and treatment, an updated evaluation reveals she is comfortable now, post I&D. Findings discussed with patient and husband, all questions answered. Marcellous Snarski L    Final Clinical Impressions(s) / ED Diagnoses   Final diagnoses:  Abscess    Persistent swelling, post recent I&D, with absence of purulent drainage, after I&D today. Likely inflammatory process, and improving infection. No evidence for systemic infection or metabolic instability. Will change antibiotics to clindamycin, have instructed on wound care and encouraged follow-up in 2 days.   Nursing Notes Reviewed/ Care Coordinated Applicable Imaging Reviewed Interpretation of Laboratory Data incorporated into ED treatment  The patient appears reasonably screened and/or stabilized for discharge and I doubt any other medical condition or other [facility] requiring further screening, evaluation, or treatment in the ED at this time prior to discharge.  Plan: Home Medications- continue her usual medications, stop Keflex and doxycycline; Home Treatments- warm soaks 3 times a day; return here if the recommended treatment, does not improve the symptoms; Recommended follow up- PCP follow-up 2 days, return here if needed.   New Prescriptions New Prescriptions   CLINDAMYCIN (CLEOCIN) 300 MG CAPSULE    Take 1 capsule (300 mg total) by mouth 4 (four) times daily. X 7 days     Daleen Bo, MD 12/01/15 1754

## 2015-12-01 NOTE — ED Triage Notes (Signed)
Pt states that she has been here 3 times for cellulitis of lt foot. Has already had it lanced, had IV abx, and oral abx at home.  Has returned today on recommendation of her PCP for further tx as it has not healed and has increased in size outside of borders that were drawn.

## 2015-12-01 NOTE — Discharge Instructions (Signed)
Soak the wound of the left foot in warm water, 3 times a day for 20 minutes.  Clean the wound well with soap and water at least twice a day.  Wear a light bandage on the wound, for drainage.  Return here if needed, for problems.

## 2015-12-04 LAB — AEROBIC CULTURE  (SUPERFICIAL SPECIMEN): CULTURE: NO GROWTH

## 2015-12-04 LAB — AEROBIC CULTURE W GRAM STAIN (SUPERFICIAL SPECIMEN)

## 2016-01-14 ENCOUNTER — Ambulatory Visit: Payer: 59 | Admitting: *Deleted

## 2016-01-28 ENCOUNTER — Ambulatory Visit: Payer: BC Managed Care – PPO | Admitting: *Deleted

## 2016-02-11 ENCOUNTER — Ambulatory Visit: Payer: BC Managed Care – PPO | Admitting: Skilled Nursing Facility1

## 2016-04-20 ENCOUNTER — Encounter: Payer: 59 | Attending: Family Medicine | Admitting: *Deleted

## 2016-04-20 DIAGNOSIS — Z713 Dietary counseling and surveillance: Secondary | ICD-10-CM | POA: Insufficient documentation

## 2016-04-20 DIAGNOSIS — E669 Obesity, unspecified: Secondary | ICD-10-CM | POA: Insufficient documentation

## 2016-04-20 DIAGNOSIS — E119 Type 2 diabetes mellitus without complications: Secondary | ICD-10-CM | POA: Diagnosis not present

## 2016-04-20 DIAGNOSIS — E1142 Type 2 diabetes mellitus with diabetic polyneuropathy: Secondary | ICD-10-CM

## 2016-04-20 NOTE — Progress Notes (Signed)
Diabetes Self-Management Education  Visit Type: First/Initial  Appt. Start Time: 0830 Appt. End Time: 1000  04/20/2016  [name], identified by name and date of birth, is a 50 y.o. female with a diagnosis of Diabetes: Type 2. Patient states she has had Diabetes since 2005, no previous education. 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 She is frustrated with elevated A1c and her obesity. She is testing once a day in AM and is taking Metformin as prescribed. She goes to the gym 3 days a week for about 45 minutes.   ASSESSMENT  There were no vitals taken for this visit.  Patient declined There is no height or weight on file to calculate BMI.      Diabetes Self-Management Education - 04/20/16 0837      Visit Information   Visit Type First/Initial     Initial Visit   Diabetes Type Type 2   Are you currently following a meal plan? No   Are you taking your medications as prescribed? Yes   Date Diagnosed 2005     Health Coping   How would you rate your overall health? Fair     Psychosocial Assessment   Patient Belief/Attitude about Diabetes Motivated to manage diabetes   Self-care barriers None   Other persons present Patient   Patient Concerns Nutrition/Meal planning;Glycemic Control;Weight Control   Special Needs None   What is the last grade level you completed in school? Bachelor's Degree     Pre-Education Assessment   Patient understands the diabetes disease and treatment process. Needs Instruction   Patient understands incorporating nutritional management into lifestyle. Needs Review   Patient undertands incorporating physical activity into lifestyle. Needs Review   Patient understands using medications safely. Needs Review   Patient understands monitoring blood glucose, interpreting and using results Needs Review   Patient understands prevention, detection, and treatment of acute complications. Needs Instruction   Patient understands  prevention, detection, and treatment of chronic complications. Needs Review   Patient understands how to develop strategies to address psychosocial issues. Needs Review   Patient understands how to develop strategies to promote health/change behavior. Needs Review     Complications   Last HgB A1C per patient/outside source 9.7 %   How often do you check your blood sugar? 1-2 times/day   Fasting Blood glucose range (mg/dL) >200;180-200   Number of hypoglycemic episodes per month 0   Have you had a dilated eye exam in the past 12 months? No   Have you had a dental exam in the past 12 months? Yes   Are you checking your feet? Yes   How many days per week are you checking your feet? 7     Dietary Intake   Breakfast Toast with PNB   Snack (morning) no   Lunch left overs; meat , starch, vegetables,    Snack (afternoon) fresh fruit or yogurt   Dinner chicken or other lean meat, stir fry or tacos [place] and burger meatloaf, tortillas and 1Tbsp sour cream    Snack (evening) tries not to, but may have tortilla or fresh fruit or yogurt   Beverage(s) coffee with coffee mate, diet soda, water     Exercise   Exercise Type Light (walking / raking leaves)   How many days per week to you exercise? 3   How many minutes per day do you exercise? 45   Total minutes per week of exercise 135     Patient Education  Previous Diabetes Education No   Disease state  Definition of diabetes, type 1 and 2, and the diagnosis of diabetes   Nutrition management  Role of diet in the treatment of diabetes and the relationship between the three main macronutrients and blood glucose level;Food label reading, portion sizes and measuring food.;Carbohydrate counting;Reviewed blood glucose goals for pre and post meals and how to evaluate the patients' food intake on their blood glucose level.   Physical activity and exercise  Role of exercise on diabetes management, blood pressure control and cardiac health.   Medications  Reviewed patients medication for diabetes, action, purpose, timing of dose and side effects.   Monitoring Identified appropriate SMBG and/or A1C goals.   Chronic complications Relationship between chronic complications and blood glucose control   Psychosocial adjustment Role of stress on diabetes     Individualized Goals (developed by patient)   Nutrition Follow meal plan discussed   Physical Activity Exercise 3-5 times per week   Medications take my medication as prescribed   Monitoring  test blood glucose pre and post meals as discussed     Post-Education Assessment   Patient understands the diabetes disease and treatment process. Demonstrates understanding / competency   Patient understands incorporating nutritional management into lifestyle. Demonstrates understanding / competency   Patient undertands incorporating physical activity into lifestyle. Demonstrates understanding / competency   Patient understands using medications safely. Demonstrates understanding / competency   Patient understands monitoring blood glucose, interpreting and using results Demonstrates understanding / competency   Patient understands prevention, detection, and treatment of chronic complications. Demonstrates understanding / competency     Outcomes   Expected Outcomes Demonstrated interest in learning. Expect positive outcomes   Future DMSE 4-6 wks   Program Status Not Completed      Individualized Plan for Diabetes Self-Management Training:   Learning Objective:  Patient will have a greater understanding of diabetes self-management. Patient education plan is to attend individual and/or group sessions per assessed needs and concerns.   Plan:   Patient Instructions  Plan:  Aim for 3 Carb Choices per meal (45 grams) +/- 1 either way  Aim for 0-1 Carbs per snack if hungry  Include protein in moderation with your meals and snacks Consider reading food labels for Total Carbohydrate of foods Continue  with your activity level by walking and working out daily as tolerated Consider Arm Chair exercises for days you don't go to the gym Consider checking BG at alternate times per day especially after exercise Contineu taking medication as directed by MD      Expected Outcomes:  Demonstrated interest in learning. Expect positive outcomes  Education material provided: Living Well with Diabetes, A1C conversion sheet, Meal plan card and Carbohydrate counting sheet, Diabetes Medication handout, Resource Guide for phone APPS  If problems or questions, patient to contact team via:  Phone and Email  Future DSME appointment: 4-6 wks

## 2016-04-20 NOTE — Patient Instructions (Signed)
Plan:  Aim for 3 Carb Choices per meal (45 grams) +/- 1 either way  Aim for 0-1 Carbs per snack if hungry  Include protein in moderation with your meals and snacks Consider reading food labels for Total Carbohydrate of foods Continue with your activity level by walking and working out daily as tolerated Consider Arm Chair exercises for days you don't go to the gym Consider checking BG at alternate times per day especially after exercise Contineu taking medication as directed by MD

## 2016-06-01 ENCOUNTER — Ambulatory Visit: Payer: BC Managed Care – PPO | Admitting: *Deleted

## 2016-06-16 ENCOUNTER — Ambulatory Visit (INDEPENDENT_AMBULATORY_CARE_PROVIDER_SITE_OTHER): Payer: 59

## 2016-06-16 ENCOUNTER — Other Ambulatory Visit: Payer: Self-pay | Admitting: *Deleted

## 2016-06-16 ENCOUNTER — Ambulatory Visit: Payer: 59

## 2016-06-16 ENCOUNTER — Encounter: Payer: Self-pay | Admitting: Podiatry

## 2016-06-16 ENCOUNTER — Ambulatory Visit (INDEPENDENT_AMBULATORY_CARE_PROVIDER_SITE_OTHER): Payer: 59 | Admitting: Podiatry

## 2016-06-16 VITALS — BP 112/73 | HR 87 | Resp 16

## 2016-06-16 DIAGNOSIS — M722 Plantar fascial fibromatosis: Secondary | ICD-10-CM

## 2016-06-16 DIAGNOSIS — Q828 Other specified congenital malformations of skin: Secondary | ICD-10-CM

## 2016-06-16 MED ORDER — MELOXICAM 15 MG PO TABS
15.0000 mg | ORAL_TABLET | Freq: Every day | ORAL | 3 refills | Status: DC
Start: 2016-06-16 — End: 2016-08-16

## 2016-06-16 NOTE — Progress Notes (Signed)
   Subjective:    Patient ID: Veronica Rivers, female    DOB: [birth year], 51 y.o.   MRN: [id number]  HPI: Vital signs are stable alert and oriented 3. She presents today with a 3 week complaint of pain to the left heel. She states that initially was more of the lateral foot morning pain seems to be the worst. She states that she went to the walk-in clinic and they said that she had plantar fasciitis referred her here. She tried ibuprofen and arthritis cream which helped to some degree. She's also complaining of a painful lesion sub-fifth metatarsal head of the right foot which she states had surgery formed on bone to relieve this. She states it was gone for many years and has recently recurred. She also does on say that her diabetes is not well At 9.7 hemoglobin A1c.    Review of Systems  Endocrine: Positive for polydipsia and polyuria.  Musculoskeletal: Positive for arthralgias, back pain, gait problem and myalgias.  All other systems reviewed and are negative.      Objective:   Physical Exam: Vital signs are stable alert and oriented 3. Pulses are palpable. Neurologic sensorium is slightly diminished per Semmes-Weinstein monofilament degenerative reflexes are symmetrical ankle bilateral muscle strength is normal bilateral. Orthopedic evaluation of his right centimeters distal to the ankle for range of motion without crepitus she does have pain on palpation medial calcaneal tubercle of the left heel. Radiographs taken today demonstrate soft tissue increase in density of the plantar fascia calcaneal insertion site or fractures or any other abnormal osseous architecture is noted. Cutaneous evaluation and shows supple well-hydrated cutis no erythema edema cellulitis drainage or odor. Radiographs of this foot does demonstrate what appears to be an old fifth metatarsal osteotomy with no internal fixation. She also has a solitary porokeratotic lesion sub-fifth metatarsal on physical exam. No open lesions  or wounds are noted.        Assessment & Plan:  Diabetes mellitus with diabetic neuropathy and plantar fasciitis left foot. 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  Plan: The chemical debridement of the wound today plantar aspect of the right foot no lesions were noted. She has pain on palpation surgical left foot so we injected that area today with Kenalog and local anesthetic plantar fascia brace and night splint were dispensed as well as prescription for meloxicam. We discussed appropriate shoe gear stretching exercises ice therapy and shoe modifications I will follow-up with her in 1 month.

## 2016-07-12 ENCOUNTER — Encounter: Payer: Self-pay | Admitting: Podiatry

## 2016-07-12 ENCOUNTER — Ambulatory Visit (INDEPENDENT_AMBULATORY_CARE_PROVIDER_SITE_OTHER): Payer: 59 | Admitting: Podiatry

## 2016-07-12 DIAGNOSIS — M722 Plantar fascial fibromatosis: Secondary | ICD-10-CM | POA: Diagnosis not present

## 2016-07-12 NOTE — Progress Notes (Signed)
She presents today for follow-up of her plantar fasciitis. She states it is still hurting but it maybe 25% improved. She states that she continues have her medication.  Objective: Vital signs are stable alert and oriented 3 no erythema edema cellulitis drainage or odor. Moderate pain on palpation medial calcaneal tubercle of the left heel.  Assessment: Penicillin secondary plantar fasciitis 25% resolved.  Plan: Reinjected left heel today continue all other conservative therapies follow up with me in 1 month.

## 2016-08-16 ENCOUNTER — Ambulatory Visit (INDEPENDENT_AMBULATORY_CARE_PROVIDER_SITE_OTHER): Payer: 59 | Admitting: Podiatry

## 2016-08-16 ENCOUNTER — Encounter: Payer: Self-pay | Admitting: Podiatry

## 2016-08-16 DIAGNOSIS — Q828 Other specified congenital malformations of skin: Secondary | ICD-10-CM

## 2016-08-16 DIAGNOSIS — M7751 Other enthesopathy of right foot: Secondary | ICD-10-CM | POA: Diagnosis not present

## 2016-08-16 DIAGNOSIS — M779 Enthesopathy, unspecified: Secondary | ICD-10-CM

## 2016-08-16 DIAGNOSIS — M722 Plantar fascial fibromatosis: Secondary | ICD-10-CM | POA: Diagnosis not present

## 2016-08-16 DIAGNOSIS — M778 Other enthesopathies, not elsewhere classified: Secondary | ICD-10-CM

## 2016-08-16 NOTE — Progress Notes (Signed)
She presents today for follow-up of her plantar fasciitis to her left heel. She's also complaining of pain to the sub-fifth metatarsophalangeal joint area with a callus right foot. She states that the plantar fasciitis resolved for a couple weeks but has since returned. She like to consider orthotics.   Objective: Vital signs are stable alert and oriented 3. Pulses are palpable. She has pain on palpation medial calcaneal tubercle. She has Ace wall palpable bursa with a reactive porokeratotic lesion sub-fifth metatarsal of the right foot. Capsulitis is also noted with pain down in range of motion of the fifth metatarsophalangeal joint.  Assessment: Pain in limb secondary to capsulitis bursitis fifth metatarsophalangeal joint right foot porokeratosis right foot. Plantar fasciitis left foot.  Plan: I injected the fifth metatarsophalangeal joint area to debride reactive hyperkeratosis. Injected left heel but the injections were Kenalog and local anesthetic. She was also scanned for orthotics.

## 2016-09-13 ENCOUNTER — Other Ambulatory Visit: Payer: 59

## 2016-09-13 ENCOUNTER — Ambulatory Visit (INDEPENDENT_AMBULATORY_CARE_PROVIDER_SITE_OTHER): Payer: 59 | Admitting: Podiatry

## 2016-09-13 DIAGNOSIS — M722 Plantar fascial fibromatosis: Secondary | ICD-10-CM | POA: Diagnosis not present

## 2016-09-13 NOTE — Patient Instructions (Signed)

## 2016-09-13 NOTE — Progress Notes (Signed)
She presents today to pick up her orthotics in complaining of pain to the left heel.  Objective: She still has tender to palpation interval left heel pain and pulses remain palpable.  Assessment: Plantar fasciitis left.  Plan: Dispensed orthotics today was provided with both oral and written home care and use of the orthotics. We reinjected the left heel today with a lot of local anesthetic. Follow-up with her in 6 weeks

## 2016-10-25 ENCOUNTER — Encounter: Payer: Self-pay | Admitting: Podiatry

## 2016-10-25 ENCOUNTER — Ambulatory Visit (INDEPENDENT_AMBULATORY_CARE_PROVIDER_SITE_OTHER): Payer: 59 | Admitting: Podiatry

## 2016-10-25 DIAGNOSIS — M779 Enthesopathy, unspecified: Secondary | ICD-10-CM | POA: Diagnosis not present

## 2016-10-25 NOTE — Progress Notes (Signed)
She presents today for follow-up of her left heel states it hurts more on the ankle and top of the foot now.  Objective: Vital signs stable she is alert and oriented 3 she has pain on palpation of the sinus tarsi and pain on end range of motion of the subtalar joint left. She also has pain on frontal plane range of motion of Lisfranc's joints also dorsum of the foot. No edema no erythema no cellulitis drainage or odor.  Assessment: Plantar fasciitis resolving with compensatory sinus tarsitis capsulitis of the subtalar joint left foot capsulitis of Lisfranc's joints left foot more than likely compensatory in nature.  Plan: Continue to wear her orthotics on a regular basis injected dexamethasone and Kenalog positive dorsal aspect of the left foot and into the sinus tarsi after sterile Betadine skin prep. I will follow-up with her in 1 month she will continue all conservative therapies.

## 2016-10-29 NOTE — Progress Notes (Signed)
Patient ID: Veronica Rivers, female   DOB: [birth year], 51 y.o.   MRN: [id number]   Veronica Rivers is seen today for f.u of HTN, elevated lipids atypicdal chest pain history of SVT. She has been doing well. Some vetigo on antivert . She eats too many carbs and we discussed her diet and weight loss at length. She is not having recurrent palpitations or SSCP. She has been compliant with her meds.   Echo 08/12/13 essentially unremarkable  Study Conclusions  - Left ventricle: The cavity size was normal. There was mild concentric hypertrophy. Systolic function was normal. The estimated ejection fraction was in the range of 60% to 65%. Wall motion was normal; there were no regional wall motion abnormalities. Left ventricular diastolic function parameters were normal. - Left atrium: The atrium was normal in size. - Right ventricle: The cavity size was mildly dilated. Wall thickness was normal. Systolic function was normal. Lateral annulus peak S velocity: 13.2cm/s. - Right atrium: The atrium was mildly dilated. - Atrial septum: No defect or patent foramen ovale was identified. - Tricuspid valve: Poorly visualized. No significant regurgitation. Unable to calculate RVSP. - Inferior vena cava: The vessel was normal in size; the respirophasic diameter changes were in the normal range (= 50%); findings are consistent with normal central venous pressure. 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  All other systems  reviewed and negative  General: BP 118/80   Pulse 80   Ht 5' 8.5" (1.74 m)   Wt 113.3 kg (249 lb 12.8 oz)   BMI 37.43 kg/m  Affect appropriate Obese white female  HEENT: normal Neck supple with no adenopathy JVP normal no bruits no thyromegaly Lungs clear with no wheezing and good diaphragmatic motion Heart:  S1/S2 no murmur, no rub, gallop or click PMI normal Abdomen: benighn, BS positve, no tenderness, no AAA no bruit.  No HSM or HJR Distal pulses intact with no bruits No edema Neuro non-focal Skin warm and dry No muscular weakness    Current Outpatient Prescriptions  Medication Sig Dispense Refill  . albuterol (PROVENTIL HFA;VENTOLIN HFA) 108 (90 BASE) MCG/ACT inhaler Inhale 1-2 puffs into the lungs every 4 (four) hours as needed for wheezing or shortness of breath.     [name] BREO ELLIPTA 200-25 MCG/INH AEPB Inhale 1 puff into the lungs daily.  1  . carvedilol (COREG) 12.5 MG tablet TAKE 1 TABLET (12.5 MG TOTAL) BY MOUTH 2 (TWO) TIMES DAILY WITH A MEAL. 180 tablet 3  . FIBER SELECT GUMMIES PO Take 3 tablets by mouth 3 (three) times a week.    . fluticasone (FLONASE) 50 MCG/ACT nasal spray Place 1 spray into both nostrils daily.     [name] gabapentin (NEURONTIN) 300 MG capsule Take 1 capsule by mouth 2 (two) times daily.   0  . JARDIANCE 10 MG TABS tablet Take 10 mg by mouth daily.  11  . lamoTRIgine (LAMICTAL) 200 MG tablet Take 300 mg by mouth daily.    [name] LATUDA 20 MG TABS tablet Take 20 mg by mouth daily. with food  1  . LATUDA  80 MG TABS tablet Take 80 mg by mouth daily. with food  3  . levocetirizine (XYZAL) 5 MG tablet Take 5 mg by mouth every evening.    [name] levothyroxine (SYNTHROID, LEVOTHROID) 100 MCG tablet Take 100 mcg by mouth at bedtime.     [name] lisinopril (PRINIVIL,ZESTRIL) 2.5 MG tablet Take 2.5 mg by mouth daily.  0  . LORazepam (ATIVAN) 0.5 MG tablet Take 0.5 mg by mouth 4 (four) times daily as needed for anxiety.    . metFORMIN (GLUCOPHAGE-XR) 500 MG 24 hr tablet  TAKE 2 TABLETS BY MOUTH EVERY MORNING AND 2 TABLET AT NIGHT  1  . montelukast (SINGULAIR) 10 MG tablet Take 10 mg by mouth at bedtime.    . Nutritional Supplements (ESTROVEN WEIGHT MANAGEMENT) CAPS Take 1 capsule by mouth every evening.     . Oxycodone HCl 10 MG TABS Take 10 mg by mouth every 6 (six) hours as needed for pain. for pain  0  . QUEtiapine (SEROQUEL) 400 MG tablet Take 400 mg by mouth at bedtime.    [name] SYNTHROID 112 MCG tablet Take 112 mcg by mouth every morning.  0   No current facility-administered medications for this visit.     Allergies  Meloxicam; Zocor [simvastatin]; and Sulfa drugs cross reactors  Electrocardiogram:  08/07/13  SR rate 98 poor R wave progression nonspecific ST/T wave changes  10/27/14  SR rate 78 low voltage nonspecific ST changes no change from 2015  10/31/16  SR rate 80 low voltage otherwise normal   Assessment and Plan HTN:  Well controlled.  Continue current medications and low sodium Dash type diet.   DM:  Discussed low carb diet.  Target hemoglobin A1c is 6.5 or less.  Continue current medications. Chol:   Cholesterol is at goal.  Continue current dose of statin and diet Rx.  No myalgias or side effects.  F/U  LFT's in 6 months. Lab Results  Component Value Date   LDLCALC 70 07/02/2014            SVT:  Quiescent infrequent palpitaitons continue beta blocker Can take extra coreg as needed  Chest Pain:  Resolved previously normal ETT  Observe Anxiety:  On abilify seems stable Plantar Fascitis: orthotics f/u [facility]   F/U 1 year  [name]

## 2016-10-31 ENCOUNTER — Ambulatory Visit (INDEPENDENT_AMBULATORY_CARE_PROVIDER_SITE_OTHER): Payer: 59 | Admitting: Cardiovascular Disease

## 2016-10-31 ENCOUNTER — Encounter: Payer: Self-pay | Admitting: Cardiovascular Disease

## 2016-10-31 VITALS — BP 118/80 | HR 80 | Ht 68.5 in | Wt 249.8 lb

## 2016-10-31 DIAGNOSIS — I10 Essential (primary) hypertension: Secondary | ICD-10-CM

## 2016-10-31 NOTE — Patient Instructions (Addendum)

## 2016-11-03 ENCOUNTER — Telehealth: Payer: Self-pay | Admitting: Podiatry

## 2016-11-03 NOTE — Telephone Encounter (Signed)
I'm a patient of Dr. Stephenie Acres and was in there the other week and got injections in my left foot. The pain has not subsided, it has actually gotten worse and now I'm having trouble walking. Can you please call me back to see what else we can do.

## 2016-11-04 ENCOUNTER — Ambulatory Visit (INDEPENDENT_AMBULATORY_CARE_PROVIDER_SITE_OTHER): Payer: 59 | Admitting: Podiatry

## 2016-11-04 ENCOUNTER — Encounter: Payer: Self-pay | Admitting: Podiatry

## 2016-11-04 DIAGNOSIS — M779 Enthesopathy, unspecified: Secondary | ICD-10-CM | POA: Diagnosis not present

## 2016-11-04 MED ORDER — TRIAMCINOLONE ACETONIDE 10 MG/ML IJ SUSP
10.0000 mg | Freq: Once | INTRAMUSCULAR | Status: AC
  Administered 2016-11-04: 10 mg

## 2016-11-04 NOTE — Progress Notes (Signed)
Subjective:    Patient ID: Veronica Rivers, female   DOB: 51 y.o.   MRN: [id number]   HPI patient presents stating that the pain seems to be in a different place from where she had previously    ROS      Objective:  Physical Exam neurovascular status intact with diminishment of discomfort within sinus tarsi but quite a bit of discomfort in the peroneal tendon at its insertion into the base of fifth metatarsal     Assessment:    Peroneal tendinitis left     Plan:     Injected the peroneal fifth metatarsal base connection 3 mg Kenalog 5 mill grams Xylocaine and advised on ice and reappoint to recheck

## 2016-11-22 ENCOUNTER — Other Ambulatory Visit: Payer: Self-pay | Admitting: Cardiovascular Disease

## 2016-11-22 ENCOUNTER — Other Ambulatory Visit: Payer: Self-pay | Admitting: Allergy

## 2016-11-22 ENCOUNTER — Ambulatory Visit
Admission: RE | Admit: 2016-11-22 | Discharge: 2016-11-22 | Disposition: A | Discharge status: Home / Self Care | Payer: 59 | Source: Ambulatory Visit | Attending: Allergy | Admitting: Allergy

## 2016-11-22 DIAGNOSIS — J4540 Moderate persistent asthma, uncomplicated: Secondary | ICD-10-CM

## 2016-11-22 MED ORDER — CARVEDILOL 12.5 MG PO TABS: ORAL_TABLET | ORAL | 3 refills | Status: DC

## 2016-11-24 ENCOUNTER — Ambulatory Visit (INDEPENDENT_AMBULATORY_CARE_PROVIDER_SITE_OTHER): Payer: 59 | Admitting: Podiatry

## 2016-11-24 ENCOUNTER — Encounter: Payer: Self-pay | Admitting: Podiatry

## 2016-11-24 DIAGNOSIS — M779 Enthesopathy, unspecified: Secondary | ICD-10-CM | POA: Diagnosis not present

## 2016-11-24 DIAGNOSIS — Q828 Other specified congenital malformations of skin: Secondary | ICD-10-CM

## 2016-11-24 NOTE — Progress Notes (Signed)
She presents today for follow-up of her peroneal tendinitis for left foot and callus sub-fifth metatarsal on the right foot.  Objective: Pulses remain palpable porokeratotic lesion plantar aspect sub-fifth right mild tenderness on palpation of the peroneus brevis tendon of the left foot.  Assessment: Pain limb secondary to porokeratosis and tendinitis.  Plan: I injected the peroneus brevis peritendinous Lee with dexamethasone and local anesthetic. I also debrided and enucleated the porokeratotic lesion.

## 2016-12-27 ENCOUNTER — Encounter: Payer: Self-pay | Admitting: Podiatry

## 2016-12-27 ENCOUNTER — Ambulatory Visit (INDEPENDENT_AMBULATORY_CARE_PROVIDER_SITE_OTHER): Payer: 59 | Admitting: Podiatry

## 2016-12-27 DIAGNOSIS — M779 Enthesopathy, unspecified: Secondary | ICD-10-CM | POA: Diagnosis not present

## 2016-12-27 DIAGNOSIS — M722 Plantar fascial fibromatosis: Secondary | ICD-10-CM | POA: Diagnosis not present

## 2016-12-27 NOTE — Progress Notes (Signed)
She presents today states that her peroneal tendon still hurt she states that she feels that she was doing very well until she went to exercise in the pool she had known how bad it really was.  Objective: Vital signs are stable alert and oriented 3. Pulses are palpable area is pain on palpation medial calcaneal tubercle of the left heel. This is resulting in lateral compensation peroneal tendinitis and fourth fifth metatarsocuboid capsulitis.  Assessment: Chronic plantar fasciitis.  Plan: I injected the left heel today with Kenalog and local anesthetic discussed possible need for surgery she will continue use of orthotics.

## 2017-01-03 ENCOUNTER — Ambulatory Visit (INDEPENDENT_AMBULATORY_CARE_PROVIDER_SITE_OTHER): Payer: 59

## 2017-01-03 ENCOUNTER — Telehealth: Payer: Self-pay | Admitting: *Deleted

## 2017-01-03 ENCOUNTER — Encounter: Payer: Self-pay | Admitting: Podiatry

## 2017-01-03 ENCOUNTER — Ambulatory Visit (INDEPENDENT_AMBULATORY_CARE_PROVIDER_SITE_OTHER): Payer: 59 | Admitting: Podiatry

## 2017-01-03 DIAGNOSIS — M779 Enthesopathy, unspecified: Secondary | ICD-10-CM

## 2017-01-03 DIAGNOSIS — M722 Plantar fascial fibromatosis: Secondary | ICD-10-CM

## 2017-01-03 DIAGNOSIS — T148XXA Other injury of unspecified body region, initial encounter: Secondary | ICD-10-CM

## 2017-01-03 NOTE — Telephone Encounter (Addendum)
-----   Message from [place] sent at 01/03/2017 11:25 AM EDT ----- Regarding: MRI MRI left - plantar fasciitis/peroneal tendon tear left. Orders to [facility] [employer], and faxed to [place]. 01/16/2017-DrMilinda Pointer states there is a tear of the plantar fascia, and tendon tear and a fracture at the base of the 2nd metatarsal, and pt needed to be in a cam walker. I informed pt of the results and she needed to be in the cam walker and I would fit her and see when Dr. Milinda Pointer wanted to see her.01/17/2017-Pt presents for application of cam boot and is wearing a short cam boot to the left foot. I told pt this cam boot would be sufficient to immobilize foot as ordered by Dr. Milinda Pointer, and that we needed to get her an appt to be rechecked 4 weeks from 01/03/2017. I did straighten the top strap to the boot, to decrease pressure to the anterior lower leg. I also informed pt, that she had a fracture of the 2nd metatarsal, and the cam boot would immobilize the torn tendon and plantar fascia as well as the fracture and 4 weeks from 01/03/2017 would give her time for the body to begin a bone callous at the fracture site.

## 2017-01-03 NOTE — Progress Notes (Signed)
She presents today for follow-up of a peroneal tendinitis and plantar fasciitis on the left foot. States that she was on vacation a little more than a week ago and the foot became very very painful and she feels that she may have rolled the ankle and caused more foot pain. She states that the foot is so swollen that her husband had to cut her shoe off. She states it is painful but has decreased in swelling to some degree.  Objective: Pulses are palpable neurologic sensorium is intact she has considerable swelling and ecchymosis overlying the lateral and dorsal lateral aspect of the left foot beneath the lateral malleolus and plantar fascia area. Radiographs do not demonstrate any type of osseus abnormalities in this area. She has significant pain on palpation with pitting edema.  Assessment: Cannot rule out a peroneal tear and a plantar fascia tear or rupture.  Plan: Discussed etiology pathology conservative versus surgical therapies. She had previously been seen several months in advance by one of our orthopedic friends who had injected her and placed her in a Cam Walker. This did not alleviate her symptoms. Currently no treatment has alleviated her symptoms. Requesting an MRI for evaluation and possible surgical intervention.

## 2017-01-06 ENCOUNTER — Other Ambulatory Visit: Payer: 59

## 2017-01-14 ENCOUNTER — Ambulatory Visit
Admission: RE | Admit: 2017-01-14 | Discharge: 2017-01-14 | Disposition: A | Discharge status: Home / Self Care | Payer: 59 | Source: Ambulatory Visit | Attending: Podiatry | Admitting: Podiatry

## 2017-01-17 ENCOUNTER — Ambulatory Visit: Payer: 59 | Admitting: Podiatry

## 2017-01-31 ENCOUNTER — Ambulatory Visit (INDEPENDENT_AMBULATORY_CARE_PROVIDER_SITE_OTHER): Payer: 59 | Admitting: Podiatry

## 2017-01-31 ENCOUNTER — Encounter: Payer: Self-pay | Admitting: Podiatry

## 2017-01-31 DIAGNOSIS — M779 Enthesopathy, unspecified: Secondary | ICD-10-CM | POA: Diagnosis not present

## 2017-01-31 DIAGNOSIS — M722 Plantar fascial fibromatosis: Secondary | ICD-10-CM | POA: Diagnosis not present

## 2017-01-31 MED ORDER — OXYCODONE-ACETAMINOPHEN 10-325 MG PO TABS: 1.0000 | ORAL_TABLET | Freq: Three times a day (TID) | ORAL | 0 refills | Status: DC | PRN

## 2017-01-31 NOTE — Patient Instructions (Signed)
Pre-Operative Instructions  Congratulations, you have decided to take an important step towards improving your quality of life.  You can be assured that the doctors and staff at [facility] will be with you every step of the way.  Here are some important things you should know:  1. Plan to be at the surgery center/hospital at least 1 (one) hour prior to your scheduled time, unless otherwise directed by the surgical center/hospital staff.  You must have a responsible adult accompany you, remain during the surgery and drive you home.  Make sure you have directions to the surgical center/hospital to ensure you arrive on time. 2. If you are having surgery at [facility] or [facility], you will need a copy of your medical history and physical form from your family physician within one month prior to the date of surgery. We will give you a form for your primary physician to complete.  3. We make every effort to accommodate the date you request for surgery.  However, there are times where surgery dates or times have to be moved.  We will contact you as soon as possible if a change in schedule is required.   4. No aspirin/ibuprofen for one week before surgery.  If you are on aspirin, any non-steroidal anti-inflammatory medications (Mobic, Aleve, Ibuprofen) should not be taken seven (7) days prior to your surgery.  You make take Tylenol for pain prior to surgery.  5. Medications - If you are taking daily heart and blood pressure medications, seizure, reflux, allergy, asthma, anxiety, pain or diabetes medications, make sure you notify the surgery center/hospital before the day of surgery so they can tell you which medications you should take or avoid the day of surgery. 6. No food or drink after midnight the night before surgery unless directed otherwise by surgical center/hospital staff. 7. No alcoholic beverages 24-hours prior to surgery.  No smoking 24-hours prior or 24-hours after  surgery. 8. Wear loose pants or shorts. They should be loose enough to fit over bandages, boots, and casts. 9. Don't wear slip-on shoes. Sneakers are preferred. 10. Bring your boot with you to the surgery center/hospital.  Also bring crutches or a walker if your physician has prescribed it for you.  If you do not have this equipment, it will be provided for you after surgery. 11. If you have not been contacted by the surgery center/hospital by the day before your surgery, call to confirm the date and time of your surgery. 12. Leave-time from work may vary depending on the type of surgery you have.  Appropriate arrangements should be made prior to surgery with your employer. 13. Prescriptions will be provided immediately following surgery by your doctor.  Fill these as soon as possible after surgery and take the medication as directed. Pain medications will not be refilled on weekends and must be approved by the doctor. 14. Remove nail polish on the operative foot and avoid getting pedicures prior to surgery. 15. Wash the night before surgery.  The night before surgery wash the foot and leg well with water and the antibacterial soap provided. Be sure to pay special attention to beneath the toenails and in between the toes.  Wash for at least three (3) minutes. Rinse thoroughly with water and dry well with a towel.  Perform this wash unless told not to do so by your physician.  Enclosed: 1 Ice pack (please put in freezer the night before surgery)   1 Hibiclens skin cleaner     Pre-op instructions  If you have any questions regarding the instructions, please do not hesitate to call our office.  [place]: [address], , [place] [address] -- [phone]  [place]: [address] [place], [place] [address] -- [phone]  [place]: [address]  [place], [place] [address] -- [phone]  [place]: [address], [address], [place], [place] [address] -- [phone]  Website:  [email or web address] 

## 2017-02-01 NOTE — Progress Notes (Signed)
She presents today for a follow-up of her MRI. She states that she is still having significant pain. She presents with her husband [place].  Objective: Vital signs are stable alert and oriented 3. Pulses are palpable. MRI demonstrates a tear to the peroneus brevis and significant plantar fasciitis left foot.  Assessment: Plantar fasciitis peroneal tendinitis left.  Plan: She is scheduled for a endoscopic plantar fasciotomy peroneal tendon repair and cast application. We went over the consent form today line by line number by number given time to ask questions she saw fit regarding these procedures. I answered them to the best of my ability in layman's terms. She understood this was amenable to a signed operative ages of the consent form. She was given both oral and written home-going instructions for her preop she was also given information regarding anesthesia and the surgery center.

## 2017-02-15 ENCOUNTER — Other Ambulatory Visit: Payer: Self-pay | Admitting: Podiatry

## 2017-02-15 ENCOUNTER — Telehealth: Payer: Self-pay | Admitting: *Deleted

## 2017-02-15 MED ORDER — OXYCODONE-ACETAMINOPHEN 10-325 MG PO TABS: 1.0000 | ORAL_TABLET | ORAL | 0 refills | Status: DC | PRN

## 2017-02-15 MED ORDER — PROMETHAZINE HCL 25 MG PO TABS: 25.0000 mg | ORAL_TABLET | Freq: Three times a day (TID) | ORAL | 0 refills | Status: DC | PRN

## 2017-02-15 MED ORDER — CEPHALEXIN 500 MG PO CAPS: 500.0000 mg | ORAL_CAPSULE | Freq: Three times a day (TID) | ORAL | 0 refills | Status: DC

## 2017-02-15 NOTE — Telephone Encounter (Signed)
"  I'm having surgery on Friday.  I have a few questions about my surgery or actually about a couple of things before and after my surgery.  If you could just give me a call back, I'd appreciate it."

## 2017-02-17 ENCOUNTER — Telehealth: Payer: Self-pay | Admitting: *Deleted

## 2017-02-17 ENCOUNTER — Encounter: Payer: Self-pay | Admitting: Podiatry

## 2017-02-17 NOTE — Telephone Encounter (Signed)
I called and spoke to [place] at [facility] and had the authorization date changed to 02/24/2016.  She changed it and said the authorization number would be the same which is [id number].  I asked for a reference number for this call she said it is [phone].

## 2017-02-17 NOTE — Telephone Encounter (Signed)
"  I was scheduled for surgery today."  I was getting ready to call you.  Can you do surgery on October 18?  Dr. Milinda Pointer said he can do it then.  "I thought he did surgery on Fridays.  I mean Thursday is fine."  He does but he's making an exception so he can take care of his patients that were scheduled for today.  "Oh, that's nice."  Someone from the surgical center will call you with the arrival time a day or two prior to surgery date."

## 2017-02-23 ENCOUNTER — Ambulatory Visit: Payer: 59

## 2017-02-23 DIAGNOSIS — M67472 Ganglion, left ankle and foot: Secondary | ICD-10-CM | POA: Diagnosis not present

## 2017-02-23 DIAGNOSIS — M722 Plantar fascial fibromatosis: Secondary | ICD-10-CM | POA: Diagnosis not present

## 2017-02-28 ENCOUNTER — Ambulatory Visit: Payer: 59 | Admitting: Podiatry

## 2017-02-28 ENCOUNTER — Encounter: Payer: Self-pay | Admitting: Podiatry

## 2017-02-28 VITALS — BP 103/73 | HR 74 | Temp 97.6°F

## 2017-02-28 DIAGNOSIS — M779 Enthesopathy, unspecified: Secondary | ICD-10-CM

## 2017-02-28 DIAGNOSIS — M722 Plantar fascial fibromatosis: Secondary | ICD-10-CM

## 2017-02-28 NOTE — Progress Notes (Signed)
She presents today for her post op visit. She presents in a cast. Denies fever chills nausea vomiting muscle aches and pains.  Objective: Presents with her husband nonweightbearing wheelchair. Plantar aspect of thry and clean. She has good digital sation mild edema to the forefoot loose cast proximally. Good range of motion of the toes.vital signs ar blood pressure is 103/73 pulse is 74 temperature iis 97.20F.  Assessment well-healing surgical foot 5 days.  Plan: Follow up with me in 1 week for cast change.

## 2017-03-02 ENCOUNTER — Ambulatory Visit: Payer: 59

## 2017-03-08 NOTE — Progress Notes (Signed)
DOS 10.12.18 EPF LT, peroneal tendon tear repair Lt, cast app

## 2017-03-09 ENCOUNTER — Encounter: Payer: Self-pay | Admitting: Podiatry

## 2017-03-09 ENCOUNTER — Ambulatory Visit: Payer: 59 | Admitting: Pulmonary Disease

## 2017-03-09 ENCOUNTER — Ambulatory Visit (INDEPENDENT_AMBULATORY_CARE_PROVIDER_SITE_OTHER): Payer: 59 | Admitting: Podiatry

## 2017-03-09 VITALS — BP 114/74 | HR 80 | Temp 98.6°F

## 2017-03-09 DIAGNOSIS — Z9889 Other specified postprocedural states: Secondary | ICD-10-CM

## 2017-03-09 NOTE — Progress Notes (Signed)
  Subjective:  Patient ID: Veronica Rivers, female    DOB: [birth year],  MRN: [id number]  51 y.o. female returns for the above complaint. Reports some pain at the incision site.  Denies other complaints  Objective:   Vitals:   03/09/17 1642  BP: 114/74  Pulse: 80  Temp: 98.6 F (37 C)   General AA&O x3. Normal mood and affect.  Vascular Foot warm and well perfused.  Neurologic Gross sensation intact.  Dermatologic Skin incisions healing well without erythema warmth or drainage.  Sutures and staples intact  Orthopedic: Tenderness to palpation noted about the surgical site.    Assessment & Plan:  Patient was evaluated and treated and all questions answered.  Status post left peroneal tendon repair, EPF -Cast removed -Incisions healing well.  Sutures removed from EPF site.  Staples left intact at peroneal site -Short leg cast reapplied -Follow up in 2 weeks with Dr. Milinda Pointer

## 2017-03-23 ENCOUNTER — Encounter: Payer: Self-pay | Admitting: Podiatry

## 2017-03-23 ENCOUNTER — Ambulatory Visit (INDEPENDENT_AMBULATORY_CARE_PROVIDER_SITE_OTHER): Payer: 59 | Admitting: Podiatry

## 2017-03-23 VITALS — BP 121/66 | HR 79 | Temp 96.2°F

## 2017-03-23 DIAGNOSIS — Z9889 Other specified postprocedural states: Secondary | ICD-10-CM

## 2017-03-23 NOTE — Progress Notes (Signed)
She presents today for follow-up of her peroneal tendon repair and her endoscopic plantar fasciotomy on her left foot. Date of surgery was 02/17/2017. She states that she's doing pretty well though she has willing to the left foot.   Objective: Vital signs are stable alert and oriented 3 cast was intact was removed demonstrates mild edema to the surgical foot. Staples are intact margins are well coapted. Sutures were removed as well as staples and remains well coapted. There is no signs of infection. She has swelling to the area but no calf pain and no warmth on palpation.  Assessment: Well-healing surgical foot.  Plan: I'm going to have her start partial weightbearing which is demonstrated to her today we also dispensed a cam boot and a night splint. Follow up with her in 2 weeks

## 2017-04-06 ENCOUNTER — Ambulatory Visit (INDEPENDENT_AMBULATORY_CARE_PROVIDER_SITE_OTHER): Payer: 59 | Admitting: Podiatry

## 2017-04-06 ENCOUNTER — Encounter: Payer: Self-pay | Admitting: Podiatry

## 2017-04-06 DIAGNOSIS — M779 Enthesopathy, unspecified: Secondary | ICD-10-CM

## 2017-04-06 DIAGNOSIS — M722 Plantar fascial fibromatosis: Secondary | ICD-10-CM

## 2017-04-08 NOTE — Progress Notes (Signed)
She presents today for postop visit date of surgery 02/17/2017. Status post peroneal tendon repair left foot and ankle. He states doing really well. She denies fever chills nausea vomiting muscle aches pain shortness of breath or chest pain. States that she still gets some swelling.  Objective: Vital signs are stable she is alert and oriented 3 presents utilizing her cam walker. Moderate edema no erythema cellulitis drainage or odor some mild tenderness on palpation of the surgical site some numbness remaining to her fifth toe. Full range of motion and full muscle strength against resistance.  Assessment: Well-healing ankle and peroneal tendon repair left.  Plan: Encouraged ambulation but for her not to overdo it. I placed her in a Tri-Lock brace for stability and to prevent injury. She will continue utilize this over the next 3-4 weeks and I will follow-up with her at that time.

## 2017-04-20 ENCOUNTER — Ambulatory Visit (INDEPENDENT_AMBULATORY_CARE_PROVIDER_SITE_OTHER): Payer: 59 | Admitting: Podiatry

## 2017-04-20 ENCOUNTER — Telehealth: Payer: Self-pay | Admitting: *Deleted

## 2017-04-20 DIAGNOSIS — R52 Pain, unspecified: Secondary | ICD-10-CM

## 2017-04-20 DIAGNOSIS — R609 Edema, unspecified: Secondary | ICD-10-CM

## 2017-04-20 DIAGNOSIS — I82492 Acute embolism and thrombosis of other specified deep vein of left lower extremity: Secondary | ICD-10-CM

## 2017-04-20 MED ORDER — METHYLPREDNISOLONE 4 MG PO TBPK: ORAL_TABLET | ORAL | 0 refills | Status: DC

## 2017-04-20 NOTE — Telephone Encounter (Signed)
[facility] [place] - [facility] scheduled Venous doppler for 04/21/2017 at 12:00pm to arrive 11:45am. Faxed orders to [facility]. Left message on pt's mobile stating that due the importance of the message I was leaving her appt time for the venous doppler 12/14 2018 at 12:00pm to arrive 11:45am, that this was the testing Dr. Milinda Pointer had discussed with her I also left [facility] contact number and address on the mobile phone. Unable to leave message on alternate home phone message states the subscriber is no longer in service.

## 2017-04-20 NOTE — Telephone Encounter (Signed)
-----   Message from Rip Harbour, [facility] sent at 04/20/2017  2:51 PM EST ----- Regarding: Ultrasound Ultrasound left leg/calf/thigh - evaluate DVT left as soon as possible

## 2017-04-20 NOTE — Progress Notes (Signed)
She presents today for a follow-up of her peroneal tendon repair date of surgery was 02/23/2017.  She denies fever chills nausea vomiting muscle aches and pains states that she has noticed some calf pain recently and a consider amount of swelling to her left lower extremity.  No chest pain shortness of breath.  Objective: Vital signs are stable she is alert and oriented x3 considerable swelling to the left lower extremity.  Tenderness on palpation of the posterior calf with no heat.  Considerable pitting edema.   assessment: Well-healing peroneal tendon cannot rule out DVT well-healing endoscopic fasciotomy.  History nonunion tarsometatarsal joint.  Plan: We sent her for a d-dimer and a ultrasound of the calf.  Should this come back abnormal we will notify her immediately.  She is to continue to wear her ankle brace and compression ankle.

## 2017-04-21 ENCOUNTER — Ambulatory Visit (HOSPITAL_COMMUNITY)
Admission: RE | Admit: 2017-04-21 | Discharge: 2017-04-21 | Disposition: A | Discharge status: Home / Self Care | Payer: 59 | Source: Ambulatory Visit | Attending: Vascular Surgery | Admitting: Vascular Surgery

## 2017-04-21 DIAGNOSIS — R52 Pain, unspecified: Secondary | ICD-10-CM | POA: Diagnosis not present

## 2017-04-21 DIAGNOSIS — M79605 Pain in left leg: Secondary | ICD-10-CM | POA: Diagnosis present

## 2017-04-21 DIAGNOSIS — R609 Edema, unspecified: Secondary | ICD-10-CM | POA: Diagnosis not present

## 2017-04-21 NOTE — Telephone Encounter (Signed)
[facility] staff called states pt is negative for DVT, and pt has left. I called pt to make sur she had been informed that the DVT testing was negative, and pt states the staff at [facility] gave her the results.

## 2017-04-22 LAB — D-DIMER, QUANTITATIVE: D-Dimer, Quant: 0.45 mcg/mL FEU (ref <0.50)

## 2017-05-04 ENCOUNTER — Other Ambulatory Visit: Payer: 59

## 2017-05-11 ENCOUNTER — Ambulatory Visit (INDEPENDENT_AMBULATORY_CARE_PROVIDER_SITE_OTHER): Payer: 59 | Admitting: Podiatry

## 2017-05-11 ENCOUNTER — Encounter: Payer: Self-pay | Admitting: Podiatry

## 2017-05-11 DIAGNOSIS — T148XXA Other injury of unspecified body region, initial encounter: Secondary | ICD-10-CM

## 2017-05-11 DIAGNOSIS — M779 Enthesopathy, unspecified: Secondary | ICD-10-CM

## 2017-05-13 NOTE — Progress Notes (Signed)
She presents today states that she is doing better still has swelling though it has decreased considerably she states that it is sore at times when she does too much walking she is referring to her peroneal tendon repair of her left ankle.  Objective: Vital signs are stable she is alert oriented x3 has good range of motion of the foot and ankle.  Moderately edematous no calf pain.  Good range of motion against resistance.  Assessment: Well-healing surgical foot.  Plan: Follow-up with me in 1 month.  Encouraged her to wear compression garment.

## 2017-05-23 ENCOUNTER — Other Ambulatory Visit: Payer: Self-pay | Admitting: *Deleted

## 2017-05-23 MED ORDER — CARVEDILOL 12.5 MG PO TABS: ORAL_TABLET | ORAL | 1 refills | Status: DC

## 2017-06-05 ENCOUNTER — Telehealth: Payer: Self-pay

## 2017-06-05 NOTE — Telephone Encounter (Signed)
Left message for patient to call back  

## 2017-06-05 NOTE — Telephone Encounter (Signed)
-----   Message from Josue Hector, MD sent at 06/04/2017  9:59 PM EST ----- LDL 141 too high intolerant to zocor in past try zetia 10 mg daily f/u lipid clinic

## 2017-06-13 ENCOUNTER — Ambulatory Visit: Payer: 59 | Admitting: Podiatry

## 2017-06-13 NOTE — Telephone Encounter (Signed)
Sent message through My Chart

## 2017-06-15 ENCOUNTER — Telehealth: Payer: Self-pay

## 2017-06-15 MED ORDER — EZETIMIBE 10 MG PO TABS: 10.0000 mg | ORAL_TABLET | Freq: Every day | ORAL | 6 refills | Status: DC

## 2017-06-15 NOTE — Telephone Encounter (Signed)
-----   Message from Josue Hector, MD sent at 06/04/2017  9:59 PM EST ----- LDL 141 too high intolerant to zocor in past try zetia 10 mg daily f/u lipid clinic

## 2017-06-15 NOTE — Telephone Encounter (Signed)
Patient aware of results. Per Dr. Johnsie Cancel, LDL 141 too high intolerant to zocor in past try zetia 10 mg daily f/u lipid clinic. Patient will try Zetia and will go to lipid clinic on 06/27/17.

## 2017-06-27 ENCOUNTER — Ambulatory Visit: Payer: 59 | Admitting: Pharmacist

## 2017-06-27 NOTE — Progress Notes (Deleted)
Patient ID: [name]                 DOB: 05-20-1965                    MRN: [id number]     HPI: Veronica Rivers is a 52 y.o. female patient referred to lipid clinic by Dr Johnsie Cancel. PMH is significant for HTN, HLD, DM, obesity, atypical chest pain, SVT, and DM.   Started Zetia 2 weeks ago - tolerating ok? prava 20 every other day or Livalo - insurance?  Current Medications: Zetia 10mg  daily Intolerances: simvastatin 40mg  daily, atorvastatin 20mg  daily, rosuvastatin 10mg  daily and 2x per week Risk Factors: DM, obesity LDL goal: 100mg /dL  Diet:   Exercise:   Family History: Mother with HTN and COPD - died at 26, father with lung cancer - died in his 39s  Social History: former smoker for 15 years, quit in 1999. Denies alcohol and illicit drug use.  Labs: 05/30/17: TC 217, TG 118, HDL 52, LDL 141, non-HDL 165 (no therapy)  Past Medical History:  Diagnosis Date  . Asthma   . Bipolar depression ([place])   . Cellulitis   . CHEST PAIN   . DM   . DYSPNEA   . HYPERCHOLESTEROLEMIA   . HYPERLIPIDEMIA   . HYPOTHYROIDISM   . Neuropathy   . SUPRAVENTRICULAR TACHYCARDIA     Current Outpatient Medications on File Prior to Visit  Medication Sig Dispense Refill  . albuterol (PROVENTIL HFA;VENTOLIN HFA) 108 (90 BASE) MCG/ACT inhaler Inhale 1-2 puffs into the lungs every 4 (four) hours as needed for wheezing or shortness of breath.     [name] BREO ELLIPTA 200-25 MCG/INH AEPB Inhale 1 puff into the lungs daily.  1  . carvedilol (COREG) 12.5 MG tablet TAKE 1 TABLET (12.5 MG TOTAL) BY MOUTH 2 (TWO) TIMES DAILY WITH A MEAL. 180 tablet 1  . cephALEXin (KEFLEX) 500 MG capsule Take 1 capsule (500 mg total) by mouth 3 (three) times daily. 30 capsule 0  . ezetimibe (ZETIA) 10 MG tablet Take 1 tablet (10 mg total) by mouth daily. 30 tablet 6  . FIBER SELECT GUMMIES PO Take 3 tablets by mouth 3 (three) times a week.    . fluticasone (FLONASE) 50 MCG/ACT nasal spray Place 1 spray into both nostrils daily.      [name] gabapentin (NEURONTIN) 300 MG capsule Take 1 capsule by mouth 2 (two) times daily.   0  . JARDIANCE 10 MG TABS tablet Take 10 mg by mouth daily.  11  . lamoTRIgine (LAMICTAL) 200 MG tablet Take 300 mg by mouth daily.    [name] LATUDA 20 MG TABS tablet Take 20 mg by mouth daily. with food  1  . LATUDA 80 MG TABS tablet Take 80 mg by mouth daily. with food  3  . levocetirizine (XYZAL) 5 MG tablet Take 5 mg by mouth every evening.    [name] levothyroxine (SYNTHROID, LEVOTHROID) 100 MCG tablet Take 100 mcg by mouth at bedtime.     [name] lisinopril (PRINIVIL,ZESTRIL) 2.5 MG tablet Take 2.5 mg by mouth daily.  0  . LORazepam (ATIVAN) 0.5 MG tablet Take 0.5 mg by mouth 4 (four) times daily as needed for anxiety.    . metFORMIN (GLUCOPHAGE-XR) 500 MG 24 hr tablet TAKE 2 TABLETS BY MOUTH EVERY MORNING AND 2 TABLET AT NIGHT  1  . methylPREDNISolone (MEDROL DOSEPAK) 4 MG TBPK tablet 6 day dose pack - take as directed  21 tablet 0  . montelukast (SINGULAIR) 10 MG tablet Take 10 mg by mouth at bedtime.    . Nutritional Supplements (ESTROVEN WEIGHT MANAGEMENT) CAPS Take 1 capsule by mouth every evening.     . Oxycodone HCl 10 MG TABS Take 10 mg by mouth every 6 (six) hours as needed for pain. for pain  0  . oxyCODONE-acetaminophen (PERCOCET) 10-325 MG tablet Take 1 tablet by mouth every 8 (eight) hours as needed for pain. 30 tablet 0  . oxyCODONE-acetaminophen (PERCOCET) 10-325 MG tablet Take 1 tablet by mouth every 4 (four) hours as needed for pain. 30 tablet 0  . promethazine (PHENERGAN) 25 MG tablet Take 1 tablet (25 mg total) by mouth every 8 (eight) hours as needed. 20 tablet 0  . QUEtiapine (SEROQUEL) 400 MG tablet Take 400 mg by mouth at bedtime.    [name] SYNTHROID 112 MCG tablet Take 112 mcg by mouth every morning.  0   No current facility-administered medications on file prior to visit.     Allergies  Allergen Reactions  . Meloxicam     Pt stated, "Got Burning, Acid Reflux with medicine"  . Zocor  [Simvastatin]     Caused pain and hot flashes  . Sulfa Drugs Cross Reactors Rash    Assessment/Plan:  1. Hyperlipidemia -

## 2017-07-27 IMAGING — CR DG CHEST 2V
2 series · 2 of 2 positions shown · non-contrast
Comparison: 05/19/2015.

CLINICAL DATA: Mid chest congestion for the past month. Asthma
exacerbation.

EXAM:
CHEST  2 VIEW

[w chest pa]
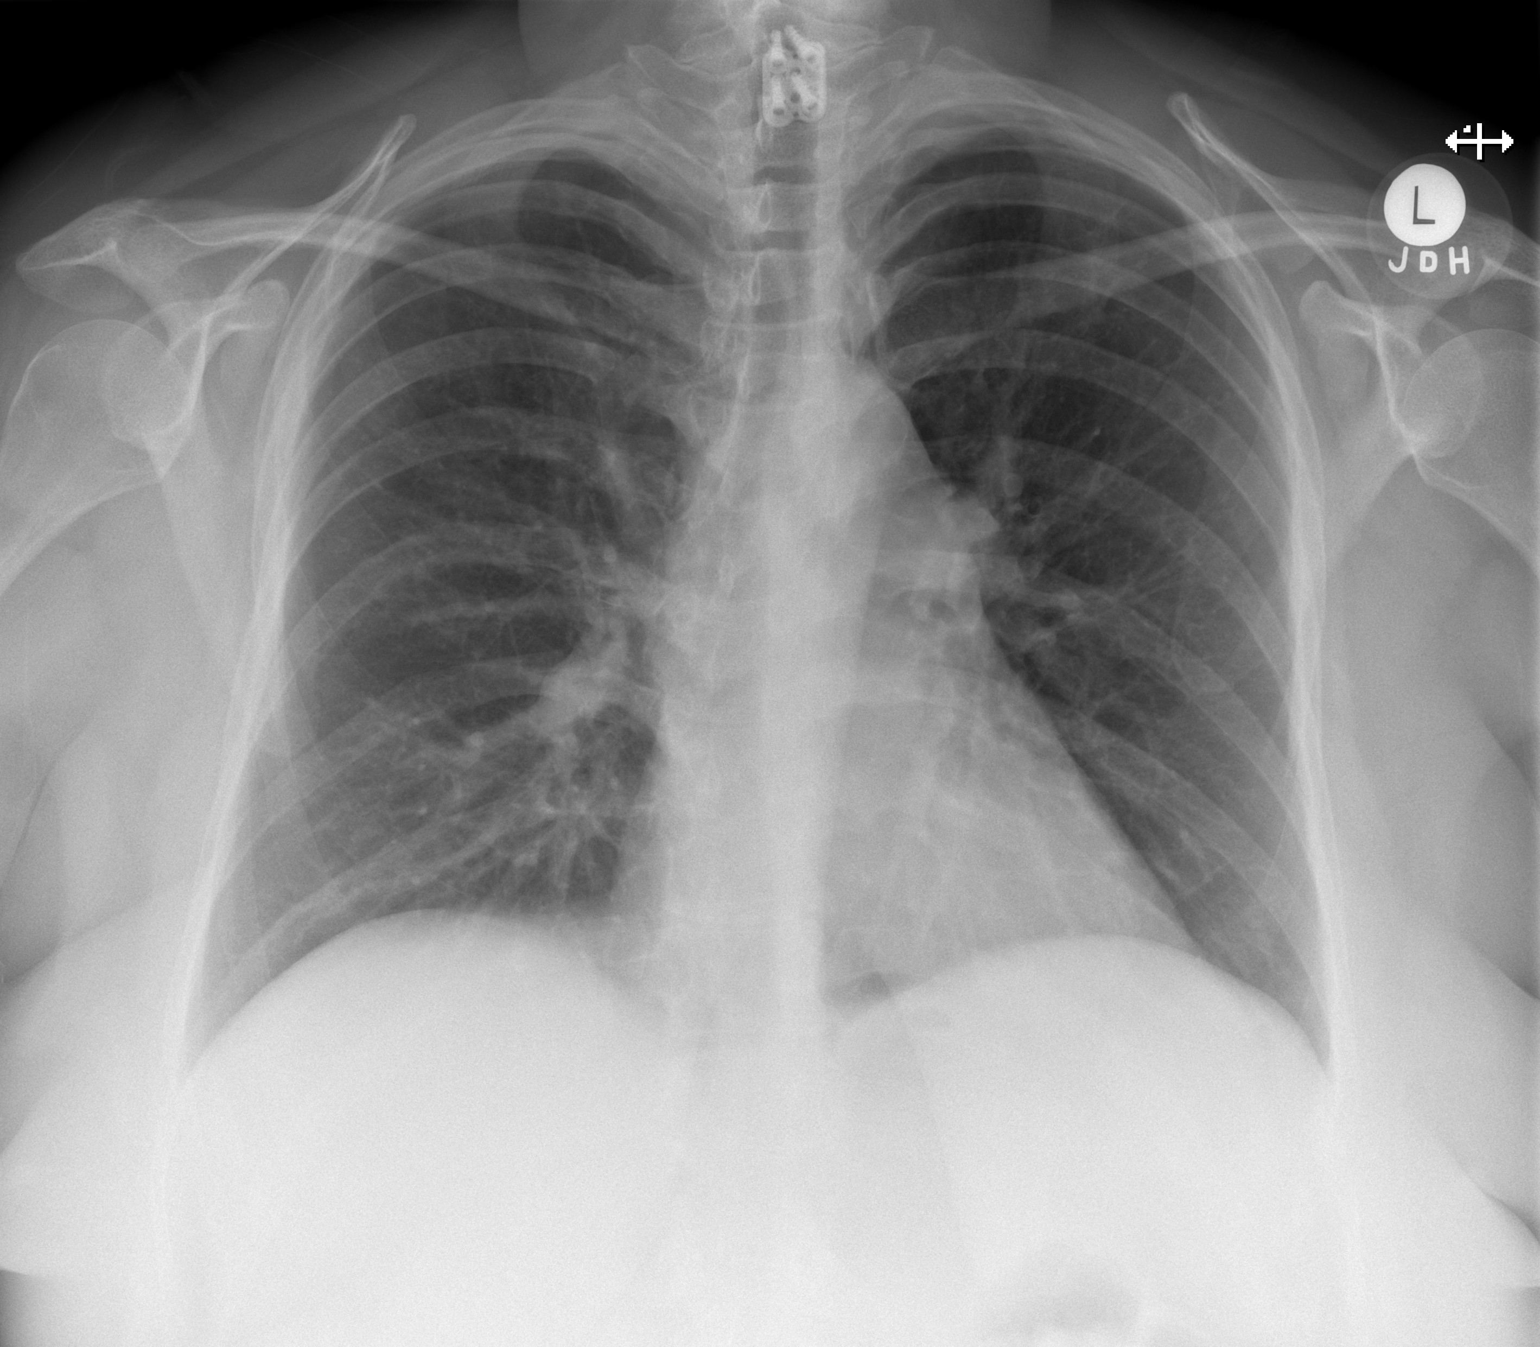

[w chest lat]
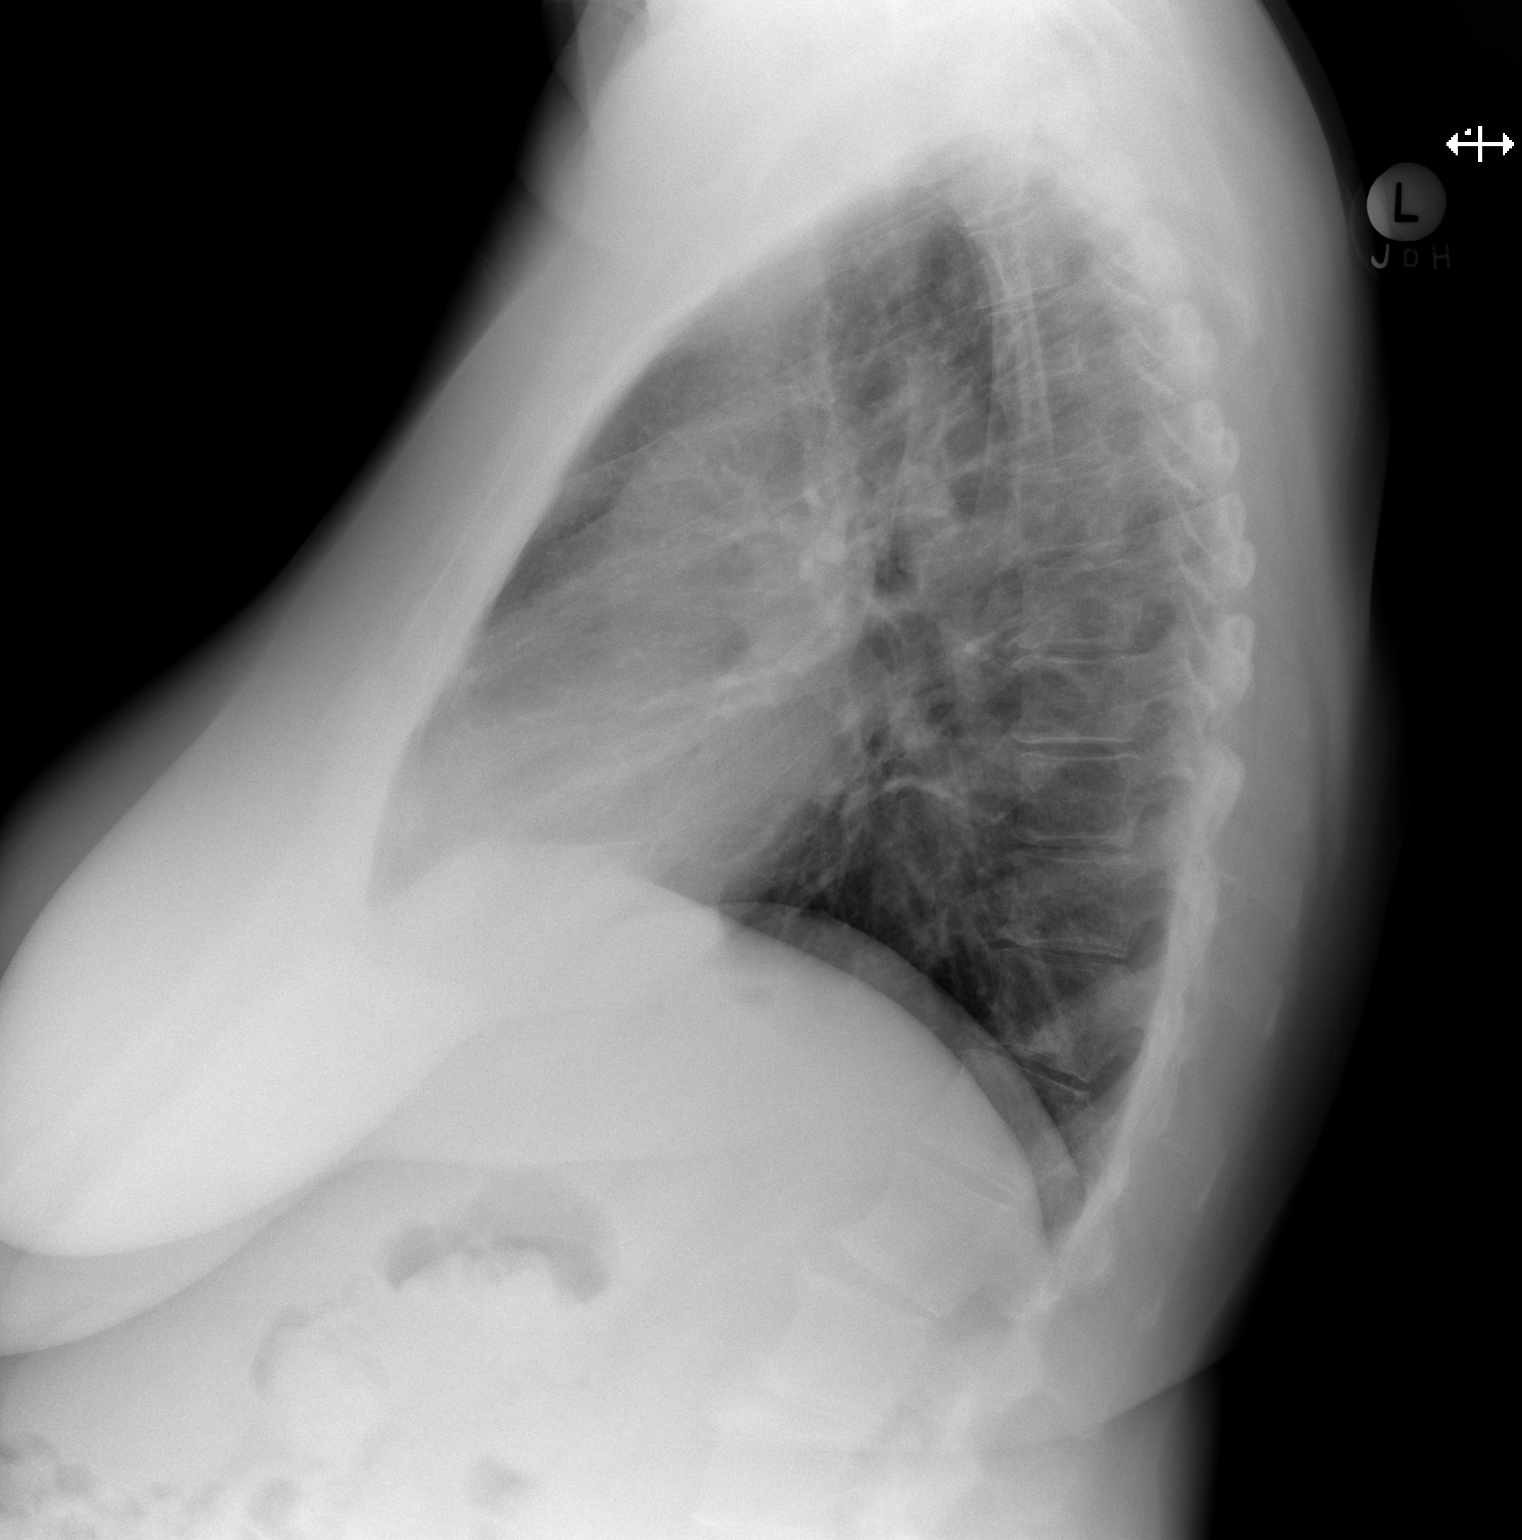

[2 of 2 positions shown; findings below may reference images not displayed]

FINDINGS: Normal sized heart. Clear lungs. Cervical spine fixation hardware.
Mild thoracic spine degenerative changes.
IMPRESSION: No acute abnormality.

## 2017-08-01 ENCOUNTER — Ambulatory Visit: Payer: 59 | Admitting: Podiatry

## 2017-08-08 ENCOUNTER — Ambulatory Visit: Payer: 59 | Admitting: Podiatry

## 2017-08-08 DIAGNOSIS — M779 Enthesopathy, unspecified: Secondary | ICD-10-CM

## 2017-08-09 NOTE — Progress Notes (Signed)
She presents today states that she still having problems with the lateral aspect of her foot.  States that the peroneal tendon is still painful when we repaired it back in October.  She states that she cannot go for very long at a time before it starts to hurt.  Objective: Vital signs are stable she is alert and oriented x3.  Pulses are palpable.  She has pain on palpation along the incision site and on inversion.  There appears to be scar tissue that is adhesed down to the tendon.  Assessment: Scar tissue peroneal tendon repair October 2018.  Plan: At this point I injected Kenalog 20 mg of Kenalog 5 mg Marcaine after sterile Betadine skin prep sub-lesional he along the course of the tendon itself.  Obviously it was very thick and dense as the needle course through the tissue.  This should help break up the scar tissue and alleviate her symptoms if it does not formal physical therapy will be necessary which has not been performed.

## 2017-09-05 ENCOUNTER — Encounter: Payer: Self-pay | Admitting: Podiatry

## 2017-09-05 ENCOUNTER — Ambulatory Visit (INDEPENDENT_AMBULATORY_CARE_PROVIDER_SITE_OTHER): Payer: 59 | Admitting: Podiatry

## 2017-09-05 DIAGNOSIS — M7750 Other enthesopathy of unspecified foot: Secondary | ICD-10-CM

## 2017-09-05 DIAGNOSIS — M779 Enthesopathy, unspecified: Secondary | ICD-10-CM

## 2017-09-05 NOTE — Progress Notes (Signed)
She presents today for follow-up of the repair of her peroneal tendon.  She states that is about 60% better than it was prior to surgery.  States that after the last injection he was doing very well.  She states that he feels like breaking up scar tissue and I am progressively getting better daily.  I do not want to have to go to formal physical therapy.  Objective: Vital signs are stable she is alert and oriented x3 there is no erythematous mild edema no cellulitis drainage at her incision site appears to be healing quite nicely there is considerable release of scar tissue from previous injection however she still has some remaining proximally.  Assessment: Slowly resolving peroneal tendon repair.  Plan: I reinjected more proximally in the scar line today with 2 mg dexamethasone and local anesthetic.  I will follow-up with her in 1 to 2 months.  She will continue at home physical therapy.

## 2017-10-05 ENCOUNTER — Ambulatory Visit (INDEPENDENT_AMBULATORY_CARE_PROVIDER_SITE_OTHER): Payer: 59 | Admitting: Podiatry

## 2017-10-05 ENCOUNTER — Encounter: Payer: Self-pay | Admitting: Podiatry

## 2017-10-05 DIAGNOSIS — M779 Enthesopathy, unspecified: Secondary | ICD-10-CM | POA: Diagnosis not present

## 2017-10-05 NOTE — Progress Notes (Signed)
She presents today for follow-up of a tear repair to the peroneal tendons of the left foot.  She states that seems to be doing a lot better she is walking 3 miles a day she still has considerable swelling and pain by the end of the [place].  She states that she will be walking a 5K this coming weekend.  Objective: Vital signs are stable she is alert and oriented x3 much decrease in the left fibular malleolar region.  She has mild tenderness on palpation of the [place] longus tendon.  Assessment: Well-healing peroneal tendon tear slow but is starting to recover.  Plan: I am going to recommend she continue her active lifestyle and I will follow-up with her in the near future.

## 2017-10-27 ENCOUNTER — Other Ambulatory Visit: Payer: Self-pay | Admitting: Cardiovascular Disease

## 2017-10-31 ENCOUNTER — Ambulatory Visit: Payer: 59 | Admitting: Podiatry

## 2017-11-01 ENCOUNTER — Emergency Department (HOSPITAL_COMMUNITY): Payer: 59

## 2017-11-01 ENCOUNTER — Encounter (HOSPITAL_COMMUNITY): Payer: Self-pay

## 2017-11-01 ENCOUNTER — Emergency Department (HOSPITAL_COMMUNITY)
Admission: EM | Admit: 2017-11-01 | Discharge: 2017-11-01 | Disposition: A | Discharge status: Home / Self Care | Payer: 59 | Attending: Emergency Medicine | Admitting: Emergency Medicine

## 2017-11-01 ENCOUNTER — Other Ambulatory Visit: Payer: Self-pay

## 2017-11-01 DIAGNOSIS — M546 Pain in thoracic spine: Secondary | ICD-10-CM | POA: Insufficient documentation

## 2017-11-01 DIAGNOSIS — J45909 Unspecified asthma, uncomplicated: Secondary | ICD-10-CM | POA: Insufficient documentation

## 2017-11-01 DIAGNOSIS — Z79899 Other long term (current) drug therapy: Secondary | ICD-10-CM | POA: Diagnosis not present

## 2017-11-01 DIAGNOSIS — E039 Hypothyroidism, unspecified: Secondary | ICD-10-CM | POA: Insufficient documentation

## 2017-11-01 DIAGNOSIS — R0602 Shortness of breath: Secondary | ICD-10-CM

## 2017-11-01 DIAGNOSIS — I10 Essential (primary) hypertension: Secondary | ICD-10-CM | POA: Insufficient documentation

## 2017-11-01 DIAGNOSIS — E1140 Type 2 diabetes mellitus with diabetic neuropathy, unspecified: Secondary | ICD-10-CM | POA: Insufficient documentation

## 2017-11-01 DIAGNOSIS — Z87891 Personal history of nicotine dependence: Secondary | ICD-10-CM | POA: Diagnosis not present

## 2017-11-01 DIAGNOSIS — Z7984 Long term (current) use of oral hypoglycemic drugs: Secondary | ICD-10-CM | POA: Diagnosis not present

## 2017-11-01 DIAGNOSIS — R05 Cough: Secondary | ICD-10-CM | POA: Insufficient documentation

## 2017-11-01 LAB — COMPREHENSIVE METABOLIC PANEL
ALBUMIN: 3.9 g/dL (ref 3.5–5.0)
ALK PHOS: 76 U/L (ref 38–126)
ALT: 17 U/L (ref 0–44)
AST: 16 U/L (ref 15–41)
Anion gap: 6 (ref 5–15)
BILIRUBIN TOTAL: 0.5 mg/dL (ref 0.3–1.2)
BUN: 13 mg/dL (ref 6–20)
CO2: 33 mmol/L — AB (ref 22–32)
CREATININE: 1.01 mg/dL — AB (ref 0.44–1.00)
Calcium: 9.6 mg/dL (ref 8.9–10.3)
Chloride: 104 mmol/L (ref 98–111)
GFR calc non Af Amer: >60 mL/min (ref >60)
GLUCOSE: 206 mg/dL — AB (ref 70–99)
Potassium: 3.8 mmol/L (ref 3.5–5.1)
SODIUM: 143 mmol/L (ref 135–145)
TOTAL PROTEIN: 7.1 g/dL (ref 6.5–8.1)

## 2017-11-01 LAB — CBC
HEMATOCRIT: 46.5 % — AB (ref 36.0–46.0)
HEMOGLOBIN: 15 g/dL (ref 12.0–15.0)
MCH: 31.8 pg (ref 26.0–34.0)
MCHC: 32.3 g/dL (ref 30.0–36.0)
MCV: 98.7 fL (ref 78.0–100.0)
Platelets: 270 10*3/uL (ref 150–400)
RBC: 4.71 MIL/uL (ref 3.87–5.11)
RDW: 13.6 % (ref 11.5–15.5)
WBC: 6.8 10*3/uL (ref 4.0–10.5)

## 2017-11-01 LAB — I-STAT TROPONIN, ED: Troponin i, poc: 0 ng/mL (ref 0.00–0.08)

## 2017-11-01 MED ORDER — IOPAMIDOL (ISOVUE-370) INJECTION 76%
INTRAVENOUS | Status: AC
  Filled 2017-11-01: qty 100

## 2017-11-01 MED ORDER — METHOCARBAMOL 500 MG PO TABS: 500.0000 mg | ORAL_TABLET | Freq: Every evening | ORAL | 0 refills | Status: DC | PRN

## 2017-11-01 MED ORDER — IOPAMIDOL (ISOVUE-370) INJECTION 76%
100.0000 mL | Freq: Once | INTRAVENOUS | Status: AC | PRN
  Administered 2017-11-01: 75 mL via INTRAVENOUS

## 2017-11-01 MED ORDER — ALBUTEROL SULFATE (2.5 MG/3ML) 0.083% IN NEBU
5.0000 mg | INHALATION_SOLUTION | Freq: Once | RESPIRATORY_TRACT | Status: DC
  Filled 2017-11-01: qty 6

## 2017-11-01 MED ORDER — KETOROLAC TROMETHAMINE 15 MG/ML IJ SOLN
15.0000 mg | Freq: Once | INTRAMUSCULAR | Status: AC
  Administered 2017-11-01: 15 mg via INTRAVENOUS
  Filled 2017-11-01: qty 1

## 2017-11-01 NOTE — ED Triage Notes (Signed)
Patient reports that she began having SOB/whezing yesterday. Patient has been using her Albuterol inhaler with no relief. Patient states she called her PCP today and was told to come to the ED. Patient also c/o upper back pain that started 3 days ago.

## 2017-11-01 NOTE — ED Provider Notes (Signed)
[facility] DEPT Provider Note   CSN: [id number] Arrival date & time: 11/01/17  8921     History   Chief Complaint Chief Complaint  Patient presents with  . Asthma  . Back Pain  . Shortness of Breath    HPI Veronica Rivers is a 52 y.o. female presenting for evaluation of shortness of breath and back pain.   Pt states she developed back pain 3 days ago. She thought it was MSK related, it mildly improved with heat. Pain is between her shoulder blades. Yesterday she developed shortness of breath. Pt states this is partially because it hurts to take a deep breath. She had associated wheezing, which improved with her inhaler. Pt states she is still SOB. she developed a mild, nonproductive cough this morning. Pain is worse with deep inspiration and exertion. She denies history of similar. She denies leg pain or swelling. She denies recent surgeries, travel, immobilization, hormone use, h/o previous dvt/pe, or h/o CA. She denies anterior CP. PMH of asthma, DM, hypothyroid, SVT, bipolar. No new or recent changes in medications.  Patient denies fevers, chills, sore throat, nausea, vomiting, abdominal pain, abnormal urination, or abnormal bowel movements.  HPI  Past Medical History:  Diagnosis Date  . Asthma   . Bipolar depression ([place])   . Cellulitis   . CHEST PAIN   . DM   . DYSPNEA   . HYPERCHOLESTEROLEMIA   . HYPERLIPIDEMIA   . HYPOTHYROIDISM   . Neuropathy   . SUPRAVENTRICULAR TACHYCARDIA     Patient Active Problem List   Diagnosis Date Noted  . Cellulitis and abscess of leg 08/14/2014  . Diabetic neuropathy ([place]) 08/14/2014  . Bipolar depression ([place]) 08/14/2014  . Hypothyroidism 04/20/2009  . Type 2 diabetes mellitus ([place]) 04/20/2009  . Elevated lipids 04/20/2009  . Essential hypertension 04/20/2009  . SUPRAVENTRICULAR TACHYCARDIA 04/20/2009    Past Surgical History:  Procedure Laterality Date  . CESAREAN SECTION    . FOOT SURGERY    .  NASAL SEPTUM SURGERY    . NECK SURGERY    . TONSILLECTOMY    . TUBAL LIGATION       OB History   None      Home Medications    Prior to Admission medications   Medication Sig Start Date End Date Taking? Authorizing Provider  albuterol (PROVENTIL HFA;VENTOLIN HFA) 108 (90 BASE) MCG/ACT inhaler Inhale 1-2 puffs into the lungs every 4 (four) hours as needed for wheezing or shortness of breath.    Yes [provider]  ARIPiprazole (ABILIFY) 10 MG tablet Take 10 mg by mouth daily. 09/05/17  Yes [provider]  BREO ELLIPTA 200-25 MCG/INH AEPB Inhale 1 puff into the lungs daily. 09/24/15  Yes [provider]  carvedilol (COREG) 12.5 MG tablet TAKE 1 TABLET TWICE A DAY WITH MEALS. Please keep upcoming appt for future refills. Thank you. 10/27/17  Yes Josue Hector, MD  clonazePAM (KLONOPIN) 1 MG tablet Take 0.5-1 mg by mouth See admin instructions. Pt takes one half every morning PRN anxiety and one at bedtime every day 10/24/17  Yes [provider]  fluticasone (FLONASE) 50 MCG/ACT nasal spray Place 1 spray into both nostrils daily.  06/13/16  Yes [provider]  gabapentin (NEURONTIN) 300 MG capsule Take 1 capsule by mouth 2 (two) times daily.  04/21/14  Yes [provider]  hydrocortisone cream 0.5 % Apply 1 application topically 2 (two) times daily.   Yes [provider]  JARDIANCE 25 MG TABS tablet Take 25 mg by mouth daily.  09/27/16  Yes [provider]  lamoTRIgine (LAMICTAL) 200 MG tablet Take 300 mg by mouth daily.   Yes [provider]  levocetirizine (XYZAL) 5 MG tablet Take 5 mg by mouth every evening.   Yes [provider]  levothyroxine (SYNTHROID, LEVOTHROID) 137 MCG tablet Take 137 mcg by mouth at bedtime.    Yes [provider]  lisinopril (PRINIVIL,ZESTRIL) 2.5 MG tablet Take 2.5 mg by mouth daily. 04/04/16  Yes [provider]  metFORMIN (GLUCOPHAGE-XR) 500 MG 24 hr  tablet TAKE 2 TABLETS BY MOUTH EVERY MORNING AND 2 TABLET AT NIGHT 04/29/16  Yes [provider]  montelukast (SINGULAIR) 10 MG tablet Take 10 mg by mouth at bedtime.   Yes [provider]  Nutritional Supplements (ESTROVEN WEIGHT MANAGEMENT) CAPS Take 1 capsule by mouth every evening.    Yes [provider]  ONE TOUCH ULTRA TEST test strip Inject 1 strip into the skin daily. 10/11/17  Yes [provider]  Oxycodone HCl 10 MG TABS Take 10 mg by mouth every 6 (six) hours as needed for pain. for pain 10/22/16  Yes [provider]  QUEtiapine (SEROQUEL) 400 MG tablet Take 400 mg by mouth at bedtime.   Yes [provider]  SPIRIVA RESPIMAT 1.25 MCG/ACT AERS Take 2 puffs by mouth every morning. 09/18/17  Yes [provider]  cephALEXin (KEFLEX) 500 MG capsule Take 1 capsule (500 mg total) by mouth 3 (three) times daily. Patient not taking: Reported on 11/01/2017 02/15/17   Tyson Dense T, DPM  ezetimibe (ZETIA) 10 MG tablet Take 1 tablet (10 mg total) by mouth daily. Patient not taking: Reported on 11/01/2017 06/15/17   Josue Hector, MD  methocarbamol (ROBAXIN) 500 MG tablet Take 1 tablet (500 mg total) by mouth at bedtime as needed for muscle spasms. 11/01/17   Brightyn Mozer, PA-C  methylPREDNISolone (MEDROL DOSEPAK) 4 MG TBPK tablet 6 day dose pack - take as directed Patient not taking: Reported on 11/01/2017 04/20/17   Tyson Dense T, DPM  oxyCODONE-acetaminophen (PERCOCET) 10-325 MG tablet Take 1 tablet by mouth every 8 (eight) hours as needed for pain. Patient not taking: Reported on 11/01/2017 01/31/17   Garrel Ridgel, DPM  oxyCODONE-acetaminophen (PERCOCET) 10-325 MG tablet Take 1 tablet by mouth every 4 (four) hours as needed for pain. Patient not taking: Reported on 11/01/2017 02/15/17   Garrel Ridgel, DPM  promethazine (PHENERGAN) 25 MG tablet Take 1 tablet (25 mg total) by mouth every 8 (eight) hours as needed. Patient not taking: Reported  on 11/01/2017 02/15/17   Garrel Ridgel, DPM    Family History Family History  Problem Relation Age of Onset  . Hypertension Mother   . COPD Mother        Died age 50  . Cancer Father        Lung, died in his 81s    Social History Social History   Tobacco Use  . Smoking status: Former Smoker    Years: 15.00    Types: Cigarettes    Last attempt to quit: 11/23/1997    Years since quitting: 19.9  . Smokeless tobacco: Never Used  Substance Use Topics  . Alcohol use: No  . Drug use: No     Allergies   Meloxicam; Zocor [simvastatin]; and Sulfa drugs cross reactors   Review of Systems Review of Systems  Respiratory: Positive for cough, shortness of breath  and wheezing (improved).   Musculoskeletal: Positive for back pain.  All other systems reviewed and are negative.    Physical Exam Updated Vital Signs BP 106/69   Pulse 71   Temp (!) 97.5 F (36.4 C)   Resp 15   Ht 5\' 9"  (1.753 m)   Wt 106.6 kg (235 lb)   SpO2 90%   BMI 34.70 kg/m   Physical Exam  Constitutional: She is oriented to person, place, and time. She appears well-developed and well-nourished. No distress.  Appears in no distress  HENT:  Head: Normocephalic and atraumatic.  Eyes: Pupils are equal, round, and reactive to light. Conjunctivae and EOM are normal.  Neck: Normal range of motion. Neck supple.  Cardiovascular: Normal rate, regular rhythm and intact distal pulses.  Pulmonary/Chest: Effort normal and breath sounds normal. No respiratory distress. She has no wheezes. She exhibits tenderness.  Scattered intermittent wheeze. Speaking in full sentences. TTP of anterior chest wall bilaterally  Abdominal: Soft. She exhibits no distension and no mass. There is no tenderness. There is no guarding.  Musculoskeletal: Normal range of motion. She exhibits tenderness.  No leg pain or swelling. TTP of thoracic back (midline and bilateral musculature). No erythema or warmth. No pain with movement of the upper  extremities. Radial and pedal pulses intact bilaterally.   Neurological: She is alert and oriented to person, place, and time. No sensory deficit.  Skin: Skin is warm and dry. Capillary refill takes less than 2 seconds.  Psychiatric: She has a normal mood and affect.  Nursing note and vitals reviewed.    ED Treatments / Results  Labs (all labs ordered are listed, but only abnormal results are displayed) Labs Reviewed  CBC - Abnormal; Notable for the following components:      Result Value   HCT 46.5 (*)    All other components within normal limits  COMPREHENSIVE METABOLIC PANEL - Abnormal; Notable for the following components:   CO2 33 (*)    Glucose, Bld 206 (*)    Creatinine, Ser 1.01 (*)    All other components within normal limits  I-STAT TROPONIN, ED    EKG EKG Interpretation  Date/Time:  Wednesday November 01 2017 08:52:02 EDT Ventricular Rate:  80 PR Interval:    QRS Duration: 93 QT Interval:  419 QTC Calculation: 484 R Axis:   50 Text Interpretation:  Sinus rhythm Nonspecific T wave abnormality No significant change since last tracing Confirmed by Lajean Saver [phone]) on 11/01/2017 11:36:13 AM   Radiology Dg Chest 2 View  Result Date: 11/01/2017 CLINICAL DATA:  Nonsmoker.  Shortness of breath. EXAM: CHEST - 2 VIEW COMPARISON:  11/22/2016. FINDINGS: Interval increase in cardiac size. Mild vascular congestion without consolidation or edema. No acute bony abnormality. Mild thoracic kyphosis. Prior cervical fusion. IMPRESSION: Cardiomegaly, with increased heart size since 2018. Mild vascular congestion. No frank edema or consolidation. Electronically Signed   By: Staci Righter M.D.   On: 11/01/2017 09:54   Ct Angio Chest Pe W/cm &/or Wo Cm  Result Date: 11/01/2017 CLINICAL DATA:  Shortness of breath and wheezing beginning yesterday. Upper back pain. EXAM: CT ANGIOGRAPHY CHEST WITH CONTRAST TECHNIQUE: Multidetector CT imaging of the chest was performed using the standard  protocol during bolus administration of intravenous contrast. Multiplanar CT image reconstructions and MIPs were obtained to evaluate the vascular anatomy. CONTRAST:  42mL ISOVUE-370 IOPAMIDOL (ISOVUE-370) INJECTION 76% COMPARISON:  None. FINDINGS: Cardiovascular: Image quality is degraded by respiratory motion, limiting evaluation of subsegmental pulmonary arteries.  Otherwise, no pulmonary embolus. Pulmonary arteries and heart are enlarged. No pericardial effusion. Mediastinum/Nodes: Mediastinal lymph nodes are not enlarged by CT size criteria. No hilar or axillary adenopathy. Esophagus is grossly unremarkable. Lungs/Pleura: Centrilobular emphysema with bullous changes at the apices. Image quality is degraded by respiratory motion. Lungs are clear. No pleural fluid. Airway is unremarkable. Upper Abdomen: Visualized portions of the liver, adrenal glands, kidneys, spleen, pancreas and stomach are grossly unremarkable. No upper abdominal adenopathy. Musculoskeletal: Degenerative changes in the spine. No worrisome lytic or sclerotic lesions. Review of the MIP images confirms the above findings. IMPRESSION: 1. Respiratory motion degrades image quality, limiting evaluation of the subsegmental pulmonary arteries. Otherwise, no pulmonary embolus. 2. Enlarged pulmonary arteries, indicative of pulmonary arterial hypertension. 3.  Emphysema (ICD10-J43.9). Electronically Signed   By: Lorin Picket M.D.   On: 11/01/2017 11:29    Procedures Procedures (including critical care time)  Medications Ordered in ED Medications  iopamidol (ISOVUE-370) 76 % injection (has no administration in time range)  ketorolac (TORADOL) 15 MG/ML injection 15 mg (15 mg Intravenous Given 11/01/17 0918)  iopamidol (ISOVUE-370) 76 % injection 100 mL (75 mLs Intravenous Contrast Given 11/01/17 1019)     Initial Impression / Assessment and Plan / ED Course  I have reviewed the triage vital signs and the nursing notes.  Pertinent labs &  imaging results that were available during my care of the patient were reviewed by me and considered in my medical decision making (see chart for details).     Patient presented for evaluation of shortness of breath and back pain.  Physical exam shows patient who is afebrile not tachycardic.  She appears nontoxic.  Sats stable mild, scattered, intermittent wheezes.  No obvious respiratory distress.  Will obtain labs, chest x-ray, and CTA to rule out PE.  EKG and trop to assess heart.  Toradol for pain.  Labs reassuring, no leukocytosis.  Creatinine stable.  Glucose mildly elevated, appears baseline for patient.  Troponin negative.  EKG without significant changes from prior, no STEMI.  Chest x-ray reviewed and interpreted by me, mild cardiomegaly without pneumonia, pneumothorax, or effusions.  CTA without obvious PE.  No other acute findings noted on CTA.  On reassessment, patient reports pain is improved.  Thus, she reports feeling less short of breath.  As pain is reproducible on exam, and patient with reassuring work-up, doubt ACS, PE, infection, dissection.  Discussed with patient.  Discussed treatment for possible muscle strain.  Follow-up with PCP closely.  At this time, patient appears safe for discharge.  Return precautions given.  Patient states she understands and agrees to plan.  Final Clinical Impressions(s) / ED Diagnoses   Final diagnoses:  Acute bilateral thoracic back pain  Shortness of breath    ED Discharge Orders        Ordered    methocarbamol (ROBAXIN) 500 MG tablet  At bedtime PRN     11/01/17 1143       Janera Peugh, PA-C 11/01/17 1157    Lajean Saver, MD 11/01/17 1557

## 2017-11-01 NOTE — Discharge Instructions (Signed)
Take tylenol 3 times a day. Use Robaxin as needed for muscle stiffness or soreness. Have caution, as this may make you tired or groggy. Do not drive or operate heavy machinery while taking this medication.  Use muscle creams (bengay, icy hot, salonpas) as needed for pain.  Follow up with your primary care doctor if pain is not improving with this treatment.  Return to the ER if you develop high fevers, difficulty breathing, or any new or concerning symptoms.

## 2017-11-21 ENCOUNTER — Ambulatory Visit: Payer: 59 | Admitting: Podiatry

## 2017-11-23 NOTE — Progress Notes (Signed)
Patient ID: Veronica Rivers, female   DOB: [birth year], 52 y.o.   MRN: [id number]   Veronica Rivers is seen today for f.u of HTN, elevated lipids atypicdal chest pain history of SVT. She has been doing well. Some vetigo on antivert . She eats too many carbs and we discussed her diet and weight loss at length. She is not having recurrent palpitations or SSCP. She has been compliant with her meds.   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 R/O CXR and CTA normal  No PE mild CE emphysema Seen by pulmonary Dr Elsworth Soho 11/30/17 and PFTls ordered    ROS: Denies fever, malais, weight loss, blurry vision, decreased visual acuity, cough, sputum, SOB, hemoptysis, pleuritic pain, palpitaitons, heartburn, abdominal pain, melena, lower extremity edema, claudication, or rash.  All other systems reviewed and negative  General: BP 122/82   Pulse 75   Ht 5\' 9"  (1.753 m)   Wt 237 lb (107.5 kg)   SpO2 99%   BMI 35.00 kg/m  Affect appropriate Obese white female  HEENT: normal Neck supple with no adenopathy JVP normal no bruits no thyromegaly Lungs clear with no wheezing and good diaphragmatic motion Heart:  N8/G9 mid systolic click SEM murmur, no rub, gallop or click PMI normal Abdomen: benighn, BS positve, no tenderness, no AAA no bruit.  No HSM or HJR Distal pulses intact with no bruits No edema Neuro non-focal Skin warm and dry No muscular weakness    Current Outpatient Medications  Medication Sig Dispense Refill  . albuterol (PROVENTIL HFA;VENTOLIN HFA) 108 (90 BASE) MCG/ACT inhaler Inhale 1-2 puffs into the lungs every 4 (four) hours as needed for wheezing or shortness of breath.     . ARIPiprazole (ABILIFY) 10 MG tablet Take 10 mg by mouth daily.    [name] BREO ELLIPTA 200-25 MCG/INH AEPB Inhale 1 puff into the lungs daily.  1  . carvedilol (COREG) 12.5 MG tablet TAKE 1  TABLET TWICE A DAY WITH MEALS. Please keep upcoming appt for future refills. Thank you. 180 tablet 0  . clonazePAM (KLONOPIN) 1 MG tablet Take 0.5-1 mg by mouth See admin instructions. Pt takes one half every morning PRN anxiety and one at bedtime every day  1  . fluticasone (FLONASE) 50 MCG/ACT nasal spray Place 1 spray into both nostrils daily.     [name] gabapentin (NEURONTIN) 300 MG capsule Take 300 mg by mouth 2 (two) times daily.   0  . hydrocortisone cream 0.5 % Apply 1 application topically 2 (two) times daily.    [name] JARDIANCE 25 MG TABS tablet Take 25 mg by mouth daily.   11  . lamoTRIgine (LAMICTAL) 200 MG tablet Take 300 mg by mouth daily.    [name] levocetirizine (XYZAL) 5 MG tablet Take 5 mg by mouth every evening.    [name] levothyroxine (SYNTHROID, LEVOTHROID) 137 MCG tablet Take 137 mcg by mouth at bedtime.     [name] lisinopril (PRINIVIL,ZESTRIL) 2.5 MG tablet Take 2.5 mg by mouth daily.  0  . metFORMIN (GLUCOPHAGE-XR) 500 MG 24 hr tablet TAKE 2 TABLETS BY MOUTH EVERY MORNING AND 2 TABLET AT NIGHT  1  . montelukast (SINGULAIR) 10 MG tablet Take 10 mg by mouth at bedtime.    . Nutritional Supplements (ESTROVEN WEIGHT MANAGEMENT) CAPS Take 1 capsule by mouth every evening.     . ONE TOUCH  ULTRA TEST test strip Inject 1 strip into the skin daily.  3  . Oxycodone HCl 10 MG TABS Take 10 mg by mouth every 6 (six) hours as needed for pain. for pain  0  . QUEtiapine (SEROQUEL) 400 MG tablet Take 400 mg by mouth at bedtime.    [name] SPIRIVA RESPIMAT 1.25 MCG/ACT AERS Take 2 puffs by mouth every morning.  3   No current facility-administered medications for this visit.     Allergies  Meloxicam; Zocor [simvastatin]; and Sulfa drugs cross reactors  Electrocardiogram:  08/07/13  SR rate 98 poor R wave progression nonspecific ST/T wave changes  10/27/14  SR rate 78 low voltage nonspecific ST changes no change from 2015  10/31/16  SR rate 80 low voltage otherwise normal   Assessment and Plan HTN:  Well controlled.   Continue current medications and low sodium Dash type diet.   DM:  Discussed low carb diet.  Target hemoglobin A1c is 6.5 or less.  Continue current medications. Chol:   Cholesterol is at goal.  Continue current dose of statin and diet Rx.  No myalgias or side effects.  F/U  LFT's in 6 months. Lab Results  Component Value Date   LDLCALC 70 07/02/2014            SVT:  Quiescent infrequent palpitaitons continue beta blocker Can take extra coreg as needed  Chest Pain:  Resolved normal cardiopulmonary stress test 2015  Anxiety:  On abilify seems stable Dyspnea:  Related to emphysema and obesity f/u [facility] with PFT;s Given mid systolic click and ? Cardiomegaly on CXR with dyspnea will Update echo to assess RV/LV function and estimate PA pressure    F/U 1 year  [name]

## 2017-11-30 ENCOUNTER — Ambulatory Visit (INDEPENDENT_AMBULATORY_CARE_PROVIDER_SITE_OTHER): Payer: 59 | Admitting: Pulmonary Disease

## 2017-11-30 ENCOUNTER — Other Ambulatory Visit: Payer: 59

## 2017-11-30 ENCOUNTER — Encounter: Payer: Self-pay | Admitting: Pulmonary Disease

## 2017-11-30 DIAGNOSIS — J432 Centrilobular emphysema: Secondary | ICD-10-CM

## 2017-11-30 NOTE — Assessment & Plan Note (Addendum)
We discussed Finding of emphysema and implications  Check alpha-1 antitrypsin level. Schedule full PFTs -we will also obtain spirometry results from Dr. Donneta Romberg to compare  Based on this, we will discuss changes to medications

## 2017-11-30 NOTE — Patient Instructions (Signed)
We discussed Finding of emphysema and implications  Check alpha-1 antitrypsin level. Schedule full PFTs  Based on this, we will discuss changes to medications

## 2017-11-30 NOTE — Progress Notes (Signed)
Subjective:    Patient ID: Veronica Rivers, female    DOB: [birth year], 52 y.o.   MRN: [id number]  HPI  52 year old remote smoker presents for evaluation of emphysema. She smoked about 1.5 packs/day before she quit in 99 about 25 pack years. She developed sudden onset pain in her back between her shoulder blades on 6/26 and had an emergency room visit.  CT angiogram was obtained which did not show any evidence of pulmonary embolus, but showed enlarged pulmonary arteries and changes of emphysema.  I personally reviewed the images and was able to appreciate bullous changes in both episodes especially on the left. Since then she reports that her shortness of breath has improved but 60% and her pain has improved by about 80%.  She still reports dyspnea on routine exertion including on bending over.  Prior to this episode she was able to walk about 2 miles.  She would take albuterol MDI prior to exertion. She reports a diagnosis of asthma in her 85s triggered by exercise and weather changes and chest colds.  She was on Symbicort for a while and this was changed to [place] about 2 years ago.  She follows with Dr. Donneta Romberg and underwent detailed allergy testing which was negative.  She does report that there is a cat in the house.  She is also worried about finding of cardiomegaly on imaging, she has an appointment with cardiology tomorrow, prior echo from 2015 was reviewed which shows mild LVH.  Past medical history also includes hypertension, bipolar disorder, hypothyroid and  chronic pain requiring narcotics.  Past surgical history of neck surgery 2013, since surgery 2002, tubal ligation 2006.  She is going back to school to learn billing and coding  Past Medical History:  Diagnosis Date  . Asthma   . Bipolar depression ([place])   . Cellulitis   . CHEST PAIN   . DM   . DYSPNEA   . HYPERCHOLESTEROLEMIA   . HYPERLIPIDEMIA   . HYPOTHYROIDISM   . Neuropathy   . SUPRAVENTRICULAR TACHYCARDIA     Past  Surgical History:  Procedure Laterality Date  . CESAREAN SECTION    . FOOT SURGERY    . NASAL SEPTUM SURGERY    . NECK SURGERY    . TONSILLECTOMY    . TUBAL LIGATION      Allergies  Allergen Reactions  . Meloxicam Other (See Comments)    Pt stated, "Got Burning, Acid Reflux with medicine" Pt should not be on any anti-inflammatories d/t elevated LFTs  . Zocor [Simvastatin] Other (See Comments)    Caused muscle pain  . Sulfa Drugs Cross Reactors Rash   Social History   Socioeconomic History  . Marital status: Married    Spouse name: Tangent  . Number of children: 1  . Years of education: 43  . Highest education level: Not on file  Occupational History  . Occupation: Unemployed  Social Needs  . Financial resource strain: Not on file  . Food insecurity:    Worry: Not on file    Inability: Not on file  . Transportation needs:    Medical: Not on file    Non-medical: Not on file  Tobacco Use  . Smoking status: Former Smoker    Years: 15.00    Types: Cigarettes    Last attempt to quit: 11/23/1997    Years since quitting: 20.0  . Smokeless tobacco: Never Used  Substance and Sexual Activity  . Alcohol use: No  . Drug use:  No  . Sexual activity: Yes    Birth control/protection: Surgical  Lifestyle  . Physical activity:    Days per week: Not on file    Minutes per session: Not on file  . Stress: Not on file  Relationships  . Social connections:    Talks on phone: Not on file    Gets together: Not on file    Attends religious service: Not on file    Active member of club or organization: Not on file    Attends meetings of clubs or organizations: Not on file    Relationship status: Not on file  . Intimate partner violence:    Fear of current or ex partner: Not on file    Emotionally abused: Not on file    Physically abused: Not on file    Forced sexual activity: Not on file  Other Topics Concern  . Not on file  Social History Narrative   Married.  Lives with  husband.  Independent of ADLs and with ambulation.      Family History  Problem Relation Age of Onset  . Hypertension Mother   . COPD Mother        Died age 59  . Cancer Father        Lung, died in his 48s     Review of Systems Constitutional: negative for anorexia, fevers and sweats  Eyes: negative for irritation, redness and visual disturbance  Ears, nose, mouth, throat, and face: negative for earaches, epistaxis, nasal congestion and sore throat  Respiratory: negative for cough,  sputum and wheezing  Cardiovascular: negative for  lower extremity edema, orthopnea, palpitations and syncope  Gastrointestinal: negative for abdominal pain, constipation, diarrhea, melena, nausea and vomiting  Genitourinary:negative for dysuria, frequency and hematuria  Hematologic/lymphatic: negative for bleeding, easy bruising and lymphadenopathy  Musculoskeletal:negative for arthralgias, muscle weakness and stiff joints  Neurological: negative for coordination problems, gait problems, headaches and weakness  Endocrine: negative for diabetic symptoms including polydipsia, polyuria and weight loss     Objective:   Physical Exam   Gen. Pleasant, obese, in no distress, normal affect ENT - no lesions, no post nasal drip, class 2-3 airway Neck: No JVD, no thyromegaly, no carotid bruits Lungs: no use of accessory muscles, no dullness to percussion, decreased without rales or rhonchi  Cardiovascular: Rhythm regular, heart sounds  normal, no murmurs or gallops, no peripheral edema Abdomen: soft and non-tender, no hepatosplenomegaly, BS normal. Musculoskeletal: No deformities, no cyanosis or clubbing Neuro:  alert, non focal, no tremors        Assessment & Plan:

## 2017-12-01 ENCOUNTER — Encounter: Payer: Self-pay | Admitting: Cardiovascular Disease

## 2017-12-01 ENCOUNTER — Ambulatory Visit: Payer: 59 | Admitting: Cardiovascular Disease

## 2017-12-01 VITALS — BP 122/82 | HR 75 | Ht 69.0 in | Wt 237.0 lb

## 2017-12-01 DIAGNOSIS — I10 Essential (primary) hypertension: Secondary | ICD-10-CM | POA: Diagnosis not present

## 2017-12-01 DIAGNOSIS — R0600 Dyspnea, unspecified: Secondary | ICD-10-CM | POA: Diagnosis not present

## 2017-12-01 DIAGNOSIS — R079 Chest pain, unspecified: Secondary | ICD-10-CM | POA: Diagnosis not present

## 2017-12-01 DIAGNOSIS — I471 Supraventricular tachycardia: Secondary | ICD-10-CM

## 2017-12-01 NOTE — Patient Instructions (Addendum)
Medication Instructions:  Your physician recommends that you continue on your current medications as directed. Please refer to the Current Medication list given to you today.  Labwork: NONE  Testing/Procedures: Your physician has requested that you have an echocardiogram. Echocardiography is a painless test that uses sound waves to create images of your heart. It provides your doctor with information about the size and shape of your heart and how well your heart's chambers and valves are working. This procedure takes approximately one hour. There are no restrictions for this procedure.  Follow-Up: Your physician wants you to follow-up in: 3 months with Dr. Nishan.    If you need a refill on your cardiac medications before your next appointment, please call your pharmacy.     

## 2017-12-04 ENCOUNTER — Other Ambulatory Visit (HOSPITAL_COMMUNITY): Payer: 59

## 2017-12-05 LAB — ALPHA-1 ANTITRYPSIN PHENOTYPE: A-1 Antitrypsin, Ser: 146 mg/dL (ref 83–199)

## 2017-12-11 ENCOUNTER — Ambulatory Visit (HOSPITAL_COMMUNITY): Payer: 59 | Attending: Cardiovascular Disease

## 2017-12-11 ENCOUNTER — Other Ambulatory Visit: Payer: Self-pay

## 2017-12-11 DIAGNOSIS — E039 Hypothyroidism, unspecified: Secondary | ICD-10-CM | POA: Diagnosis not present

## 2017-12-11 DIAGNOSIS — E119 Type 2 diabetes mellitus without complications: Secondary | ICD-10-CM | POA: Insufficient documentation

## 2017-12-11 DIAGNOSIS — E7800 Pure hypercholesterolemia, unspecified: Secondary | ICD-10-CM | POA: Insufficient documentation

## 2017-12-11 DIAGNOSIS — R0600 Dyspnea, unspecified: Secondary | ICD-10-CM | POA: Insufficient documentation

## 2017-12-11 DIAGNOSIS — J45909 Unspecified asthma, uncomplicated: Secondary | ICD-10-CM | POA: Insufficient documentation

## 2017-12-11 DIAGNOSIS — I471 Supraventricular tachycardia: Secondary | ICD-10-CM | POA: Insufficient documentation

## 2017-12-11 DIAGNOSIS — I10 Essential (primary) hypertension: Secondary | ICD-10-CM | POA: Insufficient documentation

## 2017-12-13 NOTE — Progress Notes (Signed)
@Patient  ID: Veronica Rivers, female    DOB: [birth year], 52 y.o.   MRN: [id number]  Chief Complaint  Patient presents with  . Acute Visit    SOB, 1 week of sinus pain, drainage, pressure    Referring provider: Leighton Ruff, MD  HPI: 52 year old female remote smoker presents to our office on 11/30/2017 for evaluation of her emphysema. PMH: Hypertension, bipolar, hypothyroid, chronic pain syndrome requiring narcotics, neck surgery 2013 Patient of Dr. Elsworth Soho..  Former smoker.  Quit in 1999.  About 25-pack-year history.  Recent [facility] Encounters:   11/30/2017-initial office visit-[name] reports she had a sudden onset pain in her back between her shoulder blades on 11/01/2017 which prompted an emergency room visit.  CT Angio was obtained which did not show any pulmonary embolism.  We did show enlarged pulmonary arteries and changes of emphysema.  CT does show bullous changes in both episodes especially on the left.  Patient reports a diagnosis of asthma in her 69s triggered by exercise.  Was previously managed on Symbicort but now is on Brio Ellipta.  Patient is followed by Dr. Donneta Romberg and underwent detailed allergy testing which was negative.  She does live with a cat in the house.  Patient reports appointment with cardiology on 12/01/2017. Plan check alpha-1, full PFTs  Tests:   11/01/2017-CT Angio- emphysema, enlarged pulmonary arteries 12/11/2017-echocardiogram-LV ejection fraction 55 to 40%, grade 1 diastolic dysfunction 12/08/4479-[id number] ejection fraction 60 to 65% 11/30/17 - alpha 1 - negative    Chart Review:      12/14/17  Acute  52 year old patient seen for acute visit today.  Patient reporting that she still having chronic shortness of breath.  Patient has yet to complete pulmonary function test.  Patient reports 1 week of acute symptoms of nasal congestion, sinus pain, sinus pressure, chills, fatigue.  Patient reports she is been having yellow and green  mucus and nasal drainage.  Patient also reporting her insurance is no longer covering Spiriva.  And they will now cover Incruse Ellipta.  She needs a prescription for Incruse.  Patient has been adherent to her Breo Ellipta.     Allergies  Allergen Reactions  . Meloxicam Other (See Comments)    Pt stated, "Got Burning, Acid Reflux with medicine" Pt should not be on any anti-inflammatories d/t elevated LFTs  . Zocor [Simvastatin] Other (See Comments)    Caused muscle pain  . Sulfa Drugs Cross Reactors Rash    Immunization History  Administered Date(s) Administered  . Tdap 11/10/2012, 08/11/2014    Past Medical History:  Diagnosis Date  . Asthma   . Bipolar depression ([place])   . Cellulitis   . CHEST PAIN   . DM   . DYSPNEA   . HYPERCHOLESTEROLEMIA   . HYPERLIPIDEMIA   . HYPOTHYROIDISM   . Neuropathy   . SUPRAVENTRICULAR TACHYCARDIA     Tobacco History: Social History   Tobacco Use  Smoking Status Former Smoker  . Years: 15.00  . Types: Cigarettes  . Last attempt to quit: 11/23/1997  . Years since quitting: 20.0  Smokeless Tobacco Never Used   Counseling given: Yes Continue not smoking   Outpatient Encounter Medications as of 12/14/2017  Medication Sig  . albuterol (PROVENTIL HFA;VENTOLIN HFA) 108 (90 BASE) MCG/ACT inhaler Inhale 1-2 puffs into the lungs every 4 (four) hours as needed for wheezing or shortness of breath.   . ARIPiprazole (ABILIFY) 10 MG tablet Take 10 mg by mouth daily.  [name] BREO ELLIPTA 200-25 MCG/INH  AEPB Inhale 1 puff into the lungs daily.  . carvedilol (COREG) 12.5 MG tablet TAKE 1 TABLET TWICE A DAY WITH MEALS. Please keep upcoming appt for future refills. Thank you.  . clonazePAM (KLONOPIN) 1 MG tablet Take 0.5-1 mg by mouth See admin instructions. Pt takes one half every morning PRN anxiety and one at bedtime every day  . fluticasone (FLONASE) 50 MCG/ACT nasal spray Place 1 spray into both nostrils daily.   [name] gabapentin (NEURONTIN) 300 MG  capsule Take 300 mg by mouth 2 (two) times daily.   . hydrocortisone cream 0.5 % Apply 1 application topically 2 (two) times daily.  [name] JARDIANCE 25 MG TABS tablet Take 25 mg by mouth daily.   [name] lamoTRIgine (LAMICTAL) 200 MG tablet Take 300 mg by mouth daily.  [name] levocetirizine (XYZAL) 5 MG tablet Take 5 mg by mouth every evening.  [name] levothyroxine (SYNTHROID, LEVOTHROID) 137 MCG tablet Take 137 mcg by mouth at bedtime.   [name] lisinopril (PRINIVIL,ZESTRIL) 2.5 MG tablet Take 2.5 mg by mouth daily.  . metFORMIN (GLUCOPHAGE-XR) 500 MG 24 hr tablet TAKE 2 TABLETS BY MOUTH EVERY MORNING AND 2 TABLET AT NIGHT  . montelukast (SINGULAIR) 10 MG tablet Take 10 mg by mouth at bedtime.  . Nutritional Supplements (ESTROVEN WEIGHT MANAGEMENT) CAPS Take 1 capsule by mouth every evening.   . ONE TOUCH ULTRA TEST test strip Inject 1 strip into the skin daily.  . Oxycodone HCl 10 MG TABS Take 10 mg by mouth every 6 (six) hours as needed for pain. for pain  . QUEtiapine (SEROQUEL) 400 MG tablet Take 400 mg by mouth at bedtime.  [name] SPIRIVA RESPIMAT 1.25 MCG/ACT AERS Take 2 puffs by mouth every morning.  [name] doxycycline (VIBRA-TABS) 100 MG tablet Take 1 tablet (100 mg total) by mouth 2 (two) times daily.  . predniSONE (DELTASONE) 10 MG tablet Take 2 tablets (20mg  total) daily for the next 5 days. Take in the AM with food.  . umeclidinium bromide (INCRUSE ELLIPTA) 62.5 MCG/INH AEPB Inhale 1 puff into the lungs daily.   No facility-administered encounter medications on file as of 12/14/2017.      Review of Systems  Review of Systems  Constitutional: Positive for fatigue. Negative for chills, fever and unexpected weight change.  HENT: Positive for congestion, sinus pressure and sinus pain. Negative for ear pain, postnasal drip, rhinorrhea, sneezing and sore throat.   Respiratory: Positive for cough (yellow green mucous ), chest tightness, shortness of breath (chronic - worsened ) and wheezing.   Cardiovascular: Negative  for chest pain and palpitations.  Gastrointestinal: Negative for blood in stool, diarrhea, nausea and vomiting.  Genitourinary: Negative for dysuria, frequency and urgency.  Musculoskeletal: Negative for arthralgias.  Skin: Negative for color change.  Allergic/Immunologic: Negative for environmental allergies and food allergies.  Neurological: Negative for dizziness, light-headedness and headaches.  Psychiatric/Behavioral: Negative for dysphoric mood. The patient is not nervous/anxious.       Physical Exam  BP 118/72   Pulse 79   Temp 97.9 F (36.6 C) (Oral)   Ht 5\' 9"  (1.753 m)   Wt 238 lb 3.2 oz (108 kg)   SpO2 97%   BMI 35.18 kg/m   Wt Readings from Last 5 Encounters:  12/14/17 238 lb 3.2 oz (108 kg)  12/01/17 237 lb (107.5 kg)  11/30/17 237 lb (107.5 kg)  11/01/17 235 lb (106.6 kg)  10/31/16 249 lb 12.8 oz (113.3 kg)     Physical Exam  Constitutional: She is  oriented to person, place, and time and well-developed, well-nourished, and in no distress. No distress.  HENT:  Head: Normocephalic and atraumatic.  Right Ear: Hearing, tympanic membrane, external ear and ear canal normal.  Left Ear: Hearing, tympanic membrane, external ear and ear canal normal.  Nose: Right sinus exhibits maxillary sinus tenderness and frontal sinus tenderness. Left sinus exhibits maxillary sinus tenderness and frontal sinus tenderness.  Mouth/Throat: Uvula is midline and oropharynx is clear and moist. No oropharyngeal exudate.  R>L sinus pain   Eyes: Pupils are equal, round, and reactive to light.  Neck: Normal range of motion. Neck supple. No JVD present.  Cardiovascular: Normal rate, regular rhythm and normal heart sounds.  Pulmonary/Chest: Effort normal and breath sounds normal. No accessory muscle usage. No respiratory distress. She has no decreased breath sounds. She has no wheezes. She has no rhonchi.  Abdominal: Soft. Bowel sounds are normal. There is no tenderness.  Musculoskeletal:  Normal range of motion. She exhibits no edema.  Lymphadenopathy:    She has no cervical adenopathy.  Neurological: She is alert and oriented to person, place, and time. Gait normal.  Skin: Skin is warm and dry. She is not diaphoretic. No erythema.  Psychiatric: Mood, memory, affect and judgment normal.  Nursing note and vitals reviewed.     Lab Results:  CBC    Component Value Date/Time   WBC 6.8 11/01/2017 0920   RBC 4.71 11/01/2017 0920   HGB 15.0 11/01/2017 0920   HCT 46.5 (H) 11/01/2017 0920   PLT 270 11/01/2017 0920   MCV 98.7 11/01/2017 0920   MCH 31.8 11/01/2017 0920   MCHC 32.3 11/01/2017 0920   RDW 13.6 11/01/2017 0920   LYMPHSABS 2.5 12/01/2015 1521   MONOABS 0.6 12/01/2015 1521   EOSABS 0.1 12/01/2015 1521   BASOSABS 0.0 12/01/2015 1521    BMET    Component Value Date/Time   NA 143 11/01/2017 0920   K 3.8 11/01/2017 0920   CL 104 11/01/2017 0920   CO2 33 (H) 11/01/2017 0920   GLUCOSE 206 (H) 11/01/2017 0920   BUN 13 11/01/2017 0920   CREATININE 1.01 (H) 11/01/2017 0920   CALCIUM 9.6 11/01/2017 0920   GFRNONAA >60 11/01/2017 0920   GFRAA >60 11/01/2017 0920    BNP No results found for: BNP  ProBNP    Component Value Date/Time   PROBNP 23.0 08/07/2013 1015    Imaging: No results found.   Assessment & Plan:   Pleasant 52 year old patient with acute sinusitis.  Will treat with doxycycline and short course of prednisone.  Also encourage patient to start Flonase 1 spray each nostril daily for the next 2 weeks to help with nasal congestion and drainage.  Patient to follow-up with our office in 4 weeks and complete pulmonary function testing either on the day or prior to it.  Consider Pneumovax and Flu vaccine at next visit   Centrilobular emphysema ([place]) Need to schedule pulmonary function test  4 week appt with our office  Doxycycline >>> 1 100 mg tablet every 12 hours for 7 days >>>take with food  >>>wear sunscreen   Prednisone 10mg   tablet  >>>Take 2 tablets (20 mg total) daily for the next 5 days >>> Take with food in the morning  We will switch spiriva to incruse ellipta   Continue Breo Ellipta   Acute sinusitis  Doxycycline >>> 1 100 mg tablet every 12 hours for 7 days >>>take with food  >>>wear sunscreen   Prednisone 10mg  tablet  >>>  Take 2 tablets (20 mg total) daily for the next 5 days >>> Take with food in the morning   Flonase nasal spray 1 spray each nostril daily for the next 2 weeks, then as needed for allergy / nasal congestion       Lauraine Rinne, NP 12/14/2017

## 2017-12-14 ENCOUNTER — Ambulatory Visit: Payer: 59 | Admitting: Pulmonary Disease

## 2017-12-14 ENCOUNTER — Encounter: Payer: Self-pay | Admitting: Pulmonary Disease

## 2017-12-14 VITALS — BP 118/72 | HR 79 | Temp 97.9°F | Ht 69.0 in | Wt 238.2 lb

## 2017-12-14 DIAGNOSIS — J019 Acute sinusitis, unspecified: Secondary | ICD-10-CM | POA: Insufficient documentation

## 2017-12-14 DIAGNOSIS — J432 Centrilobular emphysema: Secondary | ICD-10-CM

## 2017-12-14 DIAGNOSIS — J0100 Acute maxillary sinusitis, unspecified: Secondary | ICD-10-CM

## 2017-12-14 MED ORDER — PREDNISONE 10 MG PO TABS: ORAL_TABLET | ORAL | 0 refills | Status: DC

## 2017-12-14 MED ORDER — FLUTICASONE FUROATE-VILANTEROL 200-25 MCG/INH IN AEPB: 1.0000 | INHALATION_SPRAY | Freq: Every day | RESPIRATORY_TRACT | 2 refills | Status: DC

## 2017-12-14 MED ORDER — DOXYCYCLINE HYCLATE 100 MG PO TABS
100.0000 mg | ORAL_TABLET | Freq: Two times a day (BID) | ORAL | 0 refills | Status: DC
Start: 2017-12-14 — End: 2017-12-26

## 2017-12-14 MED ORDER — UMECLIDINIUM BROMIDE 62.5 MCG/INH IN AEPB: 1.0000 | INHALATION_SPRAY | Freq: Every day | RESPIRATORY_TRACT | 5 refills | Status: DC

## 2017-12-14 NOTE — Assessment & Plan Note (Signed)
Need to schedule pulmonary function test  4 week appt with our office  Doxycycline >>> 1 100 mg tablet every 12 hours for 7 days >>>take with food  >>>wear sunscreen   Prednisone 10mg  tablet  >>>Take 2 tablets (20 mg total) daily for the next 5 days >>> Take with food in the morning  We will switch spiriva to incruse ellipta   Continue Breo Ellipta

## 2017-12-14 NOTE — Patient Instructions (Addendum)
Need to schedule pulmonary function test  4 week appt   Doxycycline >>> 1 100 mg tablet every 12 hours for 7 days >>>take with food  >>>wear sunscreen   Prednisone 10mg  tablet  >>>Take 2 tablets (20 mg total) daily for the next 5 days >>> Take with food in the morning   Flonase nasal spray 1 spray each nostril daily for the next 2 weeks, then as needed for allergy / nasal congestion   We will switch spiriva to incruse ellipta   Continue Breo Ellipta      Please contact the office if your symptoms worsen or you have concerns that you are not improving.   Thank you for choosing [facility] for your healthcare, and for allowing [place] to partner with you on your healthcare journey. I am thankful to be able to provide care to you today.   Wyn Quaker FNP-C

## 2017-12-14 NOTE — Assessment & Plan Note (Signed)
  Doxycycline >>> 1 100 mg tablet every 12 hours for 7 days >>>take with food  >>>wear sunscreen   Prednisone 10mg  tablet  >>>Take 2 tablets (20 mg total) daily for the next 5 days >>> Take with food in the morning   Flonase nasal spray 1 spray each nostril daily for the next 2 weeks, then as needed for allergy / nasal congestion

## 2017-12-14 NOTE — Addendum Note (Signed)
Addended by: Vivia Ewing on: 12/14/2017 10:07 AM   Modules accepted: Orders

## 2017-12-17 ENCOUNTER — Other Ambulatory Visit: Payer: Self-pay | Admitting: Cardiovascular Disease

## 2017-12-26 ENCOUNTER — Ambulatory Visit: Payer: 59 | Admitting: Podiatry

## 2017-12-26 ENCOUNTER — Encounter: Payer: Self-pay | Admitting: Podiatry

## 2017-12-26 ENCOUNTER — Ambulatory Visit (INDEPENDENT_AMBULATORY_CARE_PROVIDER_SITE_OTHER): Payer: 59

## 2017-12-26 DIAGNOSIS — S90122A Contusion of left lesser toe(s) without damage to nail, initial encounter: Secondary | ICD-10-CM

## 2017-12-26 DIAGNOSIS — S92505A Nondisplaced unspecified fracture of left lesser toe(s), initial encounter for closed fracture: Secondary | ICD-10-CM | POA: Diagnosis not present

## 2017-12-26 NOTE — Progress Notes (Signed)
She presents today as a diabetic with a chief concern of a fracture to the fifth digit that occurred last night.  She states is bruised and swollen and very sore.  Objective: Vital signs are stable she is alert and oriented x3 I reviewed her past medical history medications allergy surgeries social history no changes.  Fifth digit left foot does demonstrate ecchymosis minimal edema tender on palpation range of motion.  Radiographs confirm a nondisplaced linear fracture of the proximal phalanx fifth digit left foot with a small nondisplaced fracture at the base of the distal phalanx.  Assessment fracture fifth toe left foot.  Plan: Demonstrated for her how to tape the toe with Covan and recommended a Darco shoe.  Follow-up with her as needed

## 2018-01-10 ENCOUNTER — Other Ambulatory Visit: Payer: Self-pay | Admitting: Pulmonary Disease

## 2018-01-10 DIAGNOSIS — J432 Centrilobular emphysema: Secondary | ICD-10-CM

## 2018-01-10 MED ORDER — UMECLIDINIUM BROMIDE 62.5 MCG/INH IN AEPB: 1.0000 | INHALATION_SPRAY | Freq: Every day | RESPIRATORY_TRACT | 3 refills | Status: DC

## 2018-02-04 ENCOUNTER — Other Ambulatory Visit: Payer: Self-pay | Admitting: Cardiovascular Disease

## 2018-02-06 ENCOUNTER — Ambulatory Visit: Payer: 59 | Admitting: Pulmonary Disease

## 2018-02-06 ENCOUNTER — Other Ambulatory Visit: Payer: Self-pay

## 2018-02-06 DIAGNOSIS — J432 Centrilobular emphysema: Secondary | ICD-10-CM

## 2018-02-06 MED ORDER — FLUTICASONE FUROATE-VILANTEROL 200-25 MCG/INH IN AEPB: 1.0000 | INHALATION_SPRAY | Freq: Every day | RESPIRATORY_TRACT | 1 refills | Status: DC

## 2018-02-26 ENCOUNTER — Telehealth: Payer: Self-pay | Admitting: Pulmonary Disease

## 2018-02-26 MED ORDER — MONTELUKAST SODIUM 10 MG PO TABS: 10.0000 mg | ORAL_TABLET | Freq: Every day | ORAL | 2 refills | Status: DC

## 2018-02-26 NOTE — Telephone Encounter (Signed)
Received a request for a refill on patient's Singulair 10mg  from [facility]. Per patient's chart, this has never been sent to [facility] from our office. Called patient and verified that she wants to use [facility], she stated that she does. Advised her that I would send in a refill.   Nothing else needed at time of call.

## 2018-03-06 NOTE — Progress Notes (Signed)
Patient ID: Veronica Rivers, female   DOB: 1966/01/03, 52 y.o.   MRN: [id number]     Veronica Rivers is seen today for f.u of HTN, elevated lipids atypicdal chest pain history of SVT. She has been doing well. Some vetigo on antivert . She eats too many carbs and we discussed her diet and weight loss at length. She is not having recurrent palpitations or SSCP. She has been compliant with her meds.   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 R/O CXR and CTA normal  No PE mild CE emphysema Seen by pulmonary Dr Veronica Rivers 11/30/17 and PFTls ordered not performed yet   No further pains   ROS: Denies fever, malais, weight loss, blurry vision, decreased visual acuity, cough, sputum, SOB, hemoptysis, pleuritic pain, palpitaitons, heartburn, abdominal pain, melena, lower extremity edema, claudication, or rash.  All other systems reviewed and negative  General: BP 102/62   Pulse 76   Ht 5\' 9"  (1.753 m)   Wt 244 lb 12 oz (111 kg)   SpO2 97%   BMI 36.14 kg/m  Affect appropriate Obese white female  HEENT: normal Neck supple with no adenopathy JVP normal no bruits no thyromegaly Lungs clear with no wheezing and good diaphragmatic motion Heart:  P8/E4 mid systolic click SEM murmur, no rub, gallop or click PMI normal Abdomen: benighn, BS positve, no tenderness, no AAA no bruit.  No HSM or HJR Distal pulses intact with no bruits No edema Neuro non-focal Skin warm and dry No muscular weakness    Current Outpatient Medications  Medication Sig Dispense Refill  . albuterol (PROVENTIL HFA;VENTOLIN HFA) 108 (90 BASE) MCG/ACT inhaler Inhale 1-2 puffs into the lungs every 4 (four) hours as needed for wheezing or shortness of breath.     . ARIPiprazole (ABILIFY) 15 MG tablet Take 15 mg by mouth daily.    . carvedilol (COREG) 12.5 MG tablet TAKE 1 TABLET TWICE A DAY WITH MEALS.  PLEASE KEEP UPCOMING APPOINTMENT FOR FUTURE REFILLS 180 tablet 2  . clonazePAM (KLONOPIN) 1 MG tablet Take 0.5-1 mg by mouth See admin instructions. Pt takes one half every morning PRN anxiety and one at bedtime every day  1  . fluticasone (FLONASE) 50 MCG/ACT nasal spray Place 1 spray into both nostrils daily.     . fluticasone furoate-vilanterol (BREO ELLIPTA) 200-25 MCG/INH AEPB Inhale 1 puff into the lungs daily. 180 each 1  . gabapentin (NEURONTIN) 300 MG capsule Take 300 mg by mouth 2 (two) times daily.   0  . hydrocortisone cream 0.5 % Apply 1 application topically 2 (two) times daily.    [name] JARDIANCE 25 MG TABS tablet Take 25 mg by mouth daily.   11  . lamoTRIgine (LAMICTAL) 200 MG tablet Take 300 mg by mouth daily.    [name] levocetirizine (XYZAL) 5 MG tablet Take 5 mg by mouth every evening.    [name] levothyroxine (SYNTHROID, LEVOTHROID) 137 MCG tablet Take 137 mcg by mouth at bedtime.     [name] lisinopril (PRINIVIL,ZESTRIL) 2.5 MG tablet Take 2.5 mg by mouth daily.  0  . metFORMIN (GLUCOPHAGE-XR) 500 MG 24 hr tablet TAKE 2 TABLETS BY MOUTH EVERY MORNING AND 2 TABLET AT NIGHT  1  . montelukast (SINGULAIR) 10 MG tablet Take 1 tablet (10 mg total) by mouth at bedtime. 90 tablet 2  . Nutritional Supplements (ESTROVEN WEIGHT  MANAGEMENT) CAPS Take 1 capsule by mouth every evening.     . ONE TOUCH ULTRA TEST test strip Inject 1 strip into the skin daily.  3  . Oxycodone HCl 10 MG TABS Take 10 mg by mouth every 6 (six) hours as needed for pain. for pain  0  . QUEtiapine (SEROQUEL) 400 MG tablet Take 400 mg by mouth at bedtime.    [name] umeclidinium bromide (INCRUSE ELLIPTA) 62.5 MCG/INH AEPB Inhale 1 puff into the lungs daily. 90 each 3   No current facility-administered medications for this visit.     Allergies  Meloxicam; Zocor [simvastatin]; and Sulfa drugs cross reactors  Electrocardiogram:  08/07/13  SR rate 98 poor R wave progression nonspecific ST/T wave changes  10/27/14  SR rate 78 low voltage  nonspecific ST changes no change from 2015  10/31/16  SR rate 80 low voltage otherwise normal   Assessment and Plan HTN:  Well controlled.  Continue current medications and low sodium Dash type diet.   DM:  Discussed low carb diet.  Target hemoglobin A1c is 6.5 or less.  Continue current medications. SVT:  Quiescent infrequent palpitaitons continue beta blocker Can take extra coreg as needed  Chest Pain:  Resolved normal cardiopulmonary stress test 2015 observe Anxiety:  On abilify seems stable Dyspnea:  Related to emphysema and obesity f/u [place] with PFT;s Murmur:  No valve disease on echo EF 55-60% TTE done 12/11/17 Thyroid:  On replacement f/u labs with primary    F/U 1 year  Veronica Rivers

## 2018-03-08 ENCOUNTER — Encounter: Payer: Self-pay | Admitting: Cardiovascular Disease

## 2018-03-08 ENCOUNTER — Ambulatory Visit: Payer: 59 | Admitting: Cardiovascular Disease

## 2018-03-08 VITALS — BP 102/62 | HR 76 | Ht 69.0 in | Wt 244.8 lb

## 2018-03-08 DIAGNOSIS — I10 Essential (primary) hypertension: Secondary | ICD-10-CM | POA: Diagnosis not present

## 2018-03-08 NOTE — Patient Instructions (Signed)

## 2018-04-03 ENCOUNTER — Ambulatory Visit: Payer: 59 | Admitting: Pulmonary Disease

## 2018-04-19 ENCOUNTER — Ambulatory Visit: Payer: 59 | Admitting: Pulmonary Disease

## 2018-04-19 ENCOUNTER — Encounter: Payer: Self-pay | Admitting: Pulmonary Disease

## 2018-04-19 ENCOUNTER — Ambulatory Visit (INDEPENDENT_AMBULATORY_CARE_PROVIDER_SITE_OTHER): Payer: 59 | Admitting: Pulmonary Disease

## 2018-04-19 DIAGNOSIS — J432 Centrilobular emphysema: Secondary | ICD-10-CM

## 2018-04-19 DIAGNOSIS — I10 Essential (primary) hypertension: Secondary | ICD-10-CM | POA: Diagnosis not present

## 2018-04-19 LAB — PULMONARY FUNCTION TEST
DL/VA % pred: 110 %
DL/VA: 5.97 ml/min/mmHg/L
DLCO unc % pred: 85 %
DLCO unc: 26.94 ml/min/mmHg
FEF 25-75 Post: 3.75 L/sec
FEF 25-75 Pre: 3.43 L/sec
FEF2575-%Change-Post: 9 %
FEF2575-%Pred-Post: 124 %
FEF2575-%Pred-Pre: 113 %
FEV1-%Change-Post: 2 %
FEV1-%Pred-Post: 84 %
FEV1-%Pred-Pre: 82 %
FEV1-Post: 2.79 L
FEV1-Pre: 2.73 L
FEV1FVC-%Change-Post: 1 %
FEV1FVC-%Pred-Pre: 105 %
FEV6-%Change-Post: 2 %
FEV6-%Pred-Post: 80 %
FEV6-%Pred-Pre: 78 %
FEV6-Post: 3.28 L
FEV6-Pre: 3.21 L
FEV6FVC-%Change-Post: 1 %
FEV6FVC-%Pred-Post: 102 %
FEV6FVC-%Pred-Pre: 101 %
FVC-%Change-Post: 1 %
FVC-%Pred-Post: 78 %
FVC-%Pred-Pre: 77 %
FVC-Post: 3.28 L
FVC-Pre: 3.25 L
Post FEV1/FVC ratio: 85 %
Post FEV6/FVC ratio: 100 %
Pre FEV1/FVC ratio: 84 %
Pre FEV6/FVC Ratio: 99 %
RV % pred: 96 %
RV: 2.04 L
TLC % pred: 89 %
TLC: 5.29 L

## 2018-04-19 NOTE — Progress Notes (Signed)
Results were discussed with the patient with Dr. Elsworth Soho at 04/19/2018 office visit.  Wyn Quaker FNP

## 2018-04-19 NOTE — Assessment & Plan Note (Signed)
changes of emphysema in your lungs. Lung function is good-that is good news.  Trigger for your flareups is unclear-possibilities include sinus drip or cats Would like to evaluate her during 1 such flareup  Okay to stop Incruse after you are done with your current inhaler. Stay on Breo once daily, rinse mouth after use.

## 2018-04-19 NOTE — Patient Instructions (Signed)
You have changes of emphysema in your lungs. Lung function is good-that is good news.  Trigger for your flareups is unclear.  Okay to stop Incruse after you are done with your current inhaler. Stay on Breo once daily, rinse mouth after use.  Call us if you have another flareup so that we can get you in

## 2018-04-19 NOTE — Progress Notes (Signed)
PFT done today. 

## 2018-04-19 NOTE — Assessment & Plan Note (Signed)
On low-dose lisinopril but no coughing, seems to be tolerating

## 2018-04-19 NOTE — Progress Notes (Signed)
   Subjective:    Patient ID: Veronica Rivers, female    DOB: May 24, 1965, 52 y.o.   MRN: [id number]  HPI  52 year old remote smoker for FU of emphysema. She smoked about 1.5 packs/day before she quit in 99 about 25 pack years.  She reports a diagnosis of asthma in her 52s triggered by exercise and weather changes and chest colds.  She was on Symbicort for a while and this was changed to [place] about 2 years ago.  She follows with Dr. Donneta Romberg and underwent detailed allergy testing which was negative. 4  Cats + 1 dog  in the house.  She had 2 flareups in the last 6 months requiring steroid Dosepak and antibiotics, last such was 2 weeks ago.  She has chest pain radiating to her back and between her shoulders when this happens and prednisone certainly seems to help although it does worsen her sugars. We reviewed CT today and will discussed PFTs in detail-surprisingly lung function is preserved   Significant tests/ events reviewed  10/2017  CT angiogram -no  evidence of pulmonary embolus,changes of bullous emphysema in apices    04/2018 PFTs ratio normal, FEV1 82%, TLC 89%, DLCO 85%      Review of Systems neg for any significant sore throat, dysphagia, itching, sneezing, nasal congestion or excess/ purulent secretions, fever, chills, sweats, unintended wt loss, pleuritic or exertional cp, hempoptysis, orthopnea pnd or change in chronic leg swelling. Also denies presyncope, palpitations, heartburn, abdominal pain, nausea, vomiting, diarrhea or change in bowel or urinary habits, dysuria,hematuria, rash, arthralgias, visual complaints, headache, numbness weakness or ataxia.     Objective:   Physical Exam   Gen. Pleasant, obese, in no distress ENT - no lesions, no post nasal drip Neck: No JVD, no thyromegaly, no carotid bruits Lungs: no use of accessory muscles, no dullness to percussion, decreased without rales or rhonchi  Cardiovascular: Rhythm regular, heart sounds  normal, no murmurs or  gallops, no peripheral edema Musculoskeletal: No deformities, no cyanosis or clubbing , no tremors         Assessment & Plan:

## 2018-05-08 ENCOUNTER — Encounter: Payer: Self-pay | Admitting: Podiatry

## 2018-05-08 ENCOUNTER — Ambulatory Visit (INDEPENDENT_AMBULATORY_CARE_PROVIDER_SITE_OTHER): Payer: 59

## 2018-05-08 ENCOUNTER — Ambulatory Visit: Payer: 59 | Admitting: Podiatry

## 2018-05-08 DIAGNOSIS — S9032XA Contusion of left foot, initial encounter: Secondary | ICD-10-CM

## 2018-05-08 DIAGNOSIS — S93602A Unspecified sprain of left foot, initial encounter: Secondary | ICD-10-CM | POA: Diagnosis not present

## 2018-05-08 NOTE — Progress Notes (Signed)
She presents today states that she has left ankle pain is been going on since she twisted her ankle going down a set of steps over the weekend and around December 20 or so.  States that she was running down the stairs to the garage twisted her foot did not hurt anything else.  Last hemoglobin A1c was 7.9.  Objective: Vital signs are stable alert and oriented x3.  Pulses are palpable.  Neurologic sensorium is intact detailed reflexes are intact.  Muscles drink this normal symmetrical.  She has swelling and tenderness on palpation of the anterior talofibular ligament area no pain on palpation of the fibula itself.  Radiographs do not demonstrate any cortical interruption of any of the bones no dislocations no malalignments.  No fractures identified.  No acute trauma.  Assessment: Ankle sprain left.  Plan: Placed her in a Tri-Lock brace follow-up with her in 6 weeks.  Recommended rest ice compression and elevation.

## 2018-06-19 ENCOUNTER — Ambulatory Visit: Payer: 59 | Admitting: Podiatry

## 2018-08-20 ENCOUNTER — Ambulatory Visit: Payer: 59 | Admitting: Pulmonary Disease

## 2018-09-05 ENCOUNTER — Telehealth: Payer: Self-pay | Admitting: Cardiovascular Disease

## 2018-09-05 NOTE — Telephone Encounter (Signed)
  Pt c/o BP issue: STAT if pt c/o blurred vision, one-sided weakness or slurred speech  1. What are your last 5 BP readings? 93/68 taken @ 12:10 today  2. Are you having any other symptoms (ex. Dizziness, headache, blurred vision, passed out)? Dizziness, feeling like she is going to pass out  3. What is your BP issue? Patient was out working in the yard and all of a sudden felt dizziness and like she would pass out. She came in and sat down and took her BP which was 93/68. Patient called her PCP and he told her to contact Dr Mariana Arn office. Patient states that she has had low blood pressure before but not this low and she never felt dizzy with it before.

## 2018-09-05 NOTE — Telephone Encounter (Signed)
Called patient back about her message. Patient stated she was working out in the yard and felt dizzy. Patient came in from outside and sat down and took her BP, which was 93/68. Patient stated her HR is around 60's to 70's. Encouraged patient to drink some fluids and have a small salty snack. Patient stated this has only occurred once. Encouraged patient to take her BP before she takes her lisinopril tomorrow and if her BP is still low to hold it and give our office a call. Patient stated she was feeling fine now. Patient stated she will drink more fluids and see if that helps. Patient will call if her symptoms do not improve. Will forward to Dr. Marlou Porch for further advisement.

## 2018-09-06 NOTE — Telephone Encounter (Signed)
Agree with plan Calhoun Reichardt, MD  

## 2018-10-22 ENCOUNTER — Other Ambulatory Visit: Payer: Self-pay | Admitting: Cardiovascular Disease

## 2018-10-29 ENCOUNTER — Ambulatory Visit: Payer: 59 | Admitting: Pulmonary Disease

## 2018-11-06 ENCOUNTER — Ambulatory Visit: Payer: 59 | Admitting: Pulmonary Disease

## 2018-11-26 ENCOUNTER — Ambulatory Visit: Payer: 59 | Admitting: Pulmonary Disease

## 2018-12-06 ENCOUNTER — Emergency Department (HOSPITAL_COMMUNITY)
Admission: EM | Admit: 2018-12-06 | Discharge: 2018-12-06 | Disposition: A | Discharge status: Home / Self Care | Payer: PRIVATE HEALTH INSURANCE | Attending: Emergency Medicine | Admitting: Emergency Medicine

## 2018-12-06 ENCOUNTER — Other Ambulatory Visit: Payer: Self-pay

## 2018-12-06 ENCOUNTER — Encounter (HOSPITAL_COMMUNITY): Payer: Self-pay

## 2018-12-06 ENCOUNTER — Emergency Department (HOSPITAL_COMMUNITY): Payer: PRIVATE HEALTH INSURANCE

## 2018-12-06 DIAGNOSIS — J45909 Unspecified asthma, uncomplicated: Secondary | ICD-10-CM | POA: Diagnosis not present

## 2018-12-06 DIAGNOSIS — E119 Type 2 diabetes mellitus without complications: Secondary | ICD-10-CM | POA: Insufficient documentation

## 2018-12-06 DIAGNOSIS — E039 Hypothyroidism, unspecified: Secondary | ICD-10-CM | POA: Insufficient documentation

## 2018-12-06 DIAGNOSIS — R079 Chest pain, unspecified: Secondary | ICD-10-CM

## 2018-12-06 DIAGNOSIS — Z87891 Personal history of nicotine dependence: Secondary | ICD-10-CM | POA: Diagnosis not present

## 2018-12-06 LAB — BASIC METABOLIC PANEL
Anion gap: 9 (ref 5–15)
BUN: 12 mg/dL (ref 6–20)
CO2: 29 mmol/L (ref 22–32)
Calcium: 9.3 mg/dL (ref 8.9–10.3)
Chloride: 101 mmol/L (ref 98–111)
Creatinine, Ser: 0.93 mg/dL (ref 0.44–1.00)
GFR calc Af Amer: >60 mL/min (ref >60)
GFR calc non Af Amer: >60 mL/min (ref >60)
Glucose, Bld: 227 mg/dL — ABNORMAL HIGH (ref 70–99)
Potassium: 3.8 mmol/L (ref 3.5–5.1)
Sodium: 139 mmol/L (ref 135–145)

## 2018-12-06 LAB — CBC
HCT: 49.5 % — ABNORMAL HIGH (ref 36.0–46.0)
Hemoglobin: 15.6 g/dL — ABNORMAL HIGH (ref 12.0–15.0)
MCH: 30.8 pg (ref 26.0–34.0)
MCHC: 31.5 g/dL (ref 30.0–36.0)
MCV: 97.8 fL (ref 80.0–100.0)
Platelets: 251 10*3/uL (ref 150–400)
RBC: 5.06 MIL/uL (ref 3.87–5.11)
RDW: 12.8 % (ref 11.5–15.5)
WBC: 5.9 10*3/uL (ref 4.0–10.5)
nRBC: 0 % (ref 0.0–0.2)

## 2018-12-06 LAB — TROPONIN I (HIGH SENSITIVITY)
Troponin I (High Sensitivity): <2 ng/L (ref <18)
Troponin I (High Sensitivity): <2 ng/L (ref <18)

## 2018-12-06 MED ORDER — NITROGLYCERIN 0.4 MG SL SUBL: 0.4000 mg | SUBLINGUAL_TABLET | SUBLINGUAL | Status: DC | PRN

## 2018-12-06 MED ORDER — OXYCODONE HCL 5 MG PO TABS
5.0000 mg | ORAL_TABLET | Freq: Once | ORAL | Status: AC
  Administered 2018-12-06: 09:00:00 5 mg via ORAL
  Filled 2018-12-06: qty 1

## 2018-12-06 MED ORDER — NITROGLYCERIN 0.4 MG SL SUBL
0.4000 mg | SUBLINGUAL_TABLET | Freq: Once | SUBLINGUAL | Status: AC
  Administered 2018-12-06: 0.4 mg via SUBLINGUAL
  Filled 2018-12-06: qty 1

## 2018-12-06 MED ORDER — SODIUM CHLORIDE 0.9% FLUSH: 3.0000 mL | Freq: Once | INTRAVENOUS | Status: DC

## 2018-12-06 MED ORDER — SUCRALFATE 1 G PO TABS: 1.0000 g | ORAL_TABLET | Freq: Four times a day (QID) | ORAL | 0 refills | Status: DC | PRN

## 2018-12-06 MED ORDER — OMEPRAZOLE 20 MG PO CPDR: 20.0000 mg | DELAYED_RELEASE_CAPSULE | Freq: Every day | ORAL | 1 refills | Status: DC

## 2018-12-06 MED ORDER — NITROGLYCERIN 0.4 MG SL SUBL
0.4000 mg | SUBLINGUAL_TABLET | SUBLINGUAL | Status: AC | PRN
  Administered 2018-12-06 (×2): 0.4 mg via SUBLINGUAL
  Filled 2018-12-06: qty 1

## 2018-12-06 MED ORDER — ASPIRIN 81 MG PO CHEW
324.0000 mg | CHEWABLE_TABLET | Freq: Once | ORAL | Status: AC
  Administered 2018-12-06: 09:00:00 324 mg via ORAL
  Filled 2018-12-06: qty 4

## 2018-12-06 NOTE — Discharge Instructions (Signed)
You were evaluated in the Emergency Department and after careful evaluation, we did not find any emergent condition requiring admission or further testing in the hospital.  Your symptoms today seem to be due to esophageal spasm related to acid reflux.  Your testing today was reassuring with no evidence of heart damage.  Please take the omeprazole and Carafate medications as we discussed.  Given your age and your diabetes, we do recommend follow-up with your primary care doctor to discuss need for outpatient stress testing.  Please return to the Emergency Department if you experience any worsening of your condition.  We encourage you to follow up with a primary care provider.  Thank you for allowing [place] to be a part of your care.

## 2018-12-06 NOTE — ED Triage Notes (Signed)
Patient c/o intermittent chest pain x 2 days, but more constant this AM. Patient diaphoretic in triage. Patient's sats in triage 91-93%, but states that she has a history of COPD and that is normal for her.

## 2018-12-06 NOTE — ED Notes (Signed)
Pt transported to Xray at this time.

## 2018-12-06 NOTE — ED Provider Notes (Signed)
[facility] Emergency Department Provider Note MRN:  [id number]  Arrival date & time: 12/06/18     Chief Complaint   Chest Pain   History of Present Illness   Veronica Rivers is a 53 y.o. year-old female with a history of COPD, diabetes presenting to the ED with chief complaint of chest pain.  Pain began yesterday afternoon, central chest pain, described as a sharp tightness, resolved, but then returned this morning at 5 AM.  Pain is 8 out of 10 in severity, constant, no exacerbating or relieving factors.  Denies dizziness, no diaphoresis, no nausea, no vomiting, shortness of breath.  Denies leg pain or swelling, no abdominal pain, no recent fever or cough.  Review of Systems  A complete 10 system review of systems was obtained and all systems are negative except as noted in the HPI and PMH.   Patient's Health History    Past Medical History:  Diagnosis Date  . Asthma   . Bipolar depression ([place])   . Cellulitis   . CHEST PAIN   . DM   . DYSPNEA   . Emphysema of lung ([place])   . HYPERCHOLESTEROLEMIA   . HYPERLIPIDEMIA   . HYPOTHYROIDISM   . Neuropathy   . SUPRAVENTRICULAR TACHYCARDIA     Past Surgical History:  Procedure Laterality Date  . CESAREAN SECTION    . FOOT SURGERY    . NASAL SEPTUM SURGERY    . NECK SURGERY    . TONSILLECTOMY    . TUBAL LIGATION      Family History  Problem Relation Age of Onset  . Hypertension Mother   . COPD Mother        Died age 10  . Cancer Father        Lung, died in his 80s    Social History   Socioeconomic History  . Marital status: Married    Spouse name: Agnew  . Number of children: 1  . Years of education: 56  . Highest education level: Not on file  Occupational History  . Occupation: Unemployed  Social Needs  . Financial resource strain: Not on file  . Food insecurity    Worry: Not on file    Inability: Not on file  . Transportation needs    Medical: Not on file    Non-medical: Not on file   Tobacco Use  . Smoking status: Former Smoker    Years: 15.00    Types: Cigarettes    Quit date: 11/23/1997    Years since quitting: 21.0  . Smokeless tobacco: Never Used  Substance and Sexual Activity  . Alcohol use: No  . Drug use: No  . Sexual activity: Yes    Birth control/protection: Surgical  Lifestyle  . Physical activity    Days per week: Not on file    Minutes per session: Not on file  . Stress: Not on file  Relationships  . Social Herbalist on phone: Not on file    Gets together: Not on file    Attends religious service: Not on file    Active member of club or organization: Not on file    Attends meetings of clubs or organizations: Not on file    Relationship status: Not on file  . Intimate partner violence    Fear of current or ex partner: Not on file    Emotionally abused: Not on file    Physically abused: Not on file  Forced sexual activity: Not on file  Other Topics Concern  . Not on file  Social History Narrative   Married.  Lives with husband.  Independent of ADLs and with ambulation.     Physical Exam  Vital Signs and Nursing Notes reviewed Vitals:   12/06/18 1300 12/06/18 1330  BP: 103/69 (!) 103/59  Pulse: 67 61  Resp: 19 11  Temp:    SpO2: 92% 94%    CONSTITUTIONAL: Well-appearing, appears uncomfortable NEURO:  Alert and oriented x 3, no focal deficits EYES:  eyes equal and reactive ENT/NECK:  no LAD, no JVD CARDIO: Regular rate, well-perfused, normal S1 and S2 PULM:  CTAB no wheezing or rhonchi GI/GU:  normal bowel sounds, non-distended, non-tender MSK/SPINE:  No gross deformities, no edema SKIN:  no rash, atraumatic PSYCH:  Appropriate speech and behavior  Diagnostic and Interventional Summary    EKG Interpretation  Date/Time:  Thursday December 06 2018 08:26:06 EDT Ventricular Rate:  62 PR Interval:    QRS Duration: 97 QT Interval:  410 QTC Calculation: 417 R Axis:   31 Text Interpretation:  Sinus rhythm Low voltage,  precordial leads Borderline repolarization abnormality Confirmed by Gerlene Fee [phone]) on 12/06/2018 9:10:44 AM Also confirmed by Gerlene Fee [phone]), editor Philomena Doheny [phone])  on 12/06/2018 9:41:56 AM      Labs Reviewed  BASIC METABOLIC PANEL - Abnormal; Notable for the following components:      Result Value   Glucose, Bld 227 (*)    All other components within normal limits  CBC - Abnormal; Notable for the following components:   Hemoglobin 15.6 (*)    HCT 49.5 (*)    All other components within normal limits  TROPONIN I (HIGH SENSITIVITY)  TROPONIN I (HIGH SENSITIVITY)    DG Chest 2 View  Final Result      Medications  sodium chloride flush (NS) 0.9 % injection 3 mL (3 mLs Intravenous Not Given 12/06/18 0851)  aspirin chewable tablet 324 mg (324 mg Oral Given 12/06/18 0926)  oxyCODONE (Oxy IR/ROXICODONE) immediate release tablet 5 mg (5 mg Oral Given 12/06/18 0927)  nitroGLYCERIN (NITROSTAT) SL tablet 0.4 mg (0.4 mg Sublingual Given 12/06/18 0927)  nitroGLYCERIN (NITROSTAT) SL tablet 0.4 mg (0.4 mg Sublingual Given 12/06/18 1204)     Procedures Critical Care  ED Course and Medical Decision Making  I have reviewed the triage vital signs and the nursing notes.  Pertinent labs & imaging results that were available during my care of the patient were reviewed by me and considered in my medical decision making (see below for details).  Concern for cardiac chest pain in this 53 year old female history of diabetes, EKG with nonspecific changes, troponin is pending.  No evidence of DVT, no shortness of breath, no tachycardia, doubt pulmonary embolism.  High-sensitivity troponin is negative x2.  Patient's pain is largely resolved after medications listed above.  In fact, patient's pain returned briefly while in the emergency department, but then improved again after an additional nitro tablet.  This was initially concerning for possible cardiac chest pain.  However, upon repeat  evaluation and questioning, patient's pain has been consistently worse with laying flat and worse with meals.  Suspect esophageal spasm related to acid reflux especially given the negative troponins today.  However given patient's chest pain, age, risk factors she was advised to follow-up with primary care doctor or cardiologist to discuss need for outpatient stress testing within the next 1 to 2 weeks.  After the discussed management above,  the patient was determined to be safe for discharge.  The patient was in agreement with this plan and all questions regarding their care were answered.  ED return precautions were discussed and the patient will return to the ED with any significant worsening of condition.  Barth Kirks. Sedonia Small, [place] [email or web address]  Final Clinical Impressions(s) / ED Diagnoses     ICD-10-CM   1. Chest pain, unspecified type  R07.9     ED Discharge Orders         Ordered    omeprazole (PRILOSEC) 20 MG capsule  Daily     12/06/18 1348    sucralfate (CARAFATE) 1 g tablet  4 times daily PRN     12/06/18 1348             Maudie Flakes, MD 12/06/18 1350

## 2018-12-19 ENCOUNTER — Encounter: Payer: Self-pay | Admitting: Cardiology

## 2018-12-19 ENCOUNTER — Ambulatory Visit (INDEPENDENT_AMBULATORY_CARE_PROVIDER_SITE_OTHER): Payer: PRIVATE HEALTH INSURANCE | Admitting: Cardiology

## 2018-12-19 ENCOUNTER — Other Ambulatory Visit: Payer: Self-pay

## 2018-12-19 VITALS — BP 102/70 | HR 62 | Ht 69.0 in | Wt 247.7 lb

## 2018-12-19 DIAGNOSIS — I10 Essential (primary) hypertension: Secondary | ICD-10-CM | POA: Diagnosis not present

## 2018-12-19 DIAGNOSIS — K219 Gastro-esophageal reflux disease without esophagitis: Secondary | ICD-10-CM

## 2018-12-19 DIAGNOSIS — R079 Chest pain, unspecified: Secondary | ICD-10-CM

## 2018-12-19 DIAGNOSIS — Z0000 Encounter for general adult medical examination without abnormal findings: Secondary | ICD-10-CM

## 2018-12-19 DIAGNOSIS — E1142 Type 2 diabetes mellitus with diabetic polyneuropathy: Secondary | ICD-10-CM | POA: Diagnosis not present

## 2018-12-19 LAB — LIPID PANEL
Chol/HDL Ratio: 3.4 ratio (ref 0.0–4.4)
Cholesterol, Total: 224 mg/dL — ABNORMAL HIGH (ref 100–199)
HDL: 65 mg/dL (ref >39)
LDL Calculated: 137 mg/dL — ABNORMAL HIGH (ref 0–99)
Triglycerides: 112 mg/dL (ref 0–149)
VLDL Cholesterol Cal: 22 mg/dL (ref 5–40)

## 2018-12-19 NOTE — Progress Notes (Signed)
Cardiology Office Note:    Date:  12/19/2018   ID:  Veronica Rivers, DOB [birth year], MRN [id number]  PCP:  Leighton Ruff, MD  Cardiologist:  [name], MD  Referring MD: Leighton Ruff, MD   Chief Complaint  Patient presents with   Hospitalization Follow-up   Chest Pain    History of Present Illness:    Veronica Rivers is a 53 y.o. female with a past medical history significant for COPD and diabetes type 2.  Echocardiogram in 2019 showed normal LV systolic function with EF 55-60% with normal wall motion and grade 1 diastolic dysfunction. No prior stress testing.  The patient was seen at [facility] on 12/06/2018 for complaints of chest pain described as a sharp tightness with no associated symptoms.  EKG with nonspecific changes.  High-sensitivity troponin was negative x2.  Her symptoms seem to be worse with laying flat and worse with meals.  Esophageal spasm related to acid reflux was suspected.  The patient was last seen in the office by Dr. Johnsie Cancel on 03/08/2018 for follow-up of hypertension, elevated lipids and history of atypical chest pain and history of SVT.  She was doing well at that visit and was to follow-up in 1 year.    The patient is here today for further evaluation of her chest pain. She continues to have chest discomfort, usually with exertion, stress or certain things she eats. She has not really had much reflux in the past, only occ heart burn. Her discomfort is in the center along her sternum, like a weight, lasts a few minutes, the longest was a day and a half. The lengthy discomfort is why she went to the ED. Some pain is reproducible with pushing on a localized spot. She has had costochondritis and she feels that this pain is somewhat similar.   She takes care of her mother in law with dementia and all of her own family. She is trying to get back to exercise. She has been working out in the yard. She does not get substernal chest pain but she gets a  sorenss of her chest that she feels is related to her large breasts pulling. She can push on it and feel the soreness of her chest muscles. She does have some shortness of breath with more exertion and when muggy outside. This is releived with her albuterol inhaler and she relates it to her asthma. This has been her norm for several years.   She was given omeprazole and carafate at the ED. The omeprazole aggravates her asthma. She was taking pepcid only as needed prior to the ED. She had taken some Pepcid and gasx which helped a little.   She feels strongly that her episode was related to reflux.  Past Medical History:  Diagnosis Date   Asthma    Bipolar depression ([place])    Cellulitis    CHEST PAIN    DM    DYSPNEA    Emphysema of lung ([place])    HYPERCHOLESTEROLEMIA    HYPERLIPIDEMIA    HYPOTHYROIDISM    Neuropathy    SUPRAVENTRICULAR TACHYCARDIA     Past Surgical History:  Procedure Laterality Date   CESAREAN SECTION     FOOT SURGERY     NASAL SEPTUM SURGERY     NECK SURGERY     TONSILLECTOMY     TUBAL LIGATION      Current Medications: Current Meds  Medication Sig   albuterol (PROVENTIL HFA;VENTOLIN HFA) 108 (90 BASE)  MCG/ACT inhaler Inhale 1-2 puffs into the lungs every 4 (four) hours as needed for wheezing or shortness of breath.    ARIPiprazole (ABILIFY) 15 MG tablet Take 15 mg by mouth daily.   carvedilol (COREG) 12.5 MG tablet TAKE 1 TABLET TWICE A DAY WITH MEALS.   clonazePAM (KLONOPIN) 1 MG tablet Take 0.5-1 mg by mouth See admin instructions. Pt takes one half every morning PRN anxiety and one at bedtime every day   fluticasone (FLONASE) 50 MCG/ACT nasal spray Place 1 spray into both nostrils daily.    fluticasone furoate-vilanterol (BREO ELLIPTA) 200-25 MCG/INH AEPB Inhale 1 puff into the lungs daily.   gabapentin (NEURONTIN) 300 MG capsule Take 300 mg by mouth 2 (two) times daily.    hydrocortisone cream 0.5 % Apply 1 application  topically 2 (two) times daily.   JARDIANCE 25 MG TABS tablet Take 25 mg by mouth daily.    lamoTRIgine (LAMICTAL) 200 MG tablet Take 300 mg by mouth daily.   LANTUS SOLOSTAR 100 UNIT/ML Solostar Pen Inject 10 Units into the skin daily.   levocetirizine (XYZAL) 5 MG tablet Take 5 mg by mouth every evening.   levothyroxine (SYNTHROID, LEVOTHROID) 137 MCG tablet Take 137 mcg by mouth at bedtime.    montelukast (SINGULAIR) 10 MG tablet Take 1 tablet (10 mg total) by mouth at bedtime.   ONE TOUCH ULTRA TEST test strip Inject 1 strip into the skin daily.   Oxycodone HCl 10 MG TABS Take 10 mg by mouth every 6 (six) hours as needed for pain. for pain   PARoxetine (PAXIL) 20 MG tablet Take 1 tablet by mouth daily.   QUEtiapine (SEROQUEL) 400 MG tablet Take 400 mg by mouth at bedtime.   sucralfate (CARAFATE) 1 g tablet Take 1 tablet (1 g total) by mouth 4 (four) times daily as needed.   tiZANidine (ZANAFLEX) 4 MG tablet Take 4 mg by mouth as needed.     Allergies:   Meloxicam, Zocor [simvastatin], and Sulfa drugs cross reactors   Social History   Socioeconomic History   Marital status: Married    Spouse name: [place]   Number of children: 1   Years of education: 16   Highest education level: Not on file  Occupational History   Occupation: Unemployed  Scientist, product/process development strain: Not on file   Food insecurity    Worry: Not on file    Inability: Not on file   Transportation needs    Medical: Not on file    Non-medical: Not on file  Tobacco Use   Smoking status: Former Smoker    Years: 15.00    Types: Cigarettes    Quit date: 11/23/1997    Years since quitting: 21.0   Smokeless tobacco: Never Used  Substance and Sexual Activity   Alcohol use: No   Drug use: No   Sexual activity: Yes    Birth control/protection: Surgical  Lifestyle   Physical activity    Days per week: Not on file    Minutes per session: Not on file   Stress: Not on file    Relationships   Social connections    Talks on phone: Not on file    Gets together: Not on file    Attends religious service: Not on file    Active member of club or organization: Not on file    Attends meetings of clubs or organizations: Not on file    Relationship status: Not on file  Other  Topics Concern   Not on file  Social History Narrative   Married.  Lives with husband.  Independent of ADLs and with ambulation.     Family History: The patient's family history includes COPD in her mother; Cancer in her father; Heart attack in her mother; Heart disease in her mother; Hypertension in her mother. ROS:   Please see the history of present illness.     All other systems reviewed and are negative.  EKGs/Labs/Other Studies Reviewed:    The following studies were reviewed today:  Echocardiogram 12/11/2017 Study Conclusions - Left ventricle: The cavity size was normal. Wall thickness was   normal. Systolic function was normal. The estimated ejection   fraction was in the range of 55% to 60%. Wall motion was normal;   there were no regional wall motion abnormalities. Doppler   parameters are consistent with abnormal left ventricular   relaxation (grade 1 diastolic dysfunction).  Impressions: - Normal LV systolic function; mild diastolic dysfunction.  EKG: EKG from the ER reviewed normal sinus rhythm with nonspecific T changes-mostly similar to prior EKGs  Recent Labs: 12/06/2018: BUN 12; Creatinine, Ser 0.93; Hemoglobin 15.6; Platelets 251; Potassium 3.8; Sodium 139   Recent Lipid Panel    Component Value Date/Time   CHOL 160 07/02/2014 1029   TRIG 181.0 (H) 07/02/2014 1029   HDL 53.60 07/02/2014 1029   CHOLHDL 3 07/02/2014 1029   VLDL 36.2 07/02/2014 1029   LDLCALC 70 07/02/2014 1029    Physical Exam:    VS:  BP 102/70    Pulse 62    Ht 5\' 9"  (1.753 m)    Wt 247 lb 11.2 oz (112.4 kg)    SpO2 92%    BMI 36.58 kg/m     Wt Readings from Last 3 Encounters:   12/19/18 247 lb 11.2 oz (112.4 kg)  12/06/18 244 lb (110.7 kg)  04/19/18 241 lb (109.3 kg)     Physical Exam  Constitutional: She is oriented to person, place, and time. She appears well-developed and well-nourished. No distress.  HENT:  Head: Normocephalic and atraumatic.  Neck: Normal range of motion. Neck supple. No JVD present.  Cardiovascular: Normal rate, regular rhythm, normal heart sounds and intact distal pulses. Exam reveals no gallop and no friction rub.  No murmur heard. Pulmonary/Chest: Effort normal and breath sounds normal. No respiratory distress. She has no wheezes. She has no rales.  Tenderness to palpation in center of chest over sternum  Abdominal: Soft. Bowel sounds are normal.  Musculoskeletal: Normal range of motion.        General: No edema.  Neurological: She is alert and oriented to person, place, and time.  Skin: Skin is warm and dry.  Psychiatric: She has a normal mood and affect. Her behavior is normal. Judgment and thought content normal.  Vitals reviewed.   ASSESSMENT:    1. Chest pain, unspecified type   2. Essential hypertension   3. Gastroesophageal reflux disease, esophagitis presence not specified   4. Type 2 diabetes mellitus with diabetic polyneuropathy, without long-term current use of insulin ([place])   5. Wellness examination    PLAN:    In order of problems listed above:  Chest pain -Patient presented to the ED 12/06/2018 with complaints of chest pain.  She ruled out for MI by EKG and troponin.  Symptoms were felt to be possibly related to esophageal spasm with GERD. -CVD risk factors include DM, obesity, family history. She does not smoke. Cholesterol has not been  tested recently. -The patient's description of her symptoms is ranging between a chest heaviness and muscle soreness.  The fact that her enzymes were negative after a day and a half of prolonged chest pain is reassuring.  However, with her risk factors and no prior recent  ischemic testing, she and I together discussed a stress test and we both agree.  With her diabetes she may be having atypical symptoms for CAD. -Plan for [facility] -Patient states she has not had lipids checked in years.  Will check fasting lipid panel for risk stratification.  Hypertension -On carvedilol 12.5 mg twice daily, off lisinopril due to low BP -Blood pressure well controlled, on the low side.  GERD -Her symptoms may possibly be due to reflux.  Omeprazole aggravated her asthma.  She continues on Carafate provided by the ED.  She will try taking Pepcid on a regular basis.  She is planned for GI appointment on 8/31.  Diabetes type 2 -On Jardiance and metformin, just started on insulin yesterday by endocrinology -Followed by PCP -Her low-dose lisinopril was stopped due to low blood pressure.  Could consider adding this back for renal protection and decreasing her carvedilol dose.  Medication Adjustments/Labs and Tests Ordered: Current medicines are reviewed at length with the patient today.  Concerns regarding medicines are outlined above. Labs and tests ordered and medication changes are outlined in the patient instructions below:  Patient Instructions  Medication Instructions:  Your physician recommends that you continue on your current medications as directed. Please refer to the Current Medication list given to you today.  If you need a refill on your cardiac medications before your next appointment, please call your pharmacy.   Lab work: TODAY: LIPIDS  If you have labs (blood work) drawn today and your tests are completely normal, you will receive your results only by:  [place] (if you have MyChart) OR  A paper copy in the mail If you have any lab test that is abnormal or we need to change your treatment, we will call you to review the results.  Testing/Procedures: Your physician has requested that you have a lexiscan myoview. For further information  please visit [email or web address]. Please follow instruction sheet, as given.   Follow-Up: At [facility], you and your health needs are our priority.  As part of our continuing mission to provide you with exceptional heart care, we have created designated Provider Care Teams.  These Care Teams include your primary Cardiologist (physician) and Advanced Practice Providers (APPs -  Physician Assistants and Nurse Practitioners) who all work together to provide you with the care you need, when you need it. You will need a follow up appointment in 6 months.  Please call our office 2 months in advance to schedule this appointment.  You may see [name], MD or one of the following Advanced Practice Providers on your designated Care Team:   Truitt Merle, NP Cecilie Kicks, NP  Kathyrn Drown, NP  Any Other Special Instructions Will Be Listed Below (If Applicable).   Lifestyle Modifications to Prevent and Treat Heart Disease -Recommend heart healthy/Mediterranean diet, with whole grains, fruits, vegetables, fish, lean meats, nuts, olive oil and avocado oil.  -Limit salt intake to less than 2000 mg per day.  -Recommend moderate walking, starting slowly with a few minutes and working up to 3-5 times/week for 30-50 minutes each session. Aim for at least 150 minutes.week. Goal should be pace of 3 miles/hours, or walking 1.5 miles in 30 minutes -Recommend  avoidance of tobacco products. Avoid excess alcohol. -Keep blood pressure well controlled, ideally less than 130/80.    ========================================   Mediterranean Diet A Mediterranean diet refers to food and lifestyle choices that are based on the traditions of countries located on the [employer]. This way of eating has been shown to help prevent certain conditions and improve outcomes for people who have chronic diseases, like kidney disease and heart disease. What are tips for following this plan? Lifestyle  Cook and eat  meals together with your family, when possible.  Drink enough fluid to keep your urine clear or pale yellow.  Be physically active every day. This includes: ? Aerobic exercise like running or swimming. ? Leisure activities like gardening, walking, or housework.  Get 7-8 hours of sleep each night.  If recommended by your health care provider, drink red wine in moderation. This means 1 glass a day for nonpregnant women and 2 glasses a day for men. A glass of wine equals 5 oz (150 mL). Reading food labels   Check the serving size of packaged foods. For foods such as rice and pasta, the serving size refers to the amount of cooked product, not dry.  Check the total fat in packaged foods. Avoid foods that have saturated fat or trans fats.  Check the ingredients list for added sugars, such as corn syrup. Shopping  At the grocery store, buy most of your food from the areas near the walls of the store. This includes: ? Fresh fruits and vegetables (produce). ? Grains, beans, nuts, and seeds. Some of these may be available in unpackaged forms or large amounts (in bulk). ? Fresh seafood. ? Poultry and eggs. ? Low-fat dairy products.  Buy whole ingredients instead of prepackaged foods.  Buy fresh fruits and vegetables in-season from local farmers markets.  Buy frozen fruits and vegetables in resealable bags.  If you do not have access to quality fresh seafood, buy precooked frozen shrimp or canned fish, such as tuna, salmon, or sardines.  Buy small amounts of raw or cooked vegetables, salads, or olives from the deli or salad bar at your store.  Stock your pantry so you always have certain foods on hand, such as olive oil, canned tuna, canned tomatoes, rice, pasta, and beans. Cooking  Cook foods with extra-virgin olive oil instead of using butter or other vegetable oils.  Have meat as a side dish, and have vegetables or grains as your main dish. This means having meat in small portions  or adding small amounts of meat to foods like pasta or stew.  Use beans or vegetables instead of meat in common dishes like chili or lasagna.  Experiment with different cooking methods. Try roasting or broiling vegetables instead of steaming or sauteing them.  Add frozen vegetables to soups, stews, pasta, or rice.  Add nuts or seeds for added healthy fat at each meal. You can add these to yogurt, salads, or vegetable dishes.  Marinate fish or vegetables using olive oil, lemon juice, garlic, and fresh herbs. Meal planning   Plan to eat 1 vegetarian meal one day each week. Try to work up to 2 vegetarian meals, if possible.  Eat seafood 2 or more times a week.  Have healthy snacks readily available, such as: ? Vegetable sticks with hummus. ? [place] yogurt. ? Fruit and nut trail mix.  Eat balanced meals throughout the week. This includes: ? Fruit: 2-3 servings a day ? Vegetables: 4-5 servings a day ? Low-fat dairy: 2  servings a day ? Fish, poultry, or lean meat: 1 serving a day ? Beans and legumes: 2 or more servings a week ? Nuts and seeds: 1-2 servings a day ? Whole grains: 6-8 servings a day ? Extra-virgin olive oil: 3-4 servings a day  Limit red meat and sweets to only a few servings a month What are my food choices?  Mediterranean diet ? Recommended  Grains: Whole-grain pasta. Brown rice. Bulgar wheat. Polenta. Couscous. Whole-wheat bread. [place].  Vegetables: Artichokes. Beets. Broccoli. Cabbage. Carrots. Eggplant. Green beans. Chard. Kale. Spinach. Onions. Leeks. Peas. Squash. Tomatoes. Peppers. Radishes.  Fruits: Apples. Apricots. Avocado. Berries. Bananas. Cherries. Dates. Figs. Grapes. Lemons. Melon. Oranges. Peaches. Plums. Pomegranate.  Meats and other protein foods: Beans. Almonds. Sunflower seeds. Pine nuts. Peanuts. [place]. Salmon. Scallops. Shrimp. [place]. Tilapia. Clams. Oysters. Eggs.  Dairy: Low-fat milk. Cheese. Greek yogurt.  Beverages: Water. Red  wine. Herbal tea.  Fats and oils: Extra virgin olive oil. Avocado oil. Grape seed oil.  Sweets and desserts: [place] yogurt with honey. Baked apples. Poached pears. Trail mix.  Seasoning and other foods: Basil. Cilantro. Coriander. Cumin. Mint. Parsley. Sage. Rosemary. Tarragon. Garlic. Oregano. Thyme. Pepper. Balsalmic vinegar. Tahini. Hummus. Tomato sauce. Olives. Mushrooms. ? Limit these  Grains: Prepackaged pasta or rice dishes. Prepackaged cereal with added sugar.  Vegetables: Deep fried potatoes (french fries).  Fruits: Fruit canned in syrup.  Meats and other protein foods: Beef. Pork. Lamb. Poultry with skin. Hot dogs. Berniece Salines.  Dairy: Ice cream. Sour cream. Whole milk.  Beverages: Juice. Sugar-sweetened soft drinks. Beer. Liquor and spirits.  Fats and oils: Butter. Canola oil. Vegetable oil. Beef fat (tallow). Lard.  Sweets and desserts: Cookies. Cakes. Pies. Candy.  Seasoning and other foods: Mayonnaise. Premade sauces and marinades. The items listed may not be a complete list. Talk with your dietitian about what dietary choices are right for you. Summary  The Mediterranean diet includes both food and lifestyle choices.  Eat a variety of fresh fruits and vegetables, beans, nuts, seeds, and whole grains.  Limit the amount of red meat and sweets that you eat.  Talk with your health care provider about whether it is safe for you to drink red wine in moderation. This means 1 glass a day for nonpregnant women and 2 glasses a day for men. A glass of wine equals 5 oz (150 mL). This information is not intended to replace advice given to you by your health care provider. Make sure you discuss any questions you have with your health care provider. Document Released: 12/17/2015 Document Revised: 12/24/2015 Document Reviewed: 12/17/2015 Elsevier Patient Education  2020 [place], Daune Perch, NP  12/19/2018 12:32 PM    [place]

## 2018-12-19 NOTE — Patient Instructions (Addendum)
Medication Instructions:  Your physician recommends that you continue on your current medications as directed. Please refer to the Current Medication list given to you today.  If you need a refill on your cardiac medications before your next appointment, please call your pharmacy.   Lab work: TODAY: LIPIDS  If you have labs (blood work) drawn today and your tests are completely normal, you will receive your results only by: [name] MyChart Message (if you have MyChart) OR . A paper copy in the mail If you have any lab test that is abnormal or we need to change your treatment, we will call you to review the results.  Testing/Procedures: Your physician has requested that you have a lexiscan myoview. For further information please visit [email or web address]. Please follow instruction sheet, as given.   Follow-Up: At [facility], you and your health needs are our priority.  As part of our continuing mission to provide you with exceptional heart care, we have created designated Provider Care Teams.  These Care Teams include your primary Cardiologist (physician) and Advanced Practice Providers (APPs -  Physician Assistants and Nurse Practitioners) who all work together to provide you with the care you need, when you need it. You will need a follow up appointment in 6 months.  Please call our office 2 months in advance to schedule this appointment.  You may see [name], MD or one of the following Advanced Practice Providers on your designated Care Team:   Truitt Merle, NP Cecilie Kicks, NP . Kathyrn Drown, NP  Any Other Special Instructions Will Be Listed Below (If Applicable).   Lifestyle Modifications to Prevent and Treat Heart Disease -Recommend heart healthy/Mediterranean diet, with whole grains, fruits, vegetables, fish, lean meats, nuts, olive oil and avocado oil.  -Limit salt intake to less than 2000 mg per day.  -Recommend moderate walking, starting slowly with a few minutes and  working up to 3-5 times/week for 30-50 minutes each session. Aim for at least 150 minutes.week. Goal should be pace of 3 miles/hours, or walking 1.5 miles in 30 minutes -Recommend avoidance of tobacco products. Avoid excess alcohol. -Keep blood pressure well controlled, ideally less than 130/80.    ========================================   Mediterranean Diet A Mediterranean diet refers to food and lifestyle choices that are based on the traditions of countries located on the [employer]. This way of eating has been shown to help prevent certain conditions and improve outcomes for people who have chronic diseases, like kidney disease and heart disease. What are tips for following this plan? Lifestyle  Cook and eat meals together with your family, when possible.  Drink enough fluid to keep your urine clear or pale yellow.  Be physically active every day. This includes: ? Aerobic exercise like running or swimming. ? Leisure activities like gardening, walking, or housework.  Get 7-8 hours of sleep each night.  If recommended by your health care provider, drink red wine in moderation. This means 1 glass a day for nonpregnant women and 2 glasses a day for men. A glass of wine equals 5 oz (150 mL). Reading food labels   Check the serving size of packaged foods. For foods such as rice and pasta, the serving size refers to the amount of cooked product, not dry.  Check the total fat in packaged foods. Avoid foods that have saturated fat or trans fats.  Check the ingredients list for added sugars, such as corn syrup. Shopping  At the grocery store, buy most of your food from  the areas near the walls of the store. This includes: ? Fresh fruits and vegetables (produce). ? Grains, beans, nuts, and seeds. Some of these may be available in unpackaged forms or large amounts (in bulk). ? Fresh seafood. ? Poultry and eggs. ? Low-fat dairy products.  Buy whole ingredients instead of  prepackaged foods.  Buy fresh fruits and vegetables in-season from local farmers markets.  Buy frozen fruits and vegetables in resealable bags.  If you do not have access to quality fresh seafood, buy precooked frozen shrimp or canned fish, such as tuna, salmon, or sardines.  Buy small amounts of raw or cooked vegetables, salads, or olives from the deli or salad bar at your store.  Stock your pantry so you always have certain foods on hand, such as olive oil, canned tuna, canned tomatoes, rice, pasta, and beans. Cooking  Cook foods with extra-virgin olive oil instead of using butter or other vegetable oils.  Have meat as a side dish, and have vegetables or grains as your main dish. This means having meat in small portions or adding small amounts of meat to foods like pasta or stew.  Use beans or vegetables instead of meat in common dishes like chili or lasagna.  Experiment with different cooking methods. Try roasting or broiling vegetables instead of steaming or sauteing them.  Add frozen vegetables to soups, stews, pasta, or rice.  Add nuts or seeds for added healthy fat at each meal. You can add these to yogurt, salads, or vegetable dishes.  Marinate fish or vegetables using olive oil, lemon juice, garlic, and fresh herbs. Meal planning   Plan to eat 1 vegetarian meal one day each week. Try to work up to 2 vegetarian meals, if possible.  Eat seafood 2 or more times a week.  Have healthy snacks readily available, such as: ? Vegetable sticks with hummus. ? [place] yogurt. ? Fruit and nut trail mix.  Eat balanced meals throughout the week. This includes: ? Fruit: 2-3 servings a day ? Vegetables: 4-5 servings a day ? Low-fat dairy: 2 servings a day ? Fish, poultry, or lean meat: 1 serving a day ? Beans and legumes: 2 or more servings a week ? Nuts and seeds: 1-2 servings a day ? Whole grains: 6-8 servings a day ? Extra-virgin olive oil: 3-4 servings a day  Limit red meat  and sweets to only a few servings a month What are my food choices?  Mediterranean diet ? Recommended  Grains: Whole-grain pasta. Brown rice. Bulgar wheat. Polenta. Couscous. Whole-wheat bread. [place].  Vegetables: Artichokes. Beets. Broccoli. Cabbage. Carrots. Eggplant. Green beans. Chard. Kale. Spinach. Onions. Leeks. Peas. Squash. Tomatoes. Peppers. Radishes.  Fruits: Apples. Apricots. Avocado. Berries. Bananas. Cherries. Dates. Figs. Grapes. Lemons. Melon. Oranges. Peaches. Plums. Pomegranate.  Meats and other protein foods: Beans. Almonds. Sunflower seeds. Pine nuts. Peanuts. [place]. Salmon. Scallops. Shrimp. [place]. Tilapia. Clams. Oysters. Eggs.  Dairy: Low-fat milk. Cheese. Greek yogurt.  Beverages: Water. Red wine. Herbal tea.  Fats and oils: Extra virgin olive oil. Avocado oil. Grape seed oil.  Sweets and desserts: [place] yogurt with honey. Baked apples. Poached pears. Trail mix.  Seasoning and other foods: Basil. Cilantro. Coriander. Cumin. Mint. Parsley. Sage. Rosemary. Tarragon. Garlic. Oregano. Thyme. Pepper. Balsalmic vinegar. Tahini. Hummus. Tomato sauce. Olives. Mushrooms. ? Limit these  Grains: Prepackaged pasta or rice dishes. Prepackaged cereal with added sugar.  Vegetables: Deep fried potatoes (french fries).  Fruits: Fruit canned in syrup.  Meats and other protein foods: Beef. Pork.  Marya Amsler with skin. Hot dogs. Berniece Salines.  Dairy: Ice cream. Sour cream. Whole milk.  Beverages: Juice. Sugar-sweetened soft drinks. Beer. Liquor and spirits.  Fats and oils: Butter. Canola oil. Vegetable oil. Beef fat (tallow). Lard.  Sweets and desserts: Cookies. Cakes. Pies. Candy.  Seasoning and other foods: Mayonnaise. Premade sauces and marinades. The items listed may not be a complete list. Talk with your dietitian about what dietary choices are right for you. Summary  The Mediterranean diet includes both food and lifestyle choices.  Eat a variety of fresh  fruits and vegetables, beans, nuts, seeds, and whole grains.  Limit the amount of red meat and sweets that you eat.  Talk with your health care provider about whether it is safe for you to drink red wine in moderation. This means 1 glass a day for nonpregnant women and 2 glasses a day for men. A glass of wine equals 5 oz (150 mL). This information is not intended to replace advice given to you by your health care provider. Make sure you discuss any questions you have with your health care provider. Document Released: 12/17/2015 Document Revised: 12/24/2015 Document Reviewed: 12/17/2015 Elsevier Patient Education  2020 [employer].

## 2018-12-24 ENCOUNTER — Telehealth: Payer: Self-pay

## 2018-12-24 DIAGNOSIS — E785 Hyperlipidemia, unspecified: Secondary | ICD-10-CM

## 2018-12-24 MED ORDER — ROSUVASTATIN CALCIUM 10 MG PO TABS: 10.0000 mg | ORAL_TABLET | Freq: Every day | ORAL | 3 refills | Status: DC

## 2018-12-24 NOTE — Telephone Encounter (Signed)
-----   Message from Daune Perch, NP sent at 12/23/2018  2:33 PM EDT ----- According to current guidelines people with diabetes should be taking a statin to reduce cardiovascular risk. Cholesterol is elevated and I think it would be wise to re try statin therapy. I see that simvastatin was not tolerated. We can try a different one. Crestor is often better tolerated. Could start at 10 mg daily if patient agrees. If so, repeat LFT and FLP in 3 months. Advise on Lifestyle Modifications including heart healthy/Mediterranean diet, with whole grains, fruits, vegetables, fish, lean meats, nuts, olive oil and avocado oil as well as increasing physical activity. (info was printed out on her AVS and discussed at office visit).     Daune Perch, NP

## 2018-12-24 NOTE — Telephone Encounter (Signed)
Pt aware of results. Pt is agreeable to starting Crestor 10 mg once a day. Will recheck labs on 11/17.

## 2018-12-26 ENCOUNTER — Telehealth (HOSPITAL_COMMUNITY): Payer: Self-pay

## 2018-12-26 NOTE — Telephone Encounter (Signed)
Spoke with the patient, instructions were given. She stated that she understood and would be here. Asked to call back with any questions. S.Irena Gaydos EMTP

## 2018-12-27 ENCOUNTER — Ambulatory Visit (HOSPITAL_COMMUNITY): Payer: PRIVATE HEALTH INSURANCE

## 2018-12-28 ENCOUNTER — Ambulatory Visit (HOSPITAL_COMMUNITY): Payer: PRIVATE HEALTH INSURANCE

## 2019-01-22 ENCOUNTER — Telehealth: Payer: Self-pay | Admitting: *Deleted

## 2019-01-22 NOTE — Telephone Encounter (Signed)
   [facility] Pre-operative Risk Assessment    Request for surgical clearance:  1. What type of surgery is being performed? COLONOSCOPY/ENDOSCOPY   2. When is this surgery scheduled? TBD; STATES November THOUGH DATE HAS BEEN SENT YET PER FAX   3. What type of clearance is required (medical clearance vs. Pharmacy clearance to hold med vs. Both)? MEDICAL  4. Are there any medications that need to be held prior to surgery and how long? NONE LISTED    5. Practice name and name of physician performing surgery?  [facility]; DR. Alessandra Bevels   6. What is your office phone number [phone]    7.   What is your office fax number [phone]  8.   Anesthesia type (None, local, MAC, general) ? PROPOFOL    Veronica Rivers 01/22/2019, 10:47 AM  _________________________________________________________________   (provider comments below)

## 2019-01-23 NOTE — Telephone Encounter (Signed)
   Cardiologist:  [name], MD   Reviewed chart.  Medical Problems  SVT  Chest pain   Echocardiogram 8/19: EF 55-60, Gr 1 DD  Diabetes mellitus 2 (on Insulin)  Hyperlipidemia   Hypothyroidism   Asthma/COPD  Depression  Last OV: Daune Perch, NP 12/19/2018  RCRI: 0.9% (low risk for periop MACE).  Myoview scheduled for 01/30/2019.    Will need to follow up on Myoview results then call patient. Richardson Dopp, PA-C    01/23/2019 9:58 AM

## 2019-01-24 ENCOUNTER — Telehealth (HOSPITAL_COMMUNITY): Payer: Self-pay

## 2019-01-24 NOTE — Telephone Encounter (Signed)
Spoke with the patient, instructions given. She stated that she would be here for here test. Asked to call back with any questions. Veronica Rivers EMTP

## 2019-01-29 ENCOUNTER — Other Ambulatory Visit: Payer: Self-pay | Admitting: Pulmonary Disease

## 2019-01-29 ENCOUNTER — Ambulatory Visit (HOSPITAL_COMMUNITY): Payer: PRIVATE HEALTH INSURANCE

## 2019-01-30 ENCOUNTER — Ambulatory Visit (HOSPITAL_COMMUNITY): Payer: PRIVATE HEALTH INSURANCE

## 2019-01-31 NOTE — Telephone Encounter (Signed)
Spoke to pt and informed of recommendations. Pt verbalized she would like to reschedule stress test and scheduled f/u appt with Melina Copa, PA on 10/6 at 11:45 AM. Explained to pt that I will have someone call to schedule her stress test. Pt verbalized thanks.

## 2019-01-31 NOTE — Telephone Encounter (Signed)
   Primary Cardiologist: [name], MD  Chart reviewed as part of pre-operative protocol coverage. She was seen 12/19/18 for evaluation of chest pain and stress test was recommended. It appears this patient canceled her nuclear stress test that was scheduled for 01/30/19. Please let her know that we will be unable to provide clearance for her colonoscopy until we have the results of her stress test.    Pre-op covering staff: - Please contact requesting surgeon's office via preferred method (i.e, phone, fax) to inform them of need for appointment prior to surgery.   I will keep in preop pool should she reschedule her stress test.    Ledora Bottcher, PA 01/31/2019, 11:18 AM

## 2019-02-04 NOTE — Telephone Encounter (Signed)
[name], Abram Sander        I have called the patient and left a VM for her to call and schedule it.   Previous Messages  ----- Message -----  From: Therisa Doyne  Sent: 01/31/2019  1:46 PM EDT  To: Cv Div Nl Scheduling  Subject: Stress Test                    Please call pt to schedule stress test. Pt is scheduled to see Melina Copa, PA at [facility] office on 10/6. Can stress test be done prior to this?   Thanks,  Lovena Le, CMA

## 2019-02-04 NOTE — Telephone Encounter (Signed)
Routing to preop pool and to triage to call and get her stress test rescheduled, prior to her appt 10/6 with Melina Copa for clearance.

## 2019-02-10 NOTE — Progress Notes (Deleted)
Cardiology Office Note:    Date:  02/10/2019   ID:  Veronica Rivers, DOB 1965-10-20, MRN [id number]  PCP:  Leighton Ruff, MD  Cardiologist:  [name], MD  Electrophysiologist:  None   Referring MD: Leighton Ruff, MD   Chief Complaint: follow-up of chest pain and pre-operative evaluation ***  History of Present Illness:    Veronica Rivers is a 53 y.o. female with a history of SVT, hypertension, hyperlipidemia, type 2 diabetes mellitus, hypothyroidism, COPD who is followed by Dr. Johnsie Cancel and presents today for ***.  Patient has been followed by Dr. Johnsie Cancel for several years. She has a history of atypical chest pain and SVT. It does not look like she has ever had an ischemic evaluation ***. Last Echo from 12/2017 showed LVEF of 55-60% with normal wall motion and grade 1 diastolic dysfunction. She was last seen by Dr. Johnsie Cancel in 02/2018 at which time she was doing well. She presented to the [facility] ED in 11/2018 for evaluation of intermittent sharp central chest pain. Pain worse when laying flat and worse with meals. EKG showed non-specific changes and high-sensitivity troponin negative x2. Felt to be possible esophageal spasms. Patient was discharged with instructions to follow-up with Cardiologist. Patient was seen by Pecolia Ades, NP, on 12/19/2018. She reported continued chest discomfort with exertion, stress, and eating certain things. Pain is also somewhat reproducible. Patient felt strongly that her episodes of chest discomfort were related to her reflux. Lexiscan Myoview was ordered for further evaluation. However, it does not look like this has been done yet.   Patient presents today for follow-up of chest pain and pre-operative evaluation. ***  Pre-Operative Evaluation  - Patient has upcoming colonoscopy/endoscopy planned. - Per Revised Cardiac Risk Index, procedure considered very low risk with 0.4% chance of MACE.   Chest Pain  History of SVT  Hypertension  Hyperlipidemia  - Lipid panel from 12/19/2018: Total Cholesterol 224, Triglycerides 112, HDL 65, LDL 137.  - Crestor 10mg  daily started at that time. - Plan is for repeat lipid panel and LFTs next month.   Type 2 Diabetes Mellitus  - Management per primary team.   Past Medical History:  Diagnosis Date  . Asthma   . Bipolar depression ([place])   . Cellulitis   . CHEST PAIN   . DM   . DYSPNEA   . Emphysema of lung ([place])   . HYPERCHOLESTEROLEMIA   . HYPERLIPIDEMIA   . HYPOTHYROIDISM   . Neuropathy   . SUPRAVENTRICULAR TACHYCARDIA     Past Surgical History:  Procedure Laterality Date  . CESAREAN SECTION    . FOOT SURGERY    . NASAL SEPTUM SURGERY    . NECK SURGERY    . TONSILLECTOMY    . TUBAL LIGATION      Current Medications: No outpatient medications have been marked as taking for the 02/12/19 encounter (Appointment) with [name], PA-C.     Allergies:   Meloxicam, Zocor [simvastatin], and Sulfa drugs cross reactors   Social History   Socioeconomic History  . Marital status: Married    Spouse name: [place]  . Number of children: 1  . Years of education: 93  . Highest education level: Not on file  Occupational History  . Occupation: Unemployed  Social Needs  . Financial resource strain: Not on file  . Food insecurity    Worry: Not on file    Inability: Not on file  . Transportation needs    Medical: Not  on file    Non-medical: Not on file  Tobacco Use  . Smoking status: Former Smoker    Years: 15.00    Types: Cigarettes    Quit date: 11/23/1997    Years since quitting: 21.2  . Smokeless tobacco: Never Used  Substance and Sexual Activity  . Alcohol use: No  . Drug use: No  . Sexual activity: Yes    Birth control/protection: Surgical  Lifestyle  . Physical activity    Days per week: Not on file    Minutes per session: Not on file  . Stress: Not on file  Relationships  . Social Herbalist on phone: Not on file    Gets together: Not on file    Attends  religious service: Not on file    Active member of club or organization: Not on file    Attends meetings of clubs or organizations: Not on file    Relationship status: Not on file  Other Topics Concern  . Not on file  Social History Narrative   Married.  Lives with husband.  Independent of ADLs and with ambulation.     Family History: The patient's ***family history includes COPD in her mother; Cancer in her father; Heart attack in her mother; Heart disease in her mother; Hypertension in her mother.  ROS:   Please see the history of present illness.    *** All other systems reviewed and are negative.  EKGs/Labs/Other Studies Reviewed:    The following studies were reviewed today:  Echocardiogram 12/11/2017: Study Conclusions: - Left ventricle: The cavity size was normal. Wall thickness was   normal. Systolic function was normal. The estimated ejection   fraction was in the range of 55% to 60%. Wall motion was normal;   there were no regional wall motion abnormalities. Doppler   parameters are consistent with abnormal left ventricular   relaxation (grade 1 diastolic dysfunction).  Impressions: - Normal LV systolic function; mild diastolic dysfunction.  EKG:  EKG is *** ordered today.  The ekg ordered today demonstrates ***  Recent Labs: 12/06/2018: BUN 12; Creatinine, Ser 0.93; Hemoglobin 15.6; Platelets 251; Potassium 3.8; Sodium 139  Recent Lipid Panel    Component Value Date/Time   CHOL 224 (H) 12/19/2018 1029   TRIG 112 12/19/2018 1029   HDL 65 12/19/2018 1029   CHOLHDL 3.4 12/19/2018 1029   CHOLHDL 3 07/02/2014 1029   VLDL 36.2 07/02/2014 1029   LDLCALC 137 (H) 12/19/2018 1029    Physical Exam:    VS:  There were no vitals taken for this visit.    Wt Readings from Last 3 Encounters:  12/19/18 247 lb 11.2 oz (112.4 kg)  12/06/18 244 lb (110.7 kg)  04/19/18 241 lb (109.3 kg)     GEN: *** Well nourished, well developed in no acute distress HEENT: Normal  NECK: No JVD; No carotid bruits LYMPHATICS: No lymphadenopathy CARDIAC: ***RRR, no murmurs, rubs, gallops RESPIRATORY:  Clear to auscultation without rales, wheezing or rhonchi  ABDOMEN: Soft, non-tender, non-distended MUSCULOSKELETAL:  No edema; No deformity  SKIN: Warm and dry NEUROLOGIC:  Alert and oriented x 3 PSYCHIATRIC:  Normal affect   ASSESSMENT:    No diagnosis found. PLAN:    In order of problems listed above:  1. ***   Medication Adjustments/Labs and Tests Ordered: Current medicines are reviewed at length with the patient today.  Concerns regarding medicines are outlined above.  No orders of the defined types were placed in this encounter.  No orders of the defined types were placed in this encounter.   There are no Patient Instructions on file for this visit.   Signed, Darreld Mclean, PA-C  02/10/2019 4:22 PM    [place]

## 2019-02-12 ENCOUNTER — Ambulatory Visit: Payer: PRIVATE HEALTH INSURANCE | Admitting: Physician Assistant

## 2019-02-13 NOTE — Telephone Encounter (Signed)
Looks like pt cancelled her Myoview and was a no show for appt with Melina Copa, PAC.

## 2019-02-21 NOTE — Progress Notes (Signed)
CARDIOLOGY OFFICE NOTE  Date:  02/25/2019    Veronica Rivers Date of Birth: [birth year] Medical Record [id number]  PCP:  Leighton Ruff, MD  Cardiologist:  Johnsie Cancel    Chief Complaint  Patient presents with  . Pre-op Exam    Seen for Dr. Johnsie Cancel    History of Present Illness: Veronica Rivers is a 53 y.o. female who presents today for a pre op clearance. Seen for Dr. Johnsie Cancel.   She has a history of HLD, SVT, obesity, COPD and type 2 DM. No known CAD. Echo in 2019 with normal EF. In the hospital back in July with atypical chest pain - felt to be GI related. Has had costochondritis in the past also. Was to have stress testing - this was never completed - she cancelled along with her follow up here.   Last seen here in August following her admission. Lots of symptoms reported - still endorsed that included chest pain - in the setting of multiple CV risk factors. The stress test was ordered.   Now needing clearance for EGD/colonoscopy which has not been given in light of stress testing being previously recommended.   The patient does not have symptoms concerning for COVID-19 infection (fever, chills, cough, or new shortness of breath).   Comes in today. Here alone. Asking for clearance. Still with chest pain and shortness of breath. Says this is all the same since July and her last visit here.  Her dyspnea is with and without exertion. Her chest pain comes and goes - with and without exertion - some palpable pain but also not. Says it is no different from her hospitalization back in July. She is now on insulin. Her weight continues to climb. She is a former smoker. She notes more swelling in her legs and now with pain behind the left knee over the past week - she is worried she has a blood clot. No travel, long car rides. She feels like her left leg is "tight" and sometimes warm to touch. She has been on antibiotics for a sinus infection recently.  Wonders if she needs a diuretic. Most meals  are take out - certainly getting too much salt.   Past Medical History:  Diagnosis Date  . Asthma   . Bipolar depression ([place])   . Cellulitis   . CHEST PAIN   . DM   . DYSPNEA   . Emphysema of lung ([place])   . HYPERCHOLESTEROLEMIA   . HYPERLIPIDEMIA   . HYPOTHYROIDISM   . Neuropathy   . SUPRAVENTRICULAR TACHYCARDIA     Past Surgical History:  Procedure Laterality Date  . CESAREAN SECTION    . FOOT SURGERY    . NASAL SEPTUM SURGERY    . NECK SURGERY    . TONSILLECTOMY    . TUBAL LIGATION       Medications: Current Meds  Medication Sig  . albuterol (PROVENTIL HFA;VENTOLIN HFA) 108 (90 BASE) MCG/ACT inhaler Inhale 1-2 puffs into the lungs every 4 (four) hours as needed for wheezing or shortness of breath.   . ARIPiprazole (ABILIFY) 15 MG tablet Take 15 mg by mouth daily.  . carvedilol (COREG) 12.5 MG tablet TAKE 1 TABLET TWICE A DAY WITH MEALS.  . clonazePAM (KLONOPIN) 1 MG tablet Take 0.5-1 mg by mouth See admin instructions. Pt takes one half every morning PRN anxiety and one at bedtime every day  . fluticasone (FLONASE) 50 MCG/ACT nasal spray Place 1 spray into both nostrils daily.   [name]  fluticasone furoate-vilanterol (BREO ELLIPTA) 200-25 MCG/INH AEPB Inhale 1 puff into the lungs daily.  [name] gabapentin (NEURONTIN) 300 MG capsule Take 300 mg by mouth 2 (two) times daily.   . hydrocortisone cream 0.5 % Apply 1 application topically 2 (two) times daily.  [name] JARDIANCE 25 MG TABS tablet Take 25 mg by mouth daily.   [name] lamoTRIgine (LAMICTAL) 200 MG tablet Take 300 mg by mouth daily.  [name] LANTUS SOLOSTAR 100 UNIT/ML Solostar Pen Inject 10 Units into the skin daily.  [name] levocetirizine (XYZAL) 5 MG tablet Take 5 mg by mouth every evening.  [name] levothyroxine (SYNTHROID, LEVOTHROID) 137 MCG tablet Take 137 mcg by mouth at bedtime.   . montelukast (SINGULAIR) 10 MG tablet TAKE 1 TABLET AT BEDTIME  . ONE TOUCH ULTRA TEST test strip Inject 1 strip into the skin daily.  . Oxycodone HCl 10 MG TABS  Take 10 mg by mouth every 6 (six) hours as needed for pain. for pain  . PARoxetine (PAXIL) 20 MG tablet Take 1 tablet by mouth daily.  . QUEtiapine (SEROQUEL) 400 MG tablet Take 400 mg by mouth at bedtime.  . rosuvastatin (CRESTOR) 10 MG tablet Take 1 tablet (10 mg total) by mouth daily.  . sucralfate (CARAFATE) 1 g tablet Take 1 tablet (1 g total) by mouth 4 (four) times daily as needed.  [name] tiZANidine (ZANAFLEX) 4 MG tablet Take 4 mg by mouth as needed.     Allergies: Allergies  Allergen Reactions  . Meloxicam Other (See Comments)    Pt stated, "Got Burning, Acid Reflux with medicine" Pt should not be on any anti-inflammatories d/t elevated LFTs  . Zocor [Simvastatin] Other (See Comments)    Caused muscle pain  . Sulfa Drugs Cross Reactors Rash    Social History: The patient  reports that she quit smoking about 21 years ago. Her smoking use included cigarettes. She quit after 15.00 years of use. She has never used smokeless tobacco. She reports that she does not drink alcohol or use drugs.   Family History: The patient's family history includes COPD in her mother; Cancer in her father; Heart attack in her mother; Heart disease in her mother; Hypertension in her mother.   Review of Systems: Please see the history of present illness.   All other systems are reviewed and negative.   Physical Exam: VS:  BP 124/70   Pulse 71   Ht 5\' 9"  (1.753 m)   Wt 256 lb (116.1 kg)   SpO2 94%   BMI 37.80 kg/m  .  BMI Body mass index is 37.8 kg/m.  Wt Readings from Last 3 Encounters:  02/25/19 256 lb (116.1 kg)  12/19/18 247 lb 11.2 oz (112.4 kg)  12/06/18 244 lb (110.7 kg)    General: Alert and in no acute distress. She is obese. Her weight is up 9 pounds since last visit here.    HEENT: Normal.  Neck: Supple, no JVD, carotid bruits, or masses noted.  Cardiac: Regular rate and rhythm. No murmurs, rubs, or gallops. +1 bilateral edema. Legs do not appear red or hot.  Respiratory:  Lungs  are clear to auscultation bilaterally with normal work of breathing.  GI: Soft and nontender.  MS: No deformity or atrophy. Gait and ROM intact.  Skin: Warm and dry. Color is normal.  Neuro:  Strength and sensation are intact and no gross focal deficits noted.  Psych: Alert, appropriate and with normal affect.   LABORATORY DATA:  EKG:  EKG is ordered  today. This demonstrates NSR with septal Q's and diffuse ST/T wave changes - not new.  Lab Results  Component Value Date   WBC 5.9 12/06/2018   HGB 15.6 (H) 12/06/2018   HCT 49.5 (H) 12/06/2018   PLT 251 12/06/2018   GLUCOSE 227 (H) 12/06/2018   CHOL 224 (H) 12/19/2018   TRIG 112 12/19/2018   HDL 65 12/19/2018   LDLCALC 137 (H) 12/19/2018   ALT 17 11/01/2017   AST 16 11/01/2017   NA 139 12/06/2018   K 3.8 12/06/2018   CL 101 12/06/2018   CREATININE 0.93 12/06/2018   BUN 12 12/06/2018   CO2 29 12/06/2018   TSH 0.39 08/07/2013   HGBA1C 8.0 (H) 11/23/2012     BNP (last 3 results) No results for input(s): BNP in the last 8760 hours.  ProBNP (last 3 results) No results for input(s): PROBNP in the last 8760 hours.   Other Studies Reviewed Today:  Echocardiogram 12/11/2017 Study Conclusions - Left ventricle: The cavity size was normal. Wall thickness was normal. Systolic function was normal. The estimated ejection fraction was in the range of 55% to 60%. Wall motion was normal; there were no regional wall motion abnormalities. Doppler parameters are consistent with abnormal left ventricular relaxation (grade 1 diastolic dysfunction).  Impressions: - Normal LV systolic function; mild diastolic dysfunction.  Assessment/Plan:  1. Pre op clearance - will need stress testing as previously indicated. 2 day Lexiscan ordered.   2. Chest pain - with multiple CV risk factors - along with abnormal EKG - stress testing has already been recommended - will get this arranged/rescheduled today.   3. Swelling - most likely  from excessive salt - but with her complaint of shortness of breath - will check d dimer - if elevated she will need CT scan of the chest to rule out PE. Would hold on diuretic therapy for now.   4. GERD - needing EGD at some point.   5. DM - per PCP - now on insulin - she is not able to tell me her current A1C - having labs with Endocrine tomorrow.   6.  HLD - on statin  7. COPD/asthma/emphysema - prior smoker.   8. COVID-19 Education: The signs and symptoms of COVID-19 were discussed with the patient and how to seek care for testing (follow up with PCP or arrange E-visit).  The importance of social distancing, staying at home, hand hygiene and wearing a mask when out in public were discussed today.  Current medicines are reviewed with the patient today.  The patient does not have concerns regarding medicines other than what has been noted above.  The following changes have been made:  See above.  Labs/ tests ordered today include:    Orders Placed This Encounter  Procedures  . D-Dimer, Quantitative  . MYOCARDIAL PERFUSION IMAGING  . EKG 12-Lead     Disposition:   Further disposition to follow.   Patient is agreeable to this plan and will call if any problems develop in the interim.   SignedTruitt Merle, NP  02/25/2019 4:16 PM  [facility] [address] [place], [place]  [address] Phone: [phone] Fax: [phone]

## 2019-02-25 ENCOUNTER — Encounter: Payer: Self-pay | Admitting: Nurse Practitioner

## 2019-02-25 ENCOUNTER — Ambulatory Visit: Payer: PRIVATE HEALTH INSURANCE | Admitting: Nurse Practitioner

## 2019-02-25 ENCOUNTER — Other Ambulatory Visit: Payer: Self-pay

## 2019-02-25 VITALS — BP 124/70 | HR 71 | Ht 69.0 in | Wt 256.0 lb

## 2019-02-25 DIAGNOSIS — R079 Chest pain, unspecified: Secondary | ICD-10-CM | POA: Diagnosis not present

## 2019-02-25 DIAGNOSIS — R0600 Dyspnea, unspecified: Secondary | ICD-10-CM

## 2019-02-25 DIAGNOSIS — I259 Chronic ischemic heart disease, unspecified: Secondary | ICD-10-CM

## 2019-02-25 LAB — D-DIMER, QUANTITATIVE: D-DIMER: 0.53 mg/L FEU — ABNORMAL HIGH (ref 0.00–0.49)

## 2019-02-25 NOTE — Patient Instructions (Addendum)
After Visit Summary:  We will be checking the following labs today - STAT D Dimer - if this comes back + you will have to go and get a CT scan to rule out blood clots in your lungs   Medication Instructions:    Continue with your current medicines.    If you need a refill on your cardiac medications before your next appointment, please call your pharmacy.     Testing/Procedures To Be Arranged:  Lexiscan Myoview  Follow-Up:   Will see how your stress test turns out.     At [facility], you and your health needs are our priority.  As part of our continuing mission to provide you with exceptional heart care, we have created designated Provider Care Teams.  These Care Teams include your primary Cardiologist (physician) and Advanced Practice Providers (APPs -  Physician Assistants and Nurse Practitioners) who all work together to provide you with the care you need, when you need it.  Special Instructions:  . Stay safe, stay home, wash your hands for at least 20 seconds and wear a mask when out in public.  . It was good to talk with you today. . Stop the take out - this should help your swelling improve with less salt.       You are scheduled for a Myocardial Perfusion Imaging Study on _______________________________ at ________________________________________________.   Please arrive 15 minutes prior to your appointment time for registration and insurance purposes.   The test will take approximately 3 to 4 hours to complete; you may bring reading material. If someone comes with you to your appointment, they will need to remain in the main lobby due to limited space in the testing area.   If you are pregnant or breastfeeding, please notify the nuclear lab prior to your appointment.   How to prepare for your Myocardial Perfusion test:   Do not eat or drink 3 hours prior to your test, except you may have water.    Do not consume products containing caffeine (regular or  decaffeinated) 12 hours prior to your test (ex: coffee, chocolate, soda, tea)   Do bring a list of your current medications with you. If not listed below, you may take your medications as normal.    Bring any held medication to your appointment, as you may be required to take it once the test is complete.   Do wear comfortable clothes (no dresses or overalls) and walking shoes. Tennis shoes are preferred. No heels or open toed shoes.  Do not wear cologne, perfume, aftershave or lotions (deodorant is allowed).   If these instructions are not followed, you test will have to be rescheduled.   Please report to [address] for your test. If you have questions or concerns about your appointment, please call the Nuclear Lab at [phone].  If you cannot keep your appointment, please provide 24 hour notification to the Nuclear lab to avoid a possible $50 charge to your account.     Call the [place] office at [phone] if you have any questions, problems or concerns.

## 2019-02-26 ENCOUNTER — Ambulatory Visit (INDEPENDENT_AMBULATORY_CARE_PROVIDER_SITE_OTHER)
Admission: RE | Admit: 2019-02-26 | Discharge: 2019-02-26 | Disposition: A | Discharge status: Home / Self Care | Payer: PRIVATE HEALTH INSURANCE | Source: Ambulatory Visit | Attending: Nurse Practitioner | Admitting: Nurse Practitioner

## 2019-02-26 ENCOUNTER — Other Ambulatory Visit: Payer: Self-pay | Admitting: *Deleted

## 2019-02-26 ENCOUNTER — Other Ambulatory Visit: Payer: PRIVATE HEALTH INSURANCE

## 2019-02-26 ENCOUNTER — Other Ambulatory Visit: Payer: Self-pay | Admitting: Nurse Practitioner

## 2019-02-26 DIAGNOSIS — R0602 Shortness of breath: Secondary | ICD-10-CM

## 2019-02-26 LAB — BASIC METABOLIC PANEL
BUN/Creatinine Ratio: 17 (ref 9–23)
BUN: 13 mg/dL (ref 6–24)
CO2: 30 mmol/L — ABNORMAL HIGH (ref 20–29)
Calcium: 10.1 mg/dL (ref 8.7–10.2)
Chloride: 105 mmol/L (ref 96–106)
Creatinine, Ser: 0.77 mg/dL (ref 0.57–1.00)
GFR calc Af Amer: 102 mL/min/{1.73_m2} (ref >59)
GFR calc non Af Amer: 88 mL/min/{1.73_m2} (ref >59)
Glucose: 172 mg/dL — ABNORMAL HIGH (ref 65–99)
Potassium: 4.3 mmol/L (ref 3.5–5.2)
Sodium: 139 mmol/L (ref 134–144)

## 2019-02-26 MED ORDER — IOHEXOL 350 MG/ML SOLN
80.0000 mL | Freq: Once | INTRAVENOUS | Status: AC | PRN
  Administered 2019-02-26: 11:00:00 80 mL via INTRAVENOUS

## 2019-02-27 ENCOUNTER — Telehealth (HOSPITAL_COMMUNITY): Payer: Self-pay | Admitting: *Deleted

## 2019-02-27 ENCOUNTER — Encounter (HOSPITAL_COMMUNITY): Payer: Self-pay | Admitting: *Deleted

## 2019-02-27 NOTE — Telephone Encounter (Signed)
Patient given detailed instructions per Myocardial Perfusion Study Information Sheet for the test on 03/04/2019 at 1315. Patient notified to arrive 15 minutes early and that it is imperative to arrive on time for appointment to keep from having the test rescheduled.  If you need to cancel or reschedule your appointment, please call the office within 24 hours of your appointment. . Patient verbalized understanding.Marthe Dant, Ranae Palms My chart letter sent

## 2019-02-28 ENCOUNTER — Encounter (HOSPITAL_COMMUNITY): Payer: PRIVATE HEALTH INSURANCE

## 2019-03-04 ENCOUNTER — Ambulatory Visit (HOSPITAL_COMMUNITY): Payer: PRIVATE HEALTH INSURANCE | Attending: Cardiology

## 2019-03-04 ENCOUNTER — Other Ambulatory Visit: Payer: Self-pay

## 2019-03-04 DIAGNOSIS — R079 Chest pain, unspecified: Secondary | ICD-10-CM

## 2019-03-04 DIAGNOSIS — R0600 Dyspnea, unspecified: Secondary | ICD-10-CM

## 2019-03-04 DIAGNOSIS — I259 Chronic ischemic heart disease, unspecified: Secondary | ICD-10-CM | POA: Diagnosis present

## 2019-03-04 MED ORDER — TECHNETIUM TC 99M TETROFOSMIN IV KIT
33.0000 | PACK | Freq: Once | INTRAVENOUS | Status: AC | PRN
  Administered 2019-03-04: 33 via INTRAVENOUS
  Filled 2019-03-04: qty 33

## 2019-03-04 MED ORDER — REGADENOSON 0.4 MG/5ML IV SOLN
0.4000 mg | Freq: Once | INTRAVENOUS | Status: AC
  Administered 2019-03-04: 0.4 mg via INTRAVENOUS

## 2019-03-05 ENCOUNTER — Ambulatory Visit (HOSPITAL_COMMUNITY): Payer: PRIVATE HEALTH INSURANCE | Attending: Cardiovascular Disease

## 2019-03-05 MED ORDER — TECHNETIUM TC 99M TETROFOSMIN IV KIT
32.1000 | PACK | Freq: Once | INTRAVENOUS | Status: AC | PRN
  Administered 2019-03-05: 32.1 via INTRAVENOUS
  Filled 2019-03-05: qty 33

## 2019-03-06 LAB — MYOCARDIAL PERFUSION IMAGING
LV dias vol: 81 mL (ref 46–106)
LV sys vol: 30 mL
Peak HR: 80 {beats}/min
Rest HR: 67 {beats}/min
SDS: 0
SRS: 0
SSS: 0
TID: 1

## 2019-03-26 ENCOUNTER — Other Ambulatory Visit: Payer: PRIVATE HEALTH INSURANCE

## 2019-03-29 ENCOUNTER — Telehealth: Payer: Self-pay | Admitting: Pulmonary Disease

## 2019-03-29 DIAGNOSIS — J432 Centrilobular emphysema: Secondary | ICD-10-CM

## 2019-03-29 MED ORDER — MONTELUKAST SODIUM 10 MG PO TABS: 10.0000 mg | ORAL_TABLET | Freq: Every day | ORAL | 0 refills | Status: DC

## 2019-03-29 MED ORDER — BREO ELLIPTA 200-25 MCG/INH IN AEPB: 1.0000 | INHALATION_SPRAY | Freq: Every day | RESPIRATORY_TRACT | 0 refills | Status: DC

## 2019-03-29 NOTE — Telephone Encounter (Signed)
Spoke with pt and advised rx sent to pharmacy. Nothing further is needed.   Appt made for f/u

## 2019-04-16 ENCOUNTER — Encounter: Payer: Self-pay | Admitting: Pulmonary Disease

## 2019-04-16 ENCOUNTER — Ambulatory Visit (INDEPENDENT_AMBULATORY_CARE_PROVIDER_SITE_OTHER): Payer: PRIVATE HEALTH INSURANCE | Admitting: Pulmonary Disease

## 2019-04-16 ENCOUNTER — Other Ambulatory Visit: Payer: Self-pay

## 2019-04-16 ENCOUNTER — Other Ambulatory Visit: Payer: Self-pay | Admitting: General Surgery

## 2019-04-16 DIAGNOSIS — J4530 Mild persistent asthma, uncomplicated: Secondary | ICD-10-CM | POA: Diagnosis not present

## 2019-04-16 DIAGNOSIS — J432 Centrilobular emphysema: Secondary | ICD-10-CM | POA: Diagnosis not present

## 2019-04-16 MED ORDER — LEVOCETIRIZINE DIHYDROCHLORIDE 5 MG PO TABS: 5.0000 mg | ORAL_TABLET | Freq: Every day | ORAL | 5 refills | Status: DC

## 2019-04-16 MED ORDER — BREO ELLIPTA 200-25 MCG/INH IN AEPB: 1.0000 | INHALATION_SPRAY | Freq: Every day | RESPIRATORY_TRACT | 5 refills | Status: DC

## 2019-04-16 NOTE — Progress Notes (Signed)
   Subjective:    Patient ID: Veronica Rivers, female    DOB: 1965-09-08, 53 y.o.   MRN: [id number]  HPI   I connected with  Veronica Rivers on 04/16/19 by phone and verified that I am speaking with the correct person using two identifiers.   I discussed the limitations of evaluation and management by telemedicine. The patient expressed understanding and agreed to proceed.    53 yo remote smoker for FU of emphysema. She smoked about 1.5 packs/day before she quit in 99 about 25 pack years.  She reports a diagnosis of asthma in her 28s triggered by exercise and weather changes and chest colds. She underwent detailed allergy testing which was negative. 4  Cats + 1 dog  in the house.  Meds - She was on Symbicort for a while and this was changed to [facility] in 2017. Incruse was stopped after her last visit 04/2018 showing good PFTs   She has flareups during spring and fall for which she requires albuterol usage.  But otherwise has done well on Breo.  She has run out of Xyzal and would like a refill. She developed esophageal spasm at present illness chest pain and underwent cardiac evaluation including negative stress test and CT angiogram.  She then underwent EGD and is being treated by GI .  I reviewed CT angiogram which shows emphysema apical  Significant tests/ events reviewed 02/2019 CT angiogram negative 10/2017 CT angiogram -no  evidence of pulmonary embolus,changes of bullous emphysema in apices   04/2018 PFTs ratio normal, FEV1 82%, TLC 89%, DLCO 85%   Review of Systems neg for any significant sore throat, dysphagia, itching, sneezing, nasal congestion or excess/ purulent secretions, fever, chills, sweats, unintended wt loss, pleuritic or exertional cp, hempoptysis, orthopnea pnd or change in chronic leg swelling. Also denies presyncope, palpitations, heartburn, abdominal pain, nausea, vomiting, diarrhea or change in bowel or urinary habits, dysuria,hematuria, rash, arthralgias, visual  complaints, headache, numbness weakness or ataxia.     Objective:   Physical Exam  Unable since televisit   Total encounter time including chart review and imaging x 21 m      Assessment & Plan:

## 2019-04-16 NOTE — Assessment & Plan Note (Signed)
On CT, stable

## 2019-04-16 NOTE — Assessment & Plan Note (Signed)
Appears allergic with seasonal exacerbations during spring and fall 5 Refills on Breo New prescription for Xyzal 5 mg daily for 1 month x 5 refills -take during spring and fall as discussed Continue on Singulair and Flonase

## 2019-04-16 NOTE — Patient Instructions (Signed)
5 Refills on Breo New prescription for Xyzal 5 mg daily for 1 month x 5 refills -take during spring and fall as discussed Continue on Singulair and Flonase

## 2019-05-16 ENCOUNTER — Other Ambulatory Visit: Payer: Self-pay | Admitting: Gastroenterology

## 2019-05-16 DIAGNOSIS — R112 Nausea with vomiting, unspecified: Secondary | ICD-10-CM

## 2019-05-22 ENCOUNTER — Other Ambulatory Visit: Payer: PRIVATE HEALTH INSURANCE

## 2019-06-17 ENCOUNTER — Other Ambulatory Visit: Payer: Self-pay

## 2019-06-17 ENCOUNTER — Other Ambulatory Visit: Payer: Self-pay | Admitting: Anesthesiology

## 2019-06-17 ENCOUNTER — Ambulatory Visit
Admission: RE | Admit: 2019-06-17 | Discharge: 2019-06-17 | Disposition: A | Discharge status: Home / Self Care | Payer: PRIVATE HEALTH INSURANCE | Source: Ambulatory Visit | Attending: Anesthesiology | Admitting: Anesthesiology

## 2019-06-17 DIAGNOSIS — M159 Polyosteoarthritis, unspecified: Secondary | ICD-10-CM

## 2019-08-10 IMAGING — CR CHEST - 2 VIEW
2 series · 2 of 2 positions shown · non-contrast
Comparison: 11/01/2017

CLINICAL DATA: Chest pain

EXAM:
CHEST - 2 VIEW

[x chest ap]
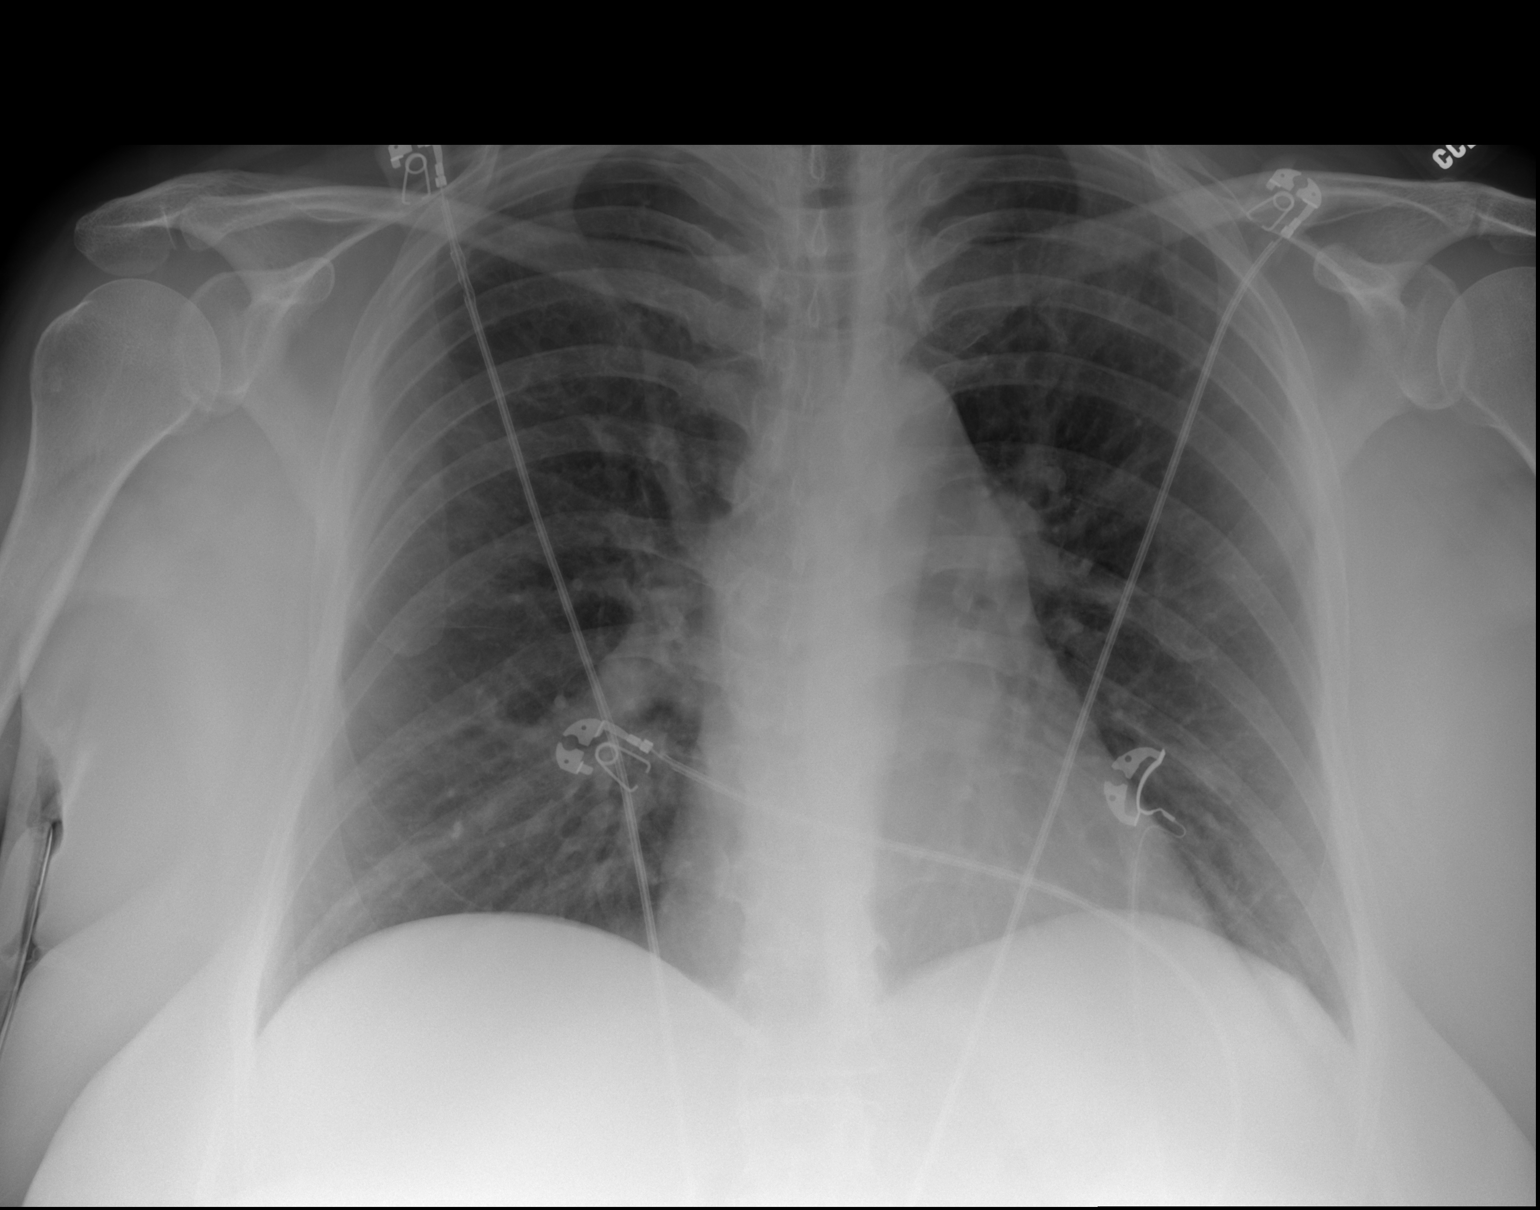

[w chest lat]
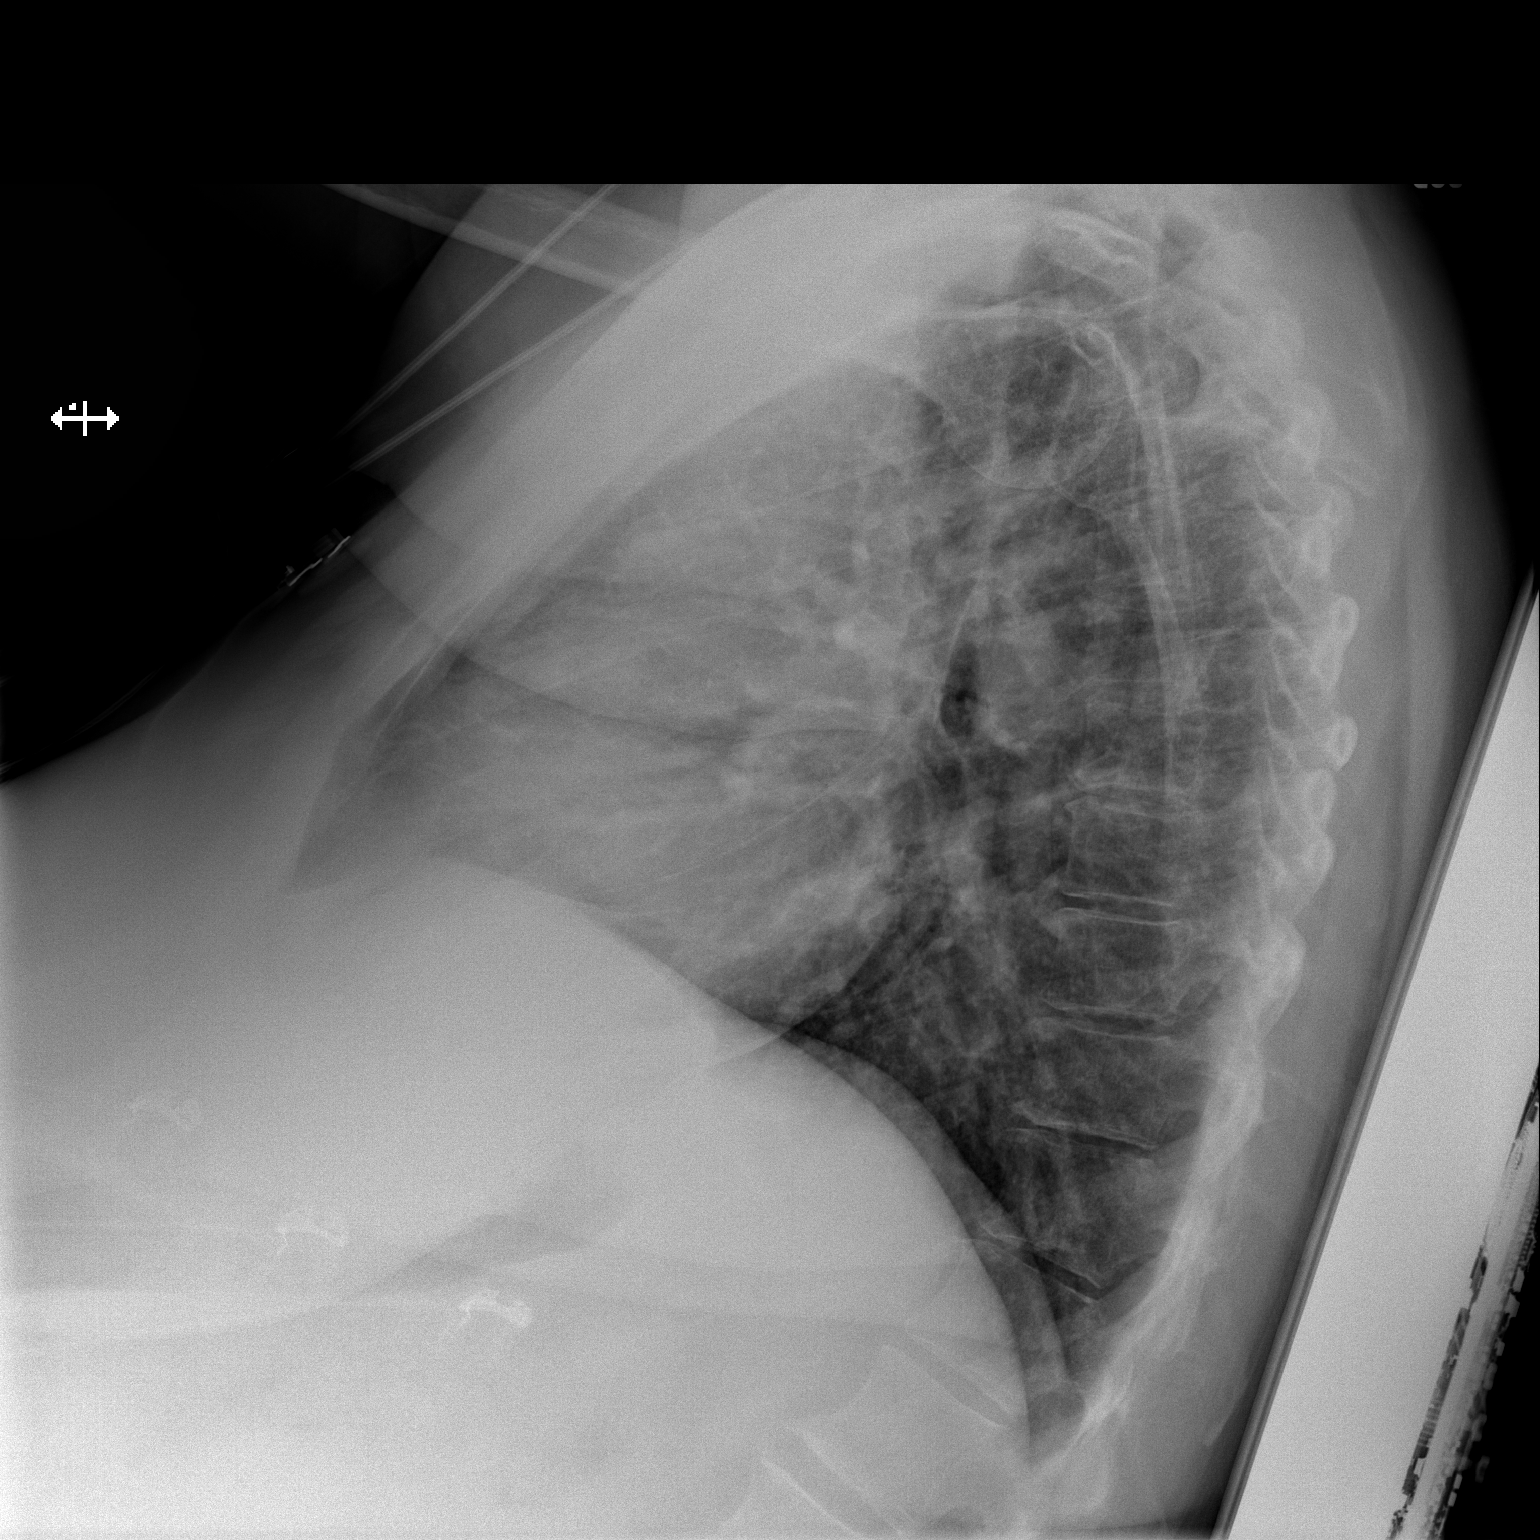

[2 of 2 positions shown; findings below may reference images not displayed]

FINDINGS: The heart size and mediastinal contours are stable. Both lungs are
clear without focal consolidation, pleural effusion, or
pneumothorax. The visualized skeletal structures are unremarkable.
IMPRESSION: No active cardiopulmonary disease.

## 2019-08-13 ENCOUNTER — Other Ambulatory Visit: Payer: Self-pay | Admitting: Pulmonary Disease

## 2019-09-12 ENCOUNTER — Other Ambulatory Visit: Payer: Self-pay | Admitting: Pulmonary Disease

## 2019-09-12 DIAGNOSIS — J432 Centrilobular emphysema: Secondary | ICD-10-CM

## 2019-09-13 NOTE — Progress Notes (Deleted)
CARDIOLOGY OFFICE NOTE  Date:  09/13/2019    Veronica Rivers Date of Birth: 05-Feb-1966 Medical Record [id number]  PCP:  Kristen Loader, FNP  Cardiologist:  Gillian Shields    No chief complaint on file.   History of Present Illness: Veronica Rivers is a 53 y.o. female who presents today for a 7 month check. Seen for Dr. Johnsie Cancel.   She has a history of HLD, SVT, obesity, COPD & emphysema along with type 2 DM. No known CAD. Echo in 2019 with normal EF. She has had atypical chest pain in the past that has led to hospitalization - she did not follow thru with recommendations for stress testing on a few occasions.   Seen back in October by me - needing clearance which had not been given since she had not follow thru on stress testing - this turned out to be low risk. Still endorsing chest pain and shortness of breath. Former smoker. Using too much salt.   The patient {does/does not:200015} have symptoms concerning for COVID-19 infection (fever, chills, cough, or new shortness of breath).   Comes in today. Here with   Past Medical History:  Diagnosis Date  . Asthma   . Bipolar depression ([place])   . Cellulitis   . CHEST PAIN   . DM   . DYSPNEA   . Emphysema of lung ([place])   . HYPERCHOLESTEROLEMIA   . HYPERLIPIDEMIA   . HYPOTHYROIDISM   . Neuropathy   . SUPRAVENTRICULAR TACHYCARDIA     Past Surgical History:  Procedure Laterality Date  . CESAREAN SECTION    . FOOT SURGERY    . NASAL SEPTUM SURGERY    . NECK SURGERY    . TONSILLECTOMY    . TUBAL LIGATION       Medications: No outpatient medications have been marked as taking for the 09/16/19 encounter (Appointment) with Burtis Junes, NP.     Allergies: Allergies  Allergen Reactions  . Omeprazole     Made her breathing worse.  . Meloxicam Other (See Comments)    Pt stated, "Got Burning, Acid Reflux with medicine" Pt should not be on any anti-inflammatories d/t elevated LFTs  . Zocor [Simvastatin] Other  (See Comments)    Caused muscle pain  . Sulfa Drugs Cross Reactors Rash    Social History: The patient  reports that she quit smoking about 21 years ago. Her smoking use included cigarettes. She quit after 15.00 years of use. She has never used smokeless tobacco. She reports that she does not drink alcohol or use drugs.   Family History: The patient's ***family history includes COPD in her mother; Cancer in her father; Heart attack in her mother; Heart disease in her mother; Hypertension in her mother.   Review of Systems: Please see the history of present illness.   All other systems are reviewed and negative.   Physical Exam: VS:  There were no vitals taken for this visit. [name]  BMI There is no height or weight on file to calculate BMI.  Wt Readings from Last 3 Encounters:  03/04/19 256 lb (116.1 kg)  02/25/19 256 lb (116.1 kg)  12/19/18 247 lb 11.2 oz (112.4 kg)    General: Pleasant. Well developed, well nourished and in no acute distress.   HEENT: Normal.  Neck: Supple, no JVD, carotid bruits, or masses noted.  Cardiac: ***Regular rate and rhythm. No murmurs, rubs, or gallops. No edema.  Respiratory:  Lungs are clear  to auscultation bilaterally with normal work of breathing.  GI: Soft and nontender.  MS: No deformity or atrophy. Gait and ROM intact.  Skin: Warm and dry. Color is normal.  Neuro:  Strength and sensation are intact and no gross focal deficits noted.  Psych: Alert, appropriate and with normal affect.   LABORATORY DATA:  EKG:  EKG {ACTION; IS/IS GI:087931 ordered today.  Personally reviewed by me. This demonstrates ***.  Lab Results  Component Value Date   WBC 5.9 12/06/2018   HGB 15.6 (H) 12/06/2018   HCT 49.5 (H) 12/06/2018   PLT 251 12/06/2018   GLUCOSE 172 (H) 02/26/2019   CHOL 224 (H) 12/19/2018   TRIG 112 12/19/2018   HDL 65 12/19/2018   LDLCALC 137 (H) 12/19/2018   ALT 17 11/01/2017   AST 16 11/01/2017   NA 139 02/26/2019   K 4.3 02/26/2019    CL 105 02/26/2019   CREATININE 0.77 02/26/2019   BUN 13 02/26/2019   CO2 30 (H) 02/26/2019   TSH 0.39 08/07/2013   HGBA1C 8.0 (H) 11/23/2012       BNP (last 3 results) No results for input(s): BNP in the last 8760 hours.  ProBNP (last 3 results) No results for input(s): PROBNP in the last 8760 hours.   Other Studies Reviewed Today:  Myoview Study Highlights 02/2019    There was no ST segment deviation noted during stress.  Nuclear stress EF: 63%.  The left ventricular ejection fraction is normal (55-65%).  The study is normal.  This is a low risk study.   Fixed apical inferolateral defect with normal wall motion in that region, suggestive of artifact      Echocardiogram 12/11/2017 Study Conclusions - Left ventricle: The cavity size was normal. Wall thickness was normal. Systolic function was normal. The estimated ejection fraction was in the range of 55% to 60%. Wall motion was normal; there were no regional wall motion abnormalities. Doppler parameters are consistent with abnormal left ventricular relaxation (grade 1 diastolic dysfunction).  Impressions: - Normal LV systolic function; mild diastolic dysfunction.  Assessment/Plan:  1. Pre op clearance - will need stress testing as previously indicated. 2 day Lexiscan ordered.   2. Chest pain - with multiple CV risk factors - along with abnormal EKG - stress testing has already been recommended - will get this arranged/rescheduled today.   3. Swelling - most likely from excessive salt - but with her complaint of shortness of breath - will check d dimer - if elevated she will need CT scan of the chest to rule out PE. Would hold on diuretic therapy for now.   4. GERD - needing EGD at some point.   5. DM - per PCP - now on insulin - she is not able to tell me her current A1C - having labs with Endocrine tomorrow.   6.  HLD - on statin  7. COPD/asthma/emphysema - prior smoker.  [name]  COVID-19 Education: The signs and symptoms of COVID-19 were discussed with the patient and how to seek care for testing (follow up with PCP or arrange E-visit).  The importance of social distancing, staying at home, hand hygiene and wearing a mask when out in public were discussed today.  Current medicines are reviewed with the patient today.  The patient does not have concerns regarding medicines other than what has been noted above.  The following changes have been made:  See above.  Labs/ tests ordered today include:   No orders of the  defined types were placed in this encounter.    Disposition:   FU with *** in {gen number [id number] {Days to years:10300}.   Patient is agreeable to this plan and will call if any problems develop in the interim.   SignedTruitt Merle, NP  09/13/2019 8:51 AM  [facility] [address] [place], [place]  [address] Phone: [phone] Fax: [phone]

## 2019-09-16 ENCOUNTER — Ambulatory Visit: Payer: PRIVATE HEALTH INSURANCE | Admitting: Nurse Practitioner

## 2019-09-24 ENCOUNTER — Ambulatory Visit
Admission: RE | Admit: 2019-09-24 | Discharge: 2019-09-24 | Disposition: A | Discharge status: Home / Self Care | Payer: PRIVATE HEALTH INSURANCE | Source: Ambulatory Visit | Attending: Gastroenterology | Admitting: Gastroenterology

## 2019-09-24 DIAGNOSIS — R112 Nausea with vomiting, unspecified: Secondary | ICD-10-CM

## 2019-09-25 ENCOUNTER — Other Ambulatory Visit: Payer: Self-pay | Admitting: Gastroenterology

## 2019-09-25 DIAGNOSIS — R935 Abnormal findings on diagnostic imaging of other abdominal regions, including retroperitoneum: Secondary | ICD-10-CM

## 2019-10-17 ENCOUNTER — Telehealth: Payer: Self-pay

## 2019-10-18 ENCOUNTER — Other Ambulatory Visit: Payer: Self-pay | Admitting: Pulmonary Disease

## 2019-10-18 DIAGNOSIS — J432 Centrilobular emphysema: Secondary | ICD-10-CM

## 2019-10-18 NOTE — Progress Notes (Signed)
CARDIOLOGY OFFICE NOTE  Date:  10/21/2019    Veronica Rivers Date of Birth: October 15, 1965 Medical Record #[id number]  PCP:  Kristen Loader, FNP  Cardiologist:  Johnsie Cancel    No chief complaint on file.   History of Present Illness: Veronica Rivers is a 54 y.o. female  with a history of HLD, SVT, obesity, COPD and type 2 DM. No known CAD. Echo in 2019 with normal EF. In the hospital July 2020 with atypical chest pain - felt to be GI related. Has had costochondritis in the past also. Was to have stress testing - this was never completed - she cancelled along with her follow up here.   Her dyspnea is with and without exertion. Her chest pain comes and goes - with and without exertion - some palpable pain but also not. Says it is no different from her hospitalization back in July. She is now on insulin. Her weight continues to climb. She is a former smoker.   Seen by NP 02/25/19 to clear for EGD/colonoscopy Myovue 03/05/19 normal EF 63% Cleared to have endoscopy with Dr Cyndia Skeeters [facility] Had infection in esophagus and Rx with improvement Still needs f/u colonoscopy   She sees Dr Elsworth Soho for COPD 25 pack year quit in 1999 CT with apical  Emphysema  Rx with Breo and Zyzal worse with seasonal allergies   No cardiac complaints   Past Medical History:  Diagnosis Date  . Asthma   . Bipolar depression ([place])   . Cellulitis   . CHEST PAIN   . DM   . DYSPNEA   . Emphysema of lung ([place])   . HYPERCHOLESTEROLEMIA   . HYPERLIPIDEMIA   . HYPOTHYROIDISM   . Neuropathy   . SUPRAVENTRICULAR TACHYCARDIA     Past Surgical History:  Procedure Laterality Date  . CESAREAN SECTION    . FOOT SURGERY    . NASAL SEPTUM SURGERY    . NECK SURGERY    . TONSILLECTOMY    . TUBAL LIGATION       Medications: No outpatient medications have been marked as taking for the 10/21/19 encounter (Appointment) with Josue Hector, MD.     Allergies: Allergies  Allergen Reactions  . Omeprazole      Made her breathing worse.  . Meloxicam Other (See Comments)    Pt stated, "Got Burning, Acid Reflux with medicine" Pt should not be on any anti-inflammatories d/t elevated LFTs  . Zocor [Simvastatin] Other (See Comments)    Caused muscle pain  . Sulfa Drugs Cross Reactors Rash    Social History: The patient  reports that she quit smoking about 21 years ago. Her smoking use included cigarettes. She quit after 15.00 years of use. She has never used smokeless tobacco. She reports that she does not drink alcohol and does not use drugs.   Family History: The patient's family history includes COPD in her mother; Cancer in her father; Heart attack in her mother; Heart disease in her mother; Hypertension in her mother.   Review of Systems: Please see the history of present illness.   All other systems are reviewed and negative.   Physical Exam: VS:  There were no vitals taken for this visit. [name]  BMI There is no height or weight on file to calculate BMI.  Wt Readings from Last 3 Encounters:  03/04/19 256 lb (116.1 kg)  02/25/19 256 lb (116.1 kg)  12/19/18 247 lb 11.2 oz (112.4 kg)  Overweight female No distress No JVP elevation  Trace edema No tachypnea No audible wheezing    LABORATORY DATA:  EKG:   SR ICRBBB nonspecific ST changes 02/25/19   Lab Results  Component Value Date   WBC 5.9 12/06/2018   HGB 15.6 (H) 12/06/2018   HCT 49.5 (H) 12/06/2018   PLT 251 12/06/2018   GLUCOSE 172 (H) 02/26/2019   CHOL 224 (H) 12/19/2018   TRIG 112 12/19/2018   HDL 65 12/19/2018   LDLCALC 137 (H) 12/19/2018   ALT 17 11/01/2017   AST 16 11/01/2017   NA 139 02/26/2019   K 4.3 02/26/2019   CL 105 02/26/2019   CREATININE 0.77 02/26/2019   BUN 13 02/26/2019   CO2 30 (H) 02/26/2019   TSH 0.39 08/07/2013   HGBA1C 8.0 (H) 11/23/2012    Other Studies Reviewed Today:  Echocardiogram 12/11/2017 Study Conclusions - Left ventricle: The cavity size was normal. Wall thickness was normal.  Systolic function was normal. The estimated ejection fraction was in the range of 55% to 60%. Wall motion was normal; there were no regional wall motion abnormalities. Doppler parameters are consistent with abnormal left ventricular relaxation (grade 1 diastolic dysfunction).  Impressions: - Normal LV systolic function; mild diastolic dysfunction.  Assessment/Plan:  1. Chest pain - multiple risk factors atypical normal myovue 03/05/19 observe  3. Swelling - dependant edema from weight October 2020 CT negative for PE PRN lasix    4. GERD - f/u with [facility] dilated CBD for abdominal MRI 10/29/19   5. DM - per PCP - now on insulin   6.  HLD - on statin  7. COPD/asthma/emphysema - prior smoker. F/u Dr Elsworth Soho   8. COVID-19 Education: The signs and symptoms of COVID-19 were discussed with the patient and how to seek care for testing (follow up with PCP or arrange E-visit).  The importance of social distancing, staying at home, hand hygiene and wearing a mask when out in public were discussed today.  Current medicines are reviewed with the patient today.  The patient does not have concerns regarding medicines other than what has been noted above.  The following changes have been made:  PRN Lasix      No orders of the defined types were placed in this encounter.    Disposition:   F/U in a year   Time:  Spent reviewing chart myovue, pulmonary notes direct patient interview and composing note 20 minutes   Signed: [name], MD  10/21/2019 9:03 AM  [facility] [address] [place], [place]  [address] Phone: [phone] Fax: [phone]

## 2019-10-21 ENCOUNTER — Telehealth (INDEPENDENT_AMBULATORY_CARE_PROVIDER_SITE_OTHER): Payer: PRIVATE HEALTH INSURANCE | Admitting: Cardiovascular Disease

## 2019-10-21 VITALS — Ht 70.0 in | Wt 251.0 lb

## 2019-10-21 DIAGNOSIS — R079 Chest pain, unspecified: Secondary | ICD-10-CM | POA: Diagnosis not present

## 2019-10-21 NOTE — Patient Instructions (Signed)
Medication Instructions:  *If you need a refill on your cardiac medications before your next appointment, please call your pharmacy*  Lab Work: If you have labs (blood work) drawn today and your tests are completely normal, you will receive your results only by: [name] MyChart Message (if you have MyChart) OR . A paper copy in the mail If you have any lab test that is abnormal or we need to change your treatment, we will call you to review the results.  Testing/Procedures: None ordered today.  Follow-Up: At [facility], you and your health needs are our priority.  As part of our continuing mission to provide you with exceptional heart care, we have created designated Provider Care Teams.  These Care Teams include your primary Cardiologist (physician) and Advanced Practice Providers (APPs -  Physician Assistants and Nurse Practitioners) who all work together to provide you with the care you need, when you need it.  We recommend signing up for the patient portal called "MyChart".  Sign up information is provided on this After Visit Summary.  MyChart is used to connect with patients for Virtual Visits (Telemedicine).  Patients are able to view lab/test results, encounter notes, upcoming appointments, etc.  Non-urgent messages can be sent to your provider as well.   To learn more about what you can do with MyChart, go to [email or web address].    Your next appointment:   6 month(s)  Provider:   You may see [name], MD or one of the following Advanced Practice Providers on your designated Care Team:    Truitt Merle, NP  Cecilie Kicks, NP  Kathyrn Drown, NP

## 2019-10-21 NOTE — Telephone Encounter (Signed)
Sent Mychart message of consent for virtual visit.

## 2019-10-29 ENCOUNTER — Other Ambulatory Visit: Payer: Self-pay

## 2019-10-29 ENCOUNTER — Ambulatory Visit
Admission: RE | Admit: 2019-10-29 | Discharge: 2019-10-29 | Disposition: A | Discharge status: Home / Self Care | Payer: PRIVATE HEALTH INSURANCE | Source: Ambulatory Visit | Attending: Gastroenterology | Admitting: Gastroenterology

## 2019-10-29 DIAGNOSIS — R935 Abnormal findings on diagnostic imaging of other abdominal regions, including retroperitoneum: Secondary | ICD-10-CM

## 2019-10-29 MED ORDER — GADOBENATE DIMEGLUMINE 529 MG/ML IV SOLN
20.0000 mL | Freq: Once | INTRAVENOUS | Status: AC | PRN
  Administered 2019-10-29: 20 mL via INTRAVENOUS

## 2019-10-31 IMAGING — CT CT ANGIO CHEST
2 of 8 series · 19 of 46 positions shown · IV contrast (OMNIPAQUE 350)
Comparison: 11/01/2017

CLINICAL DATA: Shortness of breath.  Elevated D-dimer.

EXAM:
CT ANGIOGRAPHY CHEST WITH CONTRAST
TECHNIQUE: Multidetector CT imaging of the chest was performed using the
standard protocol during bolus administration of intravenous
contrast. Multiplanar CT image reconstructions and MIPs were
obtained to evaluate the vascular anatomy.
CONTRAST:  80mL OMNIPAQUE IOHEXOL 350 MG/ML SOLN

[Series 5: thins · axial · 0.66mm/px · z∈[-319,-72]mm · 16 of 277 slices shown]
[im 15/277  lung]
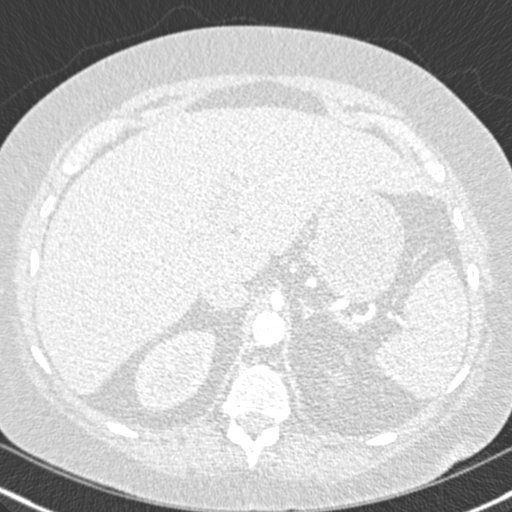
[im 30/277  soft-tissue]
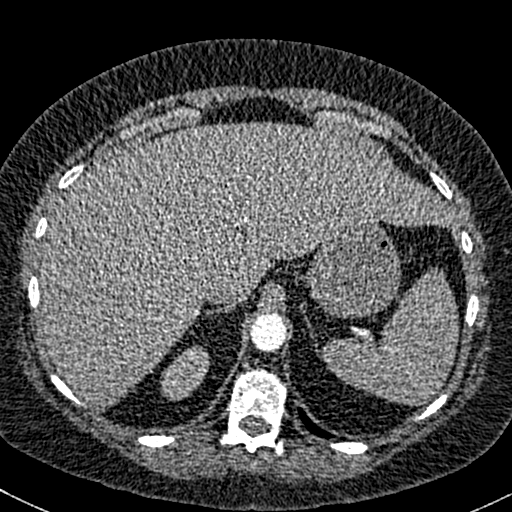
[im 44/277  lung]
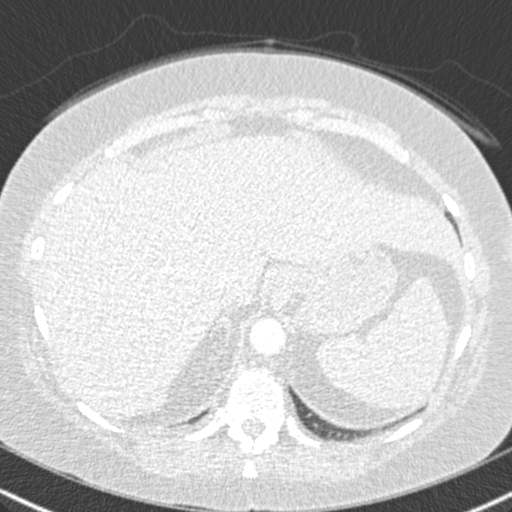
[im 59/277  soft-tissue]
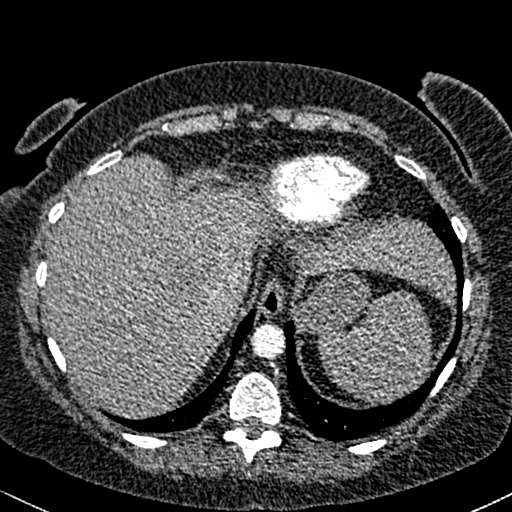
[im 88/277  lung]
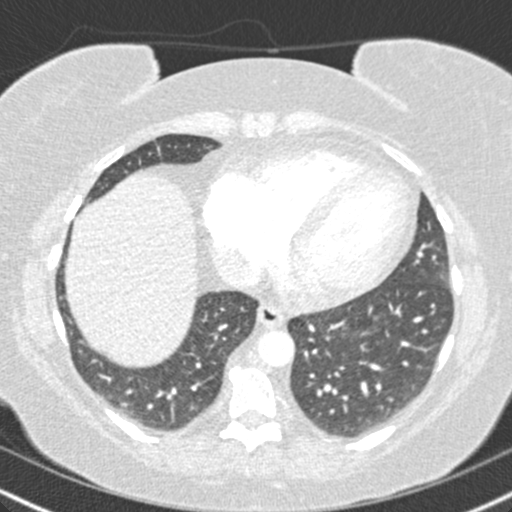
[im 102/277  soft-tissue]
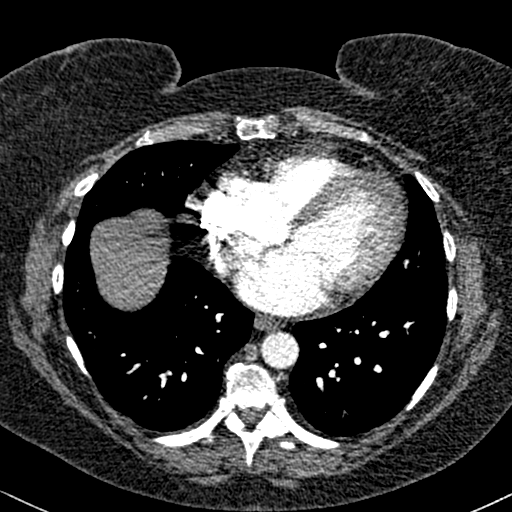
[im 117/277  lung]
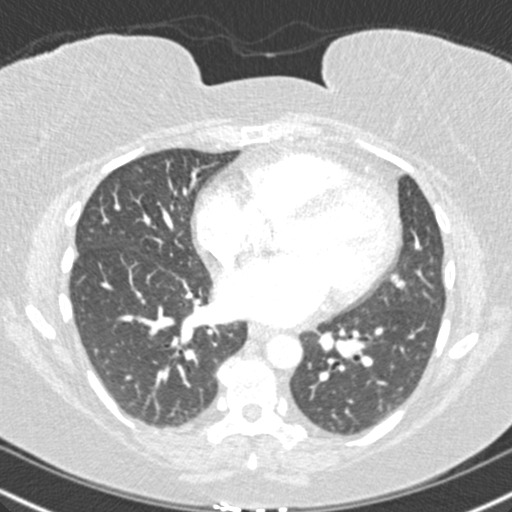
[im 131/277  soft-tissue]
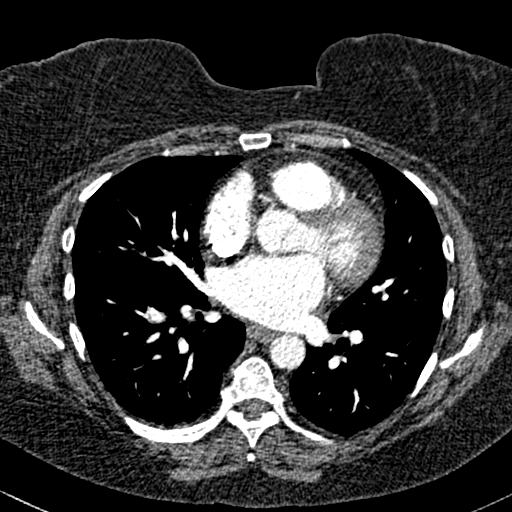
[im 146/277  lung]
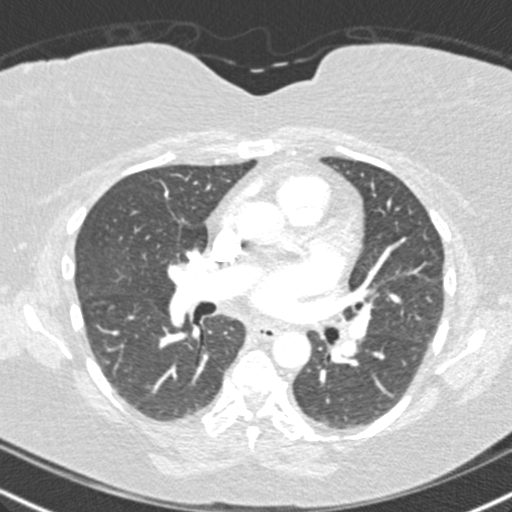
[im 160/277  soft-tissue]
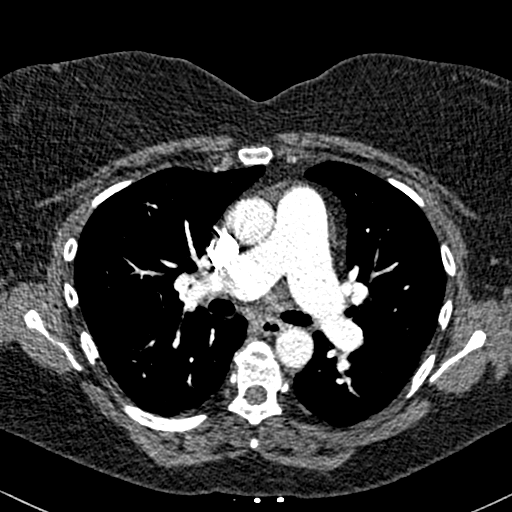
[im 175/277  lung]
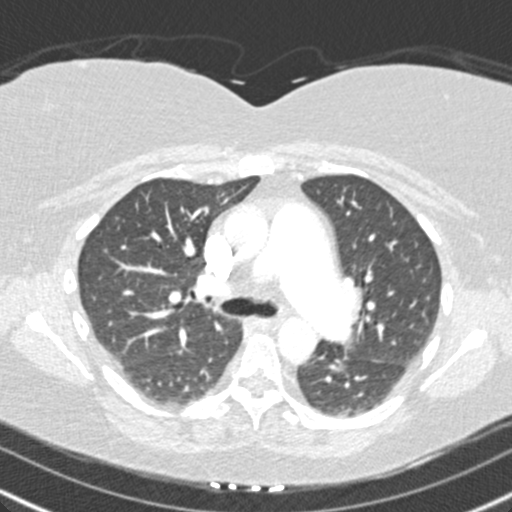
[im 189/277  soft-tissue]
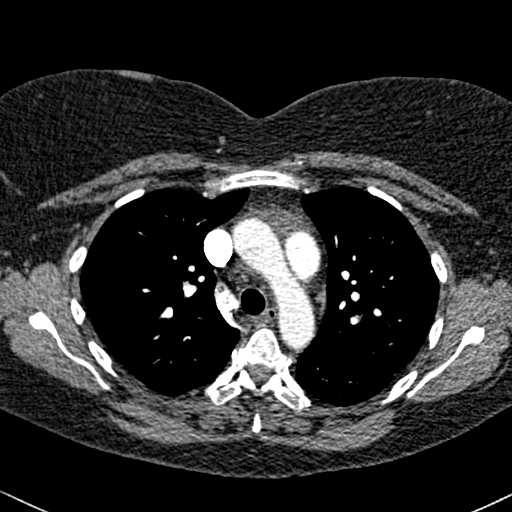
[im 218/277  lung]
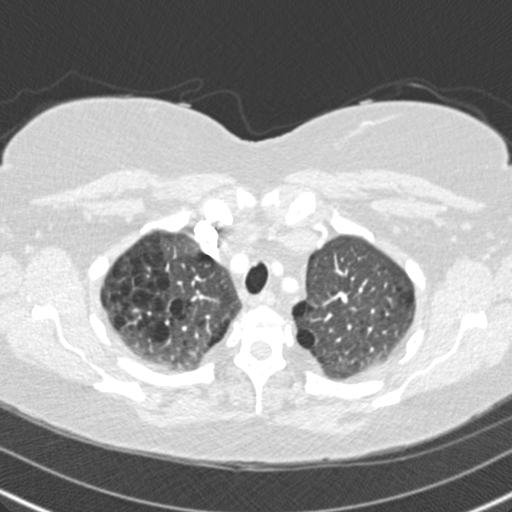
[im 233/277  soft-tissue]
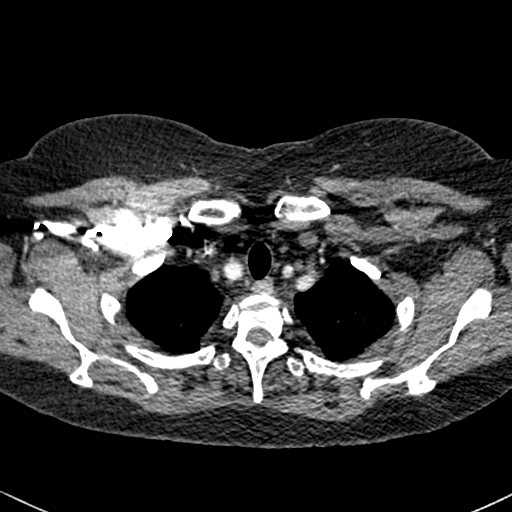
[im 247/277  lung]
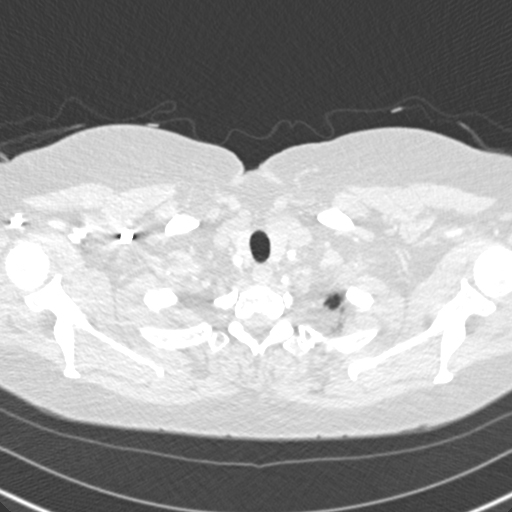
[im 262/277  soft-tissue]
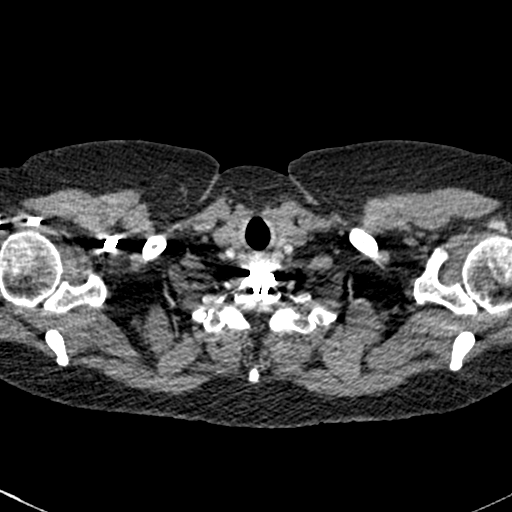

[Series 7: coronal mpr · coronal · 0.59mm/px · 3 of 127 slices shown]
[im 32/127  soft-tissue]
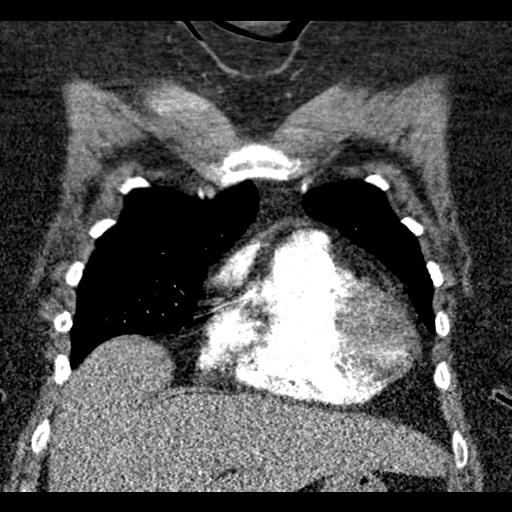
[im 64/127  soft-tissue]
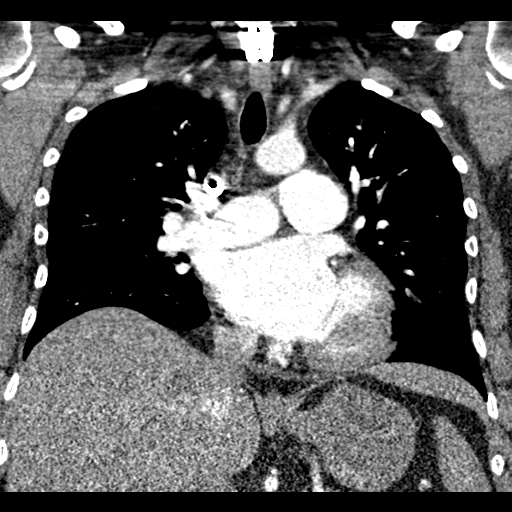
[im 95/127  soft-tissue]
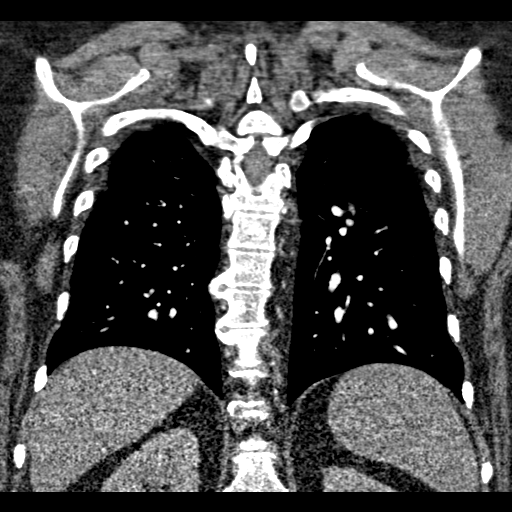

[19 of 46 positions shown; findings below may reference images not displayed]

FINDINGS: Cardiovascular: Heart is mildly enlarged. Aorta is normal caliber.
No filling defects in the pulmonary arteries to suggest pulmonary
emboli.

Mediastinum/Nodes: No mediastinal, hilar, or axillary adenopathy.
Trachea and esophagus are unremarkable. Thyroid unremarkable.

Lungs/Pleura: Paraseptal and centrilobular emphysema within the
apices. Bullous changes in the apices. This is unchanged since prior
study. No confluent airspace opacity or effusion.

Upper Abdomen: Imaging into the upper abdomen shows no acute
findings.

Musculoskeletal: Chest wall soft tissues are unremarkable. No acute
bony abnormality.

Review of the MIP images confirms the above findings.
IMPRESSION: No evidence of pulmonary embolus.

No acute cardiopulmonary disease.

Emphysema ([id number]).

## 2020-01-09 ENCOUNTER — Other Ambulatory Visit: Payer: Self-pay | Admitting: Pulmonary Disease

## 2020-01-15 ENCOUNTER — Ambulatory Visit: Payer: PRIVATE HEALTH INSURANCE | Admitting: Orthopaedic Surgery

## 2020-02-06 ENCOUNTER — Other Ambulatory Visit: Payer: Self-pay | Admitting: *Deleted

## 2020-02-06 MED ORDER — CARVEDILOL 12.5 MG PO TABS: ORAL_TABLET | ORAL | 2 refills | Status: DC

## 2020-02-12 ENCOUNTER — Other Ambulatory Visit: Payer: Self-pay | Admitting: Gastroenterology

## 2020-02-12 DIAGNOSIS — R9389 Abnormal findings on diagnostic imaging of other specified body structures: Secondary | ICD-10-CM

## 2020-02-22 ENCOUNTER — Other Ambulatory Visit: Payer: Self-pay | Admitting: Pulmonary Disease

## 2020-02-27 ENCOUNTER — Other Ambulatory Visit: Payer: Self-pay | Admitting: Gastroenterology

## 2020-02-27 ENCOUNTER — Other Ambulatory Visit (HOSPITAL_COMMUNITY): Payer: Self-pay | Admitting: Gastroenterology

## 2020-02-27 DIAGNOSIS — R112 Nausea with vomiting, unspecified: Secondary | ICD-10-CM

## 2020-03-07 ENCOUNTER — Other Ambulatory Visit: Payer: Self-pay

## 2020-03-07 ENCOUNTER — Ambulatory Visit
Admission: RE | Admit: 2020-03-07 | Discharge: 2020-03-07 | Disposition: A | Discharge status: Home / Self Care | Payer: PRIVATE HEALTH INSURANCE | Source: Ambulatory Visit | Attending: Gastroenterology | Admitting: Gastroenterology

## 2020-03-07 DIAGNOSIS — R9389 Abnormal findings on diagnostic imaging of other specified body structures: Secondary | ICD-10-CM

## 2020-03-07 MED ORDER — GADOBENATE DIMEGLUMINE 529 MG/ML IV SOLN
20.0000 mL | Freq: Once | INTRAVENOUS | Status: AC | PRN
  Administered 2020-03-07: 20 mL via INTRAVENOUS

## 2020-03-16 ENCOUNTER — Ambulatory Visit (HOSPITAL_COMMUNITY): Payer: PRIVATE HEALTH INSURANCE

## 2020-03-16 ENCOUNTER — Encounter (HOSPITAL_COMMUNITY): Payer: Self-pay

## 2020-04-16 NOTE — Progress Notes (Deleted)
CARDIOLOGY OFFICE NOTE  Date:  04/16/2020    Veronica Rivers Date of Birth: Mar 26, 1966 Medical Record #[id number]  PCP:  Kristen Loader, FNP  Cardiologist:  Johnsie Cancel    No chief complaint on file.   History of Present Illness: Veronica Rivers is a 54 y.o. female  with a history of HLD, SVT, obesity, COPD and type 2 DM. No known CAD. Echo in 2019 with normal EF. In the hospital July 2020 with atypical chest pain - felt to be GI related. Has had costochondritis in the past Did not f/u for stress testing   She is diabetic on insulin weight is up   Myovue 03/05/19 normal EF 63% Cleared to have endoscopy with Dr Cyndia Skeeters GI Had infection in esophagus and Rx with improvement   She sees Dr Elsworth Soho for COPD 25 pack year quit in 1999 CT with apical  Emphysema  Rx with Breo and Zyzal worse with seasonal allergies   No cardiac complaints   Past Medical History:  Diagnosis Date  . Asthma   . Bipolar depression ([place])   . Cellulitis   . CHEST PAIN   . DM   . DYSPNEA   . Emphysema of lung ([place])   . HYPERCHOLESTEROLEMIA   . HYPERLIPIDEMIA   . HYPOTHYROIDISM   . Neuropathy   . SUPRAVENTRICULAR TACHYCARDIA     Past Surgical History:  Procedure Laterality Date  . CESAREAN SECTION    . FOOT SURGERY    . NASAL SEPTUM SURGERY    . NECK SURGERY    . TONSILLECTOMY    . TUBAL LIGATION       Medications: No outpatient medications have been marked as taking for the 04/20/20 encounter (Appointment) with Josue Hector, MD.     Allergies: Allergies  Allergen Reactions  . Omeprazole     Made her breathing worse.  . Meloxicam Other (See Comments)    Pt stated, "Got Burning, Acid Reflux with medicine" Pt should not be on any anti-inflammatories d/t elevated LFTs  . Zocor [Simvastatin] Other (See Comments)    Caused muscle pain  . Sulfa Drugs Cross Reactors Rash    Social History: The patient  reports that she quit smoking about 22 years ago. Her smoking use  included cigarettes. She quit after 15.00 years of use. She has never used smokeless tobacco. She reports that she does not drink alcohol and does not use drugs.   Family History: The patient's family history includes COPD in her mother; Cancer in her father; Heart attack in her mother; Heart disease in her mother; Hypertension in her mother.   Review of Systems: Please see the history of present illness.   All other systems are reviewed and negative.   Physical Exam: VS:  There were no vitals taken for this visit. [name]  BMI There is no height or weight on file to calculate BMI.  Wt Readings from Last 3 Encounters:  10/21/19 113.9 kg  03/04/19 116.1 kg  02/25/19 116.1 kg    Affect appropriate Healthy:  appears stated age 81: normal Neck supple with no adenopathy JVP normal no bruits no thyromegaly Lungs clear with no wheezing and good diaphragmatic motion Heart:  S1/S2 no murmur, no rub, gallop or click PMI normal Abdomen: benighn, BS positve, no tenderness, no AAA no bruit.  No HSM or HJR Distal pulses intact with no bruits No edema Neuro non-focal Skin warm and dry No muscular weakness   LABORATORY  DATA:  EKG:   SR ICRBBB nonspecific ST changes 02/25/19   Lab Results  Component Value Date   WBC 5.9 12/06/2018   HGB 15.6 (H) 12/06/2018   HCT 49.5 (H) 12/06/2018   PLT 251 12/06/2018   GLUCOSE 172 (H) 02/26/2019   CHOL 224 (H) 12/19/2018   TRIG 112 12/19/2018   HDL 65 12/19/2018   LDLCALC 137 (H) 12/19/2018   ALT 17 11/01/2017   AST 16 11/01/2017   NA 139 02/26/2019   K 4.3 02/26/2019   CL 105 02/26/2019   CREATININE 0.77 02/26/2019   BUN 13 02/26/2019   CO2 30 (H) 02/26/2019   TSH 0.39 08/07/2013   HGBA1C 8.0 (H) 11/23/2012    Other Studies Reviewed Today:  Echocardiogram 12/11/2017 Study Conclusions - Left ventricle: The cavity size was normal. Wall thickness was normal. Systolic function was normal. The estimated ejection fraction was in the  range of 55% to 60%. Wall motion was normal; there were no regional wall motion abnormalities. Doppler parameters are consistent with abnormal left ventricular relaxation (grade 1 diastolic dysfunction).  Impressions: - Normal LV systolic function; mild diastolic dysfunction.  Assessment/Plan:  1. Chest pain - multiple risk factors atypical normal myovue 03/05/19 observe  3. Swelling - dependant PRN lasix   4. GERD - f/u with [facility] dilated CBD on [place] but MRI 03/07/20 normal Gastric emptying Study ordered ? Consider Reglan given DM   5. DM - per PCP - now on insulin   6.  HLD - on statin labs with primary   7. COPD/asthma/emphysema - prior smoker. F/u Dr Elsworth Soho CT 02/26/19 no PE emphysema no cancer   ***   Disposition:   F/U in a year   Time:  Spent reviewing chart myovue, pulmonary notes direct patient interview and composing note 20 minutes   Signed: [name], MD  04/16/2020 5:47 PM  [facility] [address] [place], [place]  [address] Phone: [phone] Fax: [phone]

## 2020-04-20 ENCOUNTER — Ambulatory Visit: Payer: PRIVATE HEALTH INSURANCE | Admitting: Cardiovascular Disease

## 2020-04-28 ENCOUNTER — Other Ambulatory Visit: Payer: Self-pay | Admitting: Pulmonary Disease

## 2020-04-28 DIAGNOSIS — J432 Centrilobular emphysema: Secondary | ICD-10-CM

## 2020-05-29 NOTE — Progress Notes (Deleted)
CARDIOLOGY OFFICE NOTE  Date:  05/29/2020    Veronica Rivers Date of Birth: Mar 27, 1966 Medical Record #[id number]  PCP:  Kristen Loader, FNP  Cardiologist:  Johnsie Cancel    HPI: Veronica Rivers is a 55 y.o. female  with a history of HLD, SVT, obesity, COPD and type 2 DM. No known CAD. Echo in 2019 with normal EF. In the hospital July 2020 with atypical chest pain - felt to be GI related. Has had costochondritis in the past also. Was to have stress testing - this was never completed - she cancelled along with her follow up here.   Her dyspnea is with and without exertion. Her chest pain comes and goes - with and without exertion - some palpable pain but also not. Says it is no different from her hospitalization back in July. She is now on insulin. Her weight continues to climb. She is a former smoker.   Seen by NP 02/25/19 to clear for EGD/colonoscopy Myovue 03/05/19 normal EF 63% Cleared to have endoscopy with Dr Cyndia Skeeters GI Had infection in esophagus and Rx with improvement Still needs f/u colonoscopy   She sees Dr Elsworth Soho for COPD 25 pack year quit in 1999 CT with apical  Emphysema  Rx with Breo and Zyzal worse with seasonal allergies   No cardiac complaints   Past Medical History:  Diagnosis Date  . Asthma   . Bipolar depression ([place])   . Cellulitis   . CHEST PAIN   . DM   . DYSPNEA   . Emphysema of lung ([place])   . HYPERCHOLESTEROLEMIA   . HYPERLIPIDEMIA   . HYPOTHYROIDISM   . Neuropathy   . SUPRAVENTRICULAR TACHYCARDIA     Past Surgical History:  Procedure Laterality Date  . CESAREAN SECTION    . FOOT SURGERY    . NASAL SEPTUM SURGERY    . NECK SURGERY    . TONSILLECTOMY    . TUBAL LIGATION       Medications: No outpatient medications have been marked as taking for the 06/05/20 encounter (Appointment) with Josue Hector, MD.     Allergies: Allergies  Allergen Reactions  . Omeprazole     Made her breathing worse.  . Meloxicam Other (See  Comments)    Pt stated, "Got Burning, Acid Reflux with medicine" Pt should not be on any anti-inflammatories d/t elevated LFTs  . Zocor [Simvastatin] Other (See Comments)    Caused muscle pain  . Sulfa Drugs Cross Reactors Rash    Social History: The patient  reports that she quit smoking about 22 years ago. Her smoking use included cigarettes. She quit after 15.00 years of use. She has never used smokeless tobacco. She reports that she does not drink alcohol and does not use drugs.   Family History: The patient's family history includes COPD in her mother; Cancer in her father; Heart attack in her mother; Heart disease in her mother; Hypertension in her mother.   Review of Systems: Please see the history of present illness.   All other systems are reviewed and negative.   Physical Exam: VS:  There were no vitals taken for this visit. [name]  BMI There is no height or weight on file to calculate BMI.  Wt Readings from Last 3 Encounters:  10/21/19 113.9 kg  03/04/19 116.1 kg  02/25/19 116.1 kg    Affect appropriate Healthy:  appears stated age 67: normal Neck supple with no adenopathy JVP normal no  bruits no thyromegaly Lungs clear with no wheezing and good diaphragmatic motion Heart:  S1/S2 no murmur, no rub, gallop or click PMI normal Abdomen: benighn, BS positve, no tenderness, no AAA no bruit.  No HSM or HJR Distal pulses intact with no bruits No edema Neuro non-focal Skin warm and dry No muscular weakness   LABORATORY DATA:  EKG:   SR ICRBBB nonspecific ST changes 02/25/19   Lab Results  Component Value Date   WBC 5.9 12/06/2018   HGB 15.6 (H) 12/06/2018   HCT 49.5 (H) 12/06/2018   PLT 251 12/06/2018   GLUCOSE 172 (H) 02/26/2019   CHOL 224 (H) 12/19/2018   TRIG 112 12/19/2018   HDL 65 12/19/2018   LDLCALC 137 (H) 12/19/2018   ALT 17 11/01/2017   AST 16 11/01/2017   NA 139 02/26/2019   K 4.3 02/26/2019   CL 105 02/26/2019   CREATININE 0.77 02/26/2019    BUN 13 02/26/2019   CO2 30 (H) 02/26/2019   TSH 0.39 08/07/2013   HGBA1C 8.0 (H) 11/23/2012    Other Studies Reviewed Today:  Echocardiogram 12/11/2017 Study Conclusions - Left ventricle: The cavity size was normal. Wall thickness was normal. Systolic function was normal. The estimated ejection fraction was in the range of 55% to 60%. Wall motion was normal; there were no regional wall motion abnormalities. Doppler parameters are consistent with abnormal left ventricular relaxation (grade 1 diastolic dysfunction).  Impressions: - Normal LV systolic function; mild diastolic dysfunction.  Assessment/Plan:  1. Chest pain - multiple risk factors atypical normal myovue 03/05/19 observe  3. Swelling - dependant edema from weight October 2020 CT negative for PE PRN lasix    4. GERD - f/u with [facility] dilated CBD for abdominal MRI 10/29/19   5. DM - per PCP - now on insulin   6.  HLD - on statin  7. COPD/asthma/emphysema - prior smoker. F/u Dr Elsworth Soho   8. COVID-19 Education: The signs and symptoms of COVID-19 were discussed with the patient and how to seek care for testing (follow up with PCP or arrange E-visit).  The importance of social distancing, staying at home, hand hygiene and wearing a mask when out in public were discussed today.  Current medicines are reviewed with the patient today.  The patient does not have concerns regarding medicines other than what has been noted above.  The following changes have been made:  PRN Lasix    No orders of the defined types were placed in this encounter.   Disposition:   F/U in a year   Time:  Spent reviewing chart myovue, pulmonary notes direct patient interview and composing note 20 minutes   Signed: [name], MD  05/29/2020 11:37 AM  [facility] [address] [place], [place]  [address] Phone: [phone] Fax: [phone]

## 2020-06-04 NOTE — Progress Notes (Unsigned)
Virtual Visit via Video Note   This visit type was conducted due to national recommendations for restrictions regarding the COVID-19 Pandemic (e.g. social distancing) in an effort to limit this patient's exposure and mitigate transmission in our community.  Due to her co-morbid illnesses, this patient is at least at moderate risk for complications without adequate follow up.  This format is felt to be most appropriate for this patient at this time.  All issues noted in this document were discussed and addressed.  A limited physical exam was performed with this format.  Please refer to the patient's chart for her consent to telehealth for [facility].   Patient Location : Home Physician Location: Office   Date:  06/04/2020    Veronica Rivers Date of Birth: 1965-08-11 Medical Record #[id number]  PCP:  Kristen Loader, FNP  Cardiologist:  Johnsie Cancel    HPI: Veronica Rivers is a 55 y.o. female  with a history of HLD, SVT, obesity, COPD and type 2 DM. No known CAD. Echo in 2019 with normal EF. In the hospital July 2020 with atypical chest pain - felt to be GI related. Has had costochondritis in the past also. Was to have stress testing - this was never completed - she cancelled along with her follow up here.   Her dyspnea is with and without exertion. Her chest pain comes and goes - with and without exertion - some palpable pain but also not. Says it is no different from her hospitalization back in July. She is now on insulin. Her weight continues to climb. She is a former smoker.   Seen by NP 02/25/19 to clear for EGD/colonoscopy Myovue 03/05/19 normal EF 63% Cleared to have endoscopy with Dr Cyndia Skeeters GI Had infection in esophagus and Rx with improvement Still needs f/u colonoscopy   She sees Dr Elsworth Soho for COPD 25 pack year quit in 1999 CT with apical  Emphysema  Rx with Breo and Zyzal worse with seasonal allergies   No cardiac complaints   Past Medical History:  Diagnosis Date   . Asthma   . Bipolar depression ([place])   . Cellulitis   . CHEST PAIN   . DM   . DYSPNEA   . Emphysema of lung ([place])   . HYPERCHOLESTEROLEMIA   . HYPERLIPIDEMIA   . HYPOTHYROIDISM   . Neuropathy   . SUPRAVENTRICULAR TACHYCARDIA     Past Surgical History:  Procedure Laterality Date  . CESAREAN SECTION    . FOOT SURGERY    . NASAL SEPTUM SURGERY    . NECK SURGERY    . TONSILLECTOMY    . TUBAL LIGATION       Medications: No outpatient medications have been marked as taking for the 06/08/20 encounter (Appointment) with Josue Hector, MD.     Allergies: Allergies  Allergen Reactions  . Omeprazole     Made her breathing worse.  . Meloxicam Other (See Comments)    Pt stated, "Got Burning, Acid Reflux with medicine" Pt should not be on any anti-inflammatories d/t elevated LFTs  . Zocor [Simvastatin] Other (See Comments)    Caused muscle pain  . Sulfa Drugs Cross Reactors Rash    Social History: The patient  reports that she quit smoking about 22 years ago. Her smoking use included cigarettes. She quit after 15.00 years of use. She has never used smokeless tobacco. She reports that she does not drink alcohol and does not use drugs.  Family History: The patient's family history includes COPD in her mother; Cancer in her father; Heart attack in her mother; Heart disease in her mother; Hypertension in her mother.   Review of Systems: Please see the history of present illness.   All other systems are reviewed and negative.   Physical Exam: VS:  There were no vitals taken for this visit. [name]  BMI There is no height or weight on file to calculate BMI.  Wt Readings from Last 3 Encounters:  10/21/19 113.9 kg  03/04/19 116.1 kg  02/25/19 116.1 kg    Telephone no exam    LABORATORY DATA:  EKG:   SR ICRBBB nonspecific ST changes 02/25/19   Lab Results  Component Value Date   WBC 5.9 12/06/2018   HGB 15.6 (H) 12/06/2018   HCT 49.5 (H) 12/06/2018   PLT 251  12/06/2018   GLUCOSE 172 (H) 02/26/2019   CHOL 224 (H) 12/19/2018   TRIG 112 12/19/2018   HDL 65 12/19/2018   LDLCALC 137 (H) 12/19/2018   ALT 17 11/01/2017   AST 16 11/01/2017   NA 139 02/26/2019   K 4.3 02/26/2019   CL 105 02/26/2019   CREATININE 0.77 02/26/2019   BUN 13 02/26/2019   CO2 30 (H) 02/26/2019   TSH 0.39 08/07/2013   HGBA1C 8.0 (H) 11/23/2012    Other Studies Reviewed Today:  Echocardiogram 12/11/2017 Study Conclusions - Left ventricle: The cavity size was normal. Wall thickness was normal. Systolic function was normal. The estimated ejection fraction was in the range of 55% to 60%. Wall motion was normal; there were no regional wall motion abnormalities. Doppler parameters are consistent with abnormal left ventricular relaxation (grade 1 diastolic dysfunction).  Impressions: - Normal LV systolic function; mild diastolic dysfunction.  Assessment/Plan:  1. Chest pain - multiple risk factors atypical normal myovue 03/05/19 observe  3. Swelling - dependant edema from weight October 2020 CT negative for PE PRN lasix    4. GERD - f/u with [facility] dilated CBD for abdominal MRI 10/29/19   5. DM - per PCP - now on insulin   6.  HLD - on statin  7. COPD/asthma/emphysema - prior smoker. F/u Dr Elsworth Soho   8. COVID-19 Education: The signs and symptoms of COVID-19 were discussed with the patient and how to seek care for testing (follow up with PCP or arrange E-visit).  The importance of social distancing, staying at home, hand hygiene and wearing a mask when out in public were discussed today.  Current medicines are reviewed with the patient today.  The patient does not have concerns regarding medicines other than what has been noted above.  The following changes have been made:  PRN Lasix    No orders of the defined types were placed in this encounter.   Disposition:   F/U in a year   Time:  Spent reviewing chart myovue, pulmonary notes direct patient  interview and composing note 20 minutes   Signed: [name], MD  06/04/2020 12:38 PM  [facility] [address] [place],   [address] Phone: [phone] Fax: [phone]

## 2020-06-05 ENCOUNTER — Ambulatory Visit: Payer: PRIVATE HEALTH INSURANCE | Admitting: Cardiovascular Disease

## 2020-06-08 ENCOUNTER — Other Ambulatory Visit: Payer: Self-pay

## 2020-06-08 ENCOUNTER — Telehealth: Payer: PRIVATE HEALTH INSURANCE | Admitting: Cardiovascular Disease

## 2020-06-14 ENCOUNTER — Other Ambulatory Visit: Payer: Self-pay | Admitting: Pulmonary Disease

## 2020-06-24 ENCOUNTER — Telehealth: Payer: Self-pay | Admitting: Pulmonary Disease

## 2020-06-24 DIAGNOSIS — J4530 Mild persistent asthma, uncomplicated: Secondary | ICD-10-CM

## 2020-06-24 MED ORDER — MONTELUKAST SODIUM 10 MG PO TABS: ORAL_TABLET | ORAL | 3 refills | Status: DC

## 2020-06-24 MED ORDER — ALBUTEROL SULFATE HFA 108 (90 BASE) MCG/ACT IN AERS: 1.0000 | INHALATION_SPRAY | RESPIRATORY_TRACT | 3 refills | Status: DC | PRN

## 2020-06-24 NOTE — Telephone Encounter (Signed)
06/24/20  Called and spoke with patient.  Refilled albuterol inhaler.  Also refilled Singulair.  Encourage patient to keep upcoming appointment with Dr. Elsworth Soho in March/2022 for additional refills.  Patient agreed.  Patient has no acute respiratory concerns.  Nothing else needed.  Wyn Quaker, FNP

## 2020-06-24 NOTE — Progress Notes (Addendum)
Date:  06/24/2020    Veronica Rivers Date of Birth: 1965-11-07 Medical Record #[id number]  PCP:  Kristen Loader, FNP  Cardiologist:  Johnsie Cancel    HPI: Veronica Rivers is a 55 y.o. female  with a history of HLD, SVT, obesity, COPD and type 2 DM. No known CAD. Echo in 2019 with normal EF. In the hospital July 2020 with atypical chest pain - felt to be GI related. Has had costochondritis in the past also. Was to have stress testing - this was never completed - she cancelled along with her follow up here.   Her dyspnea is with and without exertion. Her chest pain comes and goes - with and without exertion - some palpable pain but also not. Says it is no different from her hospitalization back in July. She is now on insulin. Her weight continues to climb. She is a former smoker.   Seen by NP 02/25/19 to clear for EGD/colonoscopy Myovue 03/05/19 normal EF 63% Cleared to have endoscopy with Dr Cyndia Skeeters GI Had infection in esophagus and Rx with improvement Still needs f/u colonoscopy MRI abdomen June 2021 ok   She sees Dr Elsworth Soho for COPD 25 pack year quit in 1999 CT with apical  Emphysema  Rx with Breo and Zyzal worse with seasonal allergies   No cardiac complaints  Some esophageal spasm discussed using nitro for it Sees [facility] and my need another esophageal dilatation  Past Medical History:  Diagnosis Date  . Asthma   . Bipolar depression ([place])   . Cellulitis   . CHEST PAIN   . DM   . DYSPNEA   . Emphysema of lung ([place])   . HYPERCHOLESTEROLEMIA   . HYPERLIPIDEMIA   . HYPOTHYROIDISM   . Neuropathy   . SUPRAVENTRICULAR TACHYCARDIA     Past Surgical History:  Procedure Laterality Date  . CESAREAN SECTION    . FOOT SURGERY    . NASAL SEPTUM SURGERY    . NECK SURGERY    . TONSILLECTOMY    . TUBAL LIGATION       Medications: No outpatient medications have been marked as taking for the 06/30/20 encounter (Appointment) with Josue Hector, MD.      Allergies: Allergies  Allergen Reactions  . Omeprazole     Made her breathing worse.  . Meloxicam Other (See Comments)    Pt stated, "Got Burning, Acid Reflux with medicine" Pt should not be on any anti-inflammatories d/t elevated LFTs  . Zocor [Simvastatin] Other (See Comments)    Caused muscle pain  . Sulfa Drugs Cross Reactors Rash    Social History: The patient  reports that she quit smoking about 22 years ago. Her smoking use included cigarettes. She quit after 15.00 years of use. She has never used smokeless tobacco. She reports that she does not drink alcohol and does not use drugs.   Family History: The patient's family history includes COPD in her mother; Cancer in her father; Heart attack in her mother; Heart disease in her mother; Hypertension in her mother.   Review of Systems: Please see the history of present illness.   All other systems are reviewed and negative.   Physical Exam: VS:  There were no vitals taken for this visit. [name]  BMI There is no height or weight on file to calculate BMI.  Wt Readings from Last 3 Encounters:  10/21/19 113.9 kg  03/04/19 116.1 kg  02/25/19 116.1 kg  Affect appropriate Healthy:  appears stated age 19: normal Neck supple with no adenopathy JVP normal no bruits no thyromegaly Lungs clear with no wheezing and good diaphragmatic motion Heart:  S1/S2 no murmur, no rub, gallop or click PMI normal Abdomen: benighn, BS positve, no tenderness, no AAA no bruit.  No HSM or HJR Distal pulses intact with no bruits No edema Neuro non-focal Skin warm and dry No muscular weakness   LABORATORY DATA:  EKG:   SR ICRBBB nonspecific ST changes 02/25/19 06/30/2020 SR rate 67 nonspecific ST changes   Lab Results  Component Value Date   WBC 5.9 12/06/2018   HGB 15.6 (H) 12/06/2018   HCT 49.5 (H) 12/06/2018   PLT 251 12/06/2018   GLUCOSE 172 (H) 02/26/2019   CHOL 224 (H) 12/19/2018   TRIG 112 12/19/2018   HDL 65 12/19/2018    LDLCALC 137 (H) 12/19/2018   ALT 17 11/01/2017   AST 16 11/01/2017   NA 139 02/26/2019   K 4.3 02/26/2019   CL 105 02/26/2019   CREATININE 0.77 02/26/2019   BUN 13 02/26/2019   CO2 30 (H) 02/26/2019   TSH 0.39 08/07/2013   HGBA1C 8.0 (H) 11/23/2012    Other Studies Reviewed Today:  Myovue: April 04, 2019  Study Highlights    There was no ST segment deviation noted during stress.  Nuclear stress EF: 63%.  The left ventricular ejection fraction is normal (55-65%).  The study is normal.  This is a low risk study.   Fixed apical inferolateral defect with normal wall motion in that region, suggestive of artifact   Echocardiogram 12/11/2017 Study Conclusions - Left ventricle: The cavity size was normal. Wall thickness was normal. Systolic function was normal. The estimated ejection fraction was in the range of 55% to 60%. Wall motion was normal; there were no regional wall motion abnormalities. Doppler parameters are consistent with abnormal left ventricular relaxation (grade 1 diastolic dysfunction).  Impressions: - Normal LV systolic function; mild diastolic dysfunction.  Assessment/Plan:  1. Chest pain - multiple risk factors atypical normal myovue 2019-04-04 observe  3. Swelling - dependant edema from weight October 2020 CT negative for PE PRN lasix    4. GERD - f/u with [facility] dilated CBD [place] MRI normal 03/07/20   5. DM - per PCP - now on insulin   6.  HLD - on statin  7. COPD/asthma/emphysema - prior smoker. F/u Dr Elsworth Soho   8. COVID-19 Education: The signs and symptoms of COVID-19 were discussed with the patient and how to seek care for testing (follow up with PCP or arrange E-visit).  The importance of social distancing, staying at home, hand hygiene and wearing a mask when out in public were discussed today.  Current medicines are reviewed with the patient today.  The patient does not have concerns regarding medicines other than what has been noted  above.  The following changes have been made:  PRN Lasix    No orders of the defined types were placed in this encounter.   Disposition:   F/U in a year   Time:  Spent reviewing chart myovue,echo  pulmonary notes direct patient interview and composing note 20 minutes   Signed: [name], MD  06/24/2020 12:09 PM  [place] [address] [place], [place]  [address] Phone: [phone] Fax: [phone]

## 2020-06-30 ENCOUNTER — Other Ambulatory Visit: Payer: Self-pay

## 2020-06-30 ENCOUNTER — Ambulatory Visit: Payer: PRIVATE HEALTH INSURANCE | Admitting: Cardiovascular Disease

## 2020-06-30 ENCOUNTER — Encounter: Payer: Self-pay | Admitting: Cardiovascular Disease

## 2020-06-30 VITALS — BP 120/86 | HR 67 | Ht 70.0 in | Wt 244.6 lb

## 2020-06-30 DIAGNOSIS — I471 Supraventricular tachycardia: Secondary | ICD-10-CM

## 2020-06-30 DIAGNOSIS — R079 Chest pain, unspecified: Secondary | ICD-10-CM | POA: Diagnosis not present

## 2020-06-30 MED ORDER — NITROGLYCERIN 0.4 MG SL SUBL: 0.4000 mg | SUBLINGUAL_TABLET | SUBLINGUAL | 3 refills | Status: AC | PRN

## 2020-06-30 NOTE — Addendum Note (Signed)
Addended by: Jacinta Shoe on: 06/30/2020 11:28 AM   Modules accepted: Orders

## 2020-06-30 NOTE — Addendum Note (Signed)
Addended by: Devra Dopp E on: 06/30/2020 11:40 AM   Modules accepted: Orders

## 2020-06-30 NOTE — Patient Instructions (Addendum)
Medication Instructions:  NO CHANGES *If you need a refill on your cardiac medications before your next appointment, please call your pharmacy*   Lab Work: NONE If you have labs (blood work) drawn today and your tests are completely normal, you will receive your results only by: [name] MyChart Message (if you have MyChart) OR . A paper copy in the mail If you have any lab test that is abnormal or we need to change your treatment, we will call you to review the results.   Testing/Procedures: NONE   Follow-Up: At [facility], you and your health needs are our priority.  As part of our continuing mission to provide you with exceptional heart care, we have created designated Provider Care Teams.  These Care Teams include your primary Cardiologist (physician) and Advanced Practice Providers (APPs -  Physician Assistants and Nurse Practitioners) who all work together to provide you with the care you need, when you need it.  We recommend signing up for the patient portal called "MyChart".  Sign up information is provided on this After Visit Summary.  MyChart is used to connect with patients for Virtual Visits (Telemedicine).  Patients are able to view lab/test results, encounter notes, upcoming appointments, etc.  Non-urgent messages can be sent to your provider as well.   To learn more about what you can do with MyChart, go to [email or web address].    Your next appointment:   6 month(s)  The format for your next appointment:   In Person  Provider:   [name], MD

## 2020-07-17 ENCOUNTER — Encounter: Payer: Self-pay | Admitting: Pulmonary Disease

## 2020-07-17 ENCOUNTER — Other Ambulatory Visit: Payer: Self-pay

## 2020-07-17 ENCOUNTER — Ambulatory Visit: Payer: PRIVATE HEALTH INSURANCE | Admitting: Pulmonary Disease

## 2020-07-17 DIAGNOSIS — J432 Centrilobular emphysema: Secondary | ICD-10-CM | POA: Diagnosis not present

## 2020-07-17 DIAGNOSIS — J4530 Mild persistent asthma, uncomplicated: Secondary | ICD-10-CM | POA: Diagnosis not present

## 2020-07-17 MED ORDER — MONTELUKAST SODIUM 10 MG PO TABS: ORAL_TABLET | ORAL | 3 refills | Status: DC

## 2020-07-17 MED ORDER — BREO ELLIPTA 200-25 MCG/INH IN AEPB: 1.0000 | INHALATION_SPRAY | Freq: Every day | RESPIRATORY_TRACT | 5 refills | Status: DC

## 2020-07-17 MED ORDER — LEVOCETIRIZINE DIHYDROCHLORIDE 5 MG PO TABS: ORAL_TABLET | ORAL | 6 refills | Status: DC

## 2020-07-17 NOTE — Assessment & Plan Note (Signed)
Her asthma appears to be well controlled on Breo, Singulair She does have seasonal allergies and we will continue Xyzal during spring and fall. I feel that her esophageal issues may contribute to worsening. If she remains stable over the next year we will consider stepdown from [facility] to inhaled steroid alone

## 2020-07-17 NOTE — Assessment & Plan Note (Addendum)
She reports mild desaturation on sleep study that was done with [facility] medical, will obtain a copy and review .  Her emphysema seems to be mild so I doubt that she would need oxygen

## 2020-07-17 NOTE — Patient Instructions (Signed)
   Refills on Breo, Singulair and Xyzal. We will obtain sleep study from [facility] and advise you about oxygen desaturation

## 2020-07-17 NOTE — Progress Notes (Signed)
   Subjective:    Patient ID: Veronica Rivers, female    DOB: 17-Feb-1966, 55 y.o.   MRN: [id number]  HPI  55 yo remote smokerfor FUof emphysema. She smoked about 1.5 packs/day before she quit in 99 about 25 pack years.  She reports a diagnosis of asthma in her 74s triggered by exercise and weather changes and chest colds. She underwent detailed allergy testing which was negative.4Cats + 1 dogin the house.  Meds - She was on Symbicort x years, changed to [facility] in 2017. Incruse was stopped after her last visit 04/2018 showing good PFTs  PMH -esophageal spasm , chest pain >> negative stress test and CT angiogram.    Last office visit 04/2019 She has flareups during spring and fall .  She is requiring increased albuterol over the past few weeks.  She has a history of esophageal spasm and is due to see GI for another dilation procedure.  She has unexplained nausea and vomiting, I note that MRCP was normal. When she had home sleep test done at [place] and was told that she has very mild OSA that does not require CPAP therapy, she was placed on methylphenidate for excessive daytime somnolence she takes this 20 twice daily and this seems to have helped.  She was also told that she has oxygen saturation dropped to the 70s in her sleep and she would like me to review  Significant tests/ events reviewed 02/2019 CT angiogram negative 6/2019CT angiogram-noevidence of pulmonary embolus,changes of bullousemphysema in apices  04/2018 PFTs ratio normal, FEV1 82%, TLC 89%, DLCO 85%  Review of Systems neg for any significant sore throat, dysphagia, itching, sneezing, nasal congestion or excess/ purulent secretions, fever, chills, sweats, unintended wt loss, pleuritic or exertional cp, hempoptysis, orthopnea pnd or change in chronic leg swelling. Also denies presyncope, palpitations, heartburn, abdominal pain, nausea, vomiting, diarrhea or change in bowel or urinary habits,  dysuria,hematuria, rash, arthralgias, visual complaints, headache, numbness weakness or ataxia.     Objective:   Physical Exam  Gen. Pleasant, obese, in no distress ENT - no lesions, no post nasal drip Neck: No JVD, no thyromegaly, no carotid bruits Lungs: no use of accessory muscles, no dullness to percussion, decreased without rales or rhonchi  Cardiovascular: Rhythm regular, heart sounds  normal, no murmurs or gallops, no peripheral edema Musculoskeletal: No deformities, no cyanosis or clubbing , no tremors        Assessment & Plan:

## 2020-09-01 ENCOUNTER — Ambulatory Visit: Payer: 59 | Admitting: Podiatry

## 2020-09-04 ENCOUNTER — Telehealth: Payer: Self-pay | Admitting: Pulmonary Disease

## 2020-09-04 MED ORDER — PREDNISONE 10 MG PO TABS: ORAL_TABLET | ORAL | 0 refills | Status: DC

## 2020-09-04 MED ORDER — AZITHROMYCIN 250 MG PO TABS: ORAL_TABLET | ORAL | 0 refills | Status: DC

## 2020-09-04 NOTE — Telephone Encounter (Signed)
Spoke with the pt  She states that she has been coughing for about a wk  Started with PND and runny nose  She then developed a stomach virus that lasted a couple of days with vomiting and diarrhea  GI issues are better now, but her cough has become prod with green sputum over the past 2 days  She states had to use her rescue inhaler last night b/c she was SOB and wheezing  Denies f/c/s, body aches  She has had covid vax x 3  Please advise thanks  Allergies  Allergen Reactions  . Omeprazole     Made her breathing worse.  . Meloxicam Other (See Comments)    Pt stated, "Got Burning, Acid Reflux with medicine" Pt should not be on any anti-inflammatories d/t elevated LFTs  . Zocor [Simvastatin] Other (See Comments)    Caused muscle pain  . Sulfa Drugs Cross Reactors Rash

## 2020-09-04 NOTE — Telephone Encounter (Signed)
Spoke with pt, aware of recs.  rx's sent to preferred pharmacy.  Nothing further needed at this time- will close encounter.    

## 2020-09-04 NOTE — Telephone Encounter (Signed)
pt is calling because she is coughing up phlegm, believes her asthma is flaring up, & is experiencing allergies . Mentions that she was had a stomach bug that went away around 4/26 that causes her to vomit which might be the reason for the soreness in her chest.  pt uses [facility] [address] please advise [phone]

## 2020-09-04 NOTE — Telephone Encounter (Signed)
Z-pak Prednisone 10 mg tabs  Take 2 tabs daily with food x 5ds, then 1 tab daily with food x 5ds then STOP  

## 2020-09-15 ENCOUNTER — Other Ambulatory Visit: Payer: Self-pay

## 2020-09-15 ENCOUNTER — Ambulatory Visit: Payer: PRIVATE HEALTH INSURANCE | Admitting: Podiatry

## 2020-09-15 ENCOUNTER — Encounter: Payer: Self-pay | Admitting: Podiatry

## 2020-09-15 ENCOUNTER — Ambulatory Visit: Payer: PRIVATE HEALTH INSURANCE

## 2020-09-15 DIAGNOSIS — G8929 Other chronic pain: Secondary | ICD-10-CM | POA: Insufficient documentation

## 2020-09-15 DIAGNOSIS — G479 Sleep disorder, unspecified: Secondary | ICD-10-CM | POA: Insufficient documentation

## 2020-09-15 DIAGNOSIS — R9389 Abnormal findings on diagnostic imaging of other specified body structures: Secondary | ICD-10-CM | POA: Insufficient documentation

## 2020-09-15 DIAGNOSIS — E782 Mixed hyperlipidemia: Secondary | ICD-10-CM | POA: Insufficient documentation

## 2020-09-15 DIAGNOSIS — M549 Dorsalgia, unspecified: Secondary | ICD-10-CM | POA: Insufficient documentation

## 2020-09-15 DIAGNOSIS — F319 Bipolar disorder, unspecified: Secondary | ICD-10-CM | POA: Insufficient documentation

## 2020-09-15 DIAGNOSIS — G4733 Obstructive sleep apnea (adult) (pediatric): Secondary | ICD-10-CM | POA: Insufficient documentation

## 2020-09-15 DIAGNOSIS — E669 Obesity, unspecified: Secondary | ICD-10-CM | POA: Insufficient documentation

## 2020-09-15 DIAGNOSIS — M179 Osteoarthritis of knee, unspecified: Secondary | ICD-10-CM | POA: Insufficient documentation

## 2020-09-15 DIAGNOSIS — R5383 Other fatigue: Secondary | ICD-10-CM | POA: Insufficient documentation

## 2020-09-15 DIAGNOSIS — Z79899 Other long term (current) drug therapy: Secondary | ICD-10-CM | POA: Insufficient documentation

## 2020-09-15 DIAGNOSIS — J45909 Unspecified asthma, uncomplicated: Secondary | ICD-10-CM | POA: Insufficient documentation

## 2020-09-15 DIAGNOSIS — E049 Nontoxic goiter, unspecified: Secondary | ICD-10-CM | POA: Insufficient documentation

## 2020-09-15 DIAGNOSIS — R400 Somnolence: Secondary | ICD-10-CM | POA: Insufficient documentation

## 2020-09-15 DIAGNOSIS — J309 Allergic rhinitis, unspecified: Secondary | ICD-10-CM | POA: Insufficient documentation

## 2020-09-15 DIAGNOSIS — M1710 Unilateral primary osteoarthritis, unspecified knee: Secondary | ICD-10-CM | POA: Insufficient documentation

## 2020-09-15 DIAGNOSIS — D2371 Other benign neoplasm of skin of right lower limb, including hip: Secondary | ICD-10-CM

## 2020-09-15 DIAGNOSIS — K760 Fatty (change of) liver, not elsewhere classified: Secondary | ICD-10-CM | POA: Insufficient documentation

## 2020-09-15 DIAGNOSIS — R1319 Other dysphagia: Secondary | ICD-10-CM | POA: Insufficient documentation

## 2020-09-15 DIAGNOSIS — E1165 Type 2 diabetes mellitus with hyperglycemia: Secondary | ICD-10-CM | POA: Insufficient documentation

## 2020-09-15 DIAGNOSIS — M7751 Other enthesopathy of right foot: Secondary | ICD-10-CM

## 2020-09-15 DIAGNOSIS — J4541 Moderate persistent asthma with (acute) exacerbation: Secondary | ICD-10-CM | POA: Insufficient documentation

## 2020-09-15 DIAGNOSIS — M519 Unspecified thoracic, thoracolumbar and lumbosacral intervertebral disc disorder: Secondary | ICD-10-CM | POA: Insufficient documentation

## 2020-09-15 DIAGNOSIS — E559 Vitamin D deficiency, unspecified: Secondary | ICD-10-CM | POA: Insufficient documentation

## 2020-09-15 MED ORDER — DEXAMETHASONE SODIUM PHOSPHATE 120 MG/30ML IJ SOLN
2.0000 mg | Freq: Once | INTRAMUSCULAR | Status: AC
  Administered 2020-09-15: 2 mg via INTRA_ARTICULAR

## 2020-09-15 NOTE — Progress Notes (Signed)
She presents today chief complaint of painful lesion beneath the fifth metatarsal of the right foot.  I need you to take this core out again as she says she has had it for about 2 years but it has not really bothered her since we worked on it last until just the other week.  Objective: Vital signs are stable she alert and oriented x3.  Pulses are palpable.  The fifth metatarsal head of the right foot demonstrates an area of fluctuance with an overlying porokeratotic lesion.  No other open lesions or wounds are noted.  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  Plan: I injected the bursa today with 2 mg of dexamethasone local anesthetic after sterile Betadine skin prep I then did debride the reactive hyperkeratotic tissue and I will follow-up with her and a few weeks if needed.

## 2020-10-21 ENCOUNTER — Encounter (HOSPITAL_COMMUNITY): Payer: Self-pay

## 2020-10-21 ENCOUNTER — Emergency Department (HOSPITAL_COMMUNITY)
Admission: EM | Admit: 2020-10-21 | Discharge: 2020-10-21 | Disposition: A | Discharge status: Home / Self Care | Payer: PRIVATE HEALTH INSURANCE | Source: Home / Self Care

## 2020-10-21 ENCOUNTER — Other Ambulatory Visit: Payer: Self-pay

## 2020-10-21 DIAGNOSIS — K625 Hemorrhage of anus and rectum: Secondary | ICD-10-CM | POA: Insufficient documentation

## 2020-10-21 DIAGNOSIS — R1110 Vomiting, unspecified: Secondary | ICD-10-CM | POA: Insufficient documentation

## 2020-10-21 DIAGNOSIS — K921 Melena: Secondary | ICD-10-CM | POA: Diagnosis not present

## 2020-10-21 DIAGNOSIS — Z5321 Procedure and treatment not carried out due to patient leaving prior to being seen by health care provider: Secondary | ICD-10-CM | POA: Insufficient documentation

## 2020-10-21 NOTE — ED Notes (Signed)
PT states "I dont want treatment I am leaving", tried to explain to pt importance of orders. Patient refused and left

## 2020-10-21 NOTE — ED Triage Notes (Signed)
Colonoscopy and EGD at 0845 and now having rectal bleeding with going to restroom bright red blood and vomiting immediately after

## 2020-10-21 NOTE — ED Provider Notes (Addendum)
Emergency Medicine Provider Triage Evaluation Note  Veronica Rivers , a 55 y.o. female  was evaluated in triage.  Pt complains of rectal bleeding, nausea, vomiting, abdominal pain following colonoscopy performed this morning.  Denies anticoagulation.  Review of Systems  Positive: Nausea, vomiting, abdominal pain, rectal bleeding Negative: Fever, hematemesis, chest pain, shortness of breath  Physical Exam  BP (!) 166/84   Pulse 64   Temp 98 F (36.7 C)   Resp 18   Ht 5\' 9"  (1.753 m)   Wt 108.9 kg   SpO2 98%   BMI 35.44 kg/m  Gen:   Awake, no distress   Resp:  Normal effort  MSK:   Moves extremities without difficulty  Other:  Vague left lower quadrant pain and verbalized tenderness  Medical Decision Making  Medically screening exam initiated at 4:26 PM.  Appropriate orders placed.  Veronica Rivers was informed that the remainder of the evaluation will be completed by another provider, this initial triage assessment does not replace that evaluation, and the importance of remaining in the ED until their evaluation is complete.   This patient was evaluated during a time of global shortage of iodinated contrast media. Based on guidance from the [employer] of Radiology, best practices, and local institutional approaches an alternative path for evaluating and managing the patient may have been employed in order to provide optimal care during this shortage. The current situation has been discussed with the patient.   Lorayne Bender, PA-C 10/21/20 1643    Lorayne Bender, PA-C 10/21/20 1643    Lajean Saver, MD 10/22/20 1650

## 2020-10-23 ENCOUNTER — Encounter (HOSPITAL_COMMUNITY): Payer: Self-pay

## 2020-10-23 ENCOUNTER — Other Ambulatory Visit: Payer: Self-pay

## 2020-10-23 ENCOUNTER — Inpatient Hospital Stay (HOSPITAL_COMMUNITY)
Admission: EM | Admit: 2020-10-23 | Discharge: 2020-10-25 | DRG: 378 | Disposition: A | Discharge status: Home / Self Care | Payer: PRIVATE HEALTH INSURANCE | Attending: Internal Medicine | Admitting: Internal Medicine

## 2020-10-23 DIAGNOSIS — Z881 Allergy status to other antibiotic agents status: Secondary | ICD-10-CM | POA: Diagnosis not present

## 2020-10-23 DIAGNOSIS — F1290 Cannabis use, unspecified, uncomplicated: Secondary | ICD-10-CM | POA: Diagnosis present

## 2020-10-23 DIAGNOSIS — K625 Hemorrhage of anus and rectum: Secondary | ICD-10-CM | POA: Diagnosis present

## 2020-10-23 DIAGNOSIS — Z888 Allergy status to other drugs, medicaments and biological substances status: Secondary | ICD-10-CM

## 2020-10-23 DIAGNOSIS — J45909 Unspecified asthma, uncomplicated: Secondary | ICD-10-CM | POA: Diagnosis present

## 2020-10-23 DIAGNOSIS — R112 Nausea with vomiting, unspecified: Secondary | ICD-10-CM

## 2020-10-23 DIAGNOSIS — J439 Emphysema, unspecified: Secondary | ICD-10-CM | POA: Diagnosis present

## 2020-10-23 DIAGNOSIS — Y838 Other surgical procedures as the cause of abnormal reaction of the patient, or of later complication, without mention of misadventure at the time of the procedure: Secondary | ICD-10-CM | POA: Diagnosis present

## 2020-10-23 DIAGNOSIS — F3130 Bipolar disorder, current episode depressed, mild or moderate severity, unspecified: Secondary | ICD-10-CM | POA: Diagnosis present

## 2020-10-23 DIAGNOSIS — Z79899 Other long term (current) drug therapy: Secondary | ICD-10-CM

## 2020-10-23 DIAGNOSIS — Z6835 Body mass index (BMI) 35.0-35.9, adult: Secondary | ICD-10-CM | POA: Diagnosis not present

## 2020-10-23 DIAGNOSIS — Z882 Allergy status to sulfonamides status: Secondary | ICD-10-CM | POA: Diagnosis not present

## 2020-10-23 DIAGNOSIS — Z79890 Hormone replacement therapy (postmenopausal): Secondary | ICD-10-CM | POA: Diagnosis not present

## 2020-10-23 DIAGNOSIS — E785 Hyperlipidemia, unspecified: Secondary | ICD-10-CM | POA: Diagnosis present

## 2020-10-23 DIAGNOSIS — Z8249 Family history of ischemic heart disease and other diseases of the circulatory system: Secondary | ICD-10-CM | POA: Diagnosis not present

## 2020-10-23 DIAGNOSIS — I10 Essential (primary) hypertension: Secondary | ICD-10-CM | POA: Diagnosis present

## 2020-10-23 DIAGNOSIS — E1165 Type 2 diabetes mellitus with hyperglycemia: Secondary | ICD-10-CM

## 2020-10-23 DIAGNOSIS — D62 Acute posthemorrhagic anemia: Secondary | ICD-10-CM | POA: Diagnosis present

## 2020-10-23 DIAGNOSIS — E039 Hypothyroidism, unspecified: Secondary | ICD-10-CM | POA: Diagnosis present

## 2020-10-23 DIAGNOSIS — I471 Supraventricular tachycardia: Secondary | ICD-10-CM | POA: Diagnosis present

## 2020-10-23 DIAGNOSIS — K922 Gastrointestinal hemorrhage, unspecified: Secondary | ICD-10-CM | POA: Diagnosis not present

## 2020-10-23 DIAGNOSIS — K921 Melena: Secondary | ICD-10-CM | POA: Diagnosis present

## 2020-10-23 DIAGNOSIS — E7800 Pure hypercholesterolemia, unspecified: Secondary | ICD-10-CM | POA: Diagnosis present

## 2020-10-23 DIAGNOSIS — Z825 Family history of asthma and other chronic lower respiratory diseases: Secondary | ICD-10-CM | POA: Diagnosis not present

## 2020-10-23 DIAGNOSIS — K91840 Postprocedural hemorrhage and hematoma of a digestive system organ or structure following a digestive system procedure: Secondary | ICD-10-CM | POA: Diagnosis present

## 2020-10-23 DIAGNOSIS — B3781 Candidal esophagitis: Secondary | ICD-10-CM | POA: Diagnosis present

## 2020-10-23 DIAGNOSIS — Z87891 Personal history of nicotine dependence: Secondary | ICD-10-CM | POA: Diagnosis not present

## 2020-10-23 DIAGNOSIS — K621 Rectal polyp: Secondary | ICD-10-CM | POA: Diagnosis present

## 2020-10-23 DIAGNOSIS — Z8719 Personal history of other diseases of the digestive system: Secondary | ICD-10-CM

## 2020-10-23 DIAGNOSIS — Z809 Family history of malignant neoplasm, unspecified: Secondary | ICD-10-CM

## 2020-10-23 DIAGNOSIS — E876 Hypokalemia: Secondary | ICD-10-CM | POA: Diagnosis present

## 2020-10-23 DIAGNOSIS — K5730 Diverticulosis of large intestine without perforation or abscess without bleeding: Secondary | ICD-10-CM | POA: Diagnosis present

## 2020-10-23 DIAGNOSIS — D649 Anemia, unspecified: Secondary | ICD-10-CM | POA: Diagnosis not present

## 2020-10-23 DIAGNOSIS — G894 Chronic pain syndrome: Secondary | ICD-10-CM | POA: Diagnosis present

## 2020-10-23 DIAGNOSIS — Z20822 Contact with and (suspected) exposure to covid-19: Secondary | ICD-10-CM | POA: Diagnosis present

## 2020-10-23 DIAGNOSIS — K648 Other hemorrhoids: Secondary | ICD-10-CM | POA: Diagnosis present

## 2020-10-23 DIAGNOSIS — Z7984 Long term (current) use of oral hypoglycemic drugs: Secondary | ICD-10-CM

## 2020-10-23 DIAGNOSIS — Z79891 Long term (current) use of opiate analgesic: Secondary | ICD-10-CM

## 2020-10-23 LAB — CBC WITH DIFFERENTIAL/PLATELET
Abs Immature Granulocytes: 0.08 10*3/uL — ABNORMAL HIGH (ref 0.00–0.07)
Basophils Absolute: 0 10*3/uL (ref 0.0–0.1)
Basophils Relative: 0 %
Eosinophils Absolute: 0 10*3/uL (ref 0.0–0.5)
Eosinophils Relative: 0 %
HCT: 22.3 % — ABNORMAL LOW (ref 36.0–46.0)
Hemoglobin: 7.7 g/dL — ABNORMAL LOW (ref 12.0–15.0)
Immature Granulocytes: 1 %
Lymphocytes Relative: 22 %
Lymphs Abs: 2.5 10*3/uL (ref 0.7–4.0)
MCH: 32.5 pg (ref 26.0–34.0)
MCHC: 34.5 g/dL (ref 30.0–36.0)
MCV: 94.1 fL (ref 80.0–100.0)
Monocytes Absolute: 0.7 10*3/uL (ref 0.1–1.0)
Monocytes Relative: 6 %
Neutro Abs: 8.3 10*3/uL — ABNORMAL HIGH (ref 1.7–7.7)
Neutrophils Relative %: 71 %
Platelets: 266 10*3/uL (ref 150–400)
RBC: 2.37 MIL/uL — ABNORMAL LOW (ref 3.87–5.11)
RDW: 12.9 % (ref 11.5–15.5)
WBC: 11.6 10*3/uL — ABNORMAL HIGH (ref 4.0–10.5)
nRBC: 0 % (ref 0.0–0.2)

## 2020-10-23 LAB — COMPREHENSIVE METABOLIC PANEL
ALT: 19 U/L (ref 0–44)
AST: 14 U/L — ABNORMAL LOW (ref 15–41)
Albumin: 3.4 g/dL — ABNORMAL LOW (ref 3.5–5.0)
Alkaline Phosphatase: 49 U/L (ref 38–126)
Anion gap: 9 (ref 5–15)
BUN: 6 mg/dL (ref 6–20)
CO2: 27 mmol/L (ref 22–32)
Calcium: 8.2 mg/dL — ABNORMAL LOW (ref 8.9–10.3)
Chloride: 96 mmol/L — ABNORMAL LOW (ref 98–111)
Creatinine, Ser: 0.68 mg/dL (ref 0.44–1.00)
GFR, Estimated: >60 mL/min (ref >60)
Glucose, Bld: 267 mg/dL — ABNORMAL HIGH (ref 70–99)
Potassium: 3.3 mmol/L — ABNORMAL LOW (ref 3.5–5.1)
Sodium: 132 mmol/L — ABNORMAL LOW (ref 135–145)
Total Bilirubin: 0.5 mg/dL (ref 0.3–1.2)
Total Protein: 5.6 g/dL — ABNORMAL LOW (ref 6.5–8.1)

## 2020-10-23 LAB — RESP PANEL BY RT-PCR (FLU A&B, COVID) ARPGX2
Influenza A by PCR: NEGATIVE
Influenza B by PCR: NEGATIVE
SARS Coronavirus 2 by RT PCR: NEGATIVE

## 2020-10-23 LAB — GLUCOSE, CAPILLARY
Glucose-Capillary: 219 mg/dL — ABNORMAL HIGH (ref 70–99)
Glucose-Capillary: 217 mg/dL — ABNORMAL HIGH (ref 70–99)

## 2020-10-23 LAB — HEMOGLOBIN AND HEMATOCRIT, BLOOD
HCT: 21.1 % — ABNORMAL LOW (ref 36.0–46.0)
Hemoglobin: 7.2 g/dL — ABNORMAL LOW (ref 12.0–15.0)

## 2020-10-23 LAB — MAGNESIUM: Magnesium: 1.8 mg/dL (ref 1.7–2.4)

## 2020-10-23 LAB — ABO/RH: ABO/RH(D): O POS

## 2020-10-23 LAB — CBG MONITORING, ED: Glucose-Capillary: 221 mg/dL — ABNORMAL HIGH (ref 70–99)

## 2020-10-23 MED ORDER — FLUCONAZOLE 200 MG PO TABS
200.0000 mg | ORAL_TABLET | Freq: Every day | ORAL | Status: DC
  Administered 2020-10-23 – 2020-10-24 (×2): 200 mg via ORAL
  Filled 2020-10-23 (×3): qty 1

## 2020-10-23 MED ORDER — INSULIN ASPART 100 UNIT/ML IJ SOLN
0.0000 [IU] | Freq: Three times a day (TID) | INTRAMUSCULAR | Status: DC
Start: 2020-10-23 — End: 2020-10-25
  Administered 2020-10-23: 5 [IU] via SUBCUTANEOUS
  Filled 2020-10-23: qty 0.15

## 2020-10-23 MED ORDER — CARVEDILOL 12.5 MG PO TABS
12.5000 mg | ORAL_TABLET | Freq: Two times a day (BID) | ORAL | Status: DC
  Administered 2020-10-23 – 2020-10-24 (×2): 12.5 mg via ORAL
  Filled 2020-10-23 (×2): qty 1

## 2020-10-23 MED ORDER — SODIUM CHLORIDE 0.9 % IV BOLUS
1000.0000 mL | Freq: Once | INTRAVENOUS | Status: AC
  Administered 2020-10-23: 1000 mL via INTRAVENOUS

## 2020-10-23 MED ORDER — PROMETHAZINE HCL 25 MG/ML IJ SOLN
12.5000 mg | Freq: Four times a day (QID) | INTRAMUSCULAR | Status: AC
  Administered 2020-10-23 – 2020-10-24 (×4): 12.5 mg via INTRAVENOUS
  Filled 2020-10-23 (×4): qty 12.5

## 2020-10-23 MED ORDER — HYDRALAZINE HCL 25 MG PO TABS
25.0000 mg | ORAL_TABLET | Freq: Four times a day (QID) | ORAL | Status: DC | PRN
  Administered 2020-10-24: 25 mg via ORAL
  Filled 2020-10-23: qty 1

## 2020-10-23 MED ORDER — LORATADINE 10 MG PO TABS
10.0000 mg | ORAL_TABLET | Freq: Every day | ORAL | Status: DC
  Administered 2020-10-23 – 2020-10-25 (×2): 10 mg via ORAL
  Filled 2020-10-23 (×2): qty 1

## 2020-10-23 MED ORDER — FLUTICASONE FUROATE-VILANTEROL 200-25 MCG/INH IN AEPB
1.0000 | INHALATION_SPRAY | Freq: Every day | RESPIRATORY_TRACT | Status: DC
  Filled 2020-10-23: qty 28

## 2020-10-23 MED ORDER — ONDANSETRON HCL 4 MG/2ML IJ SOLN
4.0000 mg | INTRAMUSCULAR | Status: DC
  Administered 2020-10-23 – 2020-10-25 (×9): 4 mg via INTRAVENOUS
  Filled 2020-10-23 (×10): qty 2

## 2020-10-23 MED ORDER — MONTELUKAST SODIUM 10 MG PO TABS
10.0000 mg | ORAL_TABLET | Freq: Every day | ORAL | Status: DC
  Administered 2020-10-23 – 2020-10-24 (×2): 10 mg via ORAL
  Filled 2020-10-23 (×2): qty 1

## 2020-10-23 MED ORDER — LEVOTHYROXINE SODIUM 125 MCG PO TABS
125.0000 ug | ORAL_TABLET | Freq: Every day | ORAL | Status: DC
  Administered 2020-10-24 – 2020-10-25 (×2): 125 ug via ORAL
  Filled 2020-10-23 (×2): qty 1

## 2020-10-23 MED ORDER — SODIUM CHLORIDE 0.9 % IV SOLN
25.0000 mg | Freq: Four times a day (QID) | INTRAVENOUS | Status: DC | PRN
  Administered 2020-10-23: 25 mg via INTRAVENOUS
  Filled 2020-10-23: qty 25

## 2020-10-23 MED ORDER — ALBUTEROL SULFATE HFA 108 (90 BASE) MCG/ACT IN AERS: 1.0000 | INHALATION_SPRAY | RESPIRATORY_TRACT | Status: DC | PRN

## 2020-10-23 MED ORDER — SODIUM CHLORIDE 0.9% FLUSH
3.0000 mL | Freq: Two times a day (BID) | INTRAVENOUS | Status: DC
  Administered 2020-10-23 – 2020-10-24 (×3): 3 mL via INTRAVENOUS

## 2020-10-23 MED ORDER — OXYCODONE HCL 5 MG PO TABS
5.0000 mg | ORAL_TABLET | Freq: Every day | ORAL | Status: DC | PRN
  Administered 2020-10-23 – 2020-10-25 (×3): 5 mg via ORAL
  Filled 2020-10-23 (×3): qty 1

## 2020-10-23 MED ORDER — OXYCODONE-ACETAMINOPHEN 5-325 MG PO TABS
1.0000 | ORAL_TABLET | Freq: Every day | ORAL | Status: DC | PRN
  Administered 2020-10-24 – 2020-10-25 (×4): 1 via ORAL
  Filled 2020-10-23 (×4): qty 1

## 2020-10-23 MED ORDER — SODIUM CHLORIDE 0.9 % IV SOLN: 250.0000 mL | INTRAVENOUS | Status: DC | PRN

## 2020-10-23 MED ORDER — ARIPIPRAZOLE 5 MG PO TABS
15.0000 mg | ORAL_TABLET | Freq: Every day | ORAL | Status: DC
  Administered 2020-10-23: 15 mg via ORAL
  Filled 2020-10-23 (×3): qty 1

## 2020-10-23 MED ORDER — PEG-KCL-NACL-NASULF-NA ASC-C 100 G PO SOLR
1.0000 | Freq: Once | ORAL | Status: AC
  Administered 2020-10-23: 200 g via ORAL
  Filled 2020-10-23: qty 1

## 2020-10-23 MED ORDER — OXYCODONE-ACETAMINOPHEN 10-325 MG PO TABS: 1.0000 | ORAL_TABLET | Freq: Every day | ORAL | Status: DC

## 2020-10-23 MED ORDER — OXYCODONE-ACETAMINOPHEN 5-325 MG PO TABS
2.0000 | ORAL_TABLET | Freq: Once | ORAL | Status: AC
  Administered 2020-10-23: 2 via ORAL
  Filled 2020-10-23: qty 2

## 2020-10-23 MED ORDER — FLUTICASONE FUROATE-VILANTEROL 200-25 MCG/INH IN AEPB
1.0000 | INHALATION_SPRAY | Freq: Every day | RESPIRATORY_TRACT | Status: DC
  Administered 2020-10-24 – 2020-10-25 (×2): 1 via RESPIRATORY_TRACT
  Filled 2020-10-23: qty 28

## 2020-10-23 MED ORDER — DULOXETINE HCL 30 MG PO CPEP
60.0000 mg | ORAL_CAPSULE | Freq: Two times a day (BID) | ORAL | Status: DC
  Administered 2020-10-23 – 2020-10-25 (×3): 60 mg via ORAL
  Filled 2020-10-23 (×3): qty 2

## 2020-10-23 MED ORDER — SODIUM CHLORIDE 0.9 % IV SOLN
25.0000 mg | Freq: Four times a day (QID) | INTRAVENOUS | Status: DC | PRN
  Filled 2020-10-23: qty 1

## 2020-10-23 MED ORDER — LAMOTRIGINE 100 MG PO TABS
300.0000 mg | ORAL_TABLET | Freq: Every day | ORAL | Status: DC
  Administered 2020-10-23 – 2020-10-25 (×2): 300 mg via ORAL
  Filled 2020-10-23 (×2): qty 3

## 2020-10-23 MED ORDER — LEVOCETIRIZINE DIHYDROCHLORIDE 5 MG PO TABS: 5.0000 mg | ORAL_TABLET | Freq: Every evening | ORAL | Status: DC

## 2020-10-23 MED ORDER — SODIUM CHLORIDE 0.9% FLUSH: 3.0000 mL | INTRAVENOUS | Status: DC | PRN

## 2020-10-23 MED ORDER — POTASSIUM CHLORIDE CRYS ER 10 MEQ PO TBCR: 60.0000 meq | EXTENDED_RELEASE_TABLET | Freq: Once | ORAL | Status: DC

## 2020-10-23 MED ORDER — ALBUTEROL SULFATE (2.5 MG/3ML) 0.083% IN NEBU: 2.5000 mg | INHALATION_SOLUTION | RESPIRATORY_TRACT | Status: DC | PRN

## 2020-10-23 NOTE — ED Provider Notes (Signed)
[place] DEPT Provider Note   CSN: [id number] Arrival date & time: 10/23/20  2482     History Chief Complaint  Patient presents with   Rectal Bleeding    Veronica Rivers is a 55 y.o. female.  Veronica Rivers had endoscopy and colonoscopy 2 days ago.  She had multiple polyps removed, and shortly after the colonoscopy, she began to have rectal bleeding.  She was directed to the emergency department where she was evaluated by the provider in triage.  However, she left before being seen.  She has continued to have nausea and vomiting which were symptoms that led to her endoscopic evaluation.  Her rectal bleeding has subsided, and her last episode of rectal bleeding was at 9 PM last night.  Today when she went to clinic, she was sent to the ED for evaluation due to hyperglycemia and anemia.  She was also diagnosed with candidal esophagitis during her endoscopy.  The history is provided by the patient.  Emesis Severity:  Severe Duration:  2 days Timing:  Intermittent Number of daily episodes:  12 Quality:  Stomach contents Able to tolerate:  Liquids Progression:  Unchanged Chronicity:  Recurrent Recent urination:  Decreased Relieved by:  Nothing Worsened by:  Nothing Ineffective treatments:  Antiemetics Associated symptoms: no abdominal pain, no arthralgias, no chills, no cough, no fever and no sore throat   Risk factors: diabetes       Past Medical History:  Diagnosis Date   Asthma    Bipolar depression ([place])    Cellulitis    CHEST PAIN    DM    DYSPNEA    Emphysema of lung ([place])    HYPERCHOLESTEROLEMIA    HYPERLIPIDEMIA    HYPOTHYROIDISM    Neuropathy    SUPRAVENTRICULAR TACHYCARDIA     Patient Active Problem List   Diagnosis Date Noted   Asthma 09/15/2020   Chronic back pain 09/15/2020   Daytime somnolence 09/15/2020   Depressed bipolar I disorder ([place]) 09/15/2020   Dyssomnia 09/15/2020   Esophageal dysphagia 09/15/2020   Fatigue  09/15/2020   Fatty liver 09/15/2020   Hyperglycemia due to type 2 diabetes mellitus ([place]) 09/15/2020   Medication management 09/15/2020   Mixed hyperlipidemia 09/15/2020   Moderate persistent asthma with (acute) exacerbation 09/15/2020   Non-toxic goiter 09/15/2020   Obesity 09/15/2020   Osteoarthritis of knee 09/15/2020   Allergic rhinitis 09/15/2020   Ultrasound scan abnormal 09/15/2020   Unspecified thoracic, thoracolumbar and lumbosacral intervertebral disc disorder 09/15/2020   Vitamin D deficiency 09/15/2020   Asthma, well controlled, mild persistent 04/16/2019   Acute sinusitis 12/14/2017   Centrilobular emphysema ([place]) 11/30/2017   Cellulitis and abscess of leg 08/14/2014   Diabetic neuropathy ([place]) 08/14/2014   Bipolar depression ([place]) 08/14/2014   Hypothyroidism 04/20/2009   Type 2 diabetes mellitus ([place]) 04/20/2009   Elevated lipids 04/20/2009   Essential hypertension 04/20/2009   SUPRAVENTRICULAR TACHYCARDIA 04/20/2009    Past Surgical History:  Procedure Laterality Date   CESAREAN SECTION     FOOT SURGERY     NASAL SEPTUM SURGERY     NECK SURGERY     TONSILLECTOMY     TUBAL LIGATION       OB History   No obstetric history on file.     Family History  Problem Relation Age of Onset   Hypertension Mother    COPD Mother        Died age 28   Heart disease Mother  had ICD, coronary vasospasm   Heart attack Mother        first MI at age 53   Cancer Father        Lung, died in his 38s    Social History   Tobacco Use   Smoking status: Former    Years: 15.00    Pack years: 0.00    Types: Cigarettes    Quit date: 11/23/1997    Years since quitting: 22.9   Smokeless tobacco: Never  Vaping Use   Vaping Use: Never used  Substance Use Topics   Alcohol use: No   Drug use: No    Home Medications Prior to Admission medications   Medication Sig Start Date End Date Taking? Authorizing Provider  albuterol (VENTOLIN HFA) 108 (90 Base) MCG/ACT  inhaler Inhale 1-2 puffs into the lungs every 4 (four) hours as needed for wheezing or shortness of breath. 06/24/20   Lauraine Rinne, NP  ARIPiprazole (ABILIFY) 15 MG tablet Take 15 mg by mouth daily.    [provider]  azithromycin (ZITHROMAX Z-PAK) 250 MG tablet Take 2 tabs today, then 1 tab daily until gone 09/04/20   Rigoberto Noel, MD  carvedilol (COREG) 12.5 MG tablet TAKE 1 TABLET TWICE A DAY WITH MEALS. 02/06/20   Josue Hector, MD  Cholecalciferol (VITAMIN D3) 125 MCG (5000 UT) CAPS 1 capsule 02/07/20   [provider]  clonazePAM (KLONOPIN) 1 MG tablet Take 0.5-1 mg by mouth See admin instructions. Pt takes one half every morning PRN anxiety and one at bedtime every day 10/24/17   [provider]  CYMBALTA 30 MG capsule Take 30 mg by mouth 2 (two) times daily. 09/14/19   [provider]  diclofenac Sodium (VOLTAREN) 1 % GEL SMARTSIG:4 Gram(s) Topical 4 Times Daily PRN 06/14/19   [provider]  fluticasone (FLONASE) 50 MCG/ACT nasal spray Place 1 spray into both nostrils daily.  06/13/16   [provider]  fluticasone furoate-vilanterol (BREO ELLIPTA) 200-25 MCG/INH AEPB Inhale 1 puff into the lungs daily. 07/17/20   Rigoberto Noel, MD  hydrocortisone cream 0.5 % Apply 1 application topically 2 (two) times daily.    [provider]  lamoTRIgine (LAMICTAL) 200 MG tablet Take 300 mg by mouth daily.    [provider]  levocetirizine (XYZAL) 5 MG tablet TAKE 1 TABLET(5 MG) BY MOUTH DAILY 07/17/20   Rigoberto Noel, MD  levothyroxine (SYNTHROID) 125 MCG tablet Take 137 mcg by mouth at bedtime.     [provider]  metFORMIN (GLUCOPHAGE-XR) 500 MG 24 hr tablet Take 500 mg by mouth 2 (two) times daily. 08/19/20   [provider]  methylphenidate (RITALIN) 20 MG tablet Take 20 mg by mouth 2 (two) times daily.    [provider]  montelukast (SINGULAIR) 10 MG tablet TAKE 1 TABLET(10 MG) BY MOUTH AT BEDTIME  07/17/20   Rigoberto Noel, MD  nitroGLYCERIN (NITROSTAT) 0.4 MG SL tablet Place 1 tablet (0.4 mg total) under the tongue every 5 (five) minutes as needed for chest pain. 06/30/20 09/28/20  Josue Hector, MD  ondansetron (ZOFRAN) 4 MG tablet Take 4 mg by mouth 3 (three) times daily as needed. 08/29/20   [provider]  ONE TOUCH ULTRA TEST test strip Inject 1 strip into the skin daily. 10/11/17   [provider]  oxyCODONE-acetaminophen (PERCOCET) 10-325 MG tablet Take by mouth. 09/07/20   [provider]  OZEMPIC, 0.25 OR 0.5 MG/DOSE, 2  MG/1.5ML SOPN  10/08/19   [provider]  predniSONE (DELTASONE) 10 MG tablet 2 tabs daily X5 days, then 1 tab daily X5 days, then stop. 09/04/20   Rigoberto Noel, MD  PROMETHEGAN 12.5 MG suppository Place 12.5 mg rectally 2 (two) times daily as needed. 05/07/19   [provider]  sucralfate (CARAFATE) 1 g tablet Take 1 tablet (1 g total) by mouth 4 (four) times daily as needed. 12/06/18   Maudie Flakes, MD  SYNVISC 16 MG/2ML SOSY  07/30/19   [provider]  tiZANidine (ZANAFLEX) 4 MG tablet Take 4 mg by mouth as needed. 10/22/18   [provider]    Allergies    Omeprazole, Canagliflozin, Cyclobenzaprine, Dulaglutide, Empagliflozin, Meloxicam, Valtrex [valacyclovir], Zocor [simvastatin], Sulfa antibiotics, and Sulfa drugs cross reactors  Review of Systems   Review of Systems  Constitutional:  Negative for chills and fever.  HENT:  Negative for ear pain and sore throat.   Eyes:  Negative for pain and visual disturbance.  Respiratory:  Negative for cough and shortness of breath.   Cardiovascular:  Negative for chest pain and palpitations.  Gastrointestinal:  Positive for blood in stool, nausea and vomiting. Negative for abdominal pain.  Genitourinary:  Negative for dysuria and hematuria.  Musculoskeletal:  Negative for arthralgias and back pain.  Skin:  Negative for color change and rash.  Neurological:   Negative for seizures and syncope.  All other systems reviewed and are negative.  Physical Exam Updated Vital Signs BP (!) 179/93 (BP Location: Left Arm)   Pulse 82   Temp 97.8 F (36.6 C) (Oral)   Resp 18   SpO2 98%   Physical Exam Vitals and nursing note reviewed.  Constitutional:      General: She is not in acute distress.    Appearance: She is well-developed.  HENT:     Head: Normocephalic and atraumatic.  Eyes:     Conjunctiva/sclera: Conjunctivae normal.  Cardiovascular:     Rate and Rhythm: Normal rate and regular rhythm.     Heart sounds: No murmur heard. Pulmonary:     Effort: Pulmonary effort is normal. No respiratory distress.     Breath sounds: Normal breath sounds.  Abdominal:     Palpations: Abdomen is soft.     Tenderness: There is no abdominal tenderness.  Musculoskeletal:     Cervical back: Neck supple.  Skin:    General: Skin is warm and dry.     Capillary Refill: Capillary refill takes less than 2 seconds.  Neurological:     General: No focal deficit present.     Mental Status: She is alert and oriented to person, place, and time.  Psychiatric:        Mood and Affect: Mood normal.        Behavior: Behavior normal.    ED Results / Procedures / Treatments   Labs (all labs ordered are listed, but only abnormal results are displayed) Labs Reviewed  COMPREHENSIVE METABOLIC PANEL - Abnormal; Notable for the following components:      Result Value   Sodium 132 (*)    Potassium 3.3 (*)    Chloride 96 (*)    Glucose, Bld 267 (*)    Calcium 8.2 (*)    Total Protein 5.6 (*)    Albumin 3.4 (*)    AST 14 (*)    All other components within normal limits  CBC WITH DIFFERENTIAL/PLATELET - Abnormal; Notable for the following components:   WBC  11.6 (*)    RBC 2.37 (*)    Hemoglobin 7.7 (*)    HCT 22.3 (*)    Neutro Abs 8.3 (*)    Abs Immature Granulocytes 0.08 (*)    All other components within normal limits  SARS CORONAVIRUS 2 (TAT 6-24 HRS)   MAGNESIUM  HEMOGLOBIN AND HEMATOCRIT, BLOOD  TYPE AND SCREEN    EKG None  Radiology No results found.  Procedures Procedures   Medications Ordered in ED Medications  peg 3350 powder (MOVIPREP) kit 200 g (has no administration in time range)  ondansetron (ZOFRAN) injection 4 mg (has no administration in time range)  promethazine (PHENERGAN) 12.5 mg in sodium chloride 0.9 % 50 mL IVPB (has no administration in time range)  promethazine (PHENERGAN) 25 mg in sodium chloride 0.9 % 50 mL IVPB (has no administration in time range)  sodium chloride 0.9 % bolus 1,000 mL (0 mLs Intravenous Stopped 10/23/20 1128)  oxyCODONE-acetaminophen (PERCOCET/ROXICET) 5-325 MG per tablet 2 tablet (2 tablets Oral Given 10/23/20 1052)    ED Course  I have reviewed the triage vital signs and the nursing notes.  Pertinent labs & imaging results that were available during my care of the patient were reviewed by me and considered in my medical decision making (see chart for details).  Clinical Course as of 10/23/20 1218  Fri Oct 23, 2020  1214 Dr. Therisa Doyne saw the patient and would like her admitted for colonoscopy tomorrow.  I spoke to [facility] regarding admission. [AW]    Clinical Course User Index [AW] Arnaldo Natal, MD   MDM Rules/Calculators/A&P                          Veronica Rivers presents with nausea, vomiting, and rectal bleeding following a colonoscopy.  She said her last episode of rectal bleeding was last night at 9 PM.  She was given IV hydration here as well as antiemetics.  Abdominal exam was soft, nontender, and not suggestive of serious intra-abdominal pathology such as intestinal perforation.  Hemoglobin was 7.7, but she was not transfused because she has no history of heart disease.  She was seen by GI who request admission. Final Clinical Impression(s) / ED Diagnoses Final diagnoses:  Rectal bleeding  Anemia, unspecified type  Nausea and vomiting, intractability of vomiting not  specified, unspecified vomiting type  Hyperglycemia due to diabetes mellitus [facility])    Rx / DC Orders ED Discharge Orders     None        Arnaldo Natal, MD 10/23/20 1220

## 2020-10-23 NOTE — ED Triage Notes (Signed)
Pt arrived via walk in, c/o rectal bleeding, vomiting and abd pain after coloscopy. States she had labs drawn and hemoglobin was low (10.8) .

## 2020-10-23 NOTE — Consult Note (Signed)
Referring Provider: ED Primary Care Physician:  Kristen Loader, FNP Primary Gastroenterologist:  Dr. Alessandra Bevels [facility])  Reason for Consultation:  Post-polypectomy bleeding  HPI: Veronica Rivers is a 55 y.o. female with past medical history of DM type 2, SVT, hypothyroidism presenting for consultation of post-polypectomy bleeding.  She underwent colonoscopy on 10/21/20 with removal >10 polyps and has been having persistent bleeding since that time. She reports she has been passing dark red blood approximately every hour, though last BM was last night.  No melena. She denies passing any actual stool, only blood.  She has also had nausea and non-bloody, non-bilious vomiting for the past couple of days.  EGD 10/21/20 was pertinent only for candida esophagitis.  Denies ASA, NSAID, blood thinner use.  No prior episodes of GI bleeding.  Denies alcohol use.  Does smoke marijuana regularly.  Family history pertinent for sister with Crohn's disease. No family history of colon cancer or gastrointestinal malignancy.  Colonoscopy 10/21/20: -Four 3-36m pedunculated and sessile polyps were found in the ascending colon, removed with a hot snare.  -A 10 mm polyp was found in the hepatic flexure. The polyp was semi-sessile, removed with a hot snare.  -Three 5-8 mm sessile and semi-pedunculated polyps were found in the transverse colon,  removed with a hot snare.  -Two pedunculated 6-15 mm polyps were found in the sigmoid colon, removed with a hot snare -Two 7-945msessile polyps were found in the recto-sigmoid colon, removed with a hot snare. -Many hyperplastic polyps were found in the recto-sigmoid colon. The polyps were small in size. Biopsies were taken with a cold forceps for histology. -Multiple small and large-mouthed diverticula were found in the sigmoid colon. -Internal hemorrhoids were found during retroflexion. The hemorrhoids were medium-sized.   Past Medical History:  Diagnosis Date  .  Asthma   . [place]  . Cellulitis   . CHEST PAIN   . DM   . DYSPNEA   . Em[place]sema of lung ([place]  . HYPERCHOLESTEROLEMIA   . HYPERLIPIDEMIA   . HYPOTHYROIDISM   . Neuropathy   . SUPRAVENTRICULAR TACHYCARDIA     Past Surgical History:  Procedure Laterality Date  . CESAREAN SECTION    . FOOT SURGERY    . NASAL SEPTUM SURGERY    . NECK SURGERY    . TONSILLECTOMY    . TUBAL LIGATION      Prior to Admission medications   Medication Sig Start Date End Date Taking? Authorizing Provider  albuterol (VENTOLIN HFA) 108 (90 Base) MCG/ACT inhaler Inhale 1-2 puffs into the lungs every 4 (four) hours as needed for wheezing or shortness of breath. 06/24/20  Yes MaLauraine RinneNP  amoxicillin-clavulanate (AUGMENTIN) 875-125 MG tablet Take 1 tablet by mouth 2 (two) times daily. 10/16/20  Yes [provider]  ARIPiprazole (ABILIFY) 15 MG tablet Take 15 mg by mouth daily.   Yes [provider]  carvedilol (COREG) 12.5 MG tablet TAKE 1 TABLET TWICE A DAY WITH MEALS. Patient taking differently: Take 12.5 mg by mouth 2 (two) times daily with a meal. 02/06/20  Yes NiJosue HectorMD  Cholecalciferol (VITAMIN D3) 125 MCG (5000 UT) CAPS Take 5,000 Units by mouth daily. 02/07/20  Yes [provider]  diclofenac Sodium (VOLTAREN) 1 % GEL Apply 2 g topically 4 (four) times daily as needed (knee pain). 06/14/19  Yes [provider]  doxycycline (VIBRAMYCIN) 100 MG capsule Take 100 mg by mouth 2 (two) times daily.  10/16/20  Yes [provider]  DULoxetine (CYMBALTA) 60 MG capsule Take 60 mg by mouth 2 (two) times daily. 09/14/19  Yes [provider]  ferrous sulfate 325 (65 FE) MG tablet Take 325 mg by mouth daily with breakfast.   Yes [provider]  fluconazole (DIFLUCAN) 200 MG tablet Take 200 mg by mouth daily. 10/21/20  Yes [provider]  fluticasone (FLONASE) 50 MCG/ACT nasal spray Place 1 spray into both nostrils daily as  needed for allergies. 06/13/16  Yes [provider]  fluticasone furoate-vilanterol (BREO ELLIPTA) 200-25 MCG/INH AEPB Inhale 1 puff into the lungs daily. 07/17/20  Yes Rigoberto Noel, MD  hydrocortisone cream 0.5 % Apply 1 application topically 2 (two) times daily.   Yes [provider]  lamoTRIgine (LAMICTAL) 200 MG tablet Take 300 mg by mouth daily.   Yes [provider]  levocetirizine (XYZAL) 5 MG tablet TAKE 1 TABLET(5 MG) BY MOUTH DAILY Patient taking differently: Take 5 mg by mouth every evening. 07/17/20  Yes Rigoberto Noel, MD  levothyroxine (SYNTHROID) 125 MCG tablet Take 125 mcg by mouth at bedtime.   Yes [provider]  metFORMIN (GLUCOPHAGE-XR) 500 MG 24 hr tablet Take 500 mg by mouth 2 (two) times daily. 08/19/20  Yes [provider]  montelukast (SINGULAIR) 10 MG tablet TAKE 1 TABLET(10 MG) BY MOUTH AT BEDTIME Patient taking differently: Take 10 mg by mouth at bedtime. 07/17/20  Yes Rigoberto Noel, MD  nitroGLYCERIN (NITROSTAT) 0.4 MG SL tablet Place 1 tablet (0.4 mg total) under the tongue every 5 (five) minutes as needed for chest pain. 06/30/20 10/23/20 Yes Josue Hector, MD  oxyCODONE-acetaminophen (PERCOCET) 10-325 MG tablet Take 1 tablet by mouth 5 (five) times daily. As needed for pain 09/07/20  Yes [provider]  OZEMPIC, 1 MG/DOSE, 4 MG/3ML SOPN Inject 1 mg into the skin once a week. Monday 09/24/20  Yes [provider]  PROMETHEGAN 12.5 MG suppository Place 12.5 mg rectally 2 (two) times daily as needed for nausea or vomiting. 05/07/19  Yes [provider]  SYNVISC 16 MG/2ML SOSY  07/30/19  Yes [provider]  tiZANidine (ZANAFLEX) 4 MG tablet Take 4 mg by mouth every 6 (six) hours as needed for muscle spasms. 10/22/18  Yes [provider]  ONE TOUCH ULTRA TEST test strip Inject 1 strip into the skin daily. 10/11/17   [provider]  predniSONE (DELTASONE) 10 MG tablet 2 tabs daily X5  days, then 1 tab daily X5 days, then stop. Patient not taking: No sig reported 09/04/20   Rigoberto Noel, MD  sucralfate (CARAFATE) 1 g tablet Take 1 tablet (1 g total) by mouth 4 (four) times daily as needed. Patient not taking: No sig reported 12/06/18   Maudie Flakes, MD    Scheduled Meds: . ondansetron [facility]) IV  4 mg Intravenous Q4H  . peg 3350 powder  1 kit Oral Once   Continuous Infusions: . promethazine (PHENERGAN) injection (IM or IVPB)    . promethazine (PHENERGAN) injection (IM or IVPB) Stopped (10/23/20 1045)   PRN Meds:.promethazine (PHENERGAN) injection (IM or IVPB)  Allergies as of 10/23/2020 - Review Complete 10/23/2020  Allergen Reaction Noted  . Omeprazole  04/16/2019  . Canagliflozin  08/07/2020  . Cyclobenzaprine  08/07/2020  . Dulaglutide  08/07/2020  . Empagliflozin  08/07/2020  . Meloxicam Other (See Comments) 08/16/2016  . Valtrex [valacyclovir]  08/07/2020  . Zocor [simvastatin] Other (See Comments) 12/23/2013  .  Sulfa antibiotics Rash 10/07/2020  . Sulfa drugs cross reactors Rash 09/16/2011    Family History  Problem Relation Age of Onset  . Hypertension Mother   . COPD Mother        Died age 5  . Heart disease Mother        had ICD, coronary vasospasm  . Heart attack Mother        first MI at age 15  . Cancer Father        Lung, died in his 29s    Social History   Socioeconomic History  . Marital status: Married    Spouse name: [place]  . Number of children: 1  . Years of education: 43  . Highest education level: Not on file  Occupational History  . Occupation: Unemployed  Tobacco Use  . Smoking status: Former    Years: 15.00    Pack years: 0.00    Types: Cigarettes    Quit date: 11/23/1997    Years since quitting: 22.9  . Smokeless tobacco: Never  Vaping Use  . Vaping Use: Never used  Substance and Sexual Activity  . Alcohol use: No  . Drug use: No  . Sexual activity: Yes    Birth control/protection: Surgical  Other  Topics Concern  . Not on file  Social History Narrative   Married.  Lives with husband.  Independent of ADLs and with ambulation.   Social Determinants of Health   Financial Resource Strain: Not on file  Food Insecurity: Not on file  Transportation Needs: Not on file  Physical Activity: Not on file  Stress: Not on file  Social Connections: Not on file  Intimate Partner Violence: Not on file    Review of Systems: Review of Systems  Constitutional:  Negative for chills and fever.  HENT:  Negative for hearing loss and tinnitus.   Eyes:  Negative for pain and redness.  Respiratory:  Negative for cough and shortness of breath.   Cardiovascular:  Negative for chest pain and palpitations.  Gastrointestinal:  Positive for blood in stool, nausea and vomiting. Negative for abdominal pain, constipation, diarrhea, heartburn and melena.  Genitourinary:  Negative for flank pain and hematuria.  Musculoskeletal:  Negative for falls and joint pain.  Skin:  Negative for itching and rash.  Neurological:  Negative for seizures and loss of consciousness.  Endo/Heme/Allergies:  Negative for polydipsia. Does not bruise/bleed easily.  Psychiatric/Behavioral:  Negative for memory loss. The patient is not nervous/anxious.    Physical Exam: Vital signs: Vitals:   10/23/20 1134 10/23/20 1135  BP: (!) 172/98   Pulse: 96 96  Resp: 17   Temp:    SpO2: 97% 100%     Physical Exam Vitals reviewed.  Constitutional:      General: She is not in acute distress. HENT:     Head: Normocephalic and atraumatic.     Nose: Nose normal. No congestion.     Mouth/Throat:     Mouth: Mucous membranes are moist.     Pharynx: Oropharynx is clear.  Eyes:     Extraocular Movements: Extraocular movements intact.     Comments: Conjunctival pallor  Cardiovascular:     Rate and Rhythm: Normal rate and regular rhythm.     Pulses: Normal pulses.  Pulmonary:     Effort: Pulmonary effort is normal. No respiratory  distress.     Breath sounds: Normal breath sounds.  Abdominal:     General: Bowel sounds are normal. There is no  distension.     Palpations: Abdomen is soft. There is no mass.     Tenderness: There is no abdominal tenderness. There is no guarding or rebound.     Hernia: No hernia is present.  Musculoskeletal:        General: No swelling or tenderness.     Cervical back: Normal range of motion and neck supple.  Skin:    General: Skin is warm and dry.  Neurological:     General: No focal deficit present.     Mental Status: She is alert and oriented to person, place, and time.  Psychiatric:        Mood and Affect: Mood normal.        Behavior: Behavior normal. Behavior is cooperative.     GI:  Lab Results: Recent Labs    10/23/20 1001  WBC 11.6*  HGB 7.7*  HCT 22.3*  PLT 266   BMET Recent Labs    10/23/20 1001  NA 132*  K 3.3*  CL 96*  CO2 27  GLUCOSE 267*  BUN 6  CREATININE 0.68  CALCIUM 8.2*   LFT Recent Labs    10/23/20 1001  PROT 5.6*  ALBUMIN 3.4*  AST 14*  ALT 19  ALKPHOS 49  BILITOT 0.5   PT/INR No results for input(s): LABPROT, INR in the last 72 hours.   Studies/Results: No results found.  Impression: Post polypectomy bleeding: >10 polyps removed with hot snare on 10/21/20. -Hgb 7.7, decreased from 10.3 yesterday -Normal BUN/Cr  Nausea/vomiting, possibly cannabinoid hyperemesis syndrome. EGD 10/21/20 only pertinent for candida esophagitis.  DM type 2, glucose 267  Plan: Colonoscopy tomorrow.  We thoroughly discussed the procedure with the patient to include nature, alternatives, benefits, and risks, and patient gave verbal consent to proceed with colonoscopy.  Clear liquid diet with moviprep today (patient not able to tolerate GoLytely prep given nausea/vomiting).  N.p.o. after midnight.  Scheduled antiemetics (Zofran every 4 hours and Phenergan every 6 hours).  Continue to monitor H&H with transfusion as needed to maintain hemoglobin  greater than 7.  [facility] will follow.   LOS: 0 days   Salley Slaughter  PA-C 10/23/2020, 11:43 AM  Contact #  [phone]

## 2020-10-23 NOTE — H&P (Signed)
History and Physical    CATHE BILGER GUY:[id number] DOB: 10/25/1965 DOA: 10/23/2020  PCP: Kristen Loader, FNP  Patient coming from: home   Chief Complaint: rectal bleeding  HPI: Veronica Rivers is a 55 y.o. female with medical history significant for t2dm, hypothyroid, bipolar, asthma, who presents with the above.  Followed by [facility]. Seen there recently for chronic nausea and vomiting. 2 days ago had egd and colonoscopy. Results not available for my review. Patient reports "yeast infection" on EGD and fluconazole started. She reports she had "fifteen" polyps that that most if not all were removed. Denies rectal bleeding prior to colonoscopy. She reports that after colonoscopy developed that same day red rectal bleeding, initially small amount, then as much as a cup or so of blood. She feels a bit weak. She reports chronic upper abdominal pain, otherwise no new abd pain. No fevers. Her nausea persists. She presented to an outpatient provider today, labs there showed anemia so she was referred to the ED (she presented to the ED on 6/15 but left before being seen).  ED Course:   Labs show hgb of 7.7. GI consulted with plan for admission, colonoscopy tomorrow.  Review of Systems: As per HPI otherwise 10 point review of systems negative.    Past Medical History:  Diagnosis Date   Asthma    Bipolar depression ([place])    Cellulitis    CHEST PAIN    DM    DYSPNEA    Emphysema of lung ([place])    HYPERCHOLESTEROLEMIA    HYPERLIPIDEMIA    HYPOTHYROIDISM    Neuropathy    SUPRAVENTRICULAR TACHYCARDIA     Past Surgical History:  Procedure Laterality Date   CESAREAN SECTION     FOOT SURGERY     NASAL SEPTUM SURGERY     NECK SURGERY     TONSILLECTOMY     TUBAL LIGATION       reports that she quit smoking about 22 years ago. Her smoking use included cigarettes. She has never used smokeless tobacco. She reports that she does not drink alcohol and does not use drugs.  Allergies   Allergen Reactions   Omeprazole     Made her breathing worse.   Canagliflozin     Other reaction(s): yeast infections   Cyclobenzaprine     Other reaction(s): crazy in head   Dulaglutide     Other reaction(s): nausea and vomiting   Empagliflozin     Other reaction(s): yeast infections   Meloxicam Other (See Comments)    Pt stated, "Got Burning, Acid Reflux with medicine" Pt should not be on any anti-inflammatories d/t elevated LFTs   Valtrex [Valacyclovir]     Other reaction(s): agitation   Zocor [Simvastatin] Other (See Comments)    Caused muscle pain   Sulfa Antibiotics Rash   Sulfa Drugs Cross Reactors Rash    Family History  Problem Relation Age of Onset   Hypertension Mother    COPD Mother        Died age 67   Heart disease Mother        had ICD, coronary vasospasm   Heart attack Mother        first MI at age 62   Cancer Father        Lung, died in his 69s    Prior to Admission medications   Medication Sig Start Date End Date Taking? Authorizing Provider  albuterol (VENTOLIN HFA) 108 (90 Base) MCG/ACT inhaler Inhale 1-2 puffs  into the lungs every 4 (four) hours as needed for wheezing or shortness of breath. 06/24/20  Yes Lauraine Rinne, NP  ARIPiprazole (ABILIFY) 15 MG tablet Take 15 mg by mouth daily.   Yes [provider]  carvedilol (COREG) 12.5 MG tablet TAKE 1 TABLET TWICE A DAY WITH MEALS. Patient taking differently: Take 12.5 mg by mouth 2 (two) times daily with a meal. 02/06/20  Yes Josue Hector, MD  Cholecalciferol (VITAMIN D3) 125 MCG (5000 UT) CAPS Take 5,000 Units by mouth daily. 02/07/20  Yes [provider]  diclofenac Sodium (VOLTAREN) 1 % GEL Apply 2 g topically 4 (four) times daily as needed (knee pain). 06/14/19  Yes [provider]  DULoxetine (CYMBALTA) 60 MG capsule Take 60 mg by mouth 2 (two) times daily. 09/14/19  Yes [provider]  ferrous sulfate 325 (65 FE) MG tablet Take 325 mg by mouth daily with  breakfast.   Yes [provider]  fluconazole (DIFLUCAN) 200 MG tablet Take 200 mg by mouth daily. 10/21/20  Yes [provider]  fluticasone (FLONASE) 50 MCG/ACT nasal spray Place 1 spray into both nostrils daily as needed for allergies. 06/13/16  Yes [provider]  fluticasone furoate-vilanterol (BREO ELLIPTA) 200-25 MCG/INH AEPB Inhale 1 puff into the lungs daily. 07/17/20  Yes Rigoberto Noel, MD  hydrocortisone cream 0.5 % Apply 1 application topically 2 (two) times daily.   Yes [provider]  lamoTRIgine (LAMICTAL) 200 MG tablet Take 300 mg by mouth daily.   Yes [provider]  levocetirizine (XYZAL) 5 MG tablet TAKE 1 TABLET(5 MG) BY MOUTH DAILY Patient taking differently: Take 5 mg by mouth every evening. 07/17/20  Yes Rigoberto Noel, MD  levothyroxine (SYNTHROID) 125 MCG tablet Take 125 mcg by mouth at bedtime.   Yes [provider]  metFORMIN (GLUCOPHAGE-XR) 500 MG 24 hr tablet Take 500 mg by mouth 2 (two) times daily. 08/19/20  Yes [provider]  montelukast (SINGULAIR) 10 MG tablet TAKE 1 TABLET(10 MG) BY MOUTH AT BEDTIME Patient taking differently: Take 10 mg by mouth at bedtime. 07/17/20  Yes Rigoberto Noel, MD  nitroGLYCERIN (NITROSTAT) 0.4 MG SL tablet Place 1 tablet (0.4 mg total) under the tongue every 5 (five) minutes as needed for chest pain. 06/30/20 10/23/20 Yes Josue Hector, MD  oxyCODONE-acetaminophen (PERCOCET) 10-325 MG tablet Take 1 tablet by mouth 5 (five) times daily. As needed for pain 09/07/20  Yes [provider]  OZEMPIC, 1 MG/DOSE, 4 MG/3ML SOPN Inject 1 mg into the skin once a week. Monday 09/24/20  Yes [provider]  PROMETHEGAN 12.5 MG suppository Place 12.5 mg rectally 2 (two) times daily as needed for nausea or vomiting. 05/07/19  Yes [provider]  SYNVISC 16 MG/2ML SOSY  07/30/19  Yes [provider]  tiZANidine (ZANAFLEX) 4 MG tablet Take 4 mg by mouth every 6  (six) hours as needed for muscle spasms. 10/22/18  Yes [provider]  ONE TOUCH ULTRA TEST test strip Inject 1 strip into the skin daily. 10/11/17   [provider]  sucralfate (CARAFATE) 1 g tablet Take 1 tablet (1 g total) by mouth 4 (four) times daily as needed. Patient not taking: No sig reported 12/06/18 10/23/20  Maudie Flakes, MD    Physical Exam: Vitals:   10/23/20 1030 10/23/20 1100 10/23/20 1134 10/23/20 1135  BP: (!) 183/84  (!) 172/98   Pulse: 91 91 96 96  Resp: 18  17   Temp:      TempSrc:      SpO2: 98% 97% 97% 100%    Constitutional: No acute distress Head: Atraumatic Eyes: Conjunctiva clear ENM: Moist mucous membranes. Normal dentition.  Neck: Supple Respiratory: Clear to auscultation bilaterally, no wheezing/rales/rhonchi. Normal respiratory effort. No accessory muscle use. . Cardiovascular: Regular rate and rhythm. Soft systolic murmur. Abdomen: Non-tender, non-distended, obese. No masses. No rebound or guarding. Positive bowel sounds. Musculoskeletal: No joint deformity upper and lower extremities. Normal ROM, no contractures. Normal muscle tone.  Skin: No rashes, lesions, or ulcers.  Extremities: No peripheral edema. Palpable peripheral pulses. Neurologic: Alert, moving all 4 extremities. Psychiatric: Normal insight and judgement.   Labs on Admission: I have personally reviewed following labs and imaging studies  CBC: Recent Labs  Lab 10/23/20 1001  WBC 11.6*  NEUTROABS 8.3*  HGB 7.7*  HCT 22.3*  MCV 94.1  PLT 347   Basic Metabolic Panel: Recent Labs  Lab 10/23/20 1001  NA 132*  K 3.3*  CL 96*  CO2 27  GLUCOSE 267*  BUN 6  CREATININE 0.68  CALCIUM 8.2*   GFR: Estimated Creatinine Clearance: 105.7 mL/min (by C-G formula based on SCr of 0.68 mg/dL). Liver Function Tests: Recent Labs  Lab 10/23/20 1001  AST 14*  ALT 19  ALKPHOS 49  BILITOT 0.5  PROT 5.6*  ALBUMIN 3.4*   No results for input(s): LIPASE, AMYLASE in  the last 168 hours. No results for input(s): AMMONIA in the last 168 hours. Coagulation Profile: No results for input(s): INR, PROTIME in the last 168 hours. Cardiac Enzymes: No results for input(s): CKTOTAL, CKMB, CKMBINDEX, TROPONINI in the last 168 hours. BNP (last 3 results) No results for input(s): PROBNP in the last 8760 hours. HbA1C: No results for input(s): HGBA1C in the last 72 hours. CBG: No results for input(s): GLUCAP in the last 168 hours. Lipid Profile: No results for input(s): CHOL, HDL, LDLCALC, TRIG, CHOLHDL, LDLDIRECT in the last 72 hours. Thyroid Function Tests: No results for input(s): TSH, T4TOTAL, FREET4, T3FREE, THYROIDAB in the last 72 hours. Anemia Panel: No results for input(s): VITAMINB12, FOLATE, FERRITIN, TIBC, IRON, RETICCTPCT in the last 72 hours. Urine analysis: No results found for: COLORURINE, APPEARANCEUR, LABSPEC, PHURINE, GLUCOSEU, HGBUR, BILIRUBINUR, KETONESUR, PROTEINUR, UROBILINOGEN, NITRITE, LEUKOCYTESUR  Radiological Exams on Admission: No results found.    Assessment/Plan Active Problems:   Lower GI bleeding   # Acute lower GI bleeding # Acute blood loss anemia Bleeding likely 2/2 recent polypectomy. Abd is soft and patient is hemodynamically stable, doubt bowel perforation. H 7.7, reportedly in the 10s recently. - type and screen - trend hemoglobin, transfuse if drops below 7 - f/u GI final recs, initial plan for bowel prep tonight and colonoscopy tomorrow  # Chronic nausea and vomiting # Esophageal candidiasis - mgmt per GI - continue fluconazole  # T2DM Here glucose in 200s - hold home ozempic, metformin - start SSI, holding on long-acting insulin given reduced po  # Hypokalemia 3.3, likely 2/2 recent nausea/vomiting - kcl 60 oral ordered - f/u Mg   # Asthma Controlled - cont home breo, antihistamine, singulair  # Hx SVT - cont home coret  # HTN Pt reports normotension at home, here systolics 425Z - cont home  coreg - hydral prn  # Bipolar disorder Affect appropriate - cont home duloxetine, abilify, lamictal  # Hypothyroid - cont home synthroid  # Chronic pain - cont home oxycodone  DVT prophylaxis: SCDs Code Status: full  Family Communication: husband updated @ bedside 6/17  Consults called: GI   Level of care: Med-Surg Status is: Inpatient  Remains inpatient appropriate because:Inpatient level of care appropriate due to severity of illness  Dispo: The patient is from: Home              Anticipated d/c is to: Home              Patient currently is not medically stable to d/c.   Difficult to place patient No        Desma Maxim MD [facility] Pager [phone]  If 7PM-7AM, please contact night-coverage [email or web address] Password [facility]  10/23/2020, 12:29 PM

## 2020-10-24 ENCOUNTER — Encounter (HOSPITAL_COMMUNITY): Payer: Self-pay | Admitting: Obstetrics and Gynecology

## 2020-10-24 ENCOUNTER — Encounter (HOSPITAL_COMMUNITY)
Admission: EM | Disposition: A | Discharge status: Home / Self Care | Payer: Self-pay | Source: Home / Self Care | Attending: Internal Medicine

## 2020-10-24 ENCOUNTER — Inpatient Hospital Stay (HOSPITAL_COMMUNITY): Payer: PRIVATE HEALTH INSURANCE | Admitting: Anesthesiology

## 2020-10-24 DIAGNOSIS — K922 Gastrointestinal hemorrhage, unspecified: Secondary | ICD-10-CM | POA: Diagnosis not present

## 2020-10-24 HISTORY — PX: COLONOSCOPY: SHX5424

## 2020-10-24 HISTORY — PX: HEMOSTASIS CLIP PLACEMENT: SHX6857

## 2020-10-24 LAB — CBC
HCT: 18.1 % — ABNORMAL LOW (ref 36.0–46.0)
Hemoglobin: 6.2 g/dL — CL (ref 12.0–15.0)
MCH: 33 pg (ref 26.0–34.0)
MCHC: 34.3 g/dL (ref 30.0–36.0)
MCV: 96.3 fL (ref 80.0–100.0)
Platelets: 238 10*3/uL (ref 150–400)
RBC: 1.88 MIL/uL — ABNORMAL LOW (ref 3.87–5.11)
RDW: 13.4 % (ref 11.5–15.5)
WBC: 9.3 10*3/uL (ref 4.0–10.5)
nRBC: 0.2 % (ref 0.0–0.2)

## 2020-10-24 LAB — COMPREHENSIVE METABOLIC PANEL
ALT: 15 U/L (ref 0–44)
AST: 11 U/L — ABNORMAL LOW (ref 15–41)
Albumin: 3 g/dL — ABNORMAL LOW (ref 3.5–5.0)
Alkaline Phosphatase: 40 U/L (ref 38–126)
Anion gap: 6 (ref 5–15)
BUN: <5 mg/dL — ABNORMAL LOW (ref 6–20)
CO2: 30 mmol/L (ref 22–32)
Calcium: 8.1 mg/dL — ABNORMAL LOW (ref 8.9–10.3)
Chloride: 102 mmol/L (ref 98–111)
Creatinine, Ser: 0.57 mg/dL (ref 0.44–1.00)
GFR, Estimated: >60 mL/min (ref >60)
Glucose, Bld: 176 mg/dL — ABNORMAL HIGH (ref 70–99)
Potassium: 2.8 mmol/L — ABNORMAL LOW (ref 3.5–5.1)
Sodium: 138 mmol/L (ref 135–145)
Total Bilirubin: 0.2 mg/dL — ABNORMAL LOW (ref 0.3–1.2)
Total Protein: 5 g/dL — ABNORMAL LOW (ref 6.5–8.1)

## 2020-10-24 LAB — GLUCOSE, CAPILLARY
Glucose-Capillary: 198 mg/dL — ABNORMAL HIGH (ref 70–99)
Glucose-Capillary: 159 mg/dL — ABNORMAL HIGH (ref 70–99)
Glucose-Capillary: 194 mg/dL — ABNORMAL HIGH (ref 70–99)
Glucose-Capillary: 148 mg/dL — ABNORMAL HIGH (ref 70–99)

## 2020-10-24 LAB — PREPARE RBC (CROSSMATCH)

## 2020-10-24 LAB — HEMOGLOBIN AND HEMATOCRIT, BLOOD
HCT: 23.9 % — ABNORMAL LOW (ref 36.0–46.0)
Hemoglobin: 8.1 g/dL — ABNORMAL LOW (ref 12.0–15.0)

## 2020-10-24 LAB — HIV ANTIBODY (ROUTINE TESTING W REFLEX): HIV Screen 4th Generation wRfx: NONREACTIVE

## 2020-10-24 LAB — POTASSIUM: Potassium: 3.3 mmol/L — ABNORMAL LOW (ref 3.5–5.1)

## 2020-10-24 SURGERY — COLONOSCOPY
Anesthesia: Monitor Anesthesia Care

## 2020-10-24 MED ORDER — PROPOFOL 10 MG/ML IV BOLUS
INTRAVENOUS | Status: DC | PRN
  Administered 2020-10-24 (×2): 20 mg via INTRAVENOUS
  Administered 2020-10-24: 80 mg via INTRAVENOUS
  Administered 2020-10-24: 20 mg via INTRAVENOUS

## 2020-10-24 MED ORDER — POTASSIUM CHLORIDE CRYS ER 10 MEQ PO TBCR
40.0000 meq | EXTENDED_RELEASE_TABLET | Freq: Once | ORAL | Status: AC
  Administered 2020-10-24: 40 meq via ORAL
  Filled 2020-10-24: qty 4

## 2020-10-24 MED ORDER — SODIUM CHLORIDE 0.9% IV SOLUTION: Freq: Once | INTRAVENOUS | Status: AC

## 2020-10-24 MED ORDER — SODIUM CHLORIDE 0.9% IV SOLUTION: Freq: Once | INTRAVENOUS | Status: DC

## 2020-10-24 MED ORDER — POTASSIUM CHLORIDE 10 MEQ/100ML IV SOLN
10.0000 meq | INTRAVENOUS | Status: AC
  Administered 2020-10-24 (×6): 10 meq via INTRAVENOUS
  Filled 2020-10-24 (×6): qty 100

## 2020-10-24 MED ORDER — LACTATED RINGERS IV SOLN: INTRAVENOUS | Status: DC

## 2020-10-24 MED ORDER — PROPOFOL 500 MG/50ML IV EMUL
INTRAVENOUS | Status: DC | PRN
  Administered 2020-10-24: 100 ug/kg/min via INTRAVENOUS

## 2020-10-24 MED ORDER — LIDOCAINE 2% (20 MG/ML) 5 ML SYRINGE
INTRAMUSCULAR | Status: DC | PRN
  Administered 2020-10-24: 100 mg via INTRAVENOUS

## 2020-10-24 NOTE — Anesthesia Postprocedure Evaluation (Signed)
Anesthesia Post Note  Patient: Veronica Rivers  Procedure(s) Performed: COLONOSCOPY HEMOSTASIS CLIP PLACEMENT     Patient location during evaluation: PACU Anesthesia Type: MAC Level of consciousness: awake and alert Pain management: pain level controlled Vital Signs Assessment: post-procedure vital signs reviewed and stable Respiratory status: spontaneous breathing, nonlabored ventilation, respiratory function stable and patient connected to nasal cannula oxygen Cardiovascular status: stable and blood pressure returned to baseline Postop Assessment: no apparent nausea or vomiting Anesthetic complications: no   No notable events documented.  Last Vitals:  Vitals:   10/24/20 1200 10/24/20 1210  BP: (!) 164/73 134/67  Pulse: 88 85  Resp: 13 12  Temp:    SpO2: 97% 96%    Last Pain:  Vitals:   10/24/20 1146  TempSrc: Oral  PainSc: 0-No pain                 Daviana Haymaker

## 2020-10-24 NOTE — Progress Notes (Signed)
PROGRESS NOTE    Veronica Rivers  LTJ:[id number] DOB: [birth year] DOA: 10/23/2020 PCP: Kristen Loader, FNP   Chief Complain: Rectal bleeding  Brief Narrative: Patient is a 55 year old female with history of type 2 diabetes mellitus, hypothyroidism, bipolar disorder, asthma who presented with complaints of rectal bleeding.  She had an endoscopy and colonoscopy procedure 2 days ago for the evaluation of chronic nausea and vomiting.  She was found to have a polyps during colonoscopy and they were removed.  After the colonoscopy, she started having rectal bleeding.  She was seen by her PCP , blood work showed anemia and she was referred to the emergency department.  She was admitted for hematochezia.  Hemoglobin was 7.7 on presentation.  Hemoglobin dropped to the range of 6 today.  Being transfused with 2 units of PRBC, plan for colonoscopy today.  Assessment & Plan:   Active Problems:   Lower GI bleeding   Hematochezia/lower GI bleed: Most likely secondary to polypectomy.  Patient denies any abdomen pain.  Plan for colonoscopy today.  Acute blood loss anemia: Hemoglobin dropped to the range of 6.  Being transfused with 2 units of PRBC.  Monitor H&H.  Chronic nausea/vomiting: Found to have esophageal candidiasis during recent EGD.  Continue fluconazole  Type 2 diabetes mellitus: On Ozempic, metformin at home.  Continue sliding scale insulin here.  Monitor blood sugars  Hypokalemia: Likely secondary to recent nausea/vomiting.  Being supplemented with potassium.  Asthma: Currently not in exacerbation.  On Breo, antihistamines, Singulair at home.  History of SVT: Continue home Coreg  Hypertension: Continue current medications.  Currently blood pressure stable  Bipolar disorder: On duloxetine, Abilify, Lamictal  Hypothyroidism: Continue Synthyroid  Chronic pain syndrome: On oxycodone  Morbid obesity: BMI of 35.4           DVT prophylaxis:SCD Code Status: Full Family  Communication: Family member at bedside Status is: Inpatient  Remains inpatient appropriate because:Unsafe d/c plan  Dispo: The patient is from: Home              Anticipated d/c is to: Home              Patient currently is not medically stable to d/c.   Difficult to place patient No     Consultants: GI   Procedures:None  Antimicrobials:  Anti-infectives (From admission, onward)    Start     Dose/Rate Route Frequency Ordered Stop   10/23/20 1700  fluconazole (DIFLUCAN) tablet 200 mg        200 mg Oral Daily 10/23/20 1241         Subjective:  Patient seen and examined the bedside this morning.  She is still having bloody bowel movement.  Denies any abdominal pain.  Waiting for colonoscopy.   Objective: Vitals:   10/23/20 1430 10/23/20 1828 10/23/20 2044 10/24/20 0359  BP: (!) 175/91 (!) 165/99 119/74 124/72  Pulse: 83 (!) 106 (!) 110   Resp: 18 20 20 18   Temp: 98.3 F (36.8 C)  99.2 F (37.3 C) 98.5 F (36.9 C)  TempSrc: Oral  Oral Oral  SpO2: 96% 97% 91%     Intake/Output Summary (Last 24 hours) at 10/24/2020 0809 Last data filed at 10/24/2020 0350 Gross per 24 hour  Intake 1149 ml  Output --  Net 1149 ml   There were no vitals filed for this visit.  Examination:  General exam: Overall comfortable, not in distress,obese HEENT: PERRL Respiratory system:  no wheezes or crackles  Cardiovascular  system: S1 & S2 heard, RRR.  Gastrointestinal system: Abdomen is nondistended, soft and nontender. Central nervous system: Alert and oriented Extremities: No edema, no clubbing ,no cyanosis Skin: No rashes, no ulcers,no icterus    Data Reviewed: I have personally reviewed following labs and imaging studies  CBC: Recent Labs  Lab 10/23/20 1001 10/23/20 2040 10/24/20 0621  WBC 11.6*  --  9.3  NEUTROABS 8.3*  --   --   HGB 7.7* 7.2* 6.2*  HCT 22.3* 21.1* 18.1*  MCV 94.1  --  96.3  PLT 266  --  938   Basic Metabolic Panel: Recent Labs  Lab  10/23/20 1001 10/24/20 0621  NA 132* 138  K 3.3* 2.8*  CL 96* 102  CO2 27 30  GLUCOSE 267* 176*  BUN 6 <5*  CREATININE 0.68 0.57  CALCIUM 8.2* 8.1*  MG 1.8  --    GFR: Estimated Creatinine Clearance: 105.7 mL/min (by C-G formula based on SCr of 0.57 mg/dL). Liver Function Tests: Recent Labs  Lab 10/23/20 1001 10/24/20 0621  AST 14* 11*  ALT 19 15  ALKPHOS 49 40  BILITOT 0.5 0.2*  PROT 5.6* 5.0*  ALBUMIN 3.4* 3.0*   No results for input(s): LIPASE, AMYLASE in the last 168 hours. No results for input(s): AMMONIA in the last 168 hours. Coagulation Profile: No results for input(s): INR, PROTIME in the last 168 hours. Cardiac Enzymes: No results for input(s): CKTOTAL, CKMB, CKMBINDEX, TROPONINI in the last 168 hours. BNP (last 3 results) No results for input(s): PROBNP in the last 8760 hours. HbA1C: No results for input(s): HGBA1C in the last 72 hours. CBG: Recent Labs  Lab 10/23/20 1312 10/23/20 1630 10/23/20 2120 10/24/20 0715  GLUCAP 221* 219* 217* 198*   Lipid Profile: No results for input(s): CHOL, HDL, LDLCALC, TRIG, CHOLHDL, LDLDIRECT in the last 72 hours. Thyroid Function Tests: No results for input(s): TSH, T4TOTAL, FREET4, T3FREE, THYROIDAB in the last 72 hours. Anemia Panel: No results for input(s): VITAMINB12, FOLATE, FERRITIN, TIBC, IRON, RETICCTPCT in the last 72 hours. Sepsis Labs: No results for input(s): PROCALCITON, LATICACIDVEN in the last 168 hours.  Recent Results (from the past 240 hour(s))  Resp Panel by RT-PCR (Flu A&B, Covid) Nasopharyngeal Swab     Status: None   Collection Time: 10/23/20 12:41 PM   Specimen: Nasopharyngeal Swab; Nasopharyngeal(NP) swabs in vial transport medium  Result Value Ref Range Status   SARS Coronavirus 2 by RT PCR NEGATIVE NEGATIVE Final    Comment: (NOTE) SARS-CoV-2 target nucleic acids are NOT DETECTED.  The SARS-CoV-2 RNA is generally detectable in upper respiratory specimens during the acute phase of  infection. The lowest concentration of SARS-CoV-2 viral copies this assay can detect is 138 copies/mL. A negative result does not preclude SARS-Cov-2 infection and should not be used as the sole basis for treatment or other patient management decisions. A negative result may occur with  improper specimen collection/handling, submission of specimen other than nasopharyngeal swab, presence of viral mutation(s) within the areas targeted by this assay, and inadequate number of viral copies(<138 copies/mL). A negative result must be combined with clinical observations, patient history, and epidemiological information. The expected result is Negative.  Fact Sheet for Patients:  EntrepreneurPulse.com.au  Fact Sheet for Healthcare Providers:  IncredibleEmployment.be  This test is no t yet approved or cleared by the [place] FDA and  has been authorized for detection and/or diagnosis of SARS-CoV-2 by FDA under an Emergency Use Authorization (EUA). This EUA will remain  in effect (meaning this test can be used) for the duration of the COVID-19 declaration under Section 564(b)(1) of the Act, 21 U.S.C.section 360bbb-3(b)(1), unless the authorization is terminated  or revoked sooner.       Influenza A by PCR NEGATIVE NEGATIVE Final   Influenza B by PCR NEGATIVE NEGATIVE Final    Comment: (NOTE) The Xpert Xpress SARS-CoV-2/FLU/RSV plus assay is intended as an aid in the diagnosis of influenza from Nasopharyngeal swab specimens and should not be used as a sole basis for treatment. Nasal washings and aspirates are unacceptable for Xpert Xpress SARS-CoV-2/FLU/RSV testing.  Fact Sheet for Patients: EntrepreneurPulse.com.au  Fact Sheet for Healthcare Providers: IncredibleEmployment.be  This test is not yet approved or cleared by the [place] FDA and has been authorized for detection and/or diagnosis of SARS-CoV-2  by FDA under an Emergency Use Authorization (EUA). This EUA will remain in effect (meaning this test can be used) for the duration of the COVID-19 declaration under Section 564(b)(1) of the Act, 21 U.S.C. section 360bbb-3(b)(1), unless the authorization is terminated or revoked.  Performed at [facility], [place] [address], [place], [place] [address]          Radiology Studies: No results found.      Scheduled Meds:  sodium chloride   Intravenous Once   sodium chloride   Intravenous Once   ARIPiprazole  15 mg Oral Daily   carvedilol  12.5 mg Oral BID WC   DULoxetine  60 mg Oral BID   fluconazole  200 mg Oral Daily   fluticasone furoate-vilanterol  1 puff Inhalation Daily   insulin aspart  0-15 Units Subcutaneous TID WC   lamoTRIgine  300 mg Oral Daily   levothyroxine  125 mcg Oral Q0600   loratadine  10 mg Oral Daily   montelukast  10 mg Oral QHS   ondansetron (ZOFRAN) IV  4 mg Intravenous Q4H   sodium chloride flush  3 mL Intravenous Q12H   Continuous Infusions:  sodium chloride     potassium chloride     promethazine (PHENERGAN) injection (IM or IVPB) 12.5 mg (10/24/20 0404)   promethazine (PHENERGAN) injection (IM or IVPB)       LOS: 1 day    Time spent: 25 mins.More than 50% of that time was spent in counseling and/or coordination of care.      Shelly Coss, MD [facility] P6/18/2022, 8:09 AM

## 2020-10-24 NOTE — Anesthesia Preprocedure Evaluation (Signed)
Anesthesia Evaluation  Patient identified by MRN, date of birth, ID band Patient awake    Reviewed: Allergy & Precautions, H&P , NPO status , Patient's Chart, lab work & pertinent test results, reviewed documented beta blocker date and time   Airway Mallampati: II  TM Distance: >3 FB Neck ROM: full    Dental no notable dental hx.    Pulmonary asthma , COPD,  COPD inhaler, former smoker,    Pulmonary exam normal breath sounds clear to auscultation       Cardiovascular Exercise Tolerance: Good hypertension, Pt. on medications + dysrhythmias Supra Ventricular Tachycardia  Rhythm:regular Rate:Normal     Neuro/Psych PSYCHIATRIC DISORDERS Bipolar Disorder negative neurological ROS     GI/Hepatic negative GI ROS, Neg liver ROS,   Endo/Other  diabetes, Type 2Hypothyroidism Morbid obesity  Renal/GU negative Renal ROS  negative genitourinary   Musculoskeletal  (+) Arthritis , Osteoarthritis,    Abdominal   Peds  Hematology  (+) Blood dyscrasia, anemia ,   Anesthesia Other Findings   Reproductive/Obstetrics negative OB ROS                             Anesthesia Physical Anesthesia Plan  ASA: 3  Anesthesia Plan: MAC   Post-op Pain Management:    Induction:   PONV Risk Score and Plan: 2  Airway Management Planned: Simple Face Mask, Natural Airway and Nasal Cannula  Additional Equipment: None  Intra-op Plan:   Post-operative Plan:   Informed Consent: I have reviewed the patients History and Physical, chart, labs and discussed the procedure including the risks, benefits and alternatives for the proposed anesthesia with the patient or authorized representative who has indicated his/her understanding and acceptance.     Dental Advisory Given  Plan Discussed with: CRNA and Anesthesiologist  Anesthesia Plan Comments:         Anesthesia Quick Evaluation

## 2020-10-24 NOTE — Op Note (Signed)
[facility] Patient Name: Veronica Rivers Procedure Date: 10/24/2020 MRN: [id number] Attending MD: Ronnette Juniper , MD Date of Birth: 1965/11/23 CSN: [id number] Age: 55 Admit Type: Inpatient Procedure:                Colonoscopy Indications:              Hematochezia, multiple colon polyps were removed on                            10/21/20 Providers:                Ronnette Juniper, MD, Elmer Ramp. Tilden Dome, RN, Benetta Spar, Technician Referring MD:             [facility] Medicines:                Monitored Anesthesia Care Complications:            No immediate complications. Estimated Blood Loss:     Estimated blood loss: none. Procedure:                Pre-Anesthesia Assessment:                           - Prior to the procedure, a History and Physical                            was performed, and patient medications and                            allergies were reviewed. The patient's tolerance of                            previous anesthesia was also reviewed. The risks                            and benefits of the procedure and the sedation                            options and risks were discussed with the patient.                            All questions were answered, and informed consent                            was obtained. Prior Anticoagulants: The patient has                            taken no previous anticoagulant or antiplatelet                            agents. ASA Grade Assessment: III - A patient with                            severe systemic  disease. After reviewing the risks                            and benefits, the patient was deemed in                            satisfactory condition to undergo the procedure.                           After obtaining informed consent, the colonoscope                            was passed under direct vision. Throughout the                            procedure, the patient's blood  pressure, pulse, and                            oxygen saturations were monitored continuously. The                            CF-HQ190L ([id number]) Olympus colonoscope was                            introduced through the anus and advanced to the the                            cecum, identified by appendiceal orifice and                            ileocecal valve. The colonoscopy was performed                            without difficulty. The patient tolerated the                            procedure well. The quality of the bowel                            preparation was poor. Scope In: 10:54:17 AM Scope Out: 11:41:31 AM Scope Withdrawal Time: 0 hours 29 minutes 6 seconds  Total Procedure Duration: 0 hours 47 minutes 14 seconds  Findings:      The perianal and digital rectal examinations were normal.      Red blood was found in the entire colon.      A patchy area of severely ulcerated mucosa was found in the transverse       colon(#3) and at the hepatic flexure(#1).      The polypectomy site at hepatic flexure had a visible vessle.      To prevent bleeding after the polypectomy, four hemostatic clips were       successfully placed (MR conditional), 2 in hepatic flexure(same site)       and 2 different areas in transverse colon. There was no bleeding during,       or at the end, of the procedure.  A few hyperplastic polyps were found in the rectum and recto-sigmoid       colon. The polyps were small in size. Polypectomy was not attempted.      The retroflexed view of the distal rectum and anal verge was normal and       showed no anal or rectal abnormalities. Impression:               - Preparation of the colon was poor.                           - Blood in the entire examined colon.                           - Ulcerated mucosa in the transverse colon and at                            the hepatic flexure(visible vessel). Clips (MR                            conditional) were  placed.                           - A few small polyps in the rectum and at the                            recto-sigmoid colon. Resection not attempted.                           - The distal rectum and anal verge are normal on                            retroflexion view.                           - No specimens collected. Moderate Sedation:      Patient did not receive moderate sedation for this procedure, but       instead received monitored anesthesia care. Recommendation:           - Clear liquid diet.                           - H and H every 12 hours and transfuse to keep Hb                            above 7. Procedure Code(s):        --- Professional ---                           [phone], Colonoscopy, flexible; diagnostic, including                            collection of specimen(s) by brushing or washing,                            when performed (separate procedure) Diagnosis Code(s):        ---  Professional ---                           K92.2, Gastrointestinal hemorrhage, unspecified                           K63.3, Ulcer of intestine                           K62.1, Rectal polyp                           K63.5, Polyp of colon                           K92.1, Melena (includes Hematochezia) CPT copyright 2019 American Medical Association. All rights reserved. The codes documented in this report are preliminary and upon coder review may  be revised to meet current compliance requirements. Ronnette Juniper, MD 10/24/2020 11:50:56 AM This report has been signed electronically. Number of Addenda: 0

## 2020-10-24 NOTE — Transfer of Care (Signed)
Immediate Anesthesia Transfer of Care Note  Patient: Veronica Rivers  Procedure(s) Performed: COLONOSCOPY HEMOSTASIS CLIP PLACEMENT  Patient Location: PACU  Anesthesia Type:MAC  Level of Consciousness: awake, alert , oriented and patient cooperative  Airway & Oxygen Therapy: Patient Spontanous Breathing and Patient connected to face mask oxygen  Post-op Assessment: Report given to RN and Post -op Vital signs reviewed and stable  Post vital signs: Reviewed and stable  Last Vitals:  Vitals Value Taken Time  BP 164/79 10/24/20 1146  Temp    Pulse 88 10/24/20 1146  Resp 10 10/24/20 1146  SpO2 98 % 10/24/20 1146    Last Pain:  Vitals:   10/24/20 1146  TempSrc: Oral  PainSc: 0-No pain      Patients Stated Pain Goal: 0 (70/01/74 9449)  Complications: No notable events documented.

## 2020-10-25 DIAGNOSIS — K922 Gastrointestinal hemorrhage, unspecified: Secondary | ICD-10-CM | POA: Diagnosis not present

## 2020-10-25 LAB — BASIC METABOLIC PANEL
Anion gap: 3 — ABNORMAL LOW (ref 5–15)
BUN: <5 mg/dL — ABNORMAL LOW (ref 6–20)
CO2: 32 mmol/L (ref 22–32)
Calcium: 8.1 mg/dL — ABNORMAL LOW (ref 8.9–10.3)
Chloride: 102 mmol/L (ref 98–111)
Creatinine, Ser: 0.74 mg/dL (ref 0.44–1.00)
GFR, Estimated: >60 mL/min (ref >60)
Glucose, Bld: 140 mg/dL — ABNORMAL HIGH (ref 70–99)
Potassium: 3.6 mmol/L (ref 3.5–5.1)
Sodium: 137 mmol/L (ref 135–145)

## 2020-10-25 LAB — CBC WITH DIFFERENTIAL/PLATELET
Abs Immature Granulocytes: 0.05 10*3/uL (ref 0.00–0.07)
Basophils Absolute: 0 10*3/uL (ref 0.0–0.1)
Basophils Relative: 1 %
Eosinophils Absolute: 0.1 10*3/uL (ref 0.0–0.5)
Eosinophils Relative: 1 %
HCT: 23.3 % — ABNORMAL LOW (ref 36.0–46.0)
Hemoglobin: 7.5 g/dL — ABNORMAL LOW (ref 12.0–15.0)
Immature Granulocytes: 1 %
Lymphocytes Relative: 44 %
Lymphs Abs: 3.1 10*3/uL (ref 0.7–4.0)
MCH: 31.5 pg (ref 26.0–34.0)
MCHC: 32.2 g/dL (ref 30.0–36.0)
MCV: 97.9 fL (ref 80.0–100.0)
Monocytes Absolute: 0.6 10*3/uL (ref 0.1–1.0)
Monocytes Relative: 8 %
Neutro Abs: 3.1 10*3/uL (ref 1.7–7.7)
Neutrophils Relative %: 45 %
Platelets: 202 10*3/uL (ref 150–400)
RBC: 2.38 MIL/uL — ABNORMAL LOW (ref 3.87–5.11)
RDW: 15.4 % (ref 11.5–15.5)
WBC: 7 10*3/uL (ref 4.0–10.5)
nRBC: 0.9 % — ABNORMAL HIGH (ref 0.0–0.2)

## 2020-10-25 LAB — GLUCOSE, CAPILLARY: Glucose-Capillary: 149 mg/dL — ABNORMAL HIGH (ref 70–99)

## 2020-10-25 MED ORDER — FERROUS SULFATE 325 (65 FE) MG PO TABS: 325.0000 mg | ORAL_TABLET | Freq: Every day | ORAL | Status: DC

## 2020-10-25 MED ORDER — LACTATED RINGERS IV BOLUS
500.0000 mL | Freq: Once | INTRAVENOUS | Status: AC
  Administered 2020-10-25: 500 mL via INTRAVENOUS

## 2020-10-25 NOTE — Progress Notes (Signed)
Pt leaving with her spouse. Alert and oriented. Pt aware of importance and labs needed for followup appointment. Discharge instructions given/explained with pt verbalizing understanding.

## 2020-10-25 NOTE — Discharge Summary (Signed)
Physician Discharge Summary  BRUNETTA NEWINGHAM WGN:[id number] DOB: [birth year] DOA: 10/23/2020  PCP: Kristen Loader, FNP  Admit date: 10/23/2020 Discharge date: 10/25/2020  Admitted From: Home Disposition:  Home  Discharge Condition:Stable CODE STATUS:FULL Diet recommendation: Soft diet for next 1-2 days   Brief/Interim Summary:  Patient is a 55 year old female with history of type 2 diabetes mellitus, hypothyroidism, bipolar disorder, asthma who presented with complaints of rectal bleeding.  She had an endoscopy and colonoscopy procedure 2 days ago for the evaluation of chronic nausea and vomiting.  She was found to have a polyps during colonoscopy and they were removed.  After the colonoscopy, she started having rectal bleeding.  She was seen by her PCP , blood work showed anemia and she was referred to the emergency department.  She was admitted for hematochezia.  Hemoglobin was 7.7 on presentation.  Hemoglobin dropped to the range of 6 and she was transfused with 2 units of PRBC, underwent colonoscopy with finding of ulcerated mucosa in transverse colon, hepatic flexure with visible vessels, status post clip application.  No further bleeding.  Hemoglobin stable in the range of 7 today.  She is medically stable for discharge to home today.  Following problems were addressed during her hospitalization:  Hematochezia/lower GI bleed: Secondary to polypectomy. She was admitted for hematochezia.  Hemoglobin was 7.7 on presentation.  Hemoglobin dropped to the range of 6 and she was transfused with 2 units of PRBC, underwent colonoscopy with finding of ulcerated mucosa in transverse colon, hepatic flexure with visible vessels, status post clip application.  No further bleeding.  Hemoglobin stable in the range of 7 today.    Acute blood loss anemia: Hemoglobin dropped to the range of 6.  2 units of PRBC were transfused, hemoglobin stable in the range of 7.  Continue iron supplementation at home.     Chronic nausea/vomiting: Found to have esophageal candidiasis during recent EGD.  On fluconazole   Type 2 diabetes mellitus: On Ozempic, metformin at home   Hypokalemia: Likely secondary to recent nausea/vomiting.  Supplemented with potassium.   Asthma: Currently not in exacerbation.  On Breo, antihistamines, Singulair at home.   History of SVT: Continue home Coreg   Hypertension: Continue current medications.  Patient advised to monitor his blood pressure at home.   Bipolar disorder: On duloxetine, Abilify, Lamictal   Hypothyroidism: Continue Synthyroid   Chronic pain syndrome: On oxycodone at home   Discharge Diagnoses:  Active Problems:   Lower GI bleeding    Discharge Instructions  Discharge Instructions     Diet - low sodium heart healthy   Complete by: As directed    Discharge instructions   Complete by: As directed    1)Please take prescribed medications as instructed 2)Follow up with your PCP in a week.  Do a CBC test to check your hemoglobin during the follow-up 3)Monitor your blood pressure at home 4)Take soft diet for next 1-2 days   Increase activity slowly   Complete by: As directed       Allergies as of 10/25/2020       Reactions   Omeprazole    Made her breathing worse.   Canagliflozin    Other reaction(s): yeast infections   Cyclobenzaprine    Other reaction(s): crazy in head   Dulaglutide    Other reaction(s): nausea and vomiting   Empagliflozin    Other reaction(s): yeast infections   Meloxicam Other (See Comments)   Pt stated, "Got Burning, Acid Reflux with medicine"  Pt should not be on any anti-inflammatories d/t elevated LFTs   Valtrex [valacyclovir]    Other reaction(s): agitation   Zocor [simvastatin] Other (See Comments)   Caused muscle pain   Sulfa Antibiotics Rash   Sulfa Drugs Cross Reactors Rash        Medication List     TAKE these medications    albuterol 108 (90 Base) MCG/ACT inhaler Commonly known as: VENTOLIN  HFA Inhale 1-2 puffs into the lungs every 4 (four) hours as needed for wheezing or shortness of breath.   ARIPiprazole 15 MG tablet Commonly known as: ABILIFY Take 15 mg by mouth daily.   Breo Ellipta 200-25 MCG/INH Aepb Generic drug: fluticasone furoate-vilanterol Inhale 1 puff into the lungs daily.   diclofenac Sodium 1 % Gel Commonly known as: VOLTAREN Apply 2 g topically 4 (four) times daily as needed (knee pain).   DULoxetine 60 MG capsule Commonly known as: CYMBALTA Take 60 mg by mouth 2 (two) times daily.   ferrous sulfate 325 (65 FE) MG tablet Take 325 mg by mouth daily with breakfast.   fluconazole 200 MG tablet Commonly known as: DIFLUCAN Take 200 mg by mouth daily.   fluticasone 50 MCG/ACT nasal spray Commonly known as: FLONASE Place 1 spray into both nostrils daily as needed for allergies.   hydrocortisone cream 0.5 % Apply 1 application topically 2 (two) times daily.   lamoTRIgine 200 MG tablet Commonly known as: LAMICTAL Take 300 mg by mouth daily.   levothyroxine 125 MCG tablet Commonly known as: SYNTHROID Take 125 mcg by mouth at bedtime.   metFORMIN 500 MG 24 hr tablet Commonly known as: GLUCOPHAGE-XR Take 500 mg by mouth 2 (two) times daily.   nitroGLYCERIN 0.4 MG SL tablet Commonly known as: NITROSTAT Place 1 tablet (0.4 mg total) under the tongue every 5 (five) minutes as needed for chest pain.   ONE TOUCH ULTRA TEST test strip Generic drug: glucose blood Inject 1 strip into the skin daily.   oxyCODONE-acetaminophen 10-325 MG tablet Commonly known as: PERCOCET Take 1 tablet by mouth 5 (five) times daily. As needed for pain   Ozempic (1 MG/DOSE) 4 MG/3ML Sopn Generic drug: Semaglutide (1 MG/DOSE) Inject 1 mg into the skin once a week. Monday   Promethegan 12.5 MG suppository Generic drug: promethazine Place 12.5 mg rectally 2 (two) times daily as needed for nausea or vomiting.   Synvisc 16 MG/2ML Sosy Generic drug: Hylan    tiZANidine 4 MG tablet Commonly known as: ZANAFLEX Take 4 mg by mouth every 6 (six) hours as needed for muscle spasms.   Vitamin D3 125 MCG (5000 UT) Caps Take 5,000 Units by mouth daily.       ASK your doctor about these medications    carvedilol 12.5 MG tablet Commonly known as: COREG TAKE 1 TABLET TWICE A DAY WITH MEALS.   levocetirizine 5 MG tablet Commonly known as: XYZAL TAKE 1 TABLET(5 MG) BY MOUTH DAILY   montelukast 10 MG tablet Commonly known as: SINGULAIR TAKE 1 TABLET(10 MG) BY MOUTH AT BEDTIME        Follow-up Information     Kristen Loader, FNP. Schedule an appointment as soon as possible for a visit in 1 week(s).   Specialty: Family Medicine Contact information: [place] [address] [phone]                Allergies  Allergen Reactions   Omeprazole     Made her breathing worse.  Canagliflozin     Other reaction(s): yeast infections   Cyclobenzaprine     Other reaction(s): crazy in head   Dulaglutide     Other reaction(s): nausea and vomiting   Empagliflozin     Other reaction(s): yeast infections   Meloxicam Other (See Comments)    Pt stated, "Got Burning, Acid Reflux with medicine" Pt should not be on any anti-inflammatories d/t elevated LFTs   Valtrex [Valacyclovir]     Other reaction(s): agitation   Zocor [Simvastatin] Other (See Comments)    Caused muscle pain   Sulfa Antibiotics Rash   Sulfa Drugs Cross Reactors Rash    Consultations: GI   Procedures/Studies: No results found.    Subjective: Patient seen and examined the bedside this morning.  Hemodynamically stable for discharge.   Discharge Exam: Vitals:   10/25/20 0716 10/25/20 0824  BP: 110/63   Pulse: 69   Resp:    Temp:    SpO2:  91%   Vitals:   10/24/20 2045 10/25/20 0509 10/25/20 0716 10/25/20 0824  BP: 121/78 (!) 97/53 110/63   Pulse: 72 71 69   Resp:  20    Temp: 98.9 F (37.2 C) 97.8 F (36.6 C)    TempSrc: Oral  Oral    SpO2:  96%  91%  Weight:      Height:        General: Pt is alert, awake, not in acute distress Cardiovascular: RRR, S1/S2 +, no rubs, no gallops Respiratory: CTA bilaterally, no wheezing, no rhonchi Abdominal: Soft, NT, ND, bowel sounds + Extremities: no edema, no cyanosis    The results of significant diagnostics from this hospitalization (including imaging, microbiology, ancillary and laboratory) are listed below for reference.     Microbiology: Recent Results (from the past 240 hour(s))  Resp Panel by RT-PCR (Flu A&B, Covid) Nasopharyngeal Swab     Status: None   Collection Time: 10/23/20 12:41 PM   Specimen: Nasopharyngeal Swab; Nasopharyngeal(NP) swabs in vial transport medium  Result Value Ref Range Status   SARS Coronavirus 2 by RT PCR NEGATIVE NEGATIVE Final    Comment: (NOTE) SARS-CoV-2 target nucleic acids are NOT DETECTED.  The SARS-CoV-2 RNA is generally detectable in upper respiratory specimens during the acute phase of infection. The lowest concentration of SARS-CoV-2 viral copies this assay can detect is 138 copies/mL. A negative result does not preclude SARS-Cov-2 infection and should not be used as the sole basis for treatment or other patient management decisions. A negative result may occur with  improper specimen collection/handling, submission of specimen other than nasopharyngeal swab, presence of viral mutation(s) within the areas targeted by this assay, and inadequate number of viral copies(<138 copies/mL). A negative result must be combined with clinical observations, patient history, and epidemiological information. The expected result is Negative.  Fact Sheet for Patients:  EntrepreneurPulse.com.au  Fact Sheet for Healthcare Providers:  IncredibleEmployment.be  This test is no t yet approved or cleared by the [place] FDA and  has been authorized for detection and/or diagnosis of SARS-CoV-2  by FDA under an Emergency Use Authorization (EUA). This EUA will remain  in effect (meaning this test can be used) for the duration of the COVID-19 declaration under Section 564(b)(1) of the Act, 21 U.S.C.section 360bbb-3(b)(1), unless the authorization is terminated  or revoked sooner.       Influenza A by PCR NEGATIVE NEGATIVE Final   Influenza B by PCR NEGATIVE NEGATIVE Final    Comment: (NOTE) The Xpert Xpress SARS-CoV-2/FLU/RSV plus  assay is intended as an aid in the diagnosis of influenza from Nasopharyngeal swab specimens and should not be used as a sole basis for treatment. Nasal washings and aspirates are unacceptable for Xpert Xpress SARS-CoV-2/FLU/RSV testing.  Fact Sheet for Patients: EntrepreneurPulse.com.au  Fact Sheet for Healthcare Providers: IncredibleEmployment.be  This test is not yet approved or cleared by the [place] FDA and has been authorized for detection and/or diagnosis of SARS-CoV-2 by FDA under an Emergency Use Authorization (EUA). This EUA will remain in effect (meaning this test can be used) for the duration of the COVID-19 declaration under Section 564(b)(1) of the Act, 21 U.S.C. section 360bbb-3(b)(1), unless the authorization is terminated or revoked.  Performed at [facility], [place] [address] [place], [place] [address]      Labs: BNP (last 3 results) No results for input(s): BNP in the last 8760 hours. Basic Metabolic Panel: Recent Labs  Lab 10/23/20 1001 10/24/20 0621 10/24/20 1916 10/25/20 0329  NA 132* 138  --  137  K 3.3* 2.8* 3.3* 3.6  CL 96* 102  --  102  CO2 27 30  --  32  GLUCOSE 267* 176*  --  140*  BUN 6 <5*  --  <5*  CREATININE 0.68 0.57  --  0.74  CALCIUM 8.2* 8.1*  --  8.1*  MG 1.8  --   --   --    Liver Function Tests: Recent Labs  Lab 10/23/20 1001 10/24/20 0621  AST 14* 11*  ALT 19 15  ALKPHOS 49 40  BILITOT 0.5 0.2*  PROT 5.6* 5.0*  ALBUMIN  3.4* 3.0*   No results for input(s): LIPASE, AMYLASE in the last 168 hours. No results for input(s): AMMONIA in the last 168 hours. CBC: Recent Labs  Lab 10/23/20 1001 10/23/20 2040 10/24/20 0621 10/24/20 1916 10/25/20 0329  WBC 11.6*  --  9.3  --  7.0  NEUTROABS 8.3*  --   --   --  3.1  HGB 7.7* 7.2* 6.2* 8.1* 7.5*  HCT 22.3* 21.1* 18.1* 23.9* 23.3*  MCV 94.1  --  96.3  --  97.9  PLT 266  --  238  --  202   Cardiac Enzymes: No results for input(s): CKTOTAL, CKMB, CKMBINDEX, TROPONINI in the last 168 hours. BNP: Invalid input(s): POCBNP CBG: Recent Labs  Lab 10/24/20 0715 10/24/20 1238 10/24/20 1729 10/24/20 2025 10/25/20 0732  GLUCAP 198* 159* 194* 148* 149*   D-Dimer No results for input(s): DDIMER in the last 72 hours. Hgb A1c No results for input(s): HGBA1C in the last 72 hours. Lipid Profile No results for input(s): CHOL, HDL, LDLCALC, TRIG, CHOLHDL, LDLDIRECT in the last 72 hours. Thyroid function studies No results for input(s): TSH, T4TOTAL, T3FREE, THYROIDAB in the last 72 hours.  Invalid input(s): FREET3 Anemia work up No results for input(s): VITAMINB12, FOLATE, FERRITIN, TIBC, IRON, RETICCTPCT in the last 72 hours. Urinalysis No results found for: COLORURINE, APPEARANCEUR, [place], [place], GLUCOSEU, [place], [place], [place], PROTEINUR, UROBILINOGEN, NITRITE, LEUKOCYTESUR Sepsis Labs Invalid input(s): PROCALCITONIN,  WBC,  LACTICIDVEN Microbiology Recent Results (from the past 240 hour(s))  Resp Panel by RT-PCR (Flu A&B, Covid) Nasopharyngeal Swab     Status: None   Collection Time: 10/23/20 12:41 PM   Specimen: Nasopharyngeal Swab; Nasopharyngeal(NP) swabs in vial transport medium  Result Value Ref Range Status   SARS Coronavirus 2 by RT PCR NEGATIVE NEGATIVE Final    Comment: (NOTE) SARS-CoV-2 target nucleic acids are NOT DETECTED.  The SARS-CoV-2  RNA is generally detectable in upper respiratory specimens during the acute phase of  infection. The lowest concentration of SARS-CoV-2 viral copies this assay can detect is 138 copies/mL. A negative result does not preclude SARS-Cov-2 infection and should not be used as the sole basis for treatment or other patient management decisions. A negative result may occur with  improper specimen collection/handling, submission of specimen other than nasopharyngeal swab, presence of viral mutation(s) within the areas targeted by this assay, and inadequate number of viral copies(<138 copies/mL). A negative result must be combined with clinical observations, patient history, and epidemiological information. The expected result is Negative.  Fact Sheet for Patients:  EntrepreneurPulse.com.au  Fact Sheet for Healthcare Providers:  IncredibleEmployment.be  This test is no t yet approved or cleared by the [place] FDA and  has been authorized for detection and/or diagnosis of SARS-CoV-2 by FDA under an Emergency Use Authorization (EUA). This EUA will remain  in effect (meaning this test can be used) for the duration of the COVID-19 declaration under Section 564(b)(1) of the Act, 21 U.S.C.section 360bbb-3(b)(1), unless the authorization is terminated  or revoked sooner.       Influenza A by PCR NEGATIVE NEGATIVE Final   Influenza B by PCR NEGATIVE NEGATIVE Final    Comment: (NOTE) The Xpert Xpress SARS-CoV-2/FLU/RSV plus assay is intended as an aid in the diagnosis of influenza from Nasopharyngeal swab specimens and should not be used as a sole basis for treatment. Nasal washings and aspirates are unacceptable for Xpert Xpress SARS-CoV-2/FLU/RSV testing.  Fact Sheet for Patients: EntrepreneurPulse.com.au  Fact Sheet for Healthcare Providers: IncredibleEmployment.be  This test is not yet approved or cleared by the [place] FDA and has been authorized for detection and/or diagnosis of SARS-CoV-2  by FDA under an Emergency Use Authorization (EUA). This EUA will remain in effect (meaning this test can be used) for the duration of the COVID-19 declaration under Section 564(b)(1) of the Act, 21 U.S.C. section 360bbb-3(b)(1), unless the authorization is terminated or revoked.  Performed at [facility], [place] [address], [place], [place] [address]     Please note: You were cared for by a hospitalist during your hospital stay. Once you are discharged, your primary care physician will handle any further medical issues. Please note that NO REFILLS for any discharge medications will be authorized once you are discharged, as it is imperative that you return to your primary care physician (or establish a relationship with a primary care physician if you do not have one) for your post hospital discharge needs so that they can reassess your need for medications and monitor your lab values.    Time coordinating discharge: 40 minutes  SIGNED:   Shelly Coss, MD  [facility] 10/25/2020, 9:37 AM Pager [phone]  If 7PM-7AM, please contact night-coverage [email or web address] Password [email or web address]

## 2020-10-25 NOTE — Progress Notes (Signed)
Subjective: Patient denies nausea vomiting and was able to tolerate clear liquid diet. She has not had any bowel movement since colonoscopy but is passing gas and denies abdominal pain.  Objective: Vital signs in last 24 hours: Temp:  [97.8 F (36.6 C)-98.9 F (37.2 C)] 97.8 F (36.6 C) (06/19 0509) Pulse Rate:  [69-90] 69 (06/19 0716) Resp:  [10-20] 20 (06/19 0509) BP: (97-174)/(46-79) 110/63 (06/19 0716) SpO2:  [87 %-99 %] 91 % (06/19 0824) Weight:  [108.9 kg] 108.9 kg (06/18 1026) Weight change:  Last BM Date: 10/24/20  PE: Mild pallor, not in distress GENERAL: Able to speak in full sentences, on room air  ABDOMEN: Soft, nondistended, normoactive bowel sounds EXTREMITIES: No deformity  Lab Results: Results for orders placed or performed during the hospital encounter of 10/23/20 (from the past 48 hour(s))  Comprehensive metabolic panel     Status: Abnormal   Collection Time: 10/23/20 10:01 AM  Result Value Ref Range   Sodium 132 (L) 135 - 145 mmol/L   Potassium 3.3 (L) 3.5 - 5.1 mmol/L   Chloride 96 (L) 98 - 111 mmol/L   CO2 27 22 - 32 mmol/L   Glucose, Bld 267 (H) 70 - 99 mg/dL    Comment: Glucose reference range applies only to samples taken after fasting for at least 8 hours.   BUN 6 6 - 20 mg/dL   Creatinine, Ser 0.68 0.44 - 1.00 mg/dL   Calcium 8.2 (L) 8.9 - 10.3 mg/dL   Total Protein 5.6 (L) 6.5 - 8.1 g/dL   Albumin 3.4 (L) 3.5 - 5.0 g/dL   AST 14 (L) 15 - 41 U/L   ALT 19 0 - 44 U/L   Alkaline Phosphatase 49 38 - 126 U/L   Total Bilirubin 0.5 0.3 - 1.2 mg/dL   GFR, Estimated >60 >60 mL/min    Comment: (NOTE) Calculated using the CKD-EPI Creatinine Equation (2021)    Anion gap 9 5 - 15    Comment: Performed at [facility], [place] [address], [place], [place] [address]  CBC with Differential     Status: Abnormal   Collection Time: 10/23/20 10:01 AM  Result Value Ref Range   WBC 11.6 (H) 4.0 - 10.5 K/uL   RBC 2.37 (L) 3.87 - 5.11 MIL/uL    Hemoglobin 7.7 (L) 12.0 - 15.0 g/dL   HCT 22.3 (L) 36.0 - 46.0 %   MCV 94.1 80.0 - 100.0 fL   MCH 32.5 26.0 - 34.0 pg   MCHC 34.5 30.0 - 36.0 g/dL   RDW 12.9 11.5 - 15.5 %   Platelets 266 150 - 400 K/uL   nRBC 0.0 0.0 - 0.2 %   Neutrophils Relative % 71 %   Neutro Abs 8.3 (H) 1.7 - 7.7 K/uL   Lymphocytes Relative 22 %   Lymphs Abs 2.5 0.7 - 4.0 K/uL   Monocytes Relative 6 %   Monocytes Absolute 0.7 0.1 - 1.0 K/uL   Eosinophils Relative 0 %   Eosinophils Absolute 0.0 0.0 - 0.5 K/uL   Basophils Relative 0 %   Basophils Absolute 0.0 0.0 - 0.1 K/uL   Immature Granulocytes 1 %   Abs Immature Granulocytes 0.08 (H) 0.00 - 0.07 K/uL    Comment: Performed at [facility], [place] [address] [place],  [address]  Magnesium     Status: None   Collection Time: 10/23/20 10:01 AM  Result Value Ref Range   Magnesium 1.8 1.7 - 2.4 mg/dL  Comment: Performed at [facility], [place] [address] [place], [place] [address]  ABO/Rh     Status: None   Collection Time: 10/23/20 10:01 AM  Result Value Ref Range   ABO/RH(D)      O POS Performed at [facility], [place] [address], [place], [place] [address]   Resp Panel by RT-PCR (Flu A&B, Covid) Nasopharyngeal Swab     Status: None   Collection Time: 10/23/20 12:41 PM   Specimen: Nasopharyngeal Swab; Nasopharyngeal(NP) swabs in vial transport medium  Result Value Ref Range   SARS Coronavirus 2 by RT PCR NEGATIVE NEGATIVE    Comment: (NOTE) SARS-CoV-2 target nucleic acids are NOT DETECTED.  The SARS-CoV-2 RNA is generally detectable in upper respiratory specimens during the acute phase of infection. The lowest concentration of SARS-CoV-2 viral copies this assay can detect is 138 copies/mL. A negative result does not preclude SARS-Cov-2 infection and should not be used as the sole basis for treatment or other patient management decisions. A negative result may occur with  improper specimen  collection/handling, submission of specimen other than nasopharyngeal swab, presence of viral mutation(s) within the areas targeted by this assay, and inadequate number of viral copies(<138 copies/mL). A negative result must be combined with clinical observations, patient history, and epidemiological information. The expected result is Negative.  Fact Sheet for Patients:  EntrepreneurPulse.com.au  Fact Sheet for Healthcare Providers:  IncredibleEmployment.be  This test is no t yet approved or cleared by the [place] FDA and  has been authorized for detection and/or diagnosis of SARS-CoV-2 by FDA under an Emergency Use Authorization (EUA). This EUA will remain  in effect (meaning this test can be used) for the duration of the COVID-19 declaration under Section 564(b)(1) of the Act, 21 U.S.C.section 360bbb-3(b)(1), unless the authorization is terminated  or revoked sooner.       Influenza A by PCR NEGATIVE NEGATIVE   Influenza B by PCR NEGATIVE NEGATIVE    Comment: (NOTE) The Xpert Xpress SARS-CoV-2/FLU/RSV plus assay is intended as an aid in the diagnosis of influenza from Nasopharyngeal swab specimens and should not be used as a sole basis for treatment. Nasal washings and aspirates are unacceptable for Xpert Xpress SARS-CoV-2/FLU/RSV testing.  Fact Sheet for Patients: EntrepreneurPulse.com.au  Fact Sheet for Healthcare Providers: IncredibleEmployment.be  This test is not yet approved or cleared by the [place] FDA and has been authorized for detection and/or diagnosis of SARS-CoV-2 by FDA under an Emergency Use Authorization (EUA). This EUA will remain in effect (meaning this test can be used) for the duration of the COVID-19 declaration under Section 564(b)(1) of the Act, 21 U.S.C. section 360bbb-3(b)(1), unless the authorization is terminated or revoked.  Performed at [facility], [place] [address], [place], [place] [address]   Type and screen [place]     Status: None (Preliminary result)   Collection Time: 10/23/20 12:48 PM  Result Value Ref Range   ABO/RH(D) O POS    Antibody Screen NEG    Sample Expiration 10/26/2020,2359    Unit Number [id number]    Blood Component Type RED CELLS,LR    Unit division 00    Status of Unit ISSUED,FINAL    Transfusion Status OK TO TRANSFUSE    Crossmatch Result Compatible    Unit Number [id number]    Blood Component Type RED CELLS,LR    Unit division 00    Status of Unit ISSUED,FINAL    Transfusion Status OK TO  TRANSFUSE    Crossmatch Result      Compatible Performed at [facility], [place] [address], [place], [place] [address]    Unit Number [id number]    Blood Component Type RBC LR PHER2    Unit division 00    Status of Unit ALLOCATED    Transfusion Status OK TO TRANSFUSE    Crossmatch Result Compatible   CBG monitoring, ED     Status: Abnormal   Collection Time: 10/23/20  1:12 PM  Result Value Ref Range   Glucose-Capillary 221 (H) 70 - 99 mg/dL    Comment: Glucose reference range applies only to samples taken after fasting for at least 8 hours.  Glucose, capillary     Status: Abnormal   Collection Time: 10/23/20  4:30 PM  Result Value Ref Range   Glucose-Capillary 219 (H) 70 - 99 mg/dL    Comment: Glucose reference range applies only to samples taken after fasting for at least 8 hours.   Comment 1 Notify RN   Hemoglobin and hematocrit, blood     Status: Abnormal   Collection Time: 10/23/20  8:40 PM  Result Value Ref Range   Hemoglobin 7.2 (L) 12.0 - 15.0 g/dL   HCT 21.1 (L) 36.0 - 46.0 %    Comment: Performed at [facility], [place] [address], [place], [place] [address]  Glucose, capillary     Status: Abnormal   Collection Time: 10/23/20  9:20 PM  Result Value Ref Range   Glucose-Capillary 217 (H) 70 - 99 mg/dL    Comment: Glucose  reference range applies only to samples taken after fasting for at least 8 hours.  HIV Antibody (routine testing w rflx)     Status: None   Collection Time: 10/24/20  6:21 AM  Result Value Ref Range   HIV Screen 4th Generation wRfx Non Reactive Non Reactive    Comment: Performed at [facility], [place] [address] [place], [place] [address]  Comprehensive metabolic panel     Status: Abnormal   Collection Time: 10/24/20  6:21 AM  Result Value Ref Range   Sodium 138 135 - 145 mmol/L   Potassium 2.8 (L) 3.5 - 5.1 mmol/L   Chloride 102 98 - 111 mmol/L   CO2 30 22 - 32 mmol/L   Glucose, Bld 176 (H) 70 - 99 mg/dL    Comment: Glucose reference range applies only to samples taken after fasting for at least 8 hours.   BUN <5 (L) 6 - 20 mg/dL   Creatinine, Ser 0.57 0.44 - 1.00 mg/dL   Calcium 8.1 (L) 8.9 - 10.3 mg/dL   Total Protein 5.0 (L) 6.5 - 8.1 g/dL   Albumin 3.0 (L) 3.5 - 5.0 g/dL   AST 11 (L) 15 - 41 U/L   ALT 15 0 - 44 U/L   Alkaline Phosphatase 40 38 - 126 U/L   Total Bilirubin 0.2 (L) 0.3 - 1.2 mg/dL   GFR, Estimated >60 >60 mL/min    Comment: (NOTE) Calculated using the CKD-EPI Creatinine Equation (2021)    Anion gap 6 5 - 15    Comment: Performed at [facility], [place] [address] [place], [place] [address]  CBC     Status: Abnormal   Collection Time: 10/24/20  6:21 AM  Result Value Ref Range   WBC 9.3 4.0 - 10.5 K/uL   RBC 1.88 (L) 3.87 - 5.11 MIL/uL   Hemoglobin 6.2 (LL) 12.0 - 15.0 g/dL  Comment: This critical result has verified and been called to Cherylann Banas RN by Marvell Fuller on 06 18 2022 at 0700, and has been read back. CRITICAL RESULT VERIFIED   HCT 18.1 (L) 36.0 - 46.0 %   MCV 96.3 80.0 - 100.0 fL   MCH 33.0 26.0 - 34.0 pg   MCHC 34.3 30.0 - 36.0 g/dL   RDW 13.4 11.5 - 15.5 %   Platelets 238 150 - 400 K/uL   nRBC 0.2 0.0 - 0.2 %    Comment: Performed at [facility], [place] [address] [place], [place] [address]   Glucose, capillary     Status: Abnormal   Collection Time: 10/24/20  7:15 AM  Result Value Ref Range   Glucose-Capillary 198 (H) 70 - 99 mg/dL    Comment: Glucose reference range applies only to samples taken after fasting for at least 8 hours.  Prepare RBC (crossmatch)     Status: None   Collection Time: 10/24/20  7:30 AM  Result Value Ref Range   Order Confirmation      ORDER PROCESSED BY BLOOD BANK Performed at [facility], [place] [address] [place], [place] [address]   Prepare RBC (crossmatch)     Status: None   Collection Time: 10/24/20  7:36 AM  Result Value Ref Range   Order Confirmation      ORDER PROCESSED BY BLOOD BANK Performed at [facility], [place] [address] [place], [place] [address]   Glucose, capillary     Status: Abnormal   Collection Time: 10/24/20 12:38 PM  Result Value Ref Range   Glucose-Capillary 159 (H) 70 - 99 mg/dL    Comment: Glucose reference range applies only to samples taken after fasting for at least 8 hours.  Glucose, capillary     Status: Abnormal   Collection Time: 10/24/20  5:29 PM  Result Value Ref Range   Glucose-Capillary 194 (H) 70 - 99 mg/dL    Comment: Glucose reference range applies only to samples taken after fasting for at least 8 hours.  Potassium     Status: Abnormal   Collection Time: 10/24/20  7:16 PM  Result Value Ref Range   Potassium 3.3 (L) 3.5 - 5.1 mmol/L    Comment: Performed at [facility], [place] [address] [place], [place] [address]  Hemoglobin and hematocrit, blood     Status: Abnormal   Collection Time: 10/24/20  7:16 PM  Result Value Ref Range   Hemoglobin 8.1 (L) 12.0 - 15.0 g/dL    Comment: POST TRANSFUSION SPECIMEN   HCT 23.9 (L) 36.0 - 46.0 %    Comment: Performed at [facility], [place] [address] [place],  [address]  Glucose, capillary     Status: Abnormal   Collection Time: 10/24/20  8:25 PM  Result Value Ref Range    Glucose-Capillary 148 (H) 70 - 99 mg/dL    Comment: Glucose reference range applies only to samples taken after fasting for at least 8 hours.  CBC with Differential/Platelet     Status: Abnormal   Collection Time: 10/25/20  3:29 AM  Result Value Ref Range   WBC 7.0 4.0 - 10.5 K/uL   RBC 2.38 (L) 3.87 - 5.11 MIL/uL   Hemoglobin 7.5 (L) 12.0 - 15.0 g/dL   HCT 23.3 (L) 36.0 - 46.0 %   MCV 97.9 80.0 - 100.0 fL   MCH 31.5 26.0 - 34.0 pg   MCHC 32.2 30.0 -  36.0 g/dL   RDW 15.4 11.5 - 15.5 %   Platelets 202 150 - 400 K/uL   nRBC 0.9 (H) 0.0 - 0.2 %   Neutrophils Relative % 45 %   Neutro Abs 3.1 1.7 - 7.7 K/uL   Lymphocytes Relative 44 %   Lymphs Abs 3.1 0.7 - 4.0 K/uL   Monocytes Relative 8 %   Monocytes Absolute 0.6 0.1 - 1.0 K/uL   Eosinophils Relative 1 %   Eosinophils Absolute 0.1 0.0 - 0.5 K/uL   Basophils Relative 1 %   Basophils Absolute 0.0 0.0 - 0.1 K/uL   Immature Granulocytes 1 %   Abs Immature Granulocytes 0.05 0.00 - 0.07 K/uL    Comment: Performed at [facility], [place] [address], [place], [place] [address]  Basic metabolic panel     Status: Abnormal   Collection Time: 10/25/20  3:29 AM  Result Value Ref Range   Sodium 137 135 - 145 mmol/L   Potassium 3.6 3.5 - 5.1 mmol/L   Chloride 102 98 - 111 mmol/L   CO2 32 22 - 32 mmol/L   Glucose, Bld 140 (H) 70 - 99 mg/dL    Comment: Glucose reference range applies only to samples taken after fasting for at least 8 hours.   BUN <5 (L) 6 - 20 mg/dL   Creatinine, Ser 0.74 0.44 - 1.00 mg/dL   Calcium 8.1 (L) 8.9 - 10.3 mg/dL   GFR, Estimated >60 >60 mL/min    Comment: (NOTE) Calculated using the CKD-EPI Creatinine Equation (2021)    Anion gap 3 (L) 5 - 15    Comment: Performed at [facility], [place] [address] [place], [place] [address]  Glucose, capillary     Status: Abnormal   Collection Time: 10/25/20  7:32 AM  Result Value Ref Range   Glucose-Capillary 149 (H) 70 - 99 mg/dL     Comment: Glucose reference range applies only to samples taken after fasting for at least 8 hours.    Studies/Results: No results found.  Medications: I have reviewed the patient's current medications.  Assessment: Post polypectomy bleed, requiring PRBC transfusion, hemoglobin 7.5 and stable today Endoclips applied at sites of polypectomy, 1 area noted with visible vessel, likely site of bleeding No further bleeding reported  Nausea and vomiting on admission, history of marijuana use, likely cyclical vomiting syndrome  Plan: Start soft diet Okay to DC home from GI standpoint. Pathology of polyps to be followed as an outpatient.  Ronnette Juniper, MD 10/25/2020, 9:34 AM

## 2020-10-27 LAB — BPAM RBC
Blood Product Expiration Date: 202207202359
Blood Product Expiration Date: 202207202359
Blood Product Expiration Date: 202207202359
ISSUE DATE / TIME: 202206180944
ISSUE DATE / TIME: 202206181322
Unit Type and Rh: 5100
Unit Type and Rh: 5100
Unit Type and Rh: 5100

## 2020-10-27 LAB — TYPE AND SCREEN
ABO/RH(D): O POS
Antibody Screen: NEGATIVE
Unit division: 0
Unit division: 0
Unit division: 0

## 2020-10-27 NOTE — Progress Notes (Signed)
Called patient for follow up call post hospitalization. Patient states that she is recovering well. Complaint of mild abdominal pain which she thinks is due to gas. Patient is taking gas-x at home for complaint. Has not had a bowel movement since discharge. RN educated patient regarding use of stool softeners and importance of follow up with PCP. Patient states that she has a PCP follow up on this Friday at 1:15pm and will be getting a repeat H&H at this visit. Patient also states that she is taking an iron supplement as prescribed and is grateful for the staff that cared for her during her stay as she had a great experience.

## 2020-11-02 ENCOUNTER — Telehealth: Payer: Self-pay | Admitting: Cardiovascular Disease

## 2020-11-02 NOTE — Telephone Encounter (Signed)
Pt c/o medication issue:  1. Name of Medication: carvedilol (COREG) 12.5 MG tablet  2. How are you currently taking this medication (dosage and times per day)? Not taking  3. Are you having a reaction (difficulty breathing--STAT)? no  4. What is your medication issue? Patient states she was taken off the medication in the hospital, because her BP was very low. She states she had internal bleeding after her colonoscopy and needed a blood transfusion. She states when she left the hospital her hemoglobin level was 7.7. She says this morning her BP is still low at 117/63 and yesterday was 110/66. She wants to know if she needs to go back on carvedilol.

## 2020-11-02 NOTE — Telephone Encounter (Signed)
Called patient back. Patient stated that she was told at discharge from hospital to talk to her doctor about continuing  Coreg 12.5 mg BID. Patient was admitted for Rectal Bleed after colonoscopy. Patient's lowest BP was 97/53 and 110/63, when at the hopsital on the last day and was told to hold her coreg, but BP was elevated when admitted. Patient has been off coreg for 1 week and her BP's are 110/66, 117/63, etc. Patient's HR 90 to 100. Patient stated her HR is usually in the 80's. Will forward to DOD, Dr. Irish Lack to see if patient needs to stop holding Coreg and start taking again.

## 2020-11-03 MED ORDER — CARVEDILOL 6.25 MG PO TABS: 6.2500 mg | ORAL_TABLET | Freq: Two times a day (BID) | ORAL | 3 refills | Status: DC

## 2020-11-03 NOTE — Telephone Encounter (Signed)
Spoke with the patient and he is aware of recommendations per Dr. Irish Lack.

## 2020-11-25 ENCOUNTER — Other Ambulatory Visit: Payer: Self-pay

## 2020-11-25 DIAGNOSIS — J4530 Mild persistent asthma, uncomplicated: Secondary | ICD-10-CM

## 2020-11-25 MED ORDER — MONTELUKAST SODIUM 10 MG PO TABS: ORAL_TABLET | ORAL | 3 refills | Status: DC

## 2020-12-08 ENCOUNTER — Other Ambulatory Visit: Payer: Self-pay

## 2020-12-08 ENCOUNTER — Ambulatory Visit (INDEPENDENT_AMBULATORY_CARE_PROVIDER_SITE_OTHER): Payer: No Typology Code available for payment source | Admitting: Podiatry

## 2020-12-08 ENCOUNTER — Encounter: Payer: Self-pay | Admitting: Podiatry

## 2020-12-08 DIAGNOSIS — M79671 Pain in right foot: Secondary | ICD-10-CM

## 2020-12-08 DIAGNOSIS — M7751 Other enthesopathy of right foot: Secondary | ICD-10-CM

## 2020-12-08 DIAGNOSIS — D2371 Other benign neoplasm of skin of right lower limb, including hip: Secondary | ICD-10-CM

## 2020-12-08 MED ORDER — DEXAMETHASONE SODIUM PHOSPHATE 120 MG/30ML IJ SOLN
2.0000 mg | Freq: Once | INTRAMUSCULAR | Status: AC
  Administered 2020-12-08: 2 mg via INTRA_ARTICULAR

## 2020-12-08 NOTE — Progress Notes (Signed)
She presents today chief complaint of painful subfifth metatarsal bilaterally.  States that the right 1 is the most painful with this core and callus.  Objective: Vital signs are stable alert oriented x3.  Pulses are palpable.  She has fluctuance beneath the fifth metatarsal head of the right foot with a solitary porokeratotic lesion.  Reactive hyperkeratotic lesion plantar aspects of the fifth met head left.  Assessment: Bursitis capsulitis of the fifth metatarsophalangeal joint right foot with benign skin lesion bilateral subphase.  Plan: Discussed etiology pathology and surgical therapies at this point went ahead and performed a injection with dexamethasone local anesthetic to the bursa.  Tolerated procedure well without complications.  Also the nucleated the lesion today bilaterally.  Follow-up with her in about a month may need to consider surgery.  If so an x-ray will be necessary.

## 2020-12-28 NOTE — Progress Notes (Signed)
Date:  12/31/2020    Veronica Rivers Date of Birth: 07-13-1965 Medical Record [id number]  PCP:  Kristen Loader, FNP  Cardiologist:  Johnsie Cancel    HPI: Veronica Rivers is a 55 y.o. female  with a history of HLD, SVT, obesity, COPD and type 2 DM. No known CAD. Echo in 2019 with normal EF. In the hospital July 2020 with atypical chest pain - felt to be GI related. Has had costochondritis in the past also. Was to have stress testing - this was never completed - she cancelled along with her follow up here.   Her dyspnea is with and without exertion. Her chest pain comes and goes - with and without exertion - some palpable pain but also not. Says it is no different from her hospitalization back in July. She is now on insulin. Her weight continues to climb. She is a former smoker.   Seen by NP 02/25/19 to clear for EGD/colonoscopy Myovue 03/05/19 normal EF 63% Cleared to have endoscopy with Dr Cyndia Skeeters [facility] Had infection in esophagus and Rx with improvement    She sees Dr Elsworth Soho for COPD 25 pack year quit in 1999 CT with apical  Emphysema  Rx with Breo and Zyzal worse with seasonal allergies   Admitted 10/25/20 with rectal bleeding 2 days after colonoscopy and polypectomy Hb 7.7 Transfused 2 units and clipping of bleeders in hepatic flexure Appears that coreg was held on d/c   Lost 15 lbs with all of the hospital issues   She has a sister with 100 pack year history active smoker Discussed getting her Into see Elsworth Soho and having CT scan  Past Medical History:  Diagnosis Date   Asthma    Bipolar depression ([place])    Cellulitis    CHEST PAIN    DM    DYSPNEA    Emphysema of lung ([place])    HYPERCHOLESTEROLEMIA    HYPERLIPIDEMIA    HYPOTHYROIDISM    Neuropathy    SUPRAVENTRICULAR TACHYCARDIA     Past Surgical History:  Procedure Laterality Date   CESAREAN SECTION     COLONOSCOPY N/A 10/24/2020   Procedure: COLONOSCOPY;  Surgeon: Ronnette Juniper, MD;  Location: [facility];  Service:  Gastroenterology;  Laterality: N/A;   FOOT SURGERY     HEMOSTASIS CLIP PLACEMENT  10/24/2020   Procedure: HEMOSTASIS CLIP PLACEMENT;  Surgeon: Ronnette Juniper, MD;  Location: [facility];  Service: Gastroenterology;;   NASAL SEPTUM SURGERY     NECK SURGERY     TONSILLECTOMY     TUBAL LIGATION       Medications: Current Meds  Medication Sig   albuterol (VENTOLIN HFA) 108 (90 Base) MCG/ACT inhaler Inhale 1-2 puffs into the lungs every 4 (four) hours as needed for wheezing or shortness of breath.   ARIPiprazole (ABILIFY) 15 MG tablet Take 15 mg by mouth daily.   busPIRone (BUSPAR) 5 MG tablet Take 5 mg by mouth 3 (three) times daily.   carvedilol (COREG) 12.5 MG tablet Take 12.5 mg by mouth 2 (two) times daily with a meal.   Cholecalciferol (VITAMIN D3) 125 MCG (5000 UT) CAPS Take 5,000 Units by mouth daily.   diclofenac Sodium (VOLTAREN) 1 % GEL Apply 2 g topically 4 (four) times daily as needed (knee pain).   doxepin (SINEQUAN) 10 MG capsule Take 10 mg by mouth at bedtime.   DULOXETINE HCL PO Take 120 mg by mouth daily at 6 (six) AM.   ferrous sulfate  325 (65 FE) MG tablet Take 325 mg by mouth daily with breakfast.   hydrocortisone cream 0.5 % Apply 1 application topically 2 (two) times daily.   lamoTRIgine (LAMICTAL) 200 MG tablet Take 200 mg by mouth daily.   levocetirizine (XYZAL) 5 MG tablet TAKE 1 TABLET(5 MG) BY MOUTH DAILY (Patient taking differently: Take 5 mg by mouth every evening.)   levothyroxine (SYNTHROID) 125 MCG tablet Take 125 mcg by mouth at bedtime.   metFORMIN (GLUCOPHAGE-XR) 500 MG 24 hr tablet Take 500 mg by mouth 2 (two) times daily.   montelukast (SINGULAIR) 10 MG tablet TAKE 1 TABLET(10 MG) BY MOUTH AT BEDTIME   ONE TOUCH ULTRA TEST test strip Inject 1 strip into the skin daily.   OZEMPIC, 1 MG/DOSE, 4 MG/3ML SOPN Inject 1 mg into the skin once a week. Monday   SYNVISC 16 MG/2ML SOSY    tiZANidine (ZANAFLEX) 4 MG tablet Take 4 mg by mouth every 6 (six) hours as  needed for muscle spasms.     Allergies: Allergies  Allergen Reactions   Omeprazole     Made her breathing worse.   Canagliflozin     Other reaction(s): yeast infections   Cyclobenzaprine     Other reaction(s): crazy in head   Dulaglutide     Other reaction(s): nausea and vomiting   Empagliflozin     Other reaction(s): yeast infections   Meloxicam Other (See Comments)    Pt stated, "Got Burning, Acid Reflux with medicine" Pt should not be on any anti-inflammatories d/t elevated LFTs   Valtrex [Valacyclovir]     Other reaction(s): agitation   Zocor [Simvastatin] Other (See Comments)    Caused muscle pain   Sulfa Antibiotics Rash   Sulfa Drugs Cross Reactors Rash    Social History: The patient  reports that she quit smoking about 23 years ago. Her smoking use included cigarettes. She has never used smokeless tobacco. She reports that she does not drink alcohol and does not use drugs.   Family History: The patient's family history includes COPD in her mother; Cancer in her father; Heart attack in her mother; Heart disease in her mother; Hypertension in her mother.   Review of Systems: Please see the history of present illness.   All other systems are reviewed and negative.   Physical Exam: VS:  Ht '5\' 9"'$  (1.753 m)   BMI 35.45 kg/m  .  BMI Body mass index is 35.45 kg/m.  Wt Readings from Last 3 Encounters:  10/24/20 108.9 kg  10/21/20 108.9 kg  07/17/20 109.8 kg    Affect appropriate Healthy:  appears stated age 41: normal Neck supple with no adenopathy JVP normal no bruits no thyromegaly Lungs clear with no wheezing and good diaphragmatic motion Heart:  S1/S2 no murmur, no rub, gallop or click PMI normal Abdomen: benighn, BS positve, no tenderness, no AAA no bruit.  No HSM or HJR Distal pulses intact with no bruits No edema Neuro non-focal Skin warm and dry No muscular weakness   LABORATORY DATA:  EKG:   SR ICRBBB nonspecific ST changes 02/25/19  12/31/2020 SR rate 67 nonspecific ST changes   Lab Results  Component Value Date   WBC 7.0 10/25/2020   HGB 7.5 (L) 10/25/2020   HCT 23.3 (L) 10/25/2020   PLT 202 10/25/2020   GLUCOSE 140 (H) 10/25/2020   CHOL 224 (H) 12/19/2018   TRIG 112 12/19/2018   HDL 65 12/19/2018   LDLCALC 137 (H) 12/19/2018   ALT 15  10/24/2020   AST 11 (L) 10/24/2020   NA 137 10/25/2020   K 3.6 10/25/2020   CL 102 10/25/2020   CREATININE 0.74 10/25/2020   BUN <5 (L) 10/25/2020   CO2 32 10/25/2020   TSH 0.39 08/07/2013   HGBA1C 8.0 (H) 11/23/2012    Other Studies Reviewed Today:  Myovue: 2019/03/30  Study Highlights    There was no ST segment deviation noted during stress. Nuclear stress EF: 63%. The left ventricular ejection fraction is normal (55-65%). The study is normal. This is a low risk study.   Fixed apical inferolateral defect with normal wall motion in that region, suggestive of artifact   Echocardiogram 12/11/2017 Study Conclusions - Left ventricle: The cavity size was normal. Wall thickness was   normal. Systolic function was normal. The estimated ejection   fraction was in the range of 55% to 60%. Wall motion was normal;   there were no regional wall motion abnormalities. Doppler   parameters are consistent with abnormal left ventricular   relaxation (grade 1 diastolic dysfunction).   Impressions: - Normal LV systolic function; mild diastolic dysfunction.  Assessment/Plan:  1. Chest pain - multiple risk factors atypical normal myovue 2019/03/30 observe  3. Swelling - dependant edema from weight October 2020 CT negative for PE PRN lasix    4. GERD - f/u with [facility] dilated CBD [place] MRI normal 03/07/20   5. DM - per PCP - now on insulin   6.  HLD - on statin  7. COPD/asthma/emphysema - prior smoker. F/u Dr Elsworth Soho   8. COVID-19 Education: The signs and symptoms of COVID-19 were discussed with the patient and how to seek care for testing (follow up with PCP or arrange  E-visit).  The importance of social distancing, staying at home, hand hygiene and wearing a mask when out in public were discussed today.  Current medicines are reviewed with the patient today.  The patient does not have concerns regarding medicines other than what has been noted above.  The following changes have been made:  PRN Lasix    No orders of the defined types were placed in this encounter.   Disposition:   F/U in a year   Time:  Spent reviewing chart myovue,echo  pulmonary notes direct patient interview and composing note 20 minutes   Signed: [name], MD  12/31/2020 9:25 AM  [facility] [address] [place], [place]  [address] Phone: [phone] Fax: [phone]

## 2020-12-31 ENCOUNTER — Encounter: Payer: Self-pay | Admitting: Cardiovascular Disease

## 2020-12-31 ENCOUNTER — Other Ambulatory Visit: Payer: Self-pay

## 2020-12-31 ENCOUNTER — Ambulatory Visit: Payer: No Typology Code available for payment source | Admitting: Cardiovascular Disease

## 2020-12-31 VITALS — BP 112/62 | HR 83 | Ht 70.0 in | Wt 230.0 lb

## 2020-12-31 DIAGNOSIS — R079 Chest pain, unspecified: Secondary | ICD-10-CM

## 2020-12-31 DIAGNOSIS — K219 Gastro-esophageal reflux disease without esophagitis: Secondary | ICD-10-CM | POA: Diagnosis not present

## 2020-12-31 DIAGNOSIS — D508 Other iron deficiency anemias: Secondary | ICD-10-CM | POA: Diagnosis not present

## 2020-12-31 MED ORDER — CARVEDILOL 12.5 MG PO TABS: 12.5000 mg | ORAL_TABLET | Freq: Two times a day (BID) | ORAL | 3 refills | Status: DC

## 2020-12-31 NOTE — Patient Instructions (Addendum)
Medication Instructions:  *If you need a refill on your cardiac medications before your next appointment, please call your pharmacy*  Lab Work: If you have labs (blood work) drawn today and your tests are completely normal, you will receive your results only by: MyChart Message (if you have MyChart) OR A paper copy in the mail If you have any lab test that is abnormal or we need to change your treatment, we will call you to review the results.  Testing/Procedures: None ordered today.  Follow-Up: At [facility], you and your health needs are our priority.  As part of our continuing mission to provide you with exceptional heart care, we have created designated Provider Care Teams.  These Care Teams include your primary Cardiologist (physician) and Advanced Practice Providers (APPs -  Physician Assistants and Nurse Practitioners) who all work together to provide you with the care you need, when you need it.  We recommend signing up for the patient portal called "MyChart".  Sign up information is provided on this After Visit Summary.  MyChart is used to connect with patients for Virtual Visits (Telemedicine).  Patients are able to view lab/test results, encounter notes, upcoming appointments, etc.  Non-urgent messages can be sent to your provider as well.   To learn more about what you can do with MyChart, go to [email or web address].    Your next appointment:   12 month(s)  The format for your next appointment:   In Person  Provider:   You may see Peter Nishan, MD or one of the following Advanced Practice Providers on your designated Care Team:   Laura Ingold, NP  

## 2021-01-12 ENCOUNTER — Ambulatory Visit: Payer: No Typology Code available for payment source | Admitting: Podiatry

## 2021-01-21 ENCOUNTER — Ambulatory Visit: Payer: PRIVATE HEALTH INSURANCE | Admitting: Adult Health

## 2021-04-21 ENCOUNTER — Other Ambulatory Visit: Payer: Self-pay | Admitting: Pulmonary Disease

## 2021-04-21 DIAGNOSIS — J432 Centrilobular emphysema: Secondary | ICD-10-CM

## 2021-05-06 ENCOUNTER — Telehealth: Payer: Self-pay | Admitting: Pulmonary Disease

## 2021-05-06 MED ORDER — FLUTICASONE FUROATE-VILANTEROL 200-25 MCG/ACT IN AEPB: 1.0000 | INHALATION_SPRAY | Freq: Every day | RESPIRATORY_TRACT | 2 refills | Status: DC

## 2021-05-06 NOTE — Telephone Encounter (Signed)
Primary Pulmonologist: Veronica Rivers Last office visit and with whom: 07/17/2020  Veronica Rivers What do we see them for (pulmonary problems): emphysema, asthma Last OV assessment/plan:   Assessment & Plan:         Assessment & Plan Note by Veronica Noel, MD at 07/17/2020 2:17 PM  Author: Rigoberto Noel, MD Author Type: Physician Filed: 07/17/2020  2:17 PM  Note Status: [place] List: Cosign Not Required Encounter Date: 07/17/2020  Problem: Centrilobular emphysema ([place])  Editor: Veronica Noel, MD (Physician)      Prior Versions: 1. Veronica Noel, MD (Physician) at 07/17/2020  2:17 PM - Written    She reports mild desaturation on sleep study that was done with [facility], will obtain a copy and review .  Her emphysema seems to be mild so I doubt that she would need oxygen        Assessment & Plan Note by Veronica Noel, MD at 07/17/2020 2:16 PM  Author: Rigoberto Noel, MD Author Type: Physician Filed: 07/17/2020  2:17 PM  Note Status: Written Cosign: Cosign Not Required Encounter Date: 07/17/2020  Problem: Asthma, well controlled, mild persistent  Editor: Veronica Noel, MD (Physician)               Her asthma appears to be well controlled on Breo, Singulair She does have seasonal allergies and we will continue Xyzal during spring and fall. I feel that her esophageal issues may contribute to worsening. If she remains stable over the next year we will consider stepdown from [facility] to inhaled steroid alone        Patient Instructions by Veronica Noel, MD at 07/17/2020 1:45 PM  Author: Rigoberto Noel, MD Author Type: Physician Filed: 07/17/2020  2:16 PM  Note Status: Signed Cosign: Cosign Not Required Encounter Date: 07/17/2020  Editor: Veronica Noel, MD (Physician)                   Refills on [place], Singulair and Xyzal. We will obtain sleep study from [place] and advise you about oxygen desaturation       Instructions   Return in about 6 months (around 01/17/2021) for TP.      Refills on Breo, Singulair and Xyzal. We will obtain sleep study from [place] and advise you about oxygen desaturation      Was appointment offered to patient (explain)?  Scheduled to see Veronica Rivers 05/11/2020 at 2:30 pm, advised to arrive by 2:15 pm   Reason for call: Cough for 2 weeks, began having green mucous come up on yesterday 12/28 and had sob last night.  She has not used her Memory Dance since she was in the hospital over the summer, she developed thrush and was taken off of it in the hospital and never started taking it again.  She did not have any refills, refills sent to her pharmacy.  She denies any fever, chills or body aches.  She tested for covid with a home test today, 12/29 and it was negative.  She has congestion in her head and chest.  She can hear rattling when she breathes.  She has not been flu tested, but received her flu vaccine for this season.  She has not been seen in the office sine 07/17/20, scheduled OV for early next week.  (examples of things to ask: : When did symptoms start? Fever? Cough? Productive? Color to sputum? More sputum than usual? Wheezing? Have you needed increased oxygen? Are you  taking your respiratory medications? What over the counter measures have you tried?)  Allergies  Allergen Reactions   Omeprazole     Made her breathing worse.   Canagliflozin     Other reaction(s): yeast infections   Cyclobenzaprine     Other reaction(s): crazy in head   Dulaglutide     Other reaction(s): nausea and vomiting   Empagliflozin     Other reaction(s): yeast infections   Meloxicam Other (See Comments)    Pt stated, "Got Burning, Acid Reflux with medicine" Pt should not be on any anti-inflammatories d/t elevated LFTs   Valtrex [Valacyclovir]     Other reaction(s): agitation   Zocor [Simvastatin] Other (See Comments)    Caused muscle pain   Sulfa Antibiotics Rash   Sulfa Drugs Cross Reactors Rash    Immunization History  Administered Date(s)  Administered   Influenza Split 02/19/2009, 05/04/2017, 02/05/2019   Influenza, Quadrivalent, Recombinant, Inj, Pf 02/05/2019   Influenza,inj,Quad PF,6+ Mos 05/04/2017, 02/07/2018   Influenza,inj,quad, With Preservative 01/20/2014, 02/24/2015   Influenza-Unspecified 02/17/2018   PFIZER(Purple Top)SARS-COV-2 Vaccination 07/11/2019, 08/10/2019, 03/05/2020   Pneumococcal Polysaccharide-23 08/21/2008, 07/16/2013   Tdap 11/10/2012, 08/11/2014

## 2021-05-11 ENCOUNTER — Ambulatory Visit (INDEPENDENT_AMBULATORY_CARE_PROVIDER_SITE_OTHER): Payer: PRIVATE HEALTH INSURANCE | Admitting: Primary Care

## 2021-05-11 ENCOUNTER — Other Ambulatory Visit: Payer: Self-pay

## 2021-05-11 ENCOUNTER — Encounter: Payer: Self-pay | Admitting: Primary Care

## 2021-05-11 DIAGNOSIS — J4541 Moderate persistent asthma with (acute) exacerbation: Secondary | ICD-10-CM | POA: Diagnosis not present

## 2021-05-11 DIAGNOSIS — J4530 Mild persistent asthma, uncomplicated: Secondary | ICD-10-CM

## 2021-05-11 MED ORDER — ALBUTEROL SULFATE HFA 108 (90 BASE) MCG/ACT IN AERS: 1.0000 | INHALATION_SPRAY | RESPIRATORY_TRACT | 3 refills | Status: DC | PRN

## 2021-05-11 MED ORDER — AMOXICILLIN-POT CLAVULANATE 875-125 MG PO TABS: 1.0000 | ORAL_TABLET | Freq: Two times a day (BID) | ORAL | 0 refills | Status: DC

## 2021-05-11 MED ORDER — PREDNISONE 10 MG PO TABS: ORAL_TABLET | ORAL | 0 refills | Status: DC

## 2021-05-11 NOTE — Patient Instructions (Addendum)
Recommendations: - Continue BREO one puff daily in the morning - Continue Xyzal and Singulair 10mg  at bedtime  - Continue guaifenesin cough syrup  - Use albuterol rescue inhaler 2 puffs every 4-6 hours as needed for breakthrough sob/wheezing  - You can add flonase nasal spray and/or ocean nasal rinses for nasal congestion  Rx: - Augmentin 1 tab twice daily x 7 days - Prednisone taper as directed   Follow-up: - Need 3-6 month regular follow-up with Dr. Elsworth Soho  - Return if symptoms do not improve or worsen in 1 week

## 2021-05-11 NOTE — Progress Notes (Signed)
@Patient  ID: Veronica Rivers, female    DOB: Jun 28, 1965, 56 y.o.   MRN: [id number]  Chief Complaint  Patient presents with   Follow-up    Patient says she's starting to cough up stuff. She said she's been wheezing a lot and using rescue inhaler everyday.     Referring provider: Kristen Loader, FNP  HPI: 56 year old female, former smoker quit in 1999. PMH significant for asthma, centrilobular emphysema, allergic rhinitis, HTN, dysphagia, type 2 diabetes, bipolar depression, chronic back pain, mixed hyperlipidemia. Patient of Dr. Elsworth Soho, last seen on 07/17/20.  Previous [facility] pulmonary encounter: 07/17/20- Dr. Elsworth Soho  56 yo remote smoker for FU of emphysema. She smoked about 1.5 packs/day before she quit in 99 about 25 pack years.   She reports a diagnosis of asthma in her 28s triggered by exercise and weather changes and chest colds. She underwent detailed allergy testing which was negative. 4  Cats + 1 dog  in the house.   Meds - She was on Symbicort x years, changed to [facility] in 2017.  Incruse was stopped after her last visit 04/2018 showing good PFTs   PMH - esophageal spasm , chest pain >> negative stress test and CT angiogram.     Last office visit 04/2019 She has flareups during spring and fall .  She is requiring increased albuterol over the past few weeks.   She has a history of esophageal spasm and is due to see GI for another dilation procedure.  She has unexplained nausea and vomiting, I note that MRCP was normal. When she had home sleep test done at [place] and was told that she has very mild OSA that does not require CPAP therapy, she was placed on methylphenidate for excessive daytime somnolence she takes this 20 twice daily and this seems to have helped.  She was also told that she has oxygen saturation dropped to the 70s in her sleep and she would like me to review   05/11/2021- Interim hx  Patient presents today for acute visit/cough. She developed cough two weeks ago.  Cough is productive with yellow-green sputum. She has associated SOB/wheezing with coughing fits. She has been taking over the counter robitussin cough syrup. She is complaint with BREO, singulair and xyzal for hx asthma. She is using albuterol twice a day with improvement for the last week. Her last flare up was approximately 1 year ago.   Significant tests/ events reviewed 02/2019 CT angiogram negative 10/2017 CT angiogram -no  evidence of pulmonary embolus,changes of bullous emphysema in apices     04/2018 PFTs ratio normal, FEV1 82%, TLC 89%, DLCO 85%    Review of Systems neg for any significant sore throat, dysphagia, itching, sneezing, nasal congestion or excess/ purulent secretions, fever, chills, sweats, unintended wt loss, pleuritic or exertional cp, hempoptysis, orthopnea pnd or change in chronic leg swelling. Also denies presyncope, palpitations, heartburn, abdominal pain, nausea, vomiting, diarrhea or change in bowel or urinary habits, dysuria,hematuria, rash, arthralgias, visual complaints, headache, numbness weakness or ataxia.      Allergies  Allergen Reactions   Omeprazole     Made her breathing worse.   Canagliflozin     Other reaction(s): yeast infections   Cyclobenzaprine     Other reaction(s): crazy in head   Dulaglutide     Other reaction(s): nausea and vomiting   Empagliflozin     Other reaction(s): yeast infections   Meloxicam Other (See Comments)    Pt stated, "Got Burning,  Acid Reflux with medicine" Pt should not be on any anti-inflammatories d/t elevated LFTs   Valtrex [Valacyclovir]     Other reaction(s): agitation   Zocor [Simvastatin] Other (See Comments)    Caused muscle pain   Sulfa Antibiotics Rash   Sulfa Drugs Cross Reactors Rash    Immunization History  Administered Date(s) Administered   Influenza Split 02/19/2009, 05/04/2017, 02/05/2019   Influenza, Quadrivalent, Recombinant, Inj, Pf 02/05/2019   Influenza,inj,Quad PF,6+ Mos 05/04/2017,  02/07/2018   Influenza,inj,quad, With Preservative 01/20/2014, 02/24/2015   Influenza-Unspecified 02/17/2018   PFIZER(Purple Top)SARS-COV-2 Vaccination 07/11/2019, 08/10/2019, 03/05/2020   Pneumococcal Polysaccharide-23 08/21/2008, 07/16/2013   Tdap 11/10/2012, 08/11/2014    Past Medical History:  Diagnosis Date   Asthma    Bipolar depression ([place])    Cellulitis    CHEST PAIN    DM    DYSPNEA    Emphysema of lung ([place])    HYPERCHOLESTEROLEMIA    HYPERLIPIDEMIA    HYPOTHYROIDISM    Neuropathy    SUPRAVENTRICULAR TACHYCARDIA     Tobacco History: Social History   Tobacco Use  Smoking Status Former   Years: 15.00   Types: Cigarettes   Quit date: 11/23/1997   Years since quitting: 23.4  Smokeless Tobacco Never   Counseling given: Not Answered   Outpatient Medications Prior to Visit  Medication Sig Dispense Refill   ARIPiprazole (ABILIFY) 15 MG tablet Take 15 mg by mouth daily.     busPIRone (BUSPAR) 5 MG tablet Take 5 mg by mouth 3 (three) times daily.     carvedilol (COREG) 12.5 MG tablet Take 1 tablet (12.5 mg total) by mouth 2 (two) times daily with a meal. 180 tablet 3   Cholecalciferol (VITAMIN D3) 125 MCG (5000 UT) CAPS Take 5,000 Units by mouth daily.     diclofenac Sodium (VOLTAREN) 1 % GEL Apply 2 g topically 4 (four) times daily as needed (knee pain).     doxepin (SINEQUAN) 10 MG capsule Take 25 mg by mouth at bedtime.     DULOXETINE HCL PO Take 120 mg by mouth daily at 6 (six) AM.     ferrous sulfate 325 (65 FE) MG tablet Take 325 mg by mouth daily with breakfast.     fluticasone furoate-vilanterol (BREO ELLIPTA) 200-25 MCG/ACT AEPB Inhale 1 puff into the lungs daily. 60 each 2   fluticasone furoate-vilanterol (BREO ELLIPTA) 200-25 MCG/INH AEPB Inhale 1 puff into the lungs daily. 180 each 5   hydrocortisone cream 0.5 % Apply 1 application topically 2 (two) times daily.     lamoTRIgine (LAMICTAL) 200 MG tablet Take 200 mg by mouth daily.     levocetirizine  (XYZAL) 5 MG tablet TAKE ONE TABLET BY MOUTH ONE TIME A DAY 30 tablet 6   levothyroxine (SYNTHROID) 125 MCG tablet Take 125 mcg by mouth at bedtime.     montelukast (SINGULAIR) 10 MG tablet TAKE 1 TABLET(10 MG) BY MOUTH AT BEDTIME 30 tablet 3   ONE TOUCH ULTRA TEST test strip Inject 1 strip into the skin daily.  3   oxyCODONE-acetaminophen (PERCOCET) 10-325 MG tablet Take 1 tablet by mouth 5 (five) times daily. As needed for pain     OZEMPIC, 1 MG/DOSE, 4 MG/3ML SOPN Inject 2 mg into the skin once a week. Monday     PROMETHEGAN 12.5 MG suppository Place 12.5 mg rectally 2 (two) times daily as needed for nausea or vomiting.     SYNVISC 16 MG/2ML SOSY      tiZANidine (ZANAFLEX) 4  MG tablet Take 4 mg by mouth every 6 (six) hours as needed for muscle spasms.     albuterol (VENTOLIN HFA) 108 (90 Base) MCG/ACT inhaler Inhale 1-2 puffs into the lungs every 4 (four) hours as needed for wheezing or shortness of breath. 6.7 g 3   fluconazole (DIFLUCAN) 200 MG tablet Take 200 mg by mouth daily. (Patient not taking: Reported on 05/11/2021)     fluticasone (FLONASE) 50 MCG/ACT nasal spray Place 1 spray into both nostrils daily as needed for allergies. (Patient not taking: Reported on 05/11/2021)     metFORMIN (GLUCOPHAGE-XR) 500 MG 24 hr tablet Take 500 mg by mouth 2 (two) times daily. (Patient not taking: Reported on 05/11/2021)     nitroGLYCERIN (NITROSTAT) 0.4 MG SL tablet Place 1 tablet (0.4 mg total) under the tongue every 5 (five) minutes as needed for chest pain. 25 tablet 3   No facility-administered medications prior to visit.   Review of Systems  Review of Systems  Constitutional: Negative.   HENT:  Positive for congestion, postnasal drip and sinus pressure.   Respiratory:  Positive for cough, shortness of breath and wheezing.     Physical Exam  BP 126/68 (BP Location: Right Arm, Patient Position: Sitting, Cuff Size: Normal)    Pulse 75    Temp 97.8 F (36.6 C) (Oral)    Ht 5\' 9"  (1.753 m)    Wt  238 lb 12.8 oz (108.3 kg)    SpO2 95%    BMI 35.26 kg/m  Physical Exam Constitutional:      General: She is not in acute distress.    Appearance: Normal appearance.  HENT:     Head: Normocephalic and atraumatic.     Mouth/Throat:     Mouth: Mucous membranes are moist.     Pharynx: Oropharynx is clear.  Cardiovascular:     Rate and Rhythm: Normal rate and regular rhythm.  Pulmonary:     Effort: Pulmonary effort is normal.     Breath sounds: Normal breath sounds.     Comments: CTA Musculoskeletal:        General: Normal range of motion.     Left lower leg: No edema.  Skin:    General: Skin is warm and dry.  Neurological:     General: No focal deficit present.     Mental Status: She is alert and oriented to person, place, and time. Mental status is at baseline.  Psychiatric:        Mood and Affect: Mood normal.        Behavior: Behavior normal.        Thought Content: Thought content normal.        Judgment: Judgment normal.     Lab Results:  CBC    Component Value Date/Time   WBC 7.0 10/25/2020 0329   RBC 2.38 (L) 10/25/2020 0329   HGB 7.5 (L) 10/25/2020 0329   HCT 23.3 (L) 10/25/2020 0329   PLT 202 10/25/2020 0329   MCV 97.9 10/25/2020 0329   MCH 31.5 10/25/2020 0329   MCHC 32.2 10/25/2020 0329   RDW 15.4 10/25/2020 0329   LYMPHSABS 3.1 10/25/2020 0329   MONOABS 0.6 10/25/2020 0329   EOSABS 0.1 10/25/2020 0329   BASOSABS 0.0 10/25/2020 0329    BMET    Component Value Date/Time   NA 137 10/25/2020 0329   NA 139 02/26/2019 0841   K 3.6 10/25/2020 0329   CL 102 10/25/2020 0329   CO2 32 10/25/2020 0329  GLUCOSE 140 (H) 10/25/2020 0329   BUN <5 (L) 10/25/2020 0329   BUN 13 02/26/2019 0841   CREATININE 0.74 10/25/2020 0329   CALCIUM 8.1 (L) 10/25/2020 0329   GFRNONAA >60 10/25/2020 0329   GFRAA 102 02/26/2019 0841    BNP No results found for: BNP  ProBNP    Component Value Date/Time   PROBNP 23.0 08/07/2013 1015    Imaging: No results  found.   Assessment & Plan:   Moderate persistent asthma with (acute) exacerbation - Acute asthma exacerbation secondary to sinobronchitis. Patient developed productive cough with intermittent sob/wheezing. Her cough is productive with purulent mucus. VSS, Lungs were clear on exam. He last flare up was approx 1 year ago. She is compliant with BREO 268mcg, Singulair and Xyzal. RX Augmentin 1 tab twice daily x 7 days and prednisone taper.   Recommendations:  - Continue BREO 242mcg one puff daily in the morning - Continue Xyzal and Singulair 10mg  at bedtime  - Continue OTC Guaifenesin cough syrup  - Use albuterol rescue inhaler 2 puffs every 4-6 hours as needed for breakthrough sob/wheezing  - Add flonase nasal spray and/or ocean nasal rinses for nasal congestion  Follow-up: - Need 3-6 month regular follow-up with Dr. Elsworth Soho  - Return if symptoms do not improve or worsen in 1 week   Martyn Ehrich, NP 05/11/2021

## 2021-05-11 NOTE — Assessment & Plan Note (Addendum)
-   Acute asthma exacerbation secondary to sinobronchitis. Patient developed productive cough with intermittent sob/wheezing. Her cough is productive with purulent mucus. VSS, Lungs were clear on exam. He last flare up was approx 1 year ago. She is compliant with BREO 268mcg, Singulair and Xyzal. RX Augmentin 1 tab twice daily x 7 days and prednisone taper.   Recommendations:  - Continue BREO 269mcg one puff daily in the morning - Continue Xyzal and Singulair 10mg  at bedtime  - Continue OTC Guaifenesin cough syrup  - Use albuterol rescue inhaler 2 puffs every 4-6 hours as needed for breakthrough sob/wheezing  - Add flonase nasal spray and/or ocean nasal rinses for nasal congestion  Follow-up: - Need 3-6 month regular follow-up with Dr. Elsworth Soho  - Return if symptoms do not improve or worsen in 1 week

## 2021-07-09 ENCOUNTER — Ambulatory Visit: Payer: PRIVATE HEALTH INSURANCE | Admitting: Adult Health

## 2021-07-15 ENCOUNTER — Other Ambulatory Visit: Payer: Self-pay | Admitting: Pulmonary Disease

## 2021-07-15 DIAGNOSIS — J4530 Mild persistent asthma, uncomplicated: Secondary | ICD-10-CM

## 2021-07-25 ENCOUNTER — Other Ambulatory Visit: Payer: Self-pay | Admitting: Pulmonary Disease

## 2021-07-25 DIAGNOSIS — J4530 Mild persistent asthma, uncomplicated: Secondary | ICD-10-CM

## 2021-08-11 ENCOUNTER — Ambulatory Visit: Payer: PRIVATE HEALTH INSURANCE | Admitting: Pulmonary Disease

## 2021-08-11 ENCOUNTER — Encounter: Payer: Self-pay | Admitting: Pulmonary Disease

## 2021-08-11 DIAGNOSIS — G4733 Obstructive sleep apnea (adult) (pediatric): Secondary | ICD-10-CM | POA: Diagnosis not present

## 2021-08-11 DIAGNOSIS — J301 Allergic rhinitis due to pollen: Secondary | ICD-10-CM | POA: Diagnosis not present

## 2021-08-11 DIAGNOSIS — J432 Centrilobular emphysema: Secondary | ICD-10-CM

## 2021-08-11 DIAGNOSIS — J9611 Chronic respiratory failure with hypoxia: Secondary | ICD-10-CM | POA: Insufficient documentation

## 2021-08-11 DIAGNOSIS — J4541 Moderate persistent asthma with (acute) exacerbation: Secondary | ICD-10-CM

## 2021-08-11 DIAGNOSIS — G4734 Idiopathic sleep related nonobstructive alveolar hypoventilation: Secondary | ICD-10-CM

## 2021-08-11 MED ORDER — FLUTICASONE PROPIONATE 50 MCG/ACT NA SUSP: 1.0000 | Freq: Every day | NASAL | 3 refills | Status: DC | PRN

## 2021-08-11 MED ORDER — LEVOCETIRIZINE DIHYDROCHLORIDE 5 MG PO TABS: ORAL_TABLET | ORAL | 6 refills | Status: DC

## 2021-08-11 NOTE — Assessment & Plan Note (Signed)
Refills on Xyzal and Flonase ?

## 2021-08-11 NOTE — Assessment & Plan Note (Signed)
Reassess with overnight nocturnal oximetry. ?If significant desaturations consider using oxygen during sleep ?

## 2021-08-11 NOTE — Patient Instructions (Addendum)
?  X Refills on xyzal & flonase ? ?Call me if yellow/ green phelgm ? ?X check ONO  ? ? ?

## 2021-08-11 NOTE — Assessment & Plan Note (Signed)
Very mild, did not require CPAP therapy. ?Weight loss encouraged as the main intervention for now and positional therapy ?

## 2021-08-11 NOTE — Progress Notes (Signed)
? ?  Subjective:  ? ? Patient ID: Veronica Rivers, female    DOB: 03-Sep-1965, 55 y.o.   MRN: [id number] ? ?HPI ? ?56 yo remote smoker for FU of emphysema. ?She smoked about 1.5 packs/day before she quit in 99 about 25 pack years. ?  ?She reports a diagnosis of asthma in her 40s triggered by exercise and weather changes and chest colds. She underwent detailed allergy testing which was negative. 4  Cats + 1 dog  in the house. ?  ?Meds - She was on Symbicort x years, changed to [facility] in 2017.  Incruse was stopped after her last visit 04/2018 showing good PFTs ?  ?PMH - esophageal spasm , chest pain >> negative stress test and CT angiogram ? ?Chief Complaint  ?Patient presents with  ? Follow-up  ?  Follow up from January with beth. Pt states she has been sick off and on. Pt has a cough and some nasal congestion just completed course of Augmentin.   ? ?Annual follow-up visit with me. ?Last seen by APP January 2023 -given Augmentin and prednisone for sinobronchitis.  She then got another course of antibiotics after a televisit in February. ?She continues to have a residual cough but wheezing is significantly improved. ?Now reports gray sputum, no fevers ?She continues to have significant nasal congestion and postnasal drip ? ? ? ?Significant tests/ events reviewed ? ?02/2019 CT angiogram negative ?10/2017 CT angiogram -no  evidence of pulmonary embolus,changes of bullous emphysema in apices   ?  ?04/2018 PFTs ratio normal, FEV1 82%, TLC 89%, DLCO 85% ? ?HST at [place] 02/2020 -very mild OSA , RDI 8/h,, methylphenidate for excessive daytime somnolence   ?- oxygen saturation lowest 74%, 301 minutes less than 89% ? ? ?Review of Systems ?neg for any significant sore throat, dysphagia, itching, sneezing, nasal congestion or excess/ purulent secretions, fever, chills, sweats, unintended wt loss, pleuritic or exertional cp, hempoptysis, orthopnea pnd or change in chronic leg swelling. Also denies presyncope, palpitations,  heartburn, abdominal pain, nausea, vomiting, diarrhea or change in bowel or urinary habits, dysuria,hematuria, rash, arthralgias, visual complaints, headache, numbness weakness or ataxia. ? ?   ?Objective:  ? Physical Exam ? ?Gen. Pleasant, obese, in no distress ?ENT - no lesions, no post nasal drip ?Neck: No JVD, no thyromegaly, no carotid bruits ?Lungs: no use of accessory muscles, no dullness to percussion, coarse BS without rales or rhonchi  ?Cardiovascular: Rhythm regular, heart sounds  normal, no murmurs or gallops, no peripheral edema ?Musculoskeletal: No deformities, no cyanosis or clubbing , no tremors ? ? ? ?   ?Assessment & Plan:  ? ? ?

## 2021-08-11 NOTE — Assessment & Plan Note (Signed)
She is significantly improved but seems to have a post bronchitic cough ?Not clear if allergies are persistent trigger.  We will provide her refills on Flonase and Xyzal. ?Do not feel like she needs more antibiotics.  Does not need more prednisone at this point. ?Continue Breo and use albuterol for rescue.  She will call if sputum changes color ?

## 2021-08-12 NOTE — Addendum Note (Signed)
Addended by: Monna Fam L on: 08/12/2021 10:49 AM ? ? Modules accepted: Orders ? ?

## 2021-08-26 ENCOUNTER — Encounter: Payer: Self-pay | Admitting: Pulmonary Disease

## 2021-09-06 ENCOUNTER — Telehealth: Payer: Self-pay | Admitting: Pulmonary Disease

## 2021-09-06 MED ORDER — NYSTATIN 100000 UNIT/ML MT SUSP: 5.0000 mL | Freq: Two times a day (BID) | OROMUCOSAL | 0 refills | Status: DC

## 2021-09-06 MED ORDER — BUDESONIDE-FORMOTEROL FUMARATE 80-4.5 MCG/ACT IN AERO
2.0000 | INHALATION_SPRAY | Freq: Two times a day (BID) | RESPIRATORY_TRACT | 5 refills | Status: DC
Start: 2021-09-06 — End: 2022-07-11

## 2021-09-06 NOTE — Telephone Encounter (Signed)
pt states that the breo medication make her have a mouth yeast infection and is asking for antibiotic to be called in. pt states diflucan works best for when this happens and also promthezaine tab also for nausea. Pt also states she'll need something in place of the breo. ? ?Pharmacy-[facility] on [address] in [place] ?

## 2021-09-06 NOTE — Telephone Encounter (Signed)
Called and spoke with patient.  Dr. Bari Mantis recommendations given.  Understanding stated. Nystatin and Symbicort sent to requested pharmacy.  Nothing further at this time. ?

## 2021-09-06 NOTE — Telephone Encounter (Signed)
Patient stated Breo inhaler caused yeast in her mouth and is requesting antibiotic to clear it up.  Patient stated she can take Diflucan, but it causes nausea.  Patient request if Diflucan is sent to pharmacy, can she get promethazine sent in for nausea? ?Patient request prescriptions go to [facility]. ? ? ?Message routed to Dr. Elsworth Soho ? ?

## 2021-09-15 ENCOUNTER — Telehealth: Payer: Self-pay | Admitting: Pulmonary Disease

## 2021-09-15 DIAGNOSIS — G4734 Idiopathic sleep related nonobstructive alveolar hypoventilation: Secondary | ICD-10-CM

## 2021-09-15 NOTE — Telephone Encounter (Signed)
ONO showed desat <88% for 2h , lowest 79% ?Would qualify for oxygen ?OK to Rx 2L O2 during sleep ,if she is willing ?Alternatively, she would qualify for CPAP therapy for OSA ? ?OV in 2-3 months to assess ? ?

## 2021-09-17 NOTE — Telephone Encounter (Signed)
Spoke with patient regarding ONO results. They verbalized understanding. No further questions. Order for oxygen has been placed.  ?Nothing further needed at this time.  ?

## 2021-10-08 ENCOUNTER — Telehealth: Payer: Self-pay | Admitting: Pulmonary Disease

## 2021-10-08 DIAGNOSIS — G4734 Idiopathic sleep related nonobstructive alveolar hypoventilation: Secondary | ICD-10-CM

## 2021-10-08 NOTE — Telephone Encounter (Signed)
Spoke with the pt  She only uses o2 with sleep  She is asking that we order POC for travel for her to take with her to the beach next wk  Dr Elsworth Soho, please advise, thanks!

## 2021-10-08 NOTE — Telephone Encounter (Signed)
Patient is on oxygen at night and would like to go to the beach next weekend, so she is needing a portable oxygen tank. Patient called [facility] and they need an order for one. Please advise, call back [phone].

## 2021-10-12 NOTE — Telephone Encounter (Signed)
Rigoberto Noel, MD to Me      9:53 AM Okay to order   Order placed  [facility] for the pt

## 2022-01-04 ENCOUNTER — Telehealth: Payer: Self-pay | Admitting: Pulmonary Disease

## 2022-01-04 DIAGNOSIS — J4530 Mild persistent asthma, uncomplicated: Secondary | ICD-10-CM

## 2022-01-04 MED ORDER — ALBUTEROL SULFATE HFA 108 (90 BASE) MCG/ACT IN AERS: 1.0000 | INHALATION_SPRAY | RESPIRATORY_TRACT | 3 refills | Status: DC | PRN

## 2022-01-04 NOTE — Telephone Encounter (Signed)
Rx for pt's albuterol inhaler has been sent to preferred pharmacy for pt. Called and spoke with pt letting her know that this had been done and she verbalized understanding. Nothing further needed.

## 2022-01-31 NOTE — Progress Notes (Deleted)
Cardiology Office Note:    Date:  01/31/2022   ID:  KIMBERLYN QUIOCHO, DOB 1965/05/28, MRN [id number]  PCP:  Kristen Loader, [place] [facility] Cardiologist:  [name], MD { Click to update primary MD,subspecialty MD or APP then REFRESH:1}  *** Referring MD: Kristen Loader, FNP   Chief Complaint:  No chief complaint on file. {Click here for Visit Info    :1}    History of Present Illness:   Veronica Rivers is a 56 y.o. female with   with a history of HLD, SVT, obesity, COPD and type 2 DM. No known CAD. Echo in 2019 with normal EF. In the hospital July 2020 with atypical chest pain - felt to be GI related. Has had costochondritis in the past also. Was to have stress testing - this was never completed -   Seen by NP 02/25/19 to clear for EGD/colonoscopy Myovue 03/05/19 normal EF 63% Cleared to have endoscopy with Dr Cyndia Skeeters [facility] Had infection in esophagus and Rx with improvement     She sees Dr Elsworth Soho for COPD 25 pack year quit in 1999 CT with apical  Emphysema    Last saw Dr. Johnsie Cancel 12/2020 and was stable.    Past Medical History:  Diagnosis Date   Asthma    Bipolar depression ([place])    Cellulitis    CHEST PAIN    DM    DYSPNEA    Emphysema of lung ([place])    HYPERCHOLESTEROLEMIA    HYPERLIPIDEMIA    HYPOTHYROIDISM    Neuropathy    SUPRAVENTRICULAR TACHYCARDIA    Current Medications: No outpatient medications have been marked as taking for the 02/01/22 encounter (Appointment) with Imogene Burn, PA-C.    Allergies:   Omeprazole, Canagliflozin, Cyclobenzaprine, Dulaglutide, Empagliflozin, Meloxicam, Valtrex [valacyclovir], Zocor [simvastatin], Sulfa antibiotics, and Sulfa drugs cross reactors   Social History   Tobacco Use   Smoking status: Former    Years: 15.00    Types: Cigarettes    Quit date: 11/23/1997    Years since quitting: 24.2   Smokeless tobacco: Never  Vaping Use   Vaping Use: Never used  Substance Use Topics   Alcohol use:  No   Drug use: No    Family Hx: The patient's family history includes COPD in her mother; Cancer in her father; Heart attack in her mother; Heart disease in her mother; Hypertension in her mother.  ROS   EKGs/Labs/Other Test Reviewed:    EKG:  EKG is *** ordered today.  The ekg ordered today demonstrates ***  Recent Labs: No results found for requested labs within last 365 days.   Recent Lipid Panel No results for input(s): "CHOL", "TRIG", "HDL", "VLDL", "LDLCALC", "LDLDIRECT" in the last 8760 hours.   Prior CV Studies: {Select studies to display:26339}  Myovue: 03/05/19  Study Highlights     There was no ST segment deviation noted during stress. Nuclear stress EF: 63%. The left ventricular ejection fraction is normal (55-65%). The study is normal. This is a low risk study.   Fixed apical inferolateral defect with normal wall motion in that region, suggestive of artifact     Echocardiogram 12/11/2017 Study Conclusions - Left ventricle: The cavity size was normal. Wall thickness was   normal. Systolic function was normal. The estimated ejection   fraction was in the range of 55% to 60%. Wall motion was normal;   there were no regional wall motion abnormalities. Doppler   parameters  are consistent with abnormal left ventricular   relaxation (grade 1 diastolic dysfunction).   Impressions: - Normal LV systolic function; mild diastolic dysfunction.      Risk Assessment/Calculations/Metrics:   {Does this patient have ATRIAL FIBRILLATION?:[id number]}     No BP recorded.  {Refresh Note OR Click here to enter BP  :1}***    Physical Exam:    VS:  There were no vitals taken for this visit.    Wt Readings from Last 3 Encounters:  08/11/21 244 lb 9.6 oz (110.9 kg)  05/11/21 238 lb 12.8 oz (108.3 kg)  12/31/20 230 lb (104.3 kg)    Physical Exam  GEN: Well nourished, well developed, in no acute distress  HEENT: normal  Neck: no JVD, carotid bruits, or  masses Cardiac:RRR; no murmurs, rubs, or gallops  Respiratory:  clear to auscultation bilaterally, normal work of breathing GI: soft, nontender, nondistended, + BS Ext: without cyanosis, clubbing, or edema, Good distal pulses bilaterally MS: no deformity or atrophy  Skin: warm and dry, no rash Neuro:  Alert and Oriented x 3, Strength and sensation are intact Psych: euthymic mood, full affect       ASSESSMENT & PLAN:   No problem-specific Assessment & Plan notes found for this encounter.   1. Chest pain - multiple risk factors atypical normal myovue 03/05/19 observe   3. Swelling - dependant edema from weight October 2020 CT negative for PE PRN lasix     4. GERD - f/u with [facility] dilated CBD [place] MRI normal 03/07/20    5. DM - per PCP - now on insulin    6.  HLD - on statin   7. COPD/asthma/emphysema - prior smoker. F/u Dr Elsworth Soho       {Are you ordering a CV Procedure (e.g. stress test, cath, DCCV, TEE, etc)?   Press F2        :[id number]}   Dispo:  No follow-ups on file.   Medication Adjustments/Labs and Tests Ordered: Current medicines are reviewed at length with the patient today.  Concerns regarding medicines are outlined above.  Tests Ordered: No orders of the defined types were placed in this encounter.  Medication Changes: No orders of the defined types were placed in this encounter.  Signed, Ermalinda Barrios, PA-C  01/31/2022 2:51 PM    [address], [place], [place]  [address] Phone: [phone]; Fax: [phone]

## 2022-02-01 ENCOUNTER — Other Ambulatory Visit: Payer: Self-pay | Admitting: *Deleted

## 2022-02-01 ENCOUNTER — Ambulatory Visit: Payer: PRIVATE HEALTH INSURANCE | Attending: Physician Assistant | Admitting: Physician Assistant

## 2022-02-01 DIAGNOSIS — J4530 Mild persistent asthma, uncomplicated: Secondary | ICD-10-CM

## 2022-02-01 MED ORDER — MONTELUKAST SODIUM 10 MG PO TABS: ORAL_TABLET | ORAL | 5 refills | Status: DC

## 2022-02-04 ENCOUNTER — Other Ambulatory Visit: Payer: Self-pay | Admitting: Cardiovascular Disease

## 2022-02-16 ENCOUNTER — Telehealth: Payer: Self-pay | Admitting: Pulmonary Disease

## 2022-02-16 MED ORDER — AZITHROMYCIN 250 MG PO TABS: ORAL_TABLET | ORAL | 0 refills | Status: DC

## 2022-02-16 NOTE — Telephone Encounter (Signed)
Called and spoke with patient. She verbalized understanding. Zpak has been sent in.   Nothing further needed at time of call.  

## 2022-02-16 NOTE — Telephone Encounter (Signed)
Called and spoke with patient. Patient stated that she got a cold last week and started coughing on Wednesday. Patient says the cough got better over the weekend but it came back and she is coughing up yellow sputum. Patient also stated that it hurts for her to take a deep breath.   Patient wants to know if she can get something called in to the pharmacy.   RA, please advise.

## 2022-02-28 ENCOUNTER — Other Ambulatory Visit: Payer: Self-pay | Admitting: Cardiovascular Disease

## 2022-03-02 ENCOUNTER — Other Ambulatory Visit: Payer: Self-pay | Admitting: Cardiovascular Disease

## 2022-03-17 ENCOUNTER — Other Ambulatory Visit: Payer: Self-pay | Admitting: Cardiovascular Disease

## 2022-04-07 ENCOUNTER — Ambulatory Visit: Payer: No Typology Code available for payment source | Admitting: Podiatry

## 2022-04-07 ENCOUNTER — Ambulatory Visit: Payer: PRIVATE HEALTH INSURANCE | Admitting: Podiatry

## 2022-04-14 ENCOUNTER — Ambulatory Visit: Payer: PRIVATE HEALTH INSURANCE | Admitting: Podiatry

## 2022-04-14 ENCOUNTER — Encounter: Payer: Self-pay | Admitting: Podiatry

## 2022-04-14 VITALS — BP 118/54 | HR 63

## 2022-04-14 DIAGNOSIS — L600 Ingrowing nail: Secondary | ICD-10-CM | POA: Diagnosis not present

## 2022-04-14 DIAGNOSIS — D2371 Other benign neoplasm of skin of right lower limb, including hip: Secondary | ICD-10-CM

## 2022-04-14 DIAGNOSIS — M7751 Other enthesopathy of right foot: Secondary | ICD-10-CM | POA: Diagnosis not present

## 2022-04-14 MED ORDER — DEXAMETHASONE SODIUM PHOSPHATE 120 MG/30ML IJ SOLN
2.0000 mg | Freq: Once | INTRAMUSCULAR | Status: AC
  Administered 2022-04-14: 2 mg via INTRA_ARTICULAR

## 2022-04-14 MED ORDER — NEOMYCIN-POLYMYXIN-HC 1 % OT SOLN: OTIC | 1 refills | Status: DC

## 2022-04-14 NOTE — Patient Instructions (Signed)

## 2022-04-17 NOTE — Progress Notes (Signed)
She presents today chief complaint of a painful hallux right medial border has been tender times the past several weeks.  She tried trimming it but no success.  She also is complaining of a painful area beneath the fifth metatarsal head of the right foot.  Objective: Vital signs are stable she is alert and oriented x 3.  Pulses are palpable.  She has a palpable bursa beneath the fifth metatarsal head of the right foot with an overlying reactive hyperkeratotic lesion.  She also has mild to moderate erythema with an ingrown nail along the tibial border of the hallux right.  Exquisitely tender on palpation no purulence no malodor is noted.  Assessment: Subfifth metatarsal head bursitis with benign skin lesion right foot.  Also demonstrates painful ingrown toenail tibial border of the hallux right.  Plan: Discussed etiology pathology conservative surgical therapies injected 2 mg of dexamethasone beneath the lesion into the bursa at the fifth metatarsal head plantarly.  I also debrided the reactive hyperkeratotic lesion.  Medical matricectomy was performed to the tibial border of the hallux right after local anesthetic was administered.  She tolerated procedure well and was given both oral and written home-going instruction for the care and soaking of the toe as well as a prescription for Corticosporin otic to be applied twice daily after soaking.  I will follow-up with her in about 2 to 3 weeks to make sure this is healing well.  Should she have questions or concerns she will notify us immediately.

## 2022-04-25 ENCOUNTER — Other Ambulatory Visit: Payer: Self-pay

## 2022-04-25 MED ORDER — CARVEDILOL 12.5 MG PO TABS: 12.5000 mg | ORAL_TABLET | Freq: Two times a day (BID) | ORAL | 0 refills | Status: DC

## 2022-04-25 NOTE — Telephone Encounter (Signed)
Pt's medication was sent to pt's pharmacy as requested. Confirmation received.  °

## 2022-04-25 NOTE — Progress Notes (Signed)
Date:  04/29/2022    Veronica Rivers Date of Birth: 1965/12/23 Medical Record #[id number]  PCP:  Kristen Loader, FNP  Cardiologist:  Johnsie Cancel    HPI: Veronica Rivers is a 56 y.o. female  with a history of HLD, SVT, obesity, COPD and type 2 DM. No known CAD. Echo in 2019 with normal EF. In the hospital July 2020 with atypical chest pain - felt to be GI related. Has had costochondritis in the past also. Was to have stress testing - this was never completed - she cancelled along with her follow up here.   Her dyspnea is with and without exertion. Her chest pain comes and goes - with and without exertion - some palpable pain but also not. Says it is no different from her hospitalization back in July. She is now on insulin. Her weight continues to climb. She is a former smoker.   Seen by NP 02/25/19 to clear for EGD/colonoscopy Myovue 03/05/19 normal EF 63% Cleared to have endoscopy with Dr Cyndia Ske[facility] Had infection in esophagus and Rx with improvement    She sees Dr Elsworth Soho for COPD 25 pack year quit in 1999 CT with apical  Emphysema  Rx with Breo and Zyzal worse with seasonal allergies   Admitted 10/25/20 with rectal bleeding 2 days after colonoscopy and polypectomy Hb 7.7 Transfused 2 units and clipping of bleeders in hepatic flexure Appears that coreg was held on d/c   Lost 15 lbs with all of the hospital issues   Has a painful right fifth metatarsal joint with steroid injection and debridement of callous Dr Milinda Pointer  Going through some depression/anxiety Has a long term therapist and now on [employer] bad since October Husband and daughter helpful   Past Medical History:  Diagnosis Date   Asthma    Bipolar depression ([place])    Cellulitis    CHEST PAIN    DM    DYSPNEA    Emphysema of lung ([place])    HYPERCHOLESTEROLEMIA    HYPERLIPIDEMIA    HYPOTHYROIDISM    Neuropathy    SUPRAVENTRICULAR TACHYCARDIA     Past Surgical History:  Procedure Laterality Date   CESAREAN  SECTION     COLONOSCOPY N/A 10/24/2020   Procedure: COLONOSCOPY;  Surgeon: Ronnette Juniper, MD;  Location: [facility];  Service: Gastroenterology;  Laterality: N/A;   FOOT SURGERY     HEMOSTASIS CLIP PLACEMENT  10/24/2020   Procedure: HEMOSTASIS CLIP PLACEMENT;  Surgeon: Ronnette Juniper, MD;  Location: [facility];  Service: Gastroenterology;;   NASAL SEPTUM SURGERY     NECK SURGERY     TONSILLECTOMY     TUBAL LIGATION       Medications: Current Meds  Medication Sig   albuterol (VENTOLIN HFA) 108 (90 Base) MCG/ACT inhaler Inhale 1-2 puffs into the lungs every 4 (four) hours as needed for wheezing or shortness of breath.   ARIPiprazole (ABILIFY) 15 MG tablet Take 15 mg by mouth daily.   azithromycin (ZITHROMAX) 250 MG tablet Take 2 tablets on first day, then 1 tablet daily until finished.   budesonide-formoterol (SYMBICORT) 80-4.5 MCG/ACT inhaler Inhale 2 puffs into the lungs in the morning and at bedtime.   busPIRone (BUSPAR) 5 MG tablet Take 5 mg by mouth 3 (three) times daily.   carvedilol (COREG) 12.5 MG tablet Take 1 tablet (12.5 mg total) by mouth 2 (two) times daily with a meal.   Cholecalciferol (VITAMIN D3) 125 MCG (5000 UT) CAPS  Take 5,000 Units by mouth daily.   diclofenac Sodium (VOLTAREN) 1 % GEL Apply 2 g topically 4 (four) times daily as needed (knee pain).   doxepin (SINEQUAN) 10 MG capsule Take 25 mg by mouth at bedtime.   DULOXETINE HCL PO Take 120 mg by mouth daily at 6 (six) AM.   fluticasone (FLONASE) 50 MCG/ACT nasal spray Place 1 spray into both nostrils daily as needed for allergies.   hydrocortisone cream 0.5 % Apply 1 application topically 2 (two) times daily.   lamoTRIgine (LAMICTAL) 200 MG tablet Take 200 mg by mouth daily.   levocetirizine (XYZAL) 5 MG tablet TAKE ONE TABLET BY MOUTH ONE TIME A DAY   levothyroxine (SYNTHROID) 125 MCG tablet Take 125 mcg by mouth at bedtime.   metFORMIN (GLUCOPHAGE-XR) 500 MG 24 hr tablet Take 500 mg by mouth 2 (two) times daily.    montelukast (SINGULAIR) 10 MG tablet TAKE ONE TABLET BY MOUTH AT BEDTIME   NEOMYCIN-POLYMYXIN-HYDROCORTISONE (CORTISPORIN) 1 % SOLN OTIC solution Apply 1-2 drops to toe BID after soaking   nystatin (MYCOSTATIN) 100000 UNIT/ML suspension Take 5 mLs (500,000 Units total) by mouth 2 (two) times daily.   ONE TOUCH ULTRA TEST test strip Inject 1 strip into the skin daily.   oxyCODONE-acetaminophen (PERCOCET) 10-325 MG tablet Take 1 tablet by mouth 5 (five) times daily. As needed for pain   OZEMPIC, 1 MG/DOSE, 4 MG/3ML SOPN Inject 2 mg into the skin once a week. Monday   PROMETHEGAN 12.5 MG suppository Place 12.5 mg rectally 2 (two) times daily as needed for nausea or vomiting.   SYNVISC 16 MG/2ML SOSY    tiZANidine (ZANAFLEX) 4 MG tablet Take 4 mg by mouth every 6 (six) hours as needed for muscle spasms.     Allergies: Allergies  Allergen Reactions   Omeprazole     Made her breathing worse.   Canagliflozin     Other reaction(s): yeast infections   Cyclobenzaprine     Other reaction(s): crazy in head   Dulaglutide     Other reaction(s): nausea and vomiting   Empagliflozin     Other reaction(s): yeast infections   Ezetimibe Other (See Comments)   Meloxicam Other (See Comments)    Pt stated, "Got Burning, Acid Reflux with medicine" Pt should not be on any anti-inflammatories d/t elevated LFTs   Ozempic (0.25 Or 0.5 Mg-Dose) [Semaglutide(0.25 Or 0.'5mg'$ -Dos)] Nausea And Vomiting   Valtrex [Valacyclovir]     Other reaction(s): agitation   Zocor [Simvastatin] Other (See Comments)    Caused muscle pain   Sulfa Antibiotics Rash   Sulfa Drugs Cross Reactors Rash    Social History: The patient  reports that she quit smoking about 24 years ago. Her smoking use included cigarettes. She has never used smokeless tobacco. She reports that she does not drink alcohol and does not use drugs.   Family History: The patient's family history includes COPD in her mother; Cancer in her father; Heart  attack in her mother; Heart disease in her mother; Hypertension in her mother.   Review of Systems: Please see the history of present illness.   All other systems are reviewed and negative.   Physical Exam: VS:  BP 120/82   Pulse 65   Ht '5\' 10"'$  (1.778 m)   Wt 257 lb (116.6 kg)   SpO2 99%   BMI 36.88 kg/m  .  BMI Body mass index is 36.88 kg/m.  Wt Readings from Last 3 Encounters:  04/29/22 257 lb (116.6  kg)  08/11/21 244 lb 9.6 oz (110.9 kg)  05/11/21 238 lb 12.8 oz (108.3 kg)    Affect appropriate Healthy:  appears stated age 43: normal Neck supple with no adenopathy JVP normal no bruits no thyromegaly Lungs clear with no wheezing and good diaphragmatic motion Heart:  S1/S2 no murmur, no rub, gallop or click PMI normal Abdomen: benighn, BS positve, no tenderness, no AAA no bruit.  No HSM or HJR Distal pulses intact with no bruits No edema Neuro non-focal Skin warm and dry No muscular weakness   LABORATORY DATA:  EKG:    04/29/2022 SR rate 65 nonspecific ST changes    Lab Results  Component Value Date   WBC 7.0 10/25/2020   HGB 7.5 (L) 10/25/2020   HCT 23.3 (L) 10/25/2020   PLT 202 10/25/2020   GLUCOSE 140 (H) 10/25/2020   CHOL 224 (H) 12/19/2018   TRIG 112 12/19/2018   HDL 65 12/19/2018   LDLCALC 137 (H) 12/19/2018   ALT 15 10/24/2020   AST 11 (L) 10/24/2020   NA 137 10/25/2020   K 3.6 10/25/2020   CL 102 10/25/2020   CREATININE 0.74 10/25/2020   BUN <5 (L) 10/25/2020   CO2 32 10/25/2020   TSH 0.39 08/07/2013   HGBA1C 8.0 (H) 11/23/2012    Other Studies Reviewed Today:  Myovue: 03/06/2019  Study Highlights    There was no ST segment deviation noted during stress. Nuclear stress EF: 63%. The left ventricular ejection fraction is normal (55-65%). The study is normal. This is a low risk study.   Fixed apical inferolateral defect with normal wall motion in that region, suggestive of artifact   Echocardiogram 12/11/2017 Study Conclusions -  Left ventricle: The cavity size was normal. Wall thickness was   normal. Systolic function was normal. The estimated ejection   fraction was in the range of 55% to 60%. Wall motion was normal;   there were no regional wall motion abnormalities. Doppler   parameters are consistent with abnormal left ventricular   relaxation (grade 1 diastolic dysfunction).   Impressions: - Normal LV systolic function; mild diastolic dysfunction.  Assessment/Plan:  1. Chest pain - multiple risk factors atypical normal myovue 03/06/19 observe  3. Swelling - dependant edema from weight October 2020 CT negative for PE PRN lasix    4. GERD - f/u with [facility] dilated CBD [place] MRI normal 03/07/20   5. DM - per PCP - now on insulin   6.  HLD - on statin  7. COPD/asthma/emphysema - prior smoker. F/u Dr Elsworth Soho   8.  Anxiety/Depression:  worse now on Buspar f/u [facility]   The following changes have been made:  PRN Lasix    No orders of the defined types were placed in this encounter.    Disposition:   F/U in a year   Time:  Spent reviewing chart myovue,echo  pulmonary notes direct patient interview and composing note 20 minutes   Signed: [name], MD  04/29/2022 8:48 AM  [facility] [address] [place], [place]  [address] Phone: [phone] Fax: [phone]

## 2022-04-28 ENCOUNTER — Ambulatory Visit: Payer: PRIVATE HEALTH INSURANCE | Admitting: Podiatry

## 2022-04-28 ENCOUNTER — Encounter: Payer: Self-pay | Admitting: Podiatry

## 2022-04-28 DIAGNOSIS — L600 Ingrowing nail: Secondary | ICD-10-CM

## 2022-04-28 DIAGNOSIS — Z9889 Other specified postprocedural states: Secondary | ICD-10-CM

## 2022-04-28 NOTE — Progress Notes (Signed)
She presents today for nail check hallux right.  States that is doing good.  She denies fever chills nausea vomit muscle aches pains that she is to continue to soak on a regular basis.  Objective: Hallux right tibial border demonstrates no erythema edema salines drainage odor appears to be healing very nicely no tenderness on palpation.  Assessment: Well-healing surgical toe.  Plan: I would recommend that she continue to soak Epsom salts warm water at least every other day follow-up with me on an as-needed basis.

## 2022-04-29 ENCOUNTER — Encounter: Payer: Self-pay | Admitting: Cardiovascular Disease

## 2022-04-29 ENCOUNTER — Ambulatory Visit: Payer: PRIVATE HEALTH INSURANCE | Attending: Cardiovascular Disease | Admitting: Cardiovascular Disease

## 2022-04-29 VITALS — BP 120/82 | HR 65 | Ht 70.0 in | Wt 257.0 lb

## 2022-04-29 DIAGNOSIS — R079 Chest pain, unspecified: Secondary | ICD-10-CM | POA: Diagnosis not present

## 2022-04-29 DIAGNOSIS — I10 Essential (primary) hypertension: Secondary | ICD-10-CM

## 2022-04-29 DIAGNOSIS — F419 Anxiety disorder, unspecified: Secondary | ICD-10-CM

## 2022-04-29 DIAGNOSIS — J432 Centrilobular emphysema: Secondary | ICD-10-CM

## 2022-04-29 NOTE — Patient Instructions (Signed)
Medication Instructions:  NO CHANGES *If you need a refill on your cardiac medications before your next appointment, please call your pharmacy*   Lab Work: NONE If you have labs (blood work) drawn today and your tests are completely normal, you will receive your results only by: [place] (if you have MyChart) OR A paper copy in the mail If you have any lab test that is abnormal or we need to change your treatment, we will call you to review the results.   Testing/Procedures: NONE   Follow-Up: At [facility], you and your health needs are our priority.  As part of our continuing mission to provide you with exceptional heart care, we have created designated Provider Care Teams.  These Care Teams include your primary Cardiologist (physician) and Advanced Practice Providers (APPs -  Physician Assistants and Nurse Practitioners) who all work together to provide you with the care you need, when you need it.  We recommend signing up for the patient portal called "MyChart".  Sign up information is provided on this After Visit Summary.  MyChart is used to connect with patients for Virtual Visits (Telemedicine).  Patients are able to view lab/test results, encounter notes, upcoming appointments, etc.  Non-urgent messages can be sent to your provider as well.   To learn more about what you can do with MyChart, go to [email or web address].    Your next appointment:    7 month(s)  The format for your next appointment:   In Person  Provider:   [name], MD     Other Instructions NONE  Important Information About Sugar

## 2022-05-21 ENCOUNTER — Other Ambulatory Visit: Payer: Self-pay | Admitting: Cardiovascular Disease

## 2022-07-11 ENCOUNTER — Telehealth (HOSPITAL_BASED_OUTPATIENT_CLINIC_OR_DEPARTMENT_OTHER): Payer: Self-pay | Admitting: Pulmonary Disease

## 2022-07-11 ENCOUNTER — Ambulatory Visit (INDEPENDENT_AMBULATORY_CARE_PROVIDER_SITE_OTHER): Payer: PRIVATE HEALTH INSURANCE | Admitting: Primary Care

## 2022-07-11 ENCOUNTER — Encounter: Payer: Self-pay | Admitting: Primary Care

## 2022-07-11 ENCOUNTER — Ambulatory Visit (INDEPENDENT_AMBULATORY_CARE_PROVIDER_SITE_OTHER): Payer: PRIVATE HEALTH INSURANCE

## 2022-07-11 VITALS — BP 146/78 | HR 72 | Temp 98.0°F | Ht 69.0 in | Wt 267.4 lb

## 2022-07-11 DIAGNOSIS — J301 Allergic rhinitis due to pollen: Secondary | ICD-10-CM

## 2022-07-11 DIAGNOSIS — J4541 Moderate persistent asthma with (acute) exacerbation: Secondary | ICD-10-CM

## 2022-07-11 DIAGNOSIS — G4734 Idiopathic sleep related nonobstructive alveolar hypoventilation: Secondary | ICD-10-CM

## 2022-07-11 MED ORDER — FLUTICASONE FUROATE-VILANTEROL 200-25 MCG/ACT IN AEPB: 1.0000 | INHALATION_SPRAY | Freq: Every day | RESPIRATORY_TRACT | 1 refills | Status: DC

## 2022-07-11 NOTE — Assessment & Plan Note (Addendum)
-   Pateint developed asthma symptoms 2-3 weeks ago with associated bronchitis. She was sent in Levaquin and prednisone by UC provider, I have told her it is ok to start taking medication. Not currently on mainteance inhaler d/t insurance coverage. Advised she restart BREO 248mg one puff daily. Needs CXR today d/t rales heard on exam left lower lobe. FU in 1 week.

## 2022-07-11 NOTE — Progress Notes (Signed)
CXR normal.

## 2022-07-11 NOTE — Assessment & Plan Note (Signed)
-   Resume flonase nasal spray daily  - Continue Xyzal '5mg'$  daily

## 2022-07-11 NOTE — Assessment & Plan Note (Signed)
-   Continue 2L oxygen at bedtime/ No daytime requirements

## 2022-07-11 NOTE — Progress Notes (Signed)
$'@Patient'b$  ID: Veronica Rivers, female    DOB: 1965-09-24, 57 y.o.   MRN: [id number]  Chief Complaint  Patient presents with   Acute Visit    Cough, wheeze x 2 weeks.  SOB x 3 days.  Oxygen qhs at 2 lpm continuous    Referring provider: Kristen Loader, FNP  HPI: 57 year old female, former smoker quit 1999.  Past medical history significant for asthma, emphysema, allergic rhinitis, hypertension, obstructive sleep apnea, type 2 diabetes, bipolar depression.  Patient of Dr. Elsworth Soho, last seen in office on 08/11/2021.  07/11/2022 Patient presents today for acute office visit.   She developed cough with associated wheezing x 2 weeks   Increased shortness of breath over the last 3 days. Cough is productive, thick purulent mucus.  She has been out of Symbicort 46mg since January 2024 d/t insurance coverage She has an old prescription for BREO 2020m at home  She has been requiring Albuterol 2-3 times a day  She takes Xyzal 5 mg daily Not currently using Flonase nasal spray She wears 2 L of oxygen at bedtime  Allergies  Allergen Reactions   Omeprazole     Made her breathing worse.   Canagliflozin     Other reaction(s): yeast infections   Cyclobenzaprine     Other reaction(s): crazy in head   Dulaglutide     Other reaction(s): nausea and vomiting   Empagliflozin     Other reaction(s): yeast infections   Ezetimibe Other (See Comments)   Meloxicam Other (See Comments)    Pt stated, "Got Burning, Acid Reflux with medicine" Pt should not be on any anti-inflammatories d/t elevated LFTs   Ozempic (0.25 Or 0.5 Mg-Dose) [Semaglutide(0.25 Or 0.'5mg'$ -Dos)] Nausea And Vomiting   Valtrex [Valacyclovir]     Other reaction(s): agitation   Zocor [Simvastatin] Other (See Comments)    Caused muscle pain   Sulfa Antibiotics Rash   Sulfa Drugs Cross Reactors Rash    Immunization History  Administered Date(s) Administered   Influenza Split 02/19/2009, 05/04/2017, 02/05/2019   Influenza, Quadrivalent,  Recombinant, Inj, Pf 02/05/2019   Influenza,inj,Quad PF,6+ Mos 05/04/2017, 02/07/2018   Influenza,inj,quad, With Preservative 01/20/2014, 02/24/2015   Influenza-Unspecified 02/17/2018   PFIZER(Purple Top)SARS-COV-2 Vaccination 07/11/2019, 08/10/2019, 03/05/2020   Pneumococcal Polysaccharide-23 08/21/2008, 07/16/2013   Tdap 11/10/2012, 08/11/2014    Past Medical History:  Diagnosis Date   Asthma    B[place] depression ([facility] [place]   Cellulitis    CHEST PAIN    DM    DYSPNEA    Emphysema of lung ([facility]   HYPERCHOLESTEROLEMIA    HYPERLIPIDEMIA    HYPOTHYROIDISM    Neuropathy    SUPRAVENTRICULAR TACHYCARDIA     Tobacco History: Social History   Tobacco Use  Smoking Status Former   Years: 15.00   Types: Cigarettes   Quit date: 11/23/1997   Years since quitting: 24.6  Smokeless Tobacco Never   Counseling given: Not Answered   Outpatient Medications Prior to Visit  Medication Sig Dispense Refill   albuterol (VENTOLIN HFA) 108 (90 Base) MCG/ACT inhaler Inhale 1-2 puffs into the lungs every 4 (four) hours as needed for wheezing or shortness of breath. 18 g 3   carvedilol (COREG) 12.5 MG tablet Take 1 tablet (12.5 mg total) by mouth 2 (two) times daily with a meal. 180 tablet 3   Cholecalciferol (VITAMIN D3) 125 MCG (5000 UT) CAPS Take 5,000 Units by mouth daily.     diclofenac Sodium (VOLTAREN) 1 % GEL Apply 2 g topically  4 (four) times daily as needed (knee pain).     fluticasone (FLONASE) 50 MCG/ACT nasal spray Place 1 spray into both nostrils daily as needed for allergies. 15.8 mL 3   lamoTRIgine (LAMICTAL) 200 MG tablet Take 200 mg by mouth daily.     levocetirizine (XYZAL) 5 MG tablet TAKE ONE TABLET BY MOUTH ONE TIME A DAY 30 tablet 6   levothyroxine (SYNTHROID) 125 MCG tablet Take 125 mcg by mouth at bedtime.     montelukast (SINGULAIR) 10 MG tablet TAKE ONE TABLET BY MOUTH AT BEDTIME 30 tablet 5   ONE TOUCH ULTRA TEST test strip Inject 1 strip into the skin daily.  3    oxyCODONE-acetaminophen (PERCOCET) 10-325 MG tablet Take 1 tablet by mouth 5 (five) times daily. As needed for pain     PROMETHEGAN 12.5 MG suppository Place 12.5 mg rectally 2 (two) times daily as needed for nausea or vomiting.     SYNVISC 16 MG/2ML SOSY      tiZANidine (ZANAFLEX) 4 MG tablet Take 4 mg by mouth every 6 (six) hours as needed for muscle spasms.     ARIPiprazole (ABILIFY) 15 MG tablet Take 15 mg by mouth daily. (Patient not taking: Reported on 07/11/2022)     azithromycin (ZITHROMAX) 250 MG tablet Take 2 tablets on first day, then 1 tablet daily until finished. (Patient not taking: Reported on 07/11/2022) 6 tablet 0   busPIRone (BUSPAR) 5 MG tablet Take 5 mg by mouth 3 (three) times daily. (Patient not taking: Reported on 07/11/2022)     doxepin (SINEQUAN) 10 MG capsule Take 25 mg by mouth at bedtime. (Patient not taking: Reported on 07/11/2022)     DULOXETINE HCL PO Take 120 mg by mouth daily at 6 (six) AM. (Patient not taking: Reported on 07/11/2022)     hydrocortisone cream 0.5 % Apply 1 application topically 2 (two) times daily. (Patient not taking: Reported on 07/11/2022)     metFORMIN (GLUCOPHAGE-XR) 500 MG 24 hr tablet Take 500 mg by mouth 2 (two) times daily. (Patient not taking: Reported on 07/11/2022)     NEOMYCIN-POLYMYXIN-HYDROCORTISONE (CORTISPORIN) 1 % SOLN OTIC solution Apply 1-2 drops to toe BID after soaking (Patient not taking: Reported on 07/11/2022) 10 mL 1   nitroGLYCERIN (NITROSTAT) 0.4 MG SL tablet Place 1 tablet (0.4 mg total) under the tongue every 5 (five) minutes as needed for chest pain. 25 tablet 3   nystatin (MYCOSTATIN) 100000 UNIT/ML suspension Take 5 mLs (500,000 Units total) by mouth 2 (two) times daily. (Patient not taking: Reported on 07/11/2022) 70 mL 0   OZEMPIC, 1 MG/DOSE, 4 MG/3ML SOPN Inject 2 mg into the skin once a week. Monday (Patient not taking: Reported on 07/11/2022)     budesonide-formoterol (SYMBICORT) 80-4.5 MCG/ACT inhaler Inhale 2 puffs into the lungs in  the morning and at bedtime. (Patient not taking: Reported on 07/11/2022) 10.2 g 5   No facility-administered medications prior to visit.      Review of Systems  Review of Systems  Constitutional: Negative.  Negative for fever.  HENT:  Positive for congestion and postnasal drip.   Respiratory:  Positive for cough, shortness of breath and wheezing.   Cardiovascular: Negative.    Physical Exam  BP (!) 146/78 (BP Location: Left Arm, Patient Position: Sitting, Cuff Size: Large)   Pulse 72   Temp 98 F (36.7 C) (Oral)   Ht '5\' 9"'$  (1.753 m)   Wt 267 lb 6.4 oz (121.3 kg)   SpO2 92%  BMI 39.49 kg/m  Physical Exam Constitutional:      Appearance: Normal appearance.  HENT:     Head: Normocephalic and atraumatic.  Cardiovascular:     Rate and Rhythm: Normal rate and regular rhythm.  Pulmonary:     Effort: Pulmonary effort is normal.     Breath sounds: Normal breath sounds. No wheezing, rhonchi or rales.     Comments: Distant rales left base Musculoskeletal:        General: Normal range of motion.  Skin:    General: Skin is warm and dry.  Neurological:     General: No focal deficit present.     Mental Status: She is alert and oriented to person, place, and time. Mental status is at baseline.  Psychiatric:        Mood and Affect: Mood normal.        Behavior: Behavior normal.        Thought Content: Thought content normal.        Judgment: Judgment normal.      Lab Results:  CBC    Component Value Date/Time   WBC 7.0 10/25/2020 0329   RBC 2.38 (L) 10/25/2020 0329   HGB 7.5 (L) 10/25/2020 0329   HCT 23.3 (L) 10/25/2020 0329   PLT 202 10/25/2020 0329   MCV 97.9 10/25/2020 0329   MCH 31.5 10/25/2020 0329   MCHC 32.2 10/25/2020 0329   RDW 15.4 10/25/2020 0329   LYMPHSABS 3.1 10/25/2020 0329   MONOABS 0.6 10/25/2020 0329   EOSABS 0.1 10/25/2020 0329   BASOSABS 0.0 10/25/2020 0329    BMET    Component Value Date/Time   NA 137 10/25/2020 0329   NA 139 02/26/2019  0841   K 3.6 10/25/2020 0329   CL 102 10/25/2020 0329   CO2 32 10/25/2020 0329   GLUCOSE 140 (H) 10/25/2020 0329   BUN <5 (L) 10/25/2020 0329   BUN 13 02/26/2019 0841   CREATININE 0.74 10/25/2020 0329   CALCIUM 8.1 (L) 10/25/2020 0329   GFRNONAA >60 10/25/2020 0329   GFRAA 102 02/26/2019 0841    BNP No results found for: "BNP"  ProBNP    Component Value Date/Time   PROBNP 23.0 08/07/2013 1015    Imaging: DG Chest 2 View  Result Date: 07/11/2022 CLINICAL DATA:  Asthmatic bronchitis with acute exacerbation. Possible rales left base. EXAM: CHEST - 2 VIEW COMPARISON:  12/06/2018. FINDINGS: No focal airspace opacity. Normal heart size and mediastinal contours. No pleural effusion or pneumothorax. IMPRESSION: No evidence of acute cardiopulmonary disease. Electronically Signed   By: Emmit Alexanders M.D.   On: 07/11/2022 16:12     Assessment & Plan:   Moderate persistent asthma with (acute) exacerbation - Pateint developed asthma symptoms 2-3 weeks ago with associated bronchitis. She was sent in Levaquin and prednisone by UC provider, I have told her it is ok to start taking medication. Not currently on mainteance inhaler d/t insurance coverage. Advised she restart BREO 251mg one puff daily. Needs CXR today d/t rales heard on exam left lower lobe. FU in 1 week.   Nocturnal hypoxia - Continue 2L oxygen at bedtime/ No daytime requirements   Allergic rhinitis - Resume flonase nasal spray daily  - Continue Xyzal '5mg'$  daily   EMartyn Ehrich NP 07/11/2022

## 2022-07-11 NOTE — Patient Instructions (Addendum)
Recommendation: Resume BREO 260mg one puff daily in the morning  Resume flonase nasal spray Continue Xyzal '5mg'$   Continue nocturnal oxygen 2L (goal spO2 >88-90%) Ok to start Levaquin and prednisone   Orders: CXR Ambulatory walk test on forehead probe  Follow-up 1 week with BEustaquio MaizeNP

## 2022-07-12 NOTE — Telephone Encounter (Signed)
NA

## 2022-07-18 ENCOUNTER — Encounter: Payer: Self-pay | Admitting: Primary Care

## 2022-07-18 ENCOUNTER — Ambulatory Visit (INDEPENDENT_AMBULATORY_CARE_PROVIDER_SITE_OTHER): Payer: PRIVATE HEALTH INSURANCE | Admitting: Primary Care

## 2022-07-18 VITALS — BP 128/68 | HR 74 | Temp 98.1°F | Ht 69.0 in | Wt 275.0 lb

## 2022-07-18 DIAGNOSIS — R059 Cough, unspecified: Secondary | ICD-10-CM | POA: Diagnosis not present

## 2022-07-18 DIAGNOSIS — R0602 Shortness of breath: Secondary | ICD-10-CM

## 2022-07-18 DIAGNOSIS — G4733 Obstructive sleep apnea (adult) (pediatric): Secondary | ICD-10-CM | POA: Diagnosis not present

## 2022-07-18 DIAGNOSIS — J301 Allergic rhinitis due to pollen: Secondary | ICD-10-CM

## 2022-07-18 DIAGNOSIS — J9611 Chronic respiratory failure with hypoxia: Secondary | ICD-10-CM | POA: Diagnosis not present

## 2022-07-18 DIAGNOSIS — J4541 Moderate persistent asthma with (acute) exacerbation: Secondary | ICD-10-CM

## 2022-07-18 DIAGNOSIS — B370 Candidal stomatitis: Secondary | ICD-10-CM | POA: Insufficient documentation

## 2022-07-18 DIAGNOSIS — G4734 Idiopathic sleep related nonobstructive alveolar hypoventilation: Secondary | ICD-10-CM

## 2022-07-18 DIAGNOSIS — J432 Centrilobular emphysema: Secondary | ICD-10-CM

## 2022-07-18 LAB — CBC WITH DIFFERENTIAL/PLATELET
Basophils Absolute: 0 10*3/uL (ref 0.0–0.1)
Basophils Relative: 0.3 % (ref 0.0–3.0)
Eosinophils Absolute: 0.2 10*3/uL (ref 0.0–0.7)
Eosinophils Relative: 2.3 % (ref 0.0–5.0)
HCT: 43.4 % (ref 36.0–46.0)
Hemoglobin: 14.4 g/dL (ref 12.0–15.0)
Lymphocytes Relative: 29.5 % (ref 12.0–46.0)
Lymphs Abs: 2.4 10*3/uL (ref 0.7–4.0)
MCHC: 33.2 g/dL (ref 30.0–36.0)
MCV: 95.9 fl (ref 78.0–100.0)
Monocytes Absolute: 0.8 10*3/uL (ref 0.1–1.0)
Monocytes Relative: 9.6 % (ref 3.0–12.0)
Neutro Abs: 4.7 10*3/uL (ref 1.4–7.7)
Neutrophils Relative %: 58.3 % (ref 43.0–77.0)
Platelets: 253 10*3/uL (ref 150.0–400.0)
RBC: 4.52 Mil/uL (ref 3.87–5.11)
RDW: 13.1 % (ref 11.5–15.5)
WBC: 8.1 10*3/uL (ref 4.0–10.5)

## 2022-07-18 LAB — BRAIN NATRIURETIC PEPTIDE: Pro B Natriuretic peptide (BNP): 83 pg/mL (ref 0.0–100.0)

## 2022-07-18 LAB — BASIC METABOLIC PANEL
BUN: 18 mg/dL (ref 6–23)
CO2: 34 mEq/L — ABNORMAL HIGH (ref 19–32)
Calcium: 9.5 mg/dL (ref 8.4–10.5)
Chloride: 95 mEq/L — ABNORMAL LOW (ref 96–112)
Creatinine, Ser: 0.79 mg/dL (ref 0.40–1.20)
GFR: 83.55 mL/min (ref >60.00)
Glucose, Bld: 411 mg/dL — ABNORMAL HIGH (ref 70–99)
Potassium: 4.5 mEq/L (ref 3.5–5.1)
Sodium: 134 mEq/L — ABNORMAL LOW (ref 135–145)

## 2022-07-18 LAB — POCT EXHALED NITRIC OXIDE: FeNO level (ppb): 11

## 2022-07-18 MED ORDER — NYSTATIN 100000 UNIT/ML MT SUSP: 5.0000 mL | Freq: Two times a day (BID) | OROMUCOSAL | 1 refills | Status: DC

## 2022-07-18 NOTE — Assessment & Plan Note (Signed)
-   RX nystain 5ML QID prn oral thrush symptoms

## 2022-07-18 NOTE — Assessment & Plan Note (Addendum)
-   Former smoker. Paraseptal and centrilobular emphysema on CTA. Diffusion capacity was normal on PFTs from 2019. Needs updated pulmonary function testing.

## 2022-07-18 NOTE — Assessment & Plan Note (Addendum)
-   Slowly improving from recent asthma exacerbation. Completed course of oral Levaquin and prednisone. Cough has resolved, mild improvement in dyspnea symptoms. FENO was normal today. CXR earlier this month without acute process, no pneumonia or cardiomegaly. Checking labs today to ensure no other cause of shortness of breath. Continue BREO 229mcg one puff daily. No additional steroids needed today. Consider adding Spiriva Respimat if not better d.t emphysema.

## 2022-07-18 NOTE — Patient Instructions (Addendum)
Slowly improving from recent asthma exacerbation  FENO was normal today indicating that your asthma is controlled on present treatment or you do not have an allergic phenotype  CXR this month without acute process, no pneumonia or cardiomegaly   Checking labs today to ensure no other cause of shortness of breath  Recommendations: Continue BREO 1 puff daily in the morning ( rinse mouth after use) Take nystatin s/s as directed for thursh Continue supplemental oxygen at bedtime/ prn daytime to maintain O2 >88-90%   Orders: PFTs first available  FENO- asthma (done) Labs today (ordered) Walks test to qualify for POC   Follow-up: 1-2 months with PFTs prior with Dr. Elsworth Soho in [place] (if patient does not want to go to new location she needs to establish with any [facility] pulmonary MD for asthma/emphysema-30 min slot)

## 2022-07-18 NOTE — Progress Notes (Signed)
$'@Patient'Q$  ID: Veronica Rivers, female    DOB: [birth year], 56 y.o.   MRN: [id number]  Chief Complaint  Patient presents with   Follow-up    Oxygen sats low both with exertion and rest.  Completed Levaquin and prednisone    Referring provider: Kristen Loader, FNP  HPI: 57 year old female, former smoker quit 1999.  Past medical history significant for asthma, emphysema, allergic rhinitis, hypertension, obstructive sleep apnea, type 2 diabetes, bipolar depression.  Patient of Dr. Elsworth Soho, last seen in office on 08/11/2021.  Previous [facility] pulmonary encounter:  07/11/2022 Patient presents today for acute office visit.   She developed cough with associated wheezing x 2 weeks   Increased shortness of breath over the last 3 days. Cough is productive, thick purulent mucus.  She has been out of Symbicort 39mg since January 2024 d/t insurance coverage She has an old prescription for BREO 2040m at home  She has been requiring Albuterol 2-3 times a day  She takes Xyzal 5 mg daily Not currently using Flonase nasal spray She wears 2 L of oxygen at bedtime   07/18/2022- Interim hx  Patient presents today for 2 week follow-up/asthma exacerbation. She developed asthmatic bronchitis symptoms in mid February. Given Levaquin and advised she restart BREO. She is on 2L oxygen at beditme. No daytime requirement.   Accompanied by female family member. Some improvement in her cough. No longer productive. She completed Levaquin and steroids. She finished prednisone last week. Breathing wise she feels about the same, maybe slightly better. She has been using Breo daily. Requires Albuterol rescue inhaler on average once a day. She wears oxygen at bedtime. She has some trace BLE edema, pitting. Echocardiogram in 2019 that showed normal LV function and mild diastolic dysfunction.    Allergies  Allergen Reactions   Omeprazole     Made her breathing worse.   Canagliflozin     Other reaction(s): yeast infections    Cyclobenzaprine     Other reaction(s): crazy in head   Dulaglutide     Other reaction(s): nausea and vomiting   Empagliflozin     Other reaction(s): yeast infections   Ezetimibe Other (See Comments)   Meloxicam Other (See Comments)    Pt stated, "Got Burning, Acid Reflux with medicine" Pt should not be on any anti-inflammatories d/t elevated LFTs   Ozempic (0.25 Or 0.5 Mg-Dose) [Semaglutide(0.25 Or 0.'5mg'$ -Dos)] Nausea And Vomiting   Valtrex [Valacyclovir]     Other reaction(s): agitation   Zocor [Simvastatin] Other (See Comments)    Caused muscle pain   Sulfa Antibiotics Rash   Sulfa Drugs Cross Reactors Rash    Immunization History  Administered Date(s) Administered   Influenza Split 02/19/2009, 05/04/2017, 02/05/2019   Influenza, Quadrivalent, Recombinant, Inj, Pf 02/05/2019   Influenza,inj,Quad PF,6+ Mos 05/04/2017, 02/07/2018   Influenza,inj,quad, With Preservative 01/20/2014, 02/24/2015   Influenza-Unspecified 02/17/2018   PFIZER(Purple Top)SARS-COV-2 Vaccination 07/11/2019, 08/10/2019, 03/05/2020   Pneumococcal Polysaccharide-23 08/21/2008, 07/16/2013   Tdap 11/10/2012, 08/11/2014    Past Medical History:  Diagnosis Date   Asthma    Bipolar depression ([facility]   Cellulitis    CHEST PAIN    DM    DYSPNEA    Emphysema of lung ([facility]   HYPERCHOLESTEROLEMIA    HYPERLIPIDEMIA    HYPOTHYROIDISM    Neuropathy    SUPRAVENTRICULAR TACHYCARDIA     Tobacco History: Social History   Tobacco Use  Smoking Status Former   Years: 15.00   Types: Cigarettes   Quit date:  11/23/1997   Years since quitting: 24.6  Smokeless Tobacco Never   Counseling given: Not Answered   Outpatient Medications Prior to Visit  Medication Sig Dispense Refill   albuterol (VENTOLIN HFA) 108 (90 Base) MCG/ACT inhaler Inhale 1-2 puffs into the lungs every 4 (four) hours as needed for wheezing or shortness of breath. 18 g 3   ARIPiprazole (ABILIFY) 15 MG tablet Take 15 mg by mouth daily.      busPIRone (BUSPAR) 5 MG tablet Take 5 mg by mouth 3 (three) times daily.     carvedilol (COREG) 12.5 MG tablet Take 1 tablet (12.5 mg total) by mouth 2 (two) times daily with a meal. 180 tablet 3   Cholecalciferol (VITAMIN D3) 125 MCG (5000 UT) CAPS Take 5,000 Units by mouth daily.     diclofenac Sodium (VOLTAREN) 1 % GEL Apply 2 g topically 4 (four) times daily as needed (knee pain).     doxepin (SINEQUAN) 10 MG capsule Take 25 mg by mouth at bedtime.     DULOXETINE HCL PO Take 120 mg by mouth daily at 6 (six) AM.     fluticasone (FLONASE) 50 MCG/ACT nasal spray Place 1 spray into both nostrils daily as needed for allergies. 15.8 mL 3   fluticasone furoate-vilanterol (BREO ELLIPTA) 200-25 MCG/ACT AEPB Inhale 1 puff into the lungs daily. 60 each 1   hydrocortisone cream 0.5 % Apply 1 application  topically 2 (two) times daily.     lamoTRIgine (LAMICTAL) 200 MG tablet Take 200 mg by mouth daily.     levocetirizine (XYZAL) 5 MG tablet TAKE ONE TABLET BY MOUTH ONE TIME A DAY 30 tablet 6   levothyroxine (SYNTHROID) 125 MCG tablet Take 125 mcg by mouth at bedtime.     metFORMIN (GLUCOPHAGE-XR) 500 MG 24 hr tablet Take 500 mg by mouth 2 (two) times daily.     montelukast (SINGULAIR) 10 MG tablet TAKE ONE TABLET BY MOUTH AT BEDTIME 30 tablet 5   NEOMYCIN-POLYMYXIN-HYDROCORTISONE (CORTISPORIN) 1 % SOLN OTIC solution Apply 1-2 drops to toe BID after soaking 10 mL 1   ONE TOUCH ULTRA TEST test strip Inject 1 strip into the skin daily.  3   oxyCODONE-acetaminophen (PERCOCET) 10-325 MG tablet Take 1 tablet by mouth 5 (five) times daily. As needed for pain     OZEMPIC, 1 MG/DOSE, 4 MG/3ML SOPN Inject 2 mg into the skin once a week. Monday     PROMETHEGAN 12.5 MG suppository Place 12.5 mg rectally 2 (two) times daily as needed for nausea or vomiting.     SYNVISC 16 MG/2ML SOSY      tiZANidine (ZANAFLEX) 4 MG tablet Take 4 mg by mouth every 6 (six) hours as needed for muscle spasms.     nystatin (MYCOSTATIN)  100000 UNIT/ML suspension Take 5 mLs (500,000 Units total) by mouth 2 (two) times daily. 70 mL 0   azithromycin (ZITHROMAX) 250 MG tablet Take 2 tablets on first day, then 1 tablet daily until finished. (Patient not taking: Reported on 07/18/2022) 6 tablet 0   nitroGLYCERIN (NITROSTAT) 0.4 MG SL tablet Place 1 tablet (0.4 mg total) under the tongue every 5 (five) minutes as needed for chest pain. 25 tablet 3   No facility-administered medications prior to visit.      Review of Systems  Review of Systems  Constitutional: Negative.   HENT: Negative.    Respiratory:  Positive for shortness of breath. Negative for cough, chest tightness and wheezing.   Cardiovascular:  Positive for  leg swelling.   Physical Exam  BP 128/68 (BP Location: Right Arm, Patient Position: Sitting, Cuff Size: Large)   Pulse 74   Temp 98.1 F (36.7 C) (Oral)   Ht '5\' 9"'$  (1.753 m)   Wt 275 lb (124.7 kg)   SpO2 91% Comment: room air  BMI 40.61 kg/m  Physical Exam Constitutional:      General: She is not in acute distress.    Appearance: Normal appearance. She is not ill-appearing or toxic-appearing.  HENT:     Head: Normocephalic and atraumatic.     Mouth/Throat:     Mouth: Mucous membranes are moist.     Pharynx: Oropharynx is clear.     Comments: Yellow film to tongue  Cardiovascular:     Rate and Rhythm: Normal rate and regular rhythm.     Comments: Mild pitting edema L>R Pulmonary:     Effort: Pulmonary effort is normal.     Breath sounds: Normal breath sounds. No wheezing.     Comments: No overt wheezing  Neurological:     General: No focal deficit present.     Mental Status: She is alert and oriented to person, place, and time. Mental status is at baseline.  Psychiatric:        Mood and Affect: Mood normal.        Behavior: Behavior normal.        Thought Content: Thought content normal.        Judgment: Judgment normal.      Lab Results:  CBC    Component Value Date/Time   WBC 7.0  10/25/2020 0329   RBC 2.38 (L) 10/25/2020 0329   HGB 7.5 (L) 10/25/2020 0329   HCT 23.3 (L) 10/25/2020 0329   PLT 202 10/25/2020 0329   MCV 97.9 10/25/2020 0329   MCH 31.5 10/25/2020 0329   MCHC 32.2 10/25/2020 0329   RDW 15.4 10/25/2020 0329   LYMPHSABS 3.1 10/25/2020 0329   MONOABS 0.6 10/25/2020 0329   EOSABS 0.1 10/25/2020 0329   BASOSABS 0.0 10/25/2020 0329    BMET    Component Value Date/Time   NA 137 10/25/2020 0329   NA 139 02/26/2019 0841   K 3.6 10/25/2020 0329   CL 102 10/25/2020 0329   CO2 32 10/25/2020 0329   GLUCOSE 140 (H) 10/25/2020 0329   BUN <5 (L) 10/25/2020 0329   BUN 13 02/26/2019 0841   CREATININE 0.74 10/25/2020 0329   CALCIUM 8.1 (L) 10/25/2020 0329   GFRNONAA >60 10/25/2020 0329   GFRAA 102 02/26/2019 0841    BNP No results found for: "BNP"  ProBNP    Component Value Date/Time   PROBNP 23.0 08/07/2013 1015    Imaging: DG Chest 2 View  Result Date: 07/11/2022 CLINICAL DATA:  Asthmatic bronchitis with acute exacerbation. Possible rales left base. EXAM: CHEST - 2 VIEW COMPARISON:  12/06/2018. FINDINGS: No focal airspace opacity. Normal heart size and mediastinal contours. No pleural effusion or pneumothorax. IMPRESSION: No evidence of acute cardiopulmonary disease. Electronically Signed   By: Emmit Alexanders M.D.   On: 07/11/2022 16:12     Assessment & Plan:   Moderate persistent asthma with (acute) exacerbation - Slowly improving from recent asthma exacerbation. Completed course of oral Levaquin and prednisone. Cough has resolved, mild improvement in dyspnea symptoms. FENO was normal today. CXR earlier this month without acute process, no pneumonia or cardiomegaly. Checking labs today to ensure no other cause of shortness of breath. Continue BREO 23mg one puff daily. No  additional steroids needed today. Consider adding Spiriva Respimat if not better d.t emphysema.  OSA (obstructive sleep apnea) - Very mild not requiring CPAP  Nocturnal  hypoxia - Patient uses 2L oxygen at night; she has new oxygen requirements with moderate exertion. O2 desaturated to 88% RA, recovered to 94-97% with 2L. Patient has home concentrator. DME order placed for portable oxygen concentrator.  Centrilobular emphysema ([place]) - Former smoker. Paraseptal and centrilobular emphysema on CTA. Diffusion capacity was normal on PFTs from 2019. Needs updated pulmonary function testing.   Oral thrush - RX nystain 5ML QID prn oral thrush symptoms   Allergic rhinitis - Stable; Continue Xyzal '5mg'$  daily and fluticasone nasal spray    Martyn Ehrich, NP 07/18/2022

## 2022-07-18 NOTE — Progress Notes (Signed)
Please let patient know her glucose was pretty elevated on her labs (it was 411), make sure she is taking her metformin. She needs to follow up with PCP tomorrow regarding this. If she is having any changes in MS should go to ED

## 2022-07-18 NOTE — Assessment & Plan Note (Signed)
-   Stable; Continue Xyzal 5mg  daily and fluticasone nasal spray

## 2022-07-18 NOTE — Assessment & Plan Note (Signed)
-   Very mild not requiring CPAP

## 2022-07-18 NOTE — Assessment & Plan Note (Addendum)
-   Patient uses 2L oxygen at night; she has new oxygen requirements with moderate exertion. O2 desaturated to 88% RA, recovered to 94-97% with 2L. Patient has home concentrator. DME order placed for portable oxygen concentrator.

## 2022-07-22 ENCOUNTER — Telehealth: Payer: Self-pay | Admitting: Primary Care

## 2022-07-22 DIAGNOSIS — J9611 Chronic respiratory failure with hypoxia: Secondary | ICD-10-CM

## 2022-07-22 NOTE — Telephone Encounter (Signed)
Walk test is located in patient's office visit. Updated order has been placed.

## 2022-07-22 NOTE — Telephone Encounter (Signed)
[name] states needs updated order and walk test for patient's POC. [name] phone number is [phone].

## 2022-07-25 NOTE — Addendum Note (Signed)
Addended by: Meredith Staggers A on: 07/25/2022 02:04 PM   Modules accepted: Orders

## 2022-07-26 ENCOUNTER — Telehealth: Payer: Self-pay | Admitting: Primary Care

## 2022-07-26 NOTE — Telephone Encounter (Signed)
Patient has office visit coming up and can be walked with POC at office visit. Nothing further needed

## 2022-07-26 NOTE — Telephone Encounter (Signed)
[name] states needs walk test results with POC. [name] phone number is [phone].

## 2022-08-01 ENCOUNTER — Telehealth: Payer: Self-pay | Admitting: Primary Care

## 2022-08-01 ENCOUNTER — Other Ambulatory Visit: Payer: Self-pay | Admitting: Pulmonary Disease

## 2022-08-01 DIAGNOSIS — J4530 Mild persistent asthma, uncomplicated: Secondary | ICD-10-CM

## 2022-08-01 NOTE — Telephone Encounter (Signed)
Patient would like to switch DME companies. Not able to get oxygen tanks thru [place]. Patient phone number is [phone].

## 2022-08-02 NOTE — Telephone Encounter (Signed)
Called pt and she was unavailable  Left msg with female who answered thew phone to have her call back  Will await return call

## 2022-10-04 ENCOUNTER — Encounter (HOSPITAL_BASED_OUTPATIENT_CLINIC_OR_DEPARTMENT_OTHER): Payer: Self-pay

## 2022-10-06 ENCOUNTER — Ambulatory Visit (HOSPITAL_BASED_OUTPATIENT_CLINIC_OR_DEPARTMENT_OTHER): Payer: PRIVATE HEALTH INSURANCE | Admitting: Pulmonary Disease

## 2022-10-06 ENCOUNTER — Encounter (HOSPITAL_BASED_OUTPATIENT_CLINIC_OR_DEPARTMENT_OTHER): Payer: PRIVATE HEALTH INSURANCE

## 2022-10-11 ENCOUNTER — Encounter (HOSPITAL_BASED_OUTPATIENT_CLINIC_OR_DEPARTMENT_OTHER): Payer: Self-pay

## 2022-10-11 NOTE — Telephone Encounter (Signed)
Yes we can fill out handicap form and use oxygen needs as reason

## 2022-10-11 NOTE — Telephone Encounter (Signed)
Mychart message sent by pt:  Veronica Rivers [facility] (supporting Veronica Bayley, NP)3 hours ago (1:25 PM)    Hi, I wanted to check and see if I qualify for a handicap sticker since I am on portable oxygen. I have found that I have a harder time in the heat. Thank you, Veronica Rivers     Beth, please advise.

## 2022-11-14 ENCOUNTER — Telehealth: Payer: Self-pay | Admitting: Primary Care

## 2022-11-14 ENCOUNTER — Telehealth (HOSPITAL_BASED_OUTPATIENT_CLINIC_OR_DEPARTMENT_OTHER): Payer: No Typology Code available for payment source | Admitting: Primary Care

## 2022-11-14 ENCOUNTER — Ambulatory Visit (INDEPENDENT_AMBULATORY_CARE_PROVIDER_SITE_OTHER): Payer: No Typology Code available for payment source | Admitting: Pulmonary Disease

## 2022-11-14 DIAGNOSIS — J4541 Moderate persistent asthma with (acute) exacerbation: Secondary | ICD-10-CM

## 2022-11-14 DIAGNOSIS — J9601 Acute respiratory failure with hypoxia: Secondary | ICD-10-CM

## 2022-11-14 DIAGNOSIS — J4530 Mild persistent asthma, uncomplicated: Secondary | ICD-10-CM | POA: Diagnosis not present

## 2022-11-14 DIAGNOSIS — J432 Centrilobular emphysema: Secondary | ICD-10-CM

## 2022-11-14 DIAGNOSIS — G4734 Idiopathic sleep related nonobstructive alveolar hypoventilation: Secondary | ICD-10-CM

## 2022-11-14 LAB — PULMONARY FUNCTION TEST
DL/VA % pred: 122 %
DL/VA: 5.01 ml/min/mmHg/L
DLCO cor % pred: 88 %
DLCO cor: 21.81 ml/min/mmHg
DLCO unc % pred: 88 %
DLCO unc: 21.81 ml/min/mmHg
FEF 25-75 Post: 3.43 L/sec
FEF 25-75 Pre: 2.73 L/sec
FEF2575-%Change-Post: 25 %
FEF2575-%Pred-Post: 120 %
FEF2575-%Pred-Pre: 95 %
FEV1-%Change-Post: 5 %
FEV1-%Pred-Post: 76 %
FEV1-%Pred-Pre: 72 %
FEV1-Post: 2.45 L
FEV1-Pre: 2.31 L
FEV1FVC-%Change-Post: 2 %
FEV1FVC-%Pred-Pre: 105 %
FEV6-%Change-Post: 3 %
FEV6-%Pred-Post: 72 %
FEV6-%Pred-Pre: 69 %
FEV6-Post: 2.88 L
FEV6-Pre: 2.77 L
FEV6FVC-%Pred-Post: 103 %
FEV6FVC-%Pred-Pre: 103 %
FVC-%Change-Post: 3 %
FVC-%Pred-Post: 70 %
FVC-%Pred-Pre: 67 %
FVC-Post: 2.88 L
FVC-Pre: 2.78 L
Post FEV1/FVC ratio: 85 %
Post FEV6/FVC ratio: 100 %
Pre FEV1/FVC ratio: 83 %
Pre FEV6/FVC Ratio: 100 %
RV % pred: 86 %
RV: 1.89 L
TLC % pred: 88 %
TLC: 5.18 L

## 2022-11-14 NOTE — Patient Instructions (Signed)
Full PFT performed today. °

## 2022-11-14 NOTE — Progress Notes (Signed)
Virtual Visit via Video Note  I connected with Veronica Rivers on 11/14/22 at  3:30 PM EDT by a video enabled telemedicine application and verified that I am speaking with the correct person using two identifiers.  Location: Patient: Home Provider: Office    I discussed the limitations of evaluation and management by telemedicine and the availability of in person appointments. The patient expressed understanding and agreed to proceed.  History of Present Illness: 57 year old female, former smoker quit 1999.  Past medical history significant for asthma, emphysema, allergic rhinitis, hypertension, obstructive sleep apnea, type 2 diabetes, bipolar depression.  Patient of Dr. [name].  Previous [facility] pulmonary encounter:  07/11/2022 Patient presents today for acute office visit.   She developed cough with associated wheezing x 2 weeks   Increased shortness of breath over the last 3 days. Cough is productive, thick purulent mucus.  She has been out of Symbicort since January 2024 d/t insurance coverage She has an old prescription for BREO at home  She has been requiring Albuterol 2-3 times a day  She takes Xyzal 5 mg daily Not currently using Flonase nasal spray She wears 2 L of oxygen at bedtime  07/18/2022 Patient presents today for 2 week follow-up/asthma exacerbation. She developed asthmatic bronchitis symptoms in mid February. Given Levaquin and advised she restart BREO. She is on 2L oxygen at beditme. No daytime requirement.   Accompanied by female family member. Some improvement in her cough. No longer productive. She completed Levaquin and steroids. She finished prednisone last week. Breathing wise she feels about the same, maybe slightly better. She has been using Breo daily. Requires Albuterol rescue inhaler on average once a day. She wears oxygen at bedtime. She has some trace BLE edema, pitting. Echocardiogram in 2019 that showed normal LV function and mild diastolic dysfunction.    11/14/2022- Interim hx  Patient contacted today for 3-4 month follow-up/review PFTs. She follows with Dr. [name] for moderate persistent asthma, mild OSA, emphysema and nocturnal hypoxia. Her last asthma exacerbation was in March, she was treated with Levaquin, prednisone and advised to restart BREO Ellipta. She routinely wears 2L oxygen a bedtime but more recently has been needing to use during the day. She reports O2 dropping into mid 80s-low 90s at home. She is not particularly symptomatic. Denies overt shortness of breath, chest tightness, cough or wheezing symptoms. She did not tolerate BREO due to nausea and stopped using. She feels like her weight is contributing a lot to her symptoms. Current weight 271 lbs.   Observations/Objective:  Appears well; Alert and oriented. No acute respiratory distress. Wearing oxygen  PFT 11/14/2022 >> FVC 2.88 (70%), FEV1 2.45 (76%), ratio 85, DLCO 21.81 (88%)   Assessment and Plan: 57 year old female, former smoker. Medical history significant for mild persistent asthma, nocturnal hypoxia, mild OSA not on CPAP and emphysema. She wears oxygen at night. She has been requiring daytime oxygen which is new. She reports O2 levels in the mid 80s to low 90s at home.  During her last office visit her oxygen was 91% on room air. She is not overtly symptomatic.  She does not complain of shortness of breath, cough, wheezing or chest tightness. She has paraseptal and centrilobular emphysema/ bullous changes at apices on CTA imaging from 2020. Pulmonary function testing 11/14/2022 showed mild restriction with normal diffusion capacity. Her last echocardiogram in 2019 showed mild diastolic dysfunction. BNP was normal in March. She did not tolerate BREO due to nausea, we will trial  spiriva respimat 1.22mcg for asthma. Considering getting updated CT chest and echocardiogram along with D-dimer.   Mild persistent asthma: - PFTs 11/14/2022 >> FEV1 2.45 (76%), ratio 85, DLCO 21.81 (88%) /  mild-moderate restriction with normal diffusion capacity - She did not tolerate BREO due to nausea/vomiting  - Trial Spiriva Respimat 1.65mcg two puff daily   Seasonal allergies:  - Stable; Continue Singulair and xyzal   Emphysema: - CTA imaging in 2020 showed paraseptal and centriloubuar emphysema within apices/ Bullous changes in the apices. CXR in March 2024 showed no acute process. PFTs today with mild restriction and normal diffusion capacity.   Mild OSA:   - She did not require CPAP, AHI 7/hour. Encourage weight loss and positional sleep therapy  - Discussed repeating sleep study but patient declined as she likely would not tolerate CPAP   Nocturnal hypoxia - Continue wearing 2L oxygen at bedtime and prn to maintain O2 >88%   Follow Up Instructions:  1-2 months/as needed    I discussed the assessment and treatment plan with the patient. The patient was provided an opportunity to ask questions and all were answered. The patient agreed with the plan and demonstrated an understanding of the instructions.   The patient was advised to call back or seek an in-person evaluation if the symptoms worsen or if the condition fails to improve as anticipated.  I provided 40 minutes of non-face-to-face time during this encounter.   Glenford Bayley, NP

## 2022-11-14 NOTE — Progress Notes (Signed)
Full PFT performed today. °

## 2022-11-14 NOTE — Telephone Encounter (Signed)
Per request of Ames Dura, NP,  left sample Spiriva Respimat 1.25 mcg at front desk for patient.  Sig:  2 puffs daily.  #1 box. Left message on patients VM notifying patient sample is at front desk in [facility]. [employer] location.

## 2022-11-14 NOTE — Telephone Encounter (Signed)
Please leave sample Spiriva Respimat 1.44mcg two puffs daily up front for patient

## 2022-11-16 NOTE — Progress Notes (Signed)
Spoke with Dr. [name] whom is recommending patient get  CT angio chest to r/o PE  Echo with bubble study

## 2022-11-16 NOTE — Addendum Note (Signed)
Addended by: Glenford Bayley on: 11/16/2022 03:33 PM   Modules accepted: Orders

## 2022-11-22 ENCOUNTER — Encounter (HOSPITAL_BASED_OUTPATIENT_CLINIC_OR_DEPARTMENT_OTHER): Payer: Self-pay | Admitting: Primary Care

## 2022-11-24 ENCOUNTER — Telehealth (HOSPITAL_BASED_OUTPATIENT_CLINIC_OR_DEPARTMENT_OTHER): Payer: Self-pay | Admitting: Pulmonary Disease

## 2022-11-24 NOTE — Telephone Encounter (Signed)
Kendal Hymen with [facility] asking if patient needs a prior auth for 7/22 appointment. Needs by tomorrow by 2pm. Routing as high priority since time sensitive.

## 2022-11-25 NOTE — Telephone Encounter (Signed)
[employer]. Called had to have auth in by 2:00pm and they are canceling the apt

## 2022-11-28 ENCOUNTER — Other Ambulatory Visit: Payer: No Typology Code available for payment source

## 2022-11-30 ENCOUNTER — Ambulatory Visit
Admission: RE | Admit: 2022-11-30 | Discharge: 2022-11-30 | Disposition: A | Discharge status: Home / Self Care | Payer: No Typology Code available for payment source | Source: Ambulatory Visit | Attending: Primary Care | Admitting: Primary Care

## 2022-11-30 DIAGNOSIS — J9601 Acute respiratory failure with hypoxia: Secondary | ICD-10-CM

## 2022-11-30 DIAGNOSIS — G4734 Idiopathic sleep related nonobstructive alveolar hypoventilation: Secondary | ICD-10-CM

## 2022-11-30 MED ORDER — IOPAMIDOL (ISOVUE-370) INJECTION 76%
75.0000 mL | Freq: Once | INTRAVENOUS | Status: AC | PRN
  Administered 2022-11-30: 75 mL via INTRAVENOUS

## 2022-12-02 ENCOUNTER — Telehealth: Payer: Self-pay | Admitting: Primary Care

## 2022-12-02 MED ORDER — AMOXICILLIN-POT CLAVULANATE 875-125 MG PO TABS: 1.0000 | ORAL_TABLET | Freq: Two times a day (BID) | ORAL | 0 refills | Status: DC

## 2022-12-02 NOTE — Telephone Encounter (Signed)
She had some ground glass changes on imaging, I will send in abx since she is having respiratory symptoms

## 2022-12-02 NOTE — Telephone Encounter (Signed)
Informed pt abx was sent to pharmacy for her. Nothing further needed.

## 2022-12-02 NOTE — Telephone Encounter (Signed)
Pt stated she thinks she has a sinus infection that has moved into her chest and now she is coughing. But no sob. She is using her albuterol inhaler and spiriva inhaler. Pt denies coughing up mucus. She also tested neg for Covid. I informed pt I will send Buelah Manis, NP a message and give her a call back. Pt verbalized understanding

## 2022-12-02 NOTE — Telephone Encounter (Signed)
Pt was told by Ms. Clent Ridges to call if she has signs of an resp infection because of something showing on her angiogram. She is showing signs.  Please send RX for Antibx  Ph[facility]: [facility] [address]

## 2022-12-07 NOTE — Progress Notes (Signed)
Date:  12/12/2022    Veronica Rivers Date of Birth: 06-01-1965 Medical Record #[id number]  PCP:  Soundra Pilon, FNP  Cardiologist:  Eden Emms    HPI: Veronica Rivers is a 57 y.o. female  with a history of HLD, SVT, obesity, COPD and type 2 DM. No known CAD. Echo in 2019 with normal EF. In the hospital July 2020 with atypical chest pain - felt to be GI related. Has had costochondritis in the past also. Was to have stress testing - this was never completed - she cancelled along with her follow up here.   Her dyspnea is with and without exertion. Her chest pain comes and goes - with and without exertion - some palpable pain but also not. Says it is no different from her hospitalization back in July. She is now on insulin. Her weight continues to climb. She is a former smoker.   Seen by NP 02/25/19 to clear for EGD/colonoscopy Myovue 03/05/19 normal EF 63% Cleared to have endoscopy with Dr [facility] Had infection in esophagus and Rx with improvement    She sees Dr [name] for COPD 25 pack year quit in 1999 CT with apical  Emphysema  Rx with Breo did not tolerate. worse with seasonal allergies CTA 11/30/22 no PE patchy ground glass attenuation in upper lobes She wears oxygen at night   Admitted 10/25/20 with rectal bleeding 2 days after colonoscopy and polypectomy Hb 7.7 Transfused 2 units and clipping of bleeders in hepatic flexure Appears that coreg was held on d/c   Lost 15 lbs with all of the hospital issues   Has a painful right fifth metatarsal joint with steroid injection and debridement of callous Dr Al Corpus  Going through some depression/anxiety Has a long term therapist and now on Buspar Been bad since October 2023  Husband and daughter helpful   TTE 12/12/2022 negative bubble study EF 65-70% mild MR   Past Medical History:  Diagnosis Date   Asthma    Bipolar depression (HCC)    Cellulitis    CHEST PAIN    DM    DYSPNEA    Emphysema of lung (HCC)     HYPERCHOLESTEROLEMIA    HYPERLIPIDEMIA    HYPOTHYROIDISM    Neuropathy    SUPRAVENTRICULAR TACHYCARDIA     Past Surgical History:  Procedure Laterality Date   CESAREAN SECTION     COLONOSCOPY N/A 10/24/2020   Procedure: COLONOSCOPY;  Surgeon: Kerin Salen, MD;  Location: [facility];  Service: Gastroenterology;  Laterality: N/A;   FOOT SURGERY     HEMOSTASIS CLIP PLACEMENT  10/24/2020   Procedure: HEMOSTASIS CLIP PLACEMENT;  Surgeon: Kerin Salen, MD;  Location: [facility];  Service: Gastroenterology;;   NASAL SEPTUM SURGERY     NECK SURGERY     TONSILLECTOMY     TUBAL LIGATION       Medications: Current Meds  Medication Sig   albuterol (VENTOLIN HFA) 108 (90 Base) MCG/ACT inhaler Inhale 1-2 puffs into the lungs every 4 (four) hours as needed for wheezing or shortness of breath.   ARIPiprazole (ABILIFY) 15 MG tablet Take 15 mg by mouth daily.   carvedilol (COREG) 12.5 MG tablet Take 1 tablet (12.5 mg total) by mouth 2 (two) times daily with a meal.   Cholecalciferol (VITAMIN D3) 125 MCG (5000 UT) CAPS Take 5,000 Units by mouth daily.   diclofenac Sodium (VOLTAREN) 1 % GEL Apply 2 g topically 4 (four) times daily as needed (  knee pain).   DULOXETINE HCL PO Take 120 mg by mouth daily at 6 (six) AM.   famotidine (PEPCID) 20 MG tablet Take 20 mg by mouth daily.   fluticasone (FLONASE) 50 MCG/ACT nasal spray Place 1 spray into both nostrils daily as needed for allergies.   furosemide (LASIX) 20 MG tablet Take 1 tablet (20 mg total) by mouth daily.   gabapentin (NEURONTIN) 300 MG capsule Take 300 mg by mouth 2 (two) times daily.   hydrocortisone cream 0.5 % Apply 1 application  topically 2 (two) times daily.   lamoTRIgine (LAMICTAL) 200 MG tablet Take 200 mg by mouth daily.   levocetirizine (XYZAL) 5 MG tablet TAKE ONE TABLET BY MOUTH ONE TIME A DAY   levothyroxine (SYNTHROID) 125 MCG tablet Take 125 mcg by mouth at bedtime.   montelukast (SINGULAIR) 10 MG tablet TAKE 1 TABLET BY MOUTH  AT BEDTIME   NEOMYCIN-POLYMYXIN-HYDROCORTISONE (CORTISPORIN) 1 % SOLN OTIC solution Apply 1-2 drops to toe BID after soaking   nystatin (MYCOSTATIN) 100000 UNIT/ML suspension Take 5 mLs (500,000 Units total) by mouth 2 (two) times daily.   ONE TOUCH ULTRA TEST test strip Inject 1 strip into the skin daily.   oxyCODONE-acetaminophen (PERCOCET) 10-325 MG tablet Take 1 tablet by mouth 5 (five) times daily. As needed for pain   PROMETHEGAN 12.5 MG suppository Place 12.5 mg rectally 2 (two) times daily as needed for nausea or vomiting.   SYNVISC 16 MG/2ML SOSY      Allergies: Allergies  Allergen Reactions   Omeprazole     Made her breathing worse.   Canagliflozin     Other reaction(s): yeast infections   Cyclobenzaprine     Other reaction(s): crazy in head   Dulaglutide     Other reaction(s): nausea and vomiting   Empagliflozin     Other reaction(s): yeast infections   Ezetimibe Other (See Comments)   Meloxicam Other (See Comments)    Pt stated, "Got Burning, Acid Reflux with medicine" Pt should not be on any anti-inflammatories d/t elevated LFTs   Ozempic (0.25 Or 0.5 Mg-Dose) [Semaglutide(0.25 Or 0.5mg -Dos)] Nausea And Vomiting   Valtrex [Valacyclovir]     Other reaction(s): agitation   Zocor [Simvastatin] Other (See Comments)    Caused muscle pain   Sulfa Antibiotics Rash   Sulfa Drugs Cross Reactors Rash    Social History: The patient  reports that she quit smoking about 25 years ago. Her smoking use included cigarettes. She started smoking about 40 years ago. She has never used smokeless tobacco. She reports that she does not drink alcohol and does not use drugs.   Family History: The patient's family history includes COPD in her mother; Cancer in her father; Heart attack in her mother; Heart disease in her mother; Hypertension in her mother.   Review of Systems: Please see the history of present illness.   All other systems are reviewed and negative.   Physical  Exam: VS:  BP 110/60   Pulse 69   Ht 5\' 9"  (1.753 m)   Wt 270 lb 6.4 oz (122.7 kg)   SpO2 96%   BMI 39.93 kg/m  .  BMI Body mass index is 39.93 kg/m.  Wt Readings from Last 3 Encounters:  12/12/22 270 lb 6.4 oz (122.7 kg)  07/18/22 275 lb (124.7 kg)  07/11/22 267 lb 6.4 oz (121.3 kg)    Affect appropriate Healthy:  appears stated age HEENT: normal Neck supple with no adenopathy JVP normal no bruits no thyromegaly  Lungs clear with no wheezing and good diaphragmatic motion Heart:  S1/S2 no murmur, no rub, gallop or click PMI normal Abdomen: benighn, BS positve, no tenderness, no AAA no bruit.  No HSM or HJR Distal pulses intact with no bruits No edema Neuro non-focal Skin warm and dry No muscular weakness   LABORATORY DATA:  EKG:    12/12/2022 SR rate 65 nonspecific ST changes    Lab Results  Component Value Date   WBC 8.1 07/18/2022   HGB 14.4 07/18/2022   HCT 43.4 07/18/2022   PLT 253.0 07/18/2022   GLUCOSE 411 (H) 07/18/2022   CHOL 224 (H) 12/19/2018   TRIG 112 12/19/2018   HDL 65 12/19/2018   LDLCALC 137 (H) 12/19/2018   ALT 15 10/24/2020   AST 11 (L) 10/24/2020   NA 134 (L) 07/18/2022   K 4.5 07/18/2022   CL 95 (L) 07/18/2022   CREATININE 0.79 07/18/2022   BUN 18 07/18/2022   CO2 34 (H) 07/18/2022   TSH 0.39 08/07/2013   HGBA1C 8.0 (H) 11/23/2012    Other Studies Reviewed Today:  Myovue: 03/29/19  Study Highlights    There was no ST segment deviation noted during stress. Nuclear stress EF: 63%. The left ventricular ejection fraction is normal (55-65%). The study is normal. This is a low risk study.   Fixed apical inferolateral defect with normal wall motion in that region, suggestive of artifact   Echocardiogram  12/12/2022   EF 65-70% mild MR   Assessment/Plan:  1. Chest pain - multiple risk factors atypical normal myovue 03-29-2019 observe  3. Swelling - dependant edema from weight October 2020 CT negative for PE PRN lasix  TTE  12/12/2022 normal EF only mild MR   4. GERD - f/u with [facility] dilated CBD [place] MRI normal 03/07/20   5. DM - per PCP - now on insulin   6.  HLD - on statin  7. COPD/asthma/emphysema - prior smoker. F/u Dr [name] abnormal gound glass in upper lobes on CTA 11/30/22 no PE Using oxygen at night  Given worsening dyspnea will update TTE PFT;s done 11/14/22 mild restriction and normal diffusion capacity  8.  Anxiety/Depression:  worse now on Buspar f/u [facility]   Lasix 20 mg daily BMET/BNP today  Disposition:   F/U in a year   Time:  Spent reviewing chart myovue,echo  pulmonary notes direct patient interview and composing note 20 minutes   Signed: Charlton Haws, MD  12/12/2022 10:13 AM  [facility] [address] [place], [place]  [address] Phone: [phone] Fax: [phone]

## 2022-12-12 ENCOUNTER — Ambulatory Visit (HOSPITAL_COMMUNITY)
Payer: No Typology Code available for payment source | Attending: Cardiovascular Disease | Admitting: Cardiovascular Disease

## 2022-12-12 ENCOUNTER — Ambulatory Visit (HOSPITAL_BASED_OUTPATIENT_CLINIC_OR_DEPARTMENT_OTHER): Payer: No Typology Code available for payment source

## 2022-12-12 ENCOUNTER — Encounter: Payer: Self-pay | Admitting: Cardiovascular Disease

## 2022-12-12 VITALS — BP 110/60 | HR 69 | Ht 69.0 in | Wt 270.4 lb

## 2022-12-12 DIAGNOSIS — J9601 Acute respiratory failure with hypoxia: Secondary | ICD-10-CM

## 2022-12-12 DIAGNOSIS — J432 Centrilobular emphysema: Secondary | ICD-10-CM | POA: Diagnosis not present

## 2022-12-12 DIAGNOSIS — I10 Essential (primary) hypertension: Secondary | ICD-10-CM | POA: Insufficient documentation

## 2022-12-12 DIAGNOSIS — R600 Localized edema: Secondary | ICD-10-CM | POA: Insufficient documentation

## 2022-12-12 LAB — ECHOCARDIOGRAM COMPLETE BUBBLE STUDY
Area-P 1/2: 3.75 cm2
S' Lateral: 3.4 cm

## 2022-12-12 MED ORDER — FUROSEMIDE 20 MG PO TABS: 20.0000 mg | ORAL_TABLET | Freq: Every day | ORAL | 3 refills | Status: DC

## 2022-12-12 NOTE — Patient Instructions (Signed)
Medication Instructions:  Your physician has recommended you make the following change in your medication:  1-START furosemide 20 mg by mouth daily  *If you need a refill on your cardiac medications before your next appointment, please call your pharmacy*  Lab Work Your physician recommends that you have lab work today- BMET and BNP  If you have labs (blood work) drawn today and your tests are completely normal, you will receive your results only by: MyChart Message (if you have MyChart) OR A paper copy in the mail If you have any lab test that is abnormal or we need to change your treatment, we will call you to review the results.  Follow-Up: At [facility], you and your health needs are our priority.  As part of our continuing mission to provide you with exceptional heart care, we have created designated Provider Care Teams.  These Care Teams include your primary Cardiologist (physician) and Advanced Practice Providers (APPs -  Physician Assistants and Nurse Practitioners) who all work together to provide you with the care you need, when you need it.  We recommend signing up for the patient portal called "MyChart".  Sign up information is provided on this After Visit Summary.  MyChart is used to connect with patients for Virtual Visits (Telemedicine).  Patients are able to view lab/test results, encounter notes, upcoming appointments, etc.  Non-urgent messages can be sent to your provider as well.   To learn more about what you can do with MyChart, go to [email or web address].    Your next appointment:   1 year(s)  Provider:   Charlton Haws, MD

## 2022-12-28 ENCOUNTER — Ambulatory Visit (HOSPITAL_BASED_OUTPATIENT_CLINIC_OR_DEPARTMENT_OTHER): Payer: No Typology Code available for payment source | Admitting: Pulmonary Disease

## 2022-12-28 ENCOUNTER — Encounter (HOSPITAL_BASED_OUTPATIENT_CLINIC_OR_DEPARTMENT_OTHER): Payer: Self-pay | Admitting: Pulmonary Disease

## 2022-12-28 VITALS — BP 120/82 | HR 72 | Resp 16 | Ht 69.0 in | Wt 264.4 lb

## 2022-12-28 DIAGNOSIS — J9611 Chronic respiratory failure with hypoxia: Secondary | ICD-10-CM

## 2022-12-28 DIAGNOSIS — J4530 Mild persistent asthma, uncomplicated: Secondary | ICD-10-CM | POA: Diagnosis not present

## 2022-12-28 MED ORDER — BUDESONIDE-FORMOTEROL FUMARATE 160-4.5 MCG/ACT IN AERO: 2.0000 | INHALATION_SPRAY | Freq: Two times a day (BID) | RESPIRATORY_TRACT | 12 refills | Status: DC

## 2022-12-28 NOTE — Assessment & Plan Note (Signed)
She does seem to have longstanding history of asthma so would benefit from inhaled steroid.  She has had issues with thrush with powder inhaler.  I think she would do better with Symbicort compared to Spiriva I will send in a prescription for Symbicort 160

## 2022-12-28 NOTE — Progress Notes (Signed)
Subjective:    Patient ID: Veronica Rivers, female    DOB: 06/22/1965, 57 y.o.   MRN: [id number]  HPI  57 yo remote smoker for FU of asthma/emphysema. She smoked about 1.5 packs/day before she quit in 99 about 25 pack years.  PMH - hypertension,  obstructive sleep apnea,  type 2 diabetes,  bipolar depression    She reports a diagnosis of asthma in her 40s triggered by exercise and weather changes and chest colds. She underwent detailed allergy testing which was negative. 4  Cats + 1 dog  in the house.   Meds - She was on Symbicort x years, changed to [facility] in 2017.  Incruse was stopped after her last visit 04/2018 showing good PFTs   PMH - esophageal spasm , chest pain >> negative stress test and CT angiogram  Meds - did not tolerate BREO due to nausea/vomiting    Chief Complaint  Patient presents with   Follow-up    Doing ok. Breathing is good with oxygen. She states there are periods of up to an hour she can go without oxygen but then it starts to fall.    Her last asthma exacerbation was in March, she was treated with Levaquin, prednisone and advised to restart BREO Ellipta. She routinely wears 2L oxygen a bedtime but more recently has been needing to use during the day.  She was last seen by me 08/2021. She was evaluated by APP 07/2022 for asthmatic bronchitis and Virgel Bouquet was restarted. On a follow-up visit 11/2022 she was noted to be hypoxic, she developed thrush with Breo and hence this was switched to Spiriva. CT angiogram and echo bubble study was noted which were both negative  7/26 >> she has a sinus infection that has moved into her chest and now she is coughing >> given antibiotic  She has gained 20 pounds within the last year, she was a started on Lasix by cardiology and has dropped 7 pounds  On ambulation without oxygen, saturation dropped to 87% on record with oxygen and stayed at 96% on 2 L POC  Significant tests/ events reviewed   11/2022 CT angiogram patchy GGO  left more than right upper lobe 02/2019 CT angiogram negative 10/2017 CT angiogram -no  evidence of pulmonary embolus,changes of bullous emphysema in apices   Echo bubble study 11/2022 -negative, RVSP 35   PFT 11/14/2022 >> FVC 2.88 (70%), FEV1 2.45 (76%), ratio 85, DLCO 21.81 (88%)  04/2018 PFTs ratio normal, FEV1 82%, TLC 89%, DLCO 85%   HST at [facility] medical 02/2020 -very mild OSA , RDI 8/h,, methylphenidate for excessive daytime somnolence   - oxygen saturation lowest 74%, 301 minutes less than 89%    Review of Systems neg for any significant sore throat, dysphagia, itching, sneezing, nasal congestion or excess/ purulent secretions, fever, chills, sweats, unintended wt loss, pleuritic or exertional cp, hempoptysis, orthopnea pnd or change in chronic leg swelling. Also denies presyncope, palpitations, heartburn, abdominal pain, nausea, vomiting, diarrhea or change in bowel or urinary habits, dysuria,hematuria, rash, arthralgias, visual complaints, headache, numbness weakness or ataxia.     Objective:   Physical Exam  Gen. Pleasant, obese, in no distress ENT - no lesions, no post nasal drip Neck: No JVD, no thyromegaly, no carotid bruits Lungs: no use of accessory muscles, no dullness to percussion, decreased without rales or rhonchi  Cardiovascular: Rhythm regular, heart sounds  normal, no murmurs or gallops, no peripheral edema Musculoskeletal: No deformities, no cyanosis or clubbing , no  tremors       Assessment & Plan:   Her hypoxia is likely on the basis of fluid retention.  She has lost 10 pounds with Lasix.  She does not have significant airway obstruction.  There is no evidence of pulmonary hypertension or ILD.  There is no evidence of right-to-left shunt

## 2022-12-28 NOTE — Patient Instructions (Addendum)
Low oxygen levels may be related to fluid retention.  Continue on Lasix.  Avoid too much salt in your food.  x ambulatory saturation on room air to qualify for oxygen  x referral to pulmonary rehab program  xRx for Symbicort 160-2 puffs twice daily

## 2022-12-28 NOTE — Assessment & Plan Note (Signed)
Ambulatory saturation today to requalify her for oxygen. Will refer her to pulmonary rehab program. Weight loss advised

## 2023-01-05 ENCOUNTER — Telehealth (HOSPITAL_COMMUNITY): Payer: Self-pay

## 2023-01-05 NOTE — Telephone Encounter (Signed)
Pt insurance is active and benefits verified through [employer]. Co-pay $0.00, DED $2,000.00/$954.07 met, out of pocket $4,000.00/$1,524.66 met, co-insurance 30%. No pre-authorization required. Sasha/[employer], 01/05/23 @ 10:39AM, VHQ#[id number]

## 2023-01-05 NOTE — Telephone Encounter (Signed)
Called to confirm appt. Pt confirmed appt. Instructed pt on proper footwear. Gave directions along with department number.

## 2023-01-05 NOTE — Telephone Encounter (Signed)
Called patient to see if she was interested in participating in the Pulmonary Rehab Program. Patient stated yes. Patient will come in for orientation on 12/10/22 @ 9AM and will attend the 1:15PM exercise class.

## 2023-01-06 ENCOUNTER — Encounter (HOSPITAL_COMMUNITY): Payer: Self-pay

## 2023-01-06 ENCOUNTER — Encounter (HOSPITAL_COMMUNITY)
Admission: RE | Admit: 2023-01-06 | Discharge: 2023-01-06 | Disposition: A | Discharge status: Home / Self Care | Payer: No Typology Code available for payment source | Source: Ambulatory Visit | Attending: Pulmonary Disease | Admitting: Pulmonary Disease

## 2023-01-06 VITALS — BP 108/68 | HR 72 | Ht 68.0 in | Wt 265.9 lb

## 2023-01-06 DIAGNOSIS — J9611 Chronic respiratory failure with hypoxia: Secondary | ICD-10-CM | POA: Diagnosis present

## 2023-01-06 NOTE — Progress Notes (Signed)
[name] 57 y.o. female Pulmonary Rehab Orientation Note This patient who was referred to Pulmonary Rehab by Dr. [name] with the diagnosis of Chronic respiratory failure arrived today in Cardiac and Pulmonary Rehab. She  arrived ambulatory with normal gait. She  does carry portable oxygen. [employer] is the provider for their DME. Per patient, Crystalynn uses oxygen continuously. Color good, skin warm and dry. Patient is oriented to time and place. Patient's medical history, psychosocial health, and medications reviewed. Psychosocial assessment reveals patient lives with spouse. Birgit is currently unemployed, disabled. Patient hobbies include watching tv and spending time with others. Patient reports her stress level is moderate. Areas of stress/anxiety include health. Patient does exhibit signs of depression. Signs of depression include panic and difficulty maintaining sleep. PHQ2/9 score 3/13. Eknoor shows good  coping skills with positive outlook on life. Offered emotional support and reassurance. Will continue to monitor. Physical assessment performed by Essie Hart RN. Please see their orientation physical assessment note. Rekisha reports she  does take medications as prescribed. Patient states she follows a diabetic diet. The patient has been trying to lose weight through a healthy diet and exercise program.. Patient's weight will be monitored closely. Demonstration and practice of PLB using pulse oximeter. Sevyn able to return demonstration satisfactorily. Safety and hand hygiene in the exercise area reviewed with patient. Hendy voices understanding of the information reviewed. Department expectations discussed with patient and achievable goals were set. The patient shows enthusiasm about attending the program and we look forward to working with Misty Stanley. Manju completed a 6 min walk test today and is scheduled to begin exercise on 01/10/23 at 1:15 pm.   [id number] Joya San, MS, ACSM-CEP

## 2023-01-06 NOTE — Progress Notes (Signed)
Pulmonary Individual Treatment Plan  Patient Details  Name: Veronica Rivers MRN: [id number] Date of Birth: Mar 15, 1966 Referring Provider:   Doristine Devoid Pulmonary Rehab Walk Test from 01/06/2023 in [facility]  Referring Provider [name]       Initial Encounter Date:  Flowsheet Row Pulmonary Rehab Walk Test from 01/06/2023 in [facility]  Date 01/06/23       Visit Diagnosis: Chronic respiratory failure with hypoxia (HCC)  Patient's Home Medications on Admission:   Current Outpatient Medications:    albuterol (VENTOLIN HFA) 108 (90 Base) MCG/ACT inhaler, Inhale 1-2 puffs into the lungs every 4 (four) hours as needed for wheezing or shortness of breath., Disp: 18 g, Rfl: 3   ARIPiprazole (ABILIFY) 15 MG tablet, Take 15 mg by mouth daily., Disp: , Rfl:    budesonide-formoterol (SYMBICORT) 160-4.5 MCG/ACT inhaler, Inhale 2 puffs into the lungs 2 (two) times daily., Disp: 1 each, Rfl: 12   carvedilol (COREG) 12.5 MG tablet, Take 1 tablet (12.5 mg total) by mouth 2 (two) times daily with a meal., Disp: 180 tablet, Rfl: 3   Cholecalciferol (VITAMIN D3) 125 MCG (5000 UT) CAPS, Take 5,000 Units by mouth daily., Disp: , Rfl:    diclofenac Sodium (VOLTAREN) 1 % GEL, Apply 2 g topically 4 (four) times daily as needed (knee pain)., Disp: , Rfl:    famotidine (PEPCID) 20 MG tablet, Take 20 mg by mouth daily., Disp: , Rfl:    fluticasone (FLONASE) 50 MCG/ACT nasal spray, Place 1 spray into both nostrils daily as needed for allergies., Disp: 15.8 mL, Rfl: 3   furosemide (LASIX) 20 MG tablet, Take 1 tablet (20 mg total) by mouth daily., Disp: 90 tablet, Rfl: 3   gabapentin (NEURONTIN) 300 MG capsule, Take 300 mg by mouth 2 (two) times daily., Disp: , Rfl:    hydrocortisone cream 0.5 %, Apply 1 application  topically 2 (two) times daily., Disp: , Rfl:    insulin lispro (HUMALOG) 100 UNIT/ML  injection, Inject into the skin 3 (three) times daily before meals., Disp: , Rfl:    lamoTRIgine (LAMICTAL) 200 MG tablet, Take 200 mg by mouth daily., Disp: , Rfl:    levocetirizine (XYZAL) 5 MG tablet, TAKE ONE TABLET BY MOUTH ONE TIME A DAY, Disp: 30 tablet, Rfl: 6   levothyroxine (SYNTHROID) 125 MCG tablet, Take 125 mcg by mouth at bedtime., Disp: , Rfl:    montelukast (SINGULAIR) 10 MG tablet, TAKE 1 TABLET BY MOUTH AT BEDTIME, Disp: 90 tablet, Rfl: 1   NEOMYCIN-POLYMYXIN-HYDROCORTISONE (CORTISPORIN) 1 % SOLN OTIC solution, Apply 1-2 drops to toe BID after soaking, Disp: 10 mL, Rfl: 1   nystatin (MYCOSTATIN) 100000 UNIT/ML suspension, Take 5 mLs (500,000 Units total) by mouth 2 (two) times daily., Disp: 120 mL, Rfl: 1   ONE TOUCH ULTRA TEST test strip, Inject 1 strip into the skin daily., Disp: , Rfl: 3   oxyCODONE-acetaminophen (PERCOCET) 10-325 MG tablet, Take 1 tablet by mouth 5 (five) times daily. As needed for pain, Disp: , Rfl:    PROMETHEGAN 12.5 MG suppository, Place 12.5 mg rectally 2 (two) times daily as needed for nausea or vomiting., Disp: , Rfl:    QUEtiapine (SEROQUEL) 400 MG tablet, Take 400 mg by mouth at bedtime., Disp: , Rfl:    SYNVISC 16 MG/2ML SOSY, , Disp: , Rfl:    DULOXETINE HCL PO, Take 120 mg by mouth daily  at 6 (six) AM. (Patient not taking: Reported on 01/06/2023), Disp: , Rfl:    nitroGLYCERIN (NITROSTAT) 0.4 MG SL tablet, Place 1 tablet (0.4 mg total) under the tongue every 5 (five) minutes as needed for chest pain., Disp: 25 tablet, Rfl: 3  Past Medical History: Past Medical History:  Diagnosis Date   Asthma    Bipolar depression (HCC)    Cellulitis    CHEST PAIN    DM    DYSPNEA    Emphysema of lung (HCC)    HYPERCHOLESTEROLEMIA    HYPERLIPIDEMIA    HYPOTHYROIDISM    Neuropathy    SUPRAVENTRICULAR TACHYCARDIA     Tobacco Use: Social History   Tobacco Use  Smoking Status Former   Current packs/day: 0.00   Types: Cigarettes   Start date:  11/24/1982   Quit date: 11/23/1997   Years since quitting: 25.1  Smokeless Tobacco Never    Labs: Review Flowsheet       Latest Ref Rng & Units 11/23/2012 07/02/2014 12/19/2018  Labs for ITP Cardiac and Pulmonary Rehab  Cholestrol 100 - 199 mg/dL - 161  096   LDL (calc) 0 - 99 mg/dL - 70  045   HDL-C >40 mg/dL - 98.11  65   Trlycerides 0 - 149 mg/dL - 914.7  829   Hemoglobin A1c <5.7 % 8.0  - -    Details            Capillary Blood Glucose: Lab Results  Component Value Date   GLUCAP 149 (H) 10/25/2020   GLUCAP 148 (H) 10/24/2020   GLUCAP 194 (H) 10/24/2020   GLUCAP 159 (H) 10/24/2020   GLUCAP 198 (H) 10/24/2020     Pulmonary Assessment Scores:  Pulmonary Assessment Scores     Row Name 01/06/23 1040         ADL UCSD   ADL Phase Entry     SOB Score total 48       CAT Score   CAT Score 13       mMRC Score   mMRC Score 2             UCSD: Self-administered rating of dyspnea associated with activities of daily living (ADLs) 6-point scale (0 = "not at all" to 5 = "maximal or unable to do because of breathlessness")  Scoring Scores range from 0 to 120.  Minimally important difference is 5 units  CAT: CAT can identify the health impairment of COPD patients and is better correlated with disease progression.  CAT has a scoring range of zero to 40. The CAT score is classified into four groups of low (less than 10), medium (10 - 20), high (21-30) and very high (31-40) based on the impact level of disease on health status. A CAT score over 10 suggests significant symptoms.  A worsening CAT score could be explained by an exacerbation, poor medication adherence, poor inhaler technique, or progression of COPD or comorbid conditions.  CAT MCID is 2 points  mMRC: mMRC (Modified Medical Research Council) Dyspnea Scale is used to assess the degree of baseline functional disability in patients of respiratory disease due to dyspnea. No minimal important difference is  established. A decrease in score of 1 point or greater is considered a positive change.   Pulmonary Function Assessment:  Pulmonary Function Assessment - 01/06/23 0938       Breath   Bilateral Breath Sounds Decreased    Shortness of Breath Yes;Fear of Shortness of Breath;Panic with Shortness of  Breath;Limiting activity             Exercise Target Goals: Exercise Program Goal: Individual exercise prescription set using results from initial 6 min walk test and THRR while considering  patient's activity barriers and safety.   Exercise Prescription Goal: Initial exercise prescription builds to 30-45 minutes a day of aerobic activity, 2-3 days per week.  Home exercise guidelines will be given to patient during program as part of exercise prescription that the participant will acknowledge.  Activity Barriers & Risk Stratification:  Activity Barriers & Cardiac Risk Stratification - 01/06/23 0935       Activity Barriers & Cardiac Risk Stratification   Activity Barriers Deconditioning;Muscular Weakness;Shortness of Breath;Other (comment);Back Problems;Arthritis    Comments bilateral knee pain, needs bilateral knee replacements             6 Minute Walk:  6 Minute Walk     Row Name 01/06/23 1052         6 Minute Walk   Phase Initial     Distance 857 feet     Walk Time 6 minutes     # of Rest Breaks 0     MPH 1.62     METS 1.99     RPE 7     Perceived Dyspnea  1     VO2 Peak 6.97     Symptoms No     Resting HR 72 bpm     Resting BP 108/68     Resting Oxygen Saturation  98 %     Exercise Oxygen Saturation  during 6 min walk 93 %     Max Ex. HR 97 bpm     Max Ex. BP 122/70     2 Minute Post BP 104/68       Interval HR   1 Minute HR 90     2 Minute HR 90     3 Minute HR 96     4 Minute HR 97     5 Minute HR 96     6 Minute HR 97     2 Minute Post HR 74     Interval Heart Rate? Yes       Interval Oxygen   Interval Oxygen? Yes     Baseline Oxygen Saturation  % 98 %     1 Minute Oxygen Saturation % 97 %     1 Minute Liters of Oxygen 2 L     2 Minute Oxygen Saturation % 94 %     2 Minute Liters of Oxygen 2 L     3 Minute Oxygen Saturation % 96 %     3 Minute Liters of Oxygen 2 L     4 Minute Oxygen Saturation % 96 %     4 Minute Liters of Oxygen 2 L     5 Minute Oxygen Saturation % 93 %     5 Minute Liters of Oxygen 2 L     6 Minute Oxygen Saturation % 96 %     6 Minute Liters of Oxygen 2 L     2 Minute Post Oxygen Saturation % 98 %     2 Minute Post Liters of Oxygen 2 L              Oxygen Initial Assessment:  Oxygen Initial Assessment - 01/06/23 1039       Home Oxygen   Home Oxygen Device Portable Concentrator    Sleep Oxygen Prescription Continuous  Liters per minute 2    Home Exercise Oxygen Prescription Pulsed    Liters per minute 2    Home Resting Oxygen Prescription Pulsed    Liters per minute 2    Compliance with Home Oxygen Use Yes      Initial 6 min Walk   Oxygen Used Continuous    Liters per minute 2      Program Oxygen Prescription   Program Oxygen Prescription Continuous    Liters per minute 2      Intervention   Short Term Goals To learn and exhibit compliance with exercise, home and travel O2 prescription;To learn and understand importance of maintaining oxygen saturations>88%;To learn and demonstrate proper use of respiratory medications;To learn and understand importance of monitoring SPO2 with pulse oximeter and demonstrate accurate use of the pulse oximeter.;To learn and demonstrate proper pursed lip breathing techniques or other breathing techniques.     Long  Term Goals Exhibits compliance with exercise, home  and travel O2 prescription;Verbalizes importance of monitoring SPO2 with pulse oximeter and return demonstration;Maintenance of O2 saturations>88%;Exhibits proper breathing techniques, such as pursed lip breathing or other method taught during program session;Compliance with respiratory  medication;Demonstrates proper use of MDI's             Oxygen Re-Evaluation:   Oxygen Discharge (Final Oxygen Re-Evaluation):   Initial Exercise Prescription:  Initial Exercise Prescription - 01/06/23 1100       Date of Initial Exercise RX and Referring Provider   Date 01/06/23    Referring Provider [name]    Expected Discharge Date 03/30/23      Oxygen   Oxygen Continuous    Liters 2    Maintain Oxygen Saturation 88% or higher      Treadmill   MPH 1.5    Grade 0    Minutes 15    METs 2.15      Recumbant Elliptical   Level 1    RPM 40    Watts 20    Minutes 15      Prescription Details   Frequency (times per week) 2    Duration Progress to 30 minutes of continuous aerobic without signs/symptoms of physical distress      Intensity   THRR 40-80% of Max Heartrate 65-131    Ratings of Perceived Exertion 11-13    Perceived Dyspnea 0-4      Progression   Progression Continue to progress workloads to maintain intensity without signs/symptoms of physical distress.      Resistance Training   Training Prescription Yes    Weight blue bands    Reps 10-15             Perform Capillary Blood Glucose checks as needed.  Exercise Prescription Changes:   Exercise Comments:   Exercise Goals and Review:   Exercise Goals     Row Name 01/06/23 0936             Exercise Goals   Increase Physical Activity Yes       Intervention Provide advice, education, support and counseling about physical activity/exercise needs.;Develop an individualized exercise prescription for aerobic and resistive training based on initial evaluation findings, risk stratification, comorbidities and participant's personal goals.       Expected Outcomes Short Term: Attend rehab on a regular basis to increase amount of physical activity.;Long Term: Exercising regularly at least 3-5 days a week.;Long Term: Add in home exercise to make exercise part of routine and to increase amount of  physical activity.       Increase Strength and Stamina Yes       Intervention Provide advice, education, support and counseling about physical activity/exercise needs.;Develop an individualized exercise prescription for aerobic and resistive training based on initial evaluation findings, risk stratification, comorbidities and participant's personal goals.       Expected Outcomes Short Term: Increase workloads from initial exercise prescription for resistance, speed, and METs.;Short Term: Perform resistance training exercises routinely during rehab and add in resistance training at home;Long Term: Improve cardiorespiratory fitness, muscular endurance and strength as measured by increased METs and functional capacity ( )       Able to understand and use rate of perceived exertion (RPE) scale Yes       Intervention Provide education and explanation on how to use RPE scale       Expected Outcomes Short Term: Able to use RPE daily in rehab to express subjective intensity level;Long Term:  Able to use RPE to guide intensity level when exercising independently       Able to understand and use Dyspnea scale Yes       Intervention Provide education and explanation on how to use Dyspnea scale       Expected Outcomes Short Term: Able to use Dyspnea scale daily in rehab to express subjective sense of shortness of breath during exertion;Long Term: Able to use Dyspnea scale to guide intensity level when exercising independently       Knowledge and understanding of Target Heart Rate Range (THRR) Yes       Intervention Provide education and explanation of THRR including how the numbers were predicted and where they are located for reference       Expected Outcomes Short Term: Able to state/look up THRR;Long Term: Able to use THRR to govern intensity when exercising independently;Short Term: Able to use daily as guideline for intensity in rehab       Understanding of Exercise Prescription Yes       Intervention  Provide education, explanation, and written materials on patient's individual exercise prescription       Expected Outcomes Short Term: Able to explain program exercise prescription;Long Term: Able to explain home exercise prescription to exercise independently                Exercise Goals Re-Evaluation :   Discharge Exercise Prescription (Final Exercise Prescription Changes):   Nutrition:  Target Goals: Understanding of nutrition guidelines, daily intake of sodium 1500mg , cholesterol 200mg , calories 30% from fat and 7% or less from saturated fats, daily to have 5 or more servings of fruits and vegetables.  Biometrics:  Pre Biometrics - 01/06/23 0917       Pre Biometrics   Grip Strength 35 kg              Nutrition Therapy Plan and Nutrition Goals:   Nutrition Assessments:  MEDIFICTS Score Key: ?70 Need to make dietary changes  40-70 Heart Healthy Diet ? 40 Therapeutic Level Cholesterol Diet   Picture Your Plate Scores: <16 Unhealthy dietary pattern with much room for improvement. 41-50 Dietary pattern unlikely to meet recommendations for good health and room for improvement. 51-60 More healthful dietary pattern, with some room for improvement.  >60 Healthy dietary pattern, although there may be some specific behaviors that could be improved.    Nutrition Goals Re-Evaluation:   Nutrition Goals Discharge (Final Nutrition Goals Re-Evaluation):   Psychosocial: Target Goals: Acknowledge presence or absence of significant depression and/or stress, maximize coping  skills, provide positive support system. Participant is able to verbalize types and ability to use techniques and skills needed for reducing stress and depression.  Initial Review & Psychosocial Screening:  Initial Psych Review & Screening - 01/06/23 0929       Initial Review   Current issues with Current Depression;Current Psychotropic Meds;Current Anxiety/Panic    Comments Pt is on treatment  for her anxiety and depressions. She feels this is helping her to a degree. Offered support as Luzelena well let us know if she needs any other resources.      Family Dynamics   Good Support System? Yes    Comments spouse and daughter      Barriers   Psychosocial barriers to participate in program The patient should benefit from training in stress management and relaxation.      Screening Interventions   Interventions Encouraged to exercise;To provide support and resources with identified psychosocial needs    Expected Outcomes Short Term goal: Utilizing psychosocial counselor, staff and physician to assist with identification of specific Stressors or current issues interfering with healing process. Setting desired goal for each stressor or current issue identified.;Long Term Goal: Stressors or current issues are controlled or eliminated.;Short Term goal: Identification and review with participant of any Quality of Life or Depression concerns found by scoring the questionnaire.;Long Term goal: The participant improves quality of Life and PHQ9 Scores as seen by post scores and/or verbalization of changes             Quality of Life Scores:  Scores of 19 and below usually indicate a poorer quality of life in these areas.  A difference of  2-3 points is a clinically meaningful difference.  A difference of 2-3 points in the total score of the Quality of Life Index has been associated with significant improvement in overall quality of life, self-image, physical symptoms, and general health in studies assessing change in quality of life.  PHQ-9: Review Flowsheet       01/06/2023 04/20/2016  Depression screen PHQ 2/9  Decreased Interest 1 0  Down, Depressed, Hopeless 2 0  PHQ - 2 Score 3 0  Altered sleeping 2 -  Tired, decreased energy 3 -  Change in appetite 1 -  Feeling bad or failure about yourself  2 -  Trouble concentrating 1 -  Moving slowly or fidgety/restless 0 -  Suicidal thoughts  1 -  PHQ-9 Score 13 -  Difficult doing work/chores Somewhat difficult -    Details           Interpretation of Total Score  Total Score Depression Severity:  1-4 = Minimal depression, 5-9 = Mild depression, 10-14 = Moderate depression, 15-19 = Moderately severe depression, 20-27 = Severe depression   Psychosocial Evaluation and Intervention:  Psychosocial Evaluation - 01/06/23 0939       Psychosocial Evaluation & Interventions   Interventions Encouraged to exercise with the program and follow exercise prescription    Comments Pt is on treatment for anxiety and depression.    Expected Outcomes For Baraa to participate in PR free of psychosocial barriers.    Continue Psychosocial Services  No Follow up required             Psychosocial Re-Evaluation:   Psychosocial Discharge (Final Psychosocial Re-Evaluation):   Education: Education Goals: Education classes will be provided on a weekly basis, covering required topics. Participant will state understanding/return demonstration of topics presented.  Learning Barriers/Preferences:  Learning Barriers/Preferences - 01/06/23 1047  Learning Barriers/Preferences   Learning Barriers Sight    Learning Preferences None             Education Topics: Introduction to Pulmonary Rehab Group instruction provided by PowerPoint, verbal discussion, and written material to support subject matter. Instructor reviews what Pulmonary Rehab is, the purpose of the program, and how patients are referred.     Know Your Numbers Group instruction that is supported by a PowerPoint presentation. Instructor discusses importance of knowing and understanding resting, exercise, and post-exercise oxygen saturation, heart rate, and blood pressure. Oxygen saturation, heart rate, blood pressure, rating of perceived exertion, and dyspnea are reviewed along with a normal range for these values.    Exercise for the Pulmonary Patient Group  instruction that is supported by a PowerPoint presentation. Instructor discusses benefits of exercise, core components of exercise, frequency, duration, and intensity of an exercise routine, importance of utilizing pulse oximetry during exercise, safety while exercising, and options of places to exercise outside of rehab.       MET Level  Group instruction provided by PowerPoint, verbal discussion, and written material to support subject matter. Instructor reviews what METs are and how to increase METs.    Pulmonary Medications Verbally interactive group education provided by instructor with focus on inhaled medications and proper administration.   Anatomy and Physiology of the Respiratory System Group instruction provided by PowerPoint, verbal discussion, and written material to support subject matter. Instructor reviews respiratory cycle and anatomical components of the respiratory system and their functions. Instructor also reviews differences in obstructive and restrictive respiratory diseases with examples of each.    Oxygen Safety Group instruction provided by PowerPoint, verbal discussion, and written material to support subject matter. There is an overview of "What is Oxygen" and "Why do we need it".  Instructor also reviews how to create a safe environment for oxygen use, the importance of using oxygen as prescribed, and the risks of noncompliance. There is a brief discussion on traveling with oxygen and resources the patient may utilize.   Oxygen Use Group instruction provided by PowerPoint, verbal discussion, and written material to discuss how supplemental oxygen is prescribed and different types of oxygen supply systems. Resources for more information are provided.    Breathing Techniques Group instruction that is supported by demonstration and informational handouts. Instructor discusses the benefits of pursed lip and diaphragmatic breathing and detailed demonstration on how  to perform both.     Risk Factor Reduction Group instruction that is supported by a PowerPoint presentation. Instructor discusses the definition of a risk factor, different risk factors for pulmonary disease, and how the heart and lungs work together.   MD Day A group question and answer session with a medical doctor that allows participants to ask questions that relate to their pulmonary disease state.   Nutrition for the Pulmonary Patient Group instruction provided by PowerPoint slides, verbal discussion, and written materials to support subject matter. The instructor gives an explanation and review of healthy diet recommendations, which includes a discussion on weight management, recommendations for fruit and vegetable consumption, as well as protein, fluid, caffeine, fiber, sodium, sugar, and alcohol. Tips for eating when patients are short of breath are discussed.    Other Education Group or individual verbal, written, or video instructions that support the educational goals of the pulmonary rehab program.    Knowledge Questionnaire Score:  Knowledge Questionnaire Score - 01/06/23 1047       Knowledge Questionnaire Score   Pre Score 16/18  Core Components/Risk Factors/Patient Goals at Admission:  Personal Goals and Risk Factors at Admission - 01/06/23 0940       Core Components/Risk Factors/Patient Goals on Admission    Weight Management Weight Loss;Yes    Intervention Weight Management: Develop a combined nutrition and exercise program designed to reach desired caloric intake, while maintaining appropriate intake of nutrient and fiber, sodium and fats, and appropriate energy expenditure required for the weight goal.;Weight Management: Provide education and appropriate resources to help participant work on and attain dietary goals.;Weight Management/Obesity: Establish reasonable short term and long term weight goals.;Obesity: Provide education and appropriate  resources to help participant work on and attain dietary goals.    Expected Outcomes Short Term: Continue to assess and modify interventions until short term weight is achieved;Long Term: Adherence to nutrition and physical activity/exercise program aimed toward attainment of established weight goal;Weight Maintenance: Understanding of the daily nutrition guidelines, which includes 25-35% calories from fat, 7% or less cal from saturated fats, less than 200mg  cholesterol, less than 1.5gm of sodium, & 5 or more servings of fruits and vegetables daily;Weight Loss: Understanding of general recommendations for a balanced deficit meal plan, which promotes 1-2 lb weight loss per week and includes a negative energy balance of [phone] kcal/d;Understanding recommendations for meals to include 15-35% energy as protein, 25-35% energy from fat, 35-60% energy from carbohydrates, less than 200mg  of dietary cholesterol, 20-35 gm of total fiber daily;Understanding of distribution of calorie intake throughout the day with the consumption of 4-5 meals/snacks    Improve shortness of breath with ADL's Yes    Intervention Provide education, individualized exercise plan and daily activity instruction to help decrease symptoms of SOB with activities of daily living.    Expected Outcomes Short Term: Improve cardiorespiratory fitness to achieve a reduction of symptoms when performing ADLs;Long Term: Be able to perform more ADLs without symptoms or delay the onset of symptoms             Core Components/Risk Factors/Patient Goals Review:    Core Components/Risk Factors/Patient Goals at Discharge (Final Review):    ITP Comments: Dr. Mechele Collin is Medical Director for Pulmonary Rehab at [facility].

## 2023-01-06 NOTE — Progress Notes (Signed)
Pulmonary Rehab Orientation Physical Assessment Note  Physical assessment reveals patient is alert and oriented x 4. Heart rate is normal, breath sounds diminished. Pt denies chronic cough. Bowel sounds present x4 quads. Pt denies abdominal discomfort, nausea, vomiting, diarrhea or constipation. Grip strength equal, strong. Distal pulses +2; +1 swelling to lower extremities.   Essie Hart, RN, BSN

## 2023-01-10 ENCOUNTER — Encounter (HOSPITAL_COMMUNITY)
Admission: RE | Admit: 2023-01-10 | Discharge: 2023-01-10 | Disposition: A | Discharge status: Home / Self Care | Payer: No Typology Code available for payment source | Source: Ambulatory Visit | Attending: Pulmonary Disease | Admitting: Pulmonary Disease

## 2023-01-10 VITALS — Wt 263.7 lb

## 2023-01-10 DIAGNOSIS — J9611 Chronic respiratory failure with hypoxia: Secondary | ICD-10-CM | POA: Diagnosis present

## 2023-01-10 LAB — GLUCOSE, CAPILLARY
Glucose-Capillary: 162 mg/dL — ABNORMAL HIGH (ref 70–99)
Glucose-Capillary: 88 mg/dL (ref 70–99)

## 2023-01-10 NOTE — Progress Notes (Signed)
Daily Session Note  Patient Details  Name: Veronica Rivers MRN: [id number] Date of Birth: 12-06-1965 Referring Provider:   Doristine Devoid Pulmonary Rehab Walk Test from 01/06/2023 in [facility]  Referring Provider [name]       Encounter Date: 01/10/2023  Check In:  Session Check In - 01/10/23 1416       Check-In   Supervising physician immediately available to respond to emergencies [facility] MD immediately available    Physician(s) Bernadene Person, NP    Location [facility]    Staff Present Essie Hart, RN, Doris Cheadle, MS, ACSM-CEP, Exercise Physiologist;Donetta Isaza Hermine Messick Belarus, RD, LDN;R[facility], ACSM-CEP, Exercise Physiologist    Virtual Visit No    Medication changes reported     No    Fall or balance concerns reported    No    Tobacco Cessation No Change    Warm-up and Cool-down Performed as group-led instruction   Orientation   Resistance Training Performed Yes    VAD Patient? No    PAD/SET Patient? No      Pain Assessment   Currently in Pain? No/denies             Capillary Blood Glucose: Results for orders placed or performed during the hospital encounter of 01/10/23 (from the past 24 hour(s))  Glucose, capillary     Status: None   Collection Time: 01/10/23  2:48 PM  Result Value Ref Range   Glucose-Capillary 88 70 - 99 mg/dL     Exercise Prescription Changes - 01/10/23 1500       Response to Exercise   Blood Pressure (Admit) 130/72    Blood Pressure (Exercise) 142/78    Blood Pressure (Exit) 110/60    Heart Rate (Admit) 82 bpm    Heart Rate (Exercise) 100 bpm    Heart Rate (Exit) 83 bpm    Oxygen Saturation (Admit) 94 %    Oxygen Saturation (Exercise) 94 %    Oxygen Saturation (Exit) 95 %    Rating of Perceived Exertion (Exercise) 13    Perceived Dyspnea (Exercise) 2    Duration Progress to 30 minutes of  aerobic without signs/symptoms of physical distress     Intensity THRR unchanged      Progression   Progression Continue to progress workloads to maintain intensity without signs/symptoms of physical distress.      Resistance Training   Training Prescription Yes    Weight blue bands    Reps 10-15    Time 10 Minutes      Oxygen   Oxygen Continuous    Liters 2      Treadmill   MPH 1.5    Grade 0    Minutes 15    METs 2.15      Recumbant Elliptical   Level 1    Minutes 15    METs 1      Oxygen   Maintain Oxygen Saturation 88% or higher             Social History   Tobacco Use  Smoking Status Former   Current packs/day: 0.00   Types: Cigarettes   Start date: 11/24/1982   Quit date: 11/23/1997   Years since quitting: 25.1  Smokeless Tobacco Never    Goals Met:  Proper associated with RPD/PD & O2 Sat Independence with exercise equipment Exercise tolerated well No report of concerns or symptoms today Strength training completed today  Goals Unmet:  Not Applicable  Comments: Service time is from 1317 to 1446.    Dr. Mechele Collin is Medical Director for Pulmonary Rehab at [facility].

## 2023-01-11 NOTE — Progress Notes (Signed)
Pulmonary Individual Treatment Plan  Patient Details  Name: Veronica Rivers MRN: [id number] Date of Birth: 1965-07-31 Referring Provider:   Doristine Devoid Pulmonary Rehab Walk Test from 01/06/2023 in [facility]  Referring Provider [name]       Initial Encounter Date:  Flowsheet Row Pulmonary Rehab Walk Test from 01/06/2023 in [facility]  Date 01/06/23       Visit Diagnosis: Chronic respiratory failure with hypoxia (HCC)  Patient's Home Medications on Admission:   Current Outpatient Medications:    albuterol (VENTOLIN HFA) 108 (90 Base) MCG/ACT inhaler, Inhale 1-2 puffs into the lungs every 4 (four) hours as needed for wheezing or shortness of breath., Disp: 18 g, Rfl: 3   ARIPiprazole (ABILIFY) 15 MG tablet, Take 15 mg by mouth daily., Disp: , Rfl:    budesonide-formoterol (SYMBICORT) 160-4.5 MCG/ACT inhaler, Inhale 2 puffs into the lungs 2 (two) times daily., Disp: 1 each, Rfl: 12   carvedilol (COREG) 12.5 MG tablet, Take 1 tablet (12.5 mg total) by mouth 2 (two) times daily with a meal., Disp: 180 tablet, Rfl: 3   Cholecalciferol (VITAMIN D3) 125 MCG (5000 UT) CAPS, Take 5,000 Units by mouth daily., Disp: , Rfl:    diclofenac Sodium (VOLTAREN) 1 % GEL, Apply 2 g topically 4 (four) times daily as needed (knee pain)., Disp: , Rfl:    DULOXETINE HCL PO, Take 120 mg by mouth daily at 6 (six) AM. (Patient not taking: Reported on 01/06/2023), Disp: , Rfl:    famotidine (PEPCID) 20 MG tablet, Take 20 mg by mouth daily., Disp: , Rfl:    fluticasone (FLONASE) 50 MCG/ACT nasal spray, Place 1 spray into both nostrils daily as needed for allergies., Disp: 15.8 mL, Rfl: 3   furosemide (LASIX) 20 MG tablet, Take 1 tablet (20 mg total) by mouth daily., Disp: 90 tablet, Rfl: 3   gabapentin (NEURONTIN) 300 MG capsule, Take 300 mg by mouth 2 (two) times daily., Disp: , Rfl:     hydrocortisone cream 0.5 %, Apply 1 application  topically 2 (two) times daily., Disp: , Rfl:    insulin lispro (HUMALOG) 100 UNIT/ML injection, Inject into the skin 3 (three) times daily before meals., Disp: , Rfl:    lamoTRIgine (LAMICTAL) 200 MG tablet, Take 200 mg by mouth daily., Disp: , Rfl:    levocetirizine (XYZAL) 5 MG tablet, TAKE ONE TABLET BY MOUTH ONE TIME A DAY, Disp: 30 tablet, Rfl: 6   levothyroxine (SYNTHROID) 125 MCG tablet, Take 125 mcg by mouth at bedtime., Disp: , Rfl:    montelukast (SINGULAIR) 10 MG tablet, TAKE 1 TABLET BY MOUTH AT BEDTIME, Disp: 90 tablet, Rfl: 1   NEOMYCIN-POLYMYXIN-HYDROCORTISONE (CORTISPORIN) 1 % SOLN OTIC solution, Apply 1-2 drops to toe BID after soaking, Disp: 10 mL, Rfl: 1   nitroGLYCERIN (NITROSTAT) 0.4 MG SL tablet, Place 1 tablet (0.4 mg total) under the tongue every 5 (five) minutes as needed for chest pain., Disp: 25 tablet, Rfl: 3   nystatin (MYCOSTATIN) 100000 UNIT/ML suspension, Take 5 mLs (500,000 Units total) by mouth 2 (two) times daily., Disp: 120 mL, Rfl: 1   ONE TOUCH ULTRA TEST test strip, Inject 1 strip into the skin daily., Disp: , Rfl: 3   oxyCODONE-acetaminophen (PERCOCET) 10-325 MG tablet, Take 1 tablet by mouth 5 (five) times daily. As needed for pain, Disp: , Rfl:    PROMETHEGAN 12.5 MG suppository, Place 12.5  mg rectally 2 (two) times daily as needed for nausea or vomiting., Disp: , Rfl:    QUEtiapine (SEROQUEL) 400 MG tablet, Take 400 mg by mouth at bedtime., Disp: , Rfl:    SYNVISC 16 MG/2ML SOSY, , Disp: , Rfl:   Past Medical History: Past Medical History:  Diagnosis Date   Asthma    Bipolar depression (HCC)    Cellulitis    CHEST PAIN    DM    DYSPNEA    Emphysema of lung (HCC)    HYPERCHOLESTEROLEMIA    HYPERLIPIDEMIA    HYPOTHYROIDISM    Neuropathy    SUPRAVENTRICULAR TACHYCARDIA     Tobacco Use: Social History   Tobacco Use  Smoking Status Former   Current packs/day: 0.00   Types: Cigarettes   Start  date: 11/24/1982   Quit date: 11/23/1997   Years since quitting: 25.1  Smokeless Tobacco Never    Labs: Review Flowsheet       Latest Ref Rng & Units 11/23/2012 07/02/2014 12/19/2018  Labs for ITP Cardiac and Pulmonary Rehab  Cholestrol 100 - 199 mg/dL - 433  295   LDL (calc) 0 - 99 mg/dL - 70  188   HDL-C >41 mg/dL - 66.06  65   Trlycerides 0 - 149 mg/dL - 301.6  010   Hemoglobin A1c <5.7 % 8.0  - -    Details            Capillary Blood Glucose: Lab Results  Component Value Date   GLUCAP 88 01/10/2023   GLUCAP 162 (H) 01/10/2023   GLUCAP 149 (H) 10/25/2020   GLUCAP 148 (H) 10/24/2020   GLUCAP 194 (H) 10/24/2020     Pulmonary Assessment Scores:  Pulmonary Assessment Scores     Row Name 01/06/23 1040         ADL UCSD   ADL Phase Entry     SOB Score total 48       CAT Score   CAT Score 13       mMRC Score   mMRC Score 2             UCSD: Self-administered rating of dyspnea associated with activities of daily living (ADLs) 6-point scale (0 = "not at all" to 5 = "maximal or unable to do because of breathlessness")  Scoring Scores range from 0 to 120.  Minimally important difference is 5 units  CAT: CAT can identify the health impairment of COPD patients and is better correlated with disease progression.  CAT has a scoring range of zero to 40. The CAT score is classified into four groups of low (less than 10), medium (10 - 20), high (21-30) and very high (31-40) based on the impact level of disease on health status. A CAT score over 10 suggests significant symptoms.  A worsening CAT score could be explained by an exacerbation, poor medication adherence, poor inhaler technique, or progression of COPD or comorbid conditions.  CAT MCID is 2 points  mMRC: mMRC (Modified Medical Research Council) Dyspnea Scale is used to assess the degree of baseline functional disability in patients of respiratory disease due to dyspnea. No minimal important difference is  established. A decrease in score of 1 point or greater is considered a positive change.   Pulmonary Function Assessment:  Pulmonary Function Assessment - 01/06/23 0938       Breath   Bilateral Breath Sounds Decreased    Shortness of Breath Yes;Fear of Shortness of Breath;Panic with Shortness of Breath;Limiting  activity             Exercise Target Goals: Exercise Program Goal: Individual exercise prescription set using results from initial 6 min walk test and THRR while considering  patient's activity barriers and safety.   Exercise Prescription Goal: Initial exercise prescription builds to 30-45 minutes a day of aerobic activity, 2-3 days per week.  Home exercise guidelines will be given to patient during program as part of exercise prescription that the participant will acknowledge.  Activity Barriers & Risk Stratification:  Activity Barriers & Cardiac Risk Stratification - 01/06/23 0935       Activity Barriers & Cardiac Risk Stratification   Activity Barriers Deconditioning;Muscular Weakness;Shortness of Breath;Other (comment);Back Problems;Arthritis    Comments bilateral knee pain, needs bilateral knee replacements             6 Minute Walk:  6 Minute Walk     Row Name 01/06/23 1052         6 Minute Walk   Phase Initial     Distance 857 feet     Walk Time 6 minutes     # of Rest Breaks 0     MPH 1.62     METS 1.99     RPE 7     Perceived Dyspnea  1     VO2 Peak 6.97     Symptoms No     Resting HR 72 bpm     Resting BP 108/68     Resting Oxygen Saturation  98 %     Exercise Oxygen Saturation  during 6 min walk 93 %     Max Ex. HR 97 bpm     Max Ex. BP 122/70     2 Minute Post BP 104/68       Interval HR   1 Minute HR 90     2 Minute HR 90     3 Minute HR 96     4 Minute HR 97     5 Minute HR 96     6 Minute HR 97     2 Minute Post HR 74     Interval Heart Rate? Yes       Interval Oxygen   Interval Oxygen? Yes     Baseline Oxygen Saturation  % 98 %     1 Minute Oxygen Saturation % 97 %     1 Minute Liters of Oxygen 2 L     2 Minute Oxygen Saturation % 94 %     2 Minute Liters of Oxygen 2 L     3 Minute Oxygen Saturation % 96 %     3 Minute Liters of Oxygen 2 L     4 Minute Oxygen Saturation % 96 %     4 Minute Liters of Oxygen 2 L     5 Minute Oxygen Saturation % 93 %     5 Minute Liters of Oxygen 2 L     6 Minute Oxygen Saturation % 96 %     6 Minute Liters of Oxygen 2 L     2 Minute Post Oxygen Saturation % 98 %     2 Minute Post Liters of Oxygen 2 L              Oxygen Initial Assessment:  Oxygen Initial Assessment - 01/06/23 1039       Home Oxygen   Home Oxygen Device Portable Concentrator    Sleep Oxygen Prescription Continuous  Liters per minute 2    Home Exercise Oxygen Prescription Pulsed    Liters per minute 2    Home Resting Oxygen Prescription Pulsed    Liters per minute 2    Compliance with Home Oxygen Use Yes      Initial 6 min Walk   Oxygen Used Continuous    Liters per minute 2      Program Oxygen Prescription   Program Oxygen Prescription Continuous    Liters per minute 2      Intervention   Short Term Goals To learn and exhibit compliance with exercise, home and travel O2 prescription;To learn and understand importance of maintaining oxygen saturations>88%;To learn and demonstrate proper use of respiratory medications;To learn and understand importance of monitoring SPO2 with pulse oximeter and demonstrate accurate use of the pulse oximeter.;To learn and demonstrate proper pursed lip breathing techniques or other breathing techniques.     Long  Term Goals Exhibits compliance with exercise, home  and travel O2 prescription;Verbalizes importance of monitoring SPO2 with pulse oximeter and return demonstration;Maintenance of O2 saturations>88%;Exhibits proper breathing techniques, such as pursed lip breathing or other method taught during program session;Compliance with respiratory  medication;Demonstrates proper use of MDI's             Oxygen Re-Evaluation:   Oxygen Discharge (Final Oxygen Re-Evaluation):   Initial Exercise Prescription:  Initial Exercise Prescription - 01/06/23 1100       Date of Initial Exercise RX and Referring Provider   Date 01/06/23    Referring Provider [name]    Expected Discharge Date 03/30/23      Oxygen   Oxygen Continuous    Liters 2    Maintain Oxygen Saturation 88% or higher      Treadmill   MPH 1.5    Grade 0    Minutes 15    METs 2.15      Recumbant Elliptical   Level 1    RPM 40    Watts 20    Minutes 15      Prescription Details   Frequency (times per week) 2    Duration Progress to 30 minutes of continuous aerobic without signs/symptoms of physical distress      Intensity   THRR 40-80% of Max Heartrate 65-131    Ratings of Perceived Exertion 11-13    Perceived Dyspnea 0-4      Progression   Progression Continue to progress workloads to maintain intensity without signs/symptoms of physical distress.      Resistance Training   Training Prescription Yes    Weight blue bands    Reps 10-15             Perform Capillary Blood Glucose checks as needed.  Exercise Prescription Changes:   Exercise Prescription Changes     Row Name 01/10/23 1500             Response to Exercise   Blood Pressure (Admit) 130/72       Blood Pressure (Exercise) 142/78       Blood Pressure (Exit) 110/60       Heart Rate (Admit) 82 bpm       Heart Rate (Exercise) 100 bpm       Heart Rate (Exit) 83 bpm       Oxygen Saturation (Admit) 94 %       Oxygen Saturation (Exercise) 94 %       Oxygen Saturation (Exit) 95 %       Rating of  Perceived Exertion (Exercise) 13       Perceived Dyspnea (Exercise) 2       Duration Progress to 30 minutes of  aerobic without signs/symptoms of physical distress       Intensity THRR unchanged         Progression   Progression Continue to progress workloads to maintain  intensity without signs/symptoms of physical distress.         Resistance Training   Training Prescription Yes       Weight blue bands       Reps 10-15       Time 10 Minutes         Oxygen   Oxygen Continuous       Liters 2         Treadmill   MPH 1.5       Grade 0       Minutes 15       METs 2.15         Recumbant Elliptical   Level 1       Minutes 15       METs 1         Oxygen   Maintain Oxygen Saturation 88% or higher                Exercise Comments:   Exercise Comments     Row Name 01/10/23 1529           Exercise Comments Pt completed first day of group exercise. She exercised 15 min on the recumbent elliptical at level 1, METs 1.0. She then walked on the treadmill at 1.5 mph, incline 0, METs 2.15. Tolerated well overall. Performed warm up and cool down standing, practiced sit to stands in place of squats. Discussed METs with good reception.                Exercise Goals and Review:   Exercise Goals     Row Name 01/06/23 0936             Exercise Goals   Increase Physical Activity Yes       Intervention Provide advice, education, support and counseling about physical activity/exercise needs.;Develop an individualized exercise prescription for aerobic and resistive training based on initial evaluation findings, risk stratification, comorbidities and participant's personal goals.       Expected Outcomes Short Term: Attend rehab on a regular basis to increase amount of physical activity.;Long Term: Exercising regularly at least 3-5 days a week.;Long Term: Add in home exercise to make exercise part of routine and to increase amount of physical activity.       Increase Strength and Stamina Yes       Intervention Provide advice, education, support and counseling about physical activity/exercise needs.;Develop an individualized exercise prescription for aerobic and resistive training based on initial evaluation findings, risk stratification,  comorbidities and participant's personal goals.       Expected Outcomes Short Term: Increase workloads from initial exercise prescription for resistance, speed, and METs.;Short Term: Perform resistance training exercises routinely during rehab and add in resistance training at home;Long Term: Improve cardiorespiratory fitness, muscular endurance and strength as measured by increased METs and functional capacity ( )       Able to understand and use rate of perceived exertion (RPE) scale Yes       Intervention Provide education and explanation on how to use RPE scale       Expected Outcomes Short Term: Able to use RPE  daily in rehab to express subjective intensity level;Long Term:  Able to use RPE to guide intensity level when exercising independently       Able to understand and use Dyspnea scale Yes       Intervention Provide education and explanation on how to use Dyspnea scale       Expected Outcomes Short Term: Able to use Dyspnea scale daily in rehab to express subjective sense of shortness of breath during exertion;Long Term: Able to use Dyspnea scale to guide intensity level when exercising independently       Knowledge and understanding of Target Heart Rate Range (THRR) Yes       Intervention Provide education and explanation of THRR including how the numbers were predicted and where they are located for reference       Expected Outcomes Short Term: Able to state/look up THRR;Long Term: Able to use THRR to govern intensity when exercising independently;Short Term: Able to use daily as guideline for intensity in rehab       Understanding of Exercise Prescription Yes       Intervention Provide education, explanation, and written materials on patient's individual exercise prescription       Expected Outcomes Short Term: Able to explain program exercise prescription;Long Term: Able to explain home exercise prescription to exercise independently                Exercise Goals Re-Evaluation  :   Discharge Exercise Prescription (Final Exercise Prescription Changes):  Exercise Prescription Changes - 01/10/23 1500       Response to Exercise   Blood Pressure (Admit) 130/72    Blood Pressure (Exercise) 142/78    Blood Pressure (Exit) 110/60    Heart Rate (Admit) 82 bpm    Heart Rate (Exercise) 100 bpm    Heart Rate (Exit) 83 bpm    Oxygen Saturation (Admit) 94 %    Oxygen Saturation (Exercise) 94 %    Oxygen Saturation (Exit) 95 %    Rating of Perceived Exertion (Exercise) 13    Perceived Dyspnea (Exercise) 2    Duration Progress to 30 minutes of  aerobic without signs/symptoms of physical distress    Intensity THRR unchanged      Progression   Progression Continue to progress workloads to maintain intensity without signs/symptoms of physical distress.      Resistance Training   Training Prescription Yes    Weight blue bands    Reps 10-15    Time 10 Minutes      Oxygen   Oxygen Continuous    Liters 2      Treadmill   MPH 1.5    Grade 0    Minutes 15    METs 2.15      Recumbant Elliptical   Level 1    Minutes 15    METs 1      Oxygen   Maintain Oxygen Saturation 88% or higher             Nutrition:  Target Goals: Understanding of nutrition guidelines, daily intake of sodium 1500mg , cholesterol 200mg , calories 30% from fat and 7% or less from saturated fats, daily to have 5 or more servings of fruits and vegetables.  Biometrics:  Pre Biometrics - 01/06/23 0917       Pre Biometrics   Grip Strength 35 kg              Nutrition Therapy Plan and Nutrition Goals:  Nutrition Therapy & Goals - 01/10/23  1417       Nutrition Therapy   Diet Heart Healthy/Carbohydrate Consistent Diet      Personal Nutrition Goals   Nutrition Goal Patient to improve diet quality by using the plate method as a guide for meal planning to include lean protein/plant protein, fruits, vegetables, whole grains, nonfat dairy as part of a well-balanced diet.     Personal Goal #2 Patient to identify strategies for weight loss of 0.5-2.0# per week.    Comments Lynlee reports motivation to lose ~50#; she reports her highest weight was 271#. She does report that she has received some nutrition counseling through her therapist. Her husband is a good support. Patient will benefit from participation in pulmonary rehab for nutrition, exercise, and lifestyle modification.      Intervention Plan   Intervention Prescribe, educate and counsel regarding individualized specific dietary modifications aiming towards targeted core components such as weight, hypertension, lipid management, diabetes, heart failure and other comorbidities.;Nutrition handout(s) given to patient.    Expected Outcomes Short Term Goal: Understand basic principles of dietary content, such as calories, fat, sodium, cholesterol and nutrients.;Long Term Goal: Adherence to prescribed nutrition plan.             Nutrition Assessments:  MEDIFICTS Score Key: ?70 Need to make dietary changes  40-70 Heart Healthy Diet ? 40 Therapeutic Level Cholesterol Diet   Picture Your Plate Scores: <08 Unhealthy dietary pattern with much room for improvement. 41-50 Dietary pattern unlikely to meet recommendations for good health and room for improvement. 51-60 More healthful dietary pattern, with some room for improvement.  >60 Healthy dietary pattern, although there may be some specific behaviors that could be improved.    Nutrition Goals Re-Evaluation:  Nutrition Goals Re-Evaluation     Row Name 01/10/23 1417             Goals   Current Weight 263 lb 10.7 oz (119.6 kg)       Comment A1c, lipid panel available through [place]. Labs not available in Mychart.       Expected Outcome Veeha reports motivation to lose ~50#; she reports her highest weight was 271#. She does report that she has received some nutrition counseling through her therapist. Her husband is a good support. Patient will benefit from  participation in pulmonary rehab for nutrition, exercise, and lifestyle modification.                Nutrition Goals Discharge (Final Nutrition Goals Re-Evaluation):  Nutrition Goals Re-Evaluation - 01/10/23 1417       Goals   Current Weight 263 lb 10.7 oz (119.6 kg)    Comment A1c, lipid panel available through [place]. Labs not available in Mychart.    Expected Outcome Romayne reports motivation to lose ~50#; she reports her highest weight was 271#. She does report that she has received some nutrition counseling through her therapist. Her husband is a good support. Patient will benefit from participation in pulmonary rehab for nutrition, exercise, and lifestyle modification.             Psychosocial: Target Goals: Acknowledge presence or absence of significant depression and/or stress, maximize coping skills, provide positive support system. Participant is able to verbalize types and ability to use techniques and skills needed for reducing stress and depression.  Initial Review & Psychosocial Screening:  Initial Psych Review & Screening - 01/06/23 0929       Initial Review   Current issues with Current Depression;Current Psychotropic Meds;Current Anxiety/Panic  Comments Pt is on treatment for her anxiety and depressions. She feels this is helping her to a degree. Offered support as Amaly well let us know if she needs any other resources.      Family Dynamics   Good Support System? Yes    Comments spouse and daughter      Barriers   Psychosocial barriers to participate in program The patient should benefit from training in stress management and relaxation.      Screening Interventions   Interventions Encouraged to exercise;To provide support and resources with identified psychosocial needs    Expected Outcomes Short Term goal: Utilizing psychosocial counselor, staff and physician to assist with identification of specific Stressors or current issues interfering with healing  process. Setting desired goal for each stressor or current issue identified.;Long Term Goal: Stressors or current issues are controlled or eliminated.;Short Term goal: Identification and review with participant of any Quality of Life or Depression concerns found by scoring the questionnaire.;Long Term goal: The participant improves quality of Life and PHQ9 Scores as seen by post scores and/or verbalization of changes             Quality of Life Scores:  Scores of 19 and below usually indicate a poorer quality of life in these areas.  A difference of  2-3 points is a clinically meaningful difference.  A difference of 2-3 points in the total score of the Quality of Life Index has been associated with significant improvement in overall quality of life, self-image, physical symptoms, and general health in studies assessing change in quality of life.  PHQ-9: Review Flowsheet       01/06/2023 04/20/2016  Depression screen PHQ 2/9  Decreased Interest 1 0  Down, Depressed, Hopeless 2 0  PHQ - 2 Score 3 0  Altered sleeping 2 -  Tired, decreased energy 3 -  Change in appetite 1 -  Feeling bad or failure about yourself  2 -  Trouble concentrating 1 -  Moving slowly or fidgety/restless 0 -  Suicidal thoughts 1 -  PHQ-9 Score 13 -  Difficult doing work/chores Somewhat difficult -    Details           Interpretation of Total Score  Total Score Depression Severity:  1-4 = Minimal depression, 5-9 = Mild depression, 10-14 = Moderate depression, 15-19 = Moderately severe depression, 20-27 = Severe depression   Psychosocial Evaluation and Intervention:  Psychosocial Evaluation - 01/06/23 0939       Psychosocial Evaluation & Interventions   Interventions Encouraged to exercise with the program and follow exercise prescription    Comments Pt is on treatment for anxiety and depression.    Expected Outcomes For Kendelle to participate in PR free of psychosocial barriers.    Continue Psychosocial  Services  No Follow up required             Psychosocial Re-Evaluation:  Psychosocial Re-Evaluation     Row Name 01/11/23 [phone]             Psychosocial Re-Evaluation   Current issues with Current Depression;Current Psychotropic Meds;Current Anxiety/Panic       Comments No changes in Jozette's psychosocial eval. Kalee has completed 1 session so far. Saraye stated she is bipolar and is still trying to recover from a mental health breakdown last December. She states that she sees a psychiatrist who has recently increased her medication dosages. She stated she also see a therapist regularly. Pt denies any thoughts of suicidal ideation. Pt  denies any referrals or assistance at this time.       Expected Outcomes For Annalisia to continue to attend pulmonary rehab and to have a positive outlook and good coping skills to manage her depression, anxiety & panic.       Interventions Encouraged to attend Pulmonary Rehabilitation for the exercise       Continue Psychosocial Services  Follow up required by staff                Psychosocial Discharge (Final Psychosocial Re-Evaluation):  Psychosocial Re-Evaluation - 01/11/23 9518       Psychosocial Re-Evaluation   Current issues with Current Depression;Current Psychotropic Meds;Current Anxiety/Panic    Comments No changes in Kambree's psychosocial eval. Raeya has completed 1 session so far. Shakilya stated she is bipolar and is still trying to recover from a mental health breakdown last December. She states that she sees a psychiatrist who has recently increased her medication dosages. She stated she also see a therapist regularly. Pt denies any thoughts of suicidal ideation. Pt denies any referrals or assistance at this time.    Expected Outcomes For Alauna to continue to attend pulmonary rehab and to have a positive outlook and good coping skills to manage her depression, anxiety & panic.    Interventions Encouraged to attend Pulmonary Rehabilitation for the  exercise    Continue Psychosocial Services  Follow up required by staff             Education: Education Goals: Education classes will be provided on a weekly basis, covering required topics. Participant will state understanding/return demonstration of topics presented.  Learning Barriers/Preferences:  Learning Barriers/Preferences - 01/06/23 1047       Learning Barriers/Preferences   Learning Barriers Sight    Learning Preferences None             Education Topics: Introduction to Pulmonary Rehab Group instruction provided by PowerPoint, verbal discussion, and written material to support subject matter. Instructor reviews what Pulmonary Rehab is, the purpose of the program, and how patients are referred.     Know Your Numbers Group instruction that is supported by a PowerPoint presentation. Instructor discusses importance of knowing and understanding resting, exercise, and post-exercise oxygen saturation, heart rate, and blood pressure. Oxygen saturation, heart rate, blood pressure, rating of perceived exertion, and dyspnea are reviewed along with a normal range for these values.    Exercise for the Pulmonary Patient Group instruction that is supported by a PowerPoint presentation. Instructor discusses benefits of exercise, core components of exercise, frequency, duration, and intensity of an exercise routine, importance of utilizing pulse oximetry during exercise, safety while exercising, and options of places to exercise outside of rehab.    MET Level  Group instruction provided by PowerPoint, verbal discussion, and written material to support subject matter. Instructor reviews what METs are and how to increase METs.    Pulmonary Medications Verbally interactive group education provided by instructor with focus on inhaled medications and proper administration.   Anatomy and Physiology of the Respiratory System Group instruction provided by PowerPoint, verbal  discussion, and written material to support subject matter. Instructor reviews respiratory cycle and anatomical components of the respiratory system and their functions. Instructor also reviews differences in obstructive and restrictive respiratory diseases with examples of each.    Oxygen Safety Group instruction provided by PowerPoint, verbal discussion, and written material to support subject matter. There is an overview of "What is Oxygen" and "Why do we need it".  Instructor  also reviews how to create a safe environment for oxygen use, the importance of using oxygen as prescribed, and the risks of noncompliance. There is a brief discussion on traveling with oxygen and resources the patient may utilize.   Oxygen Use Group instruction provided by PowerPoint, verbal discussion, and written material to discuss how supplemental oxygen is prescribed and different types of oxygen supply systems. Resources for more information are provided.    Breathing Techniques Group instruction that is supported by demonstration and informational handouts. Instructor discusses the benefits of pursed lip and diaphragmatic breathing and detailed demonstration on how to perform both.     Risk Factor Reduction Group instruction that is supported by a PowerPoint presentation. Instructor discusses the definition of a risk factor, different risk factors for pulmonary disease, and how the heart and lungs work together.   MD Day A group question and answer session with a medical doctor that allows participants to ask questions that relate to their pulmonary disease state.   Nutrition for the Pulmonary Patient Group instruction provided by PowerPoint slides, verbal discussion, and written materials to support subject matter. The instructor gives an explanation and review of healthy diet recommendations, which includes a discussion on weight management, recommendations for fruit and vegetable consumption, as well as  protein, fluid, caffeine, fiber, sodium, sugar, and alcohol. Tips for eating when patients are short of breath are discussed.    Other Education Group or individual verbal, written, or video instructions that support the educational goals of the pulmonary rehab program.    Knowledge Questionnaire Score:  Knowledge Questionnaire Score - 01/06/23 1047       Knowledge Questionnaire Score   Pre Score 16/18             Core Components/Risk Factors/Patient Goals at Admission:  Personal Goals and Risk Factors at Admission - 01/06/23 0940       Core Components/Risk Factors/Patient Goals on Admission    Weight Management Weight Loss;Yes    Intervention Weight Management: Develop a combined nutrition and exercise program designed to reach desired caloric intake, while maintaining appropriate intake of nutrient and fiber, sodium and fats, and appropriate energy expenditure required for the weight goal.;Weight Management: Provide education and appropriate resources to help participant work on and attain dietary goals.;Weight Management/Obesity: Establish reasonable short term and long term weight goals.;Obesity: Provide education and appropriate resources to help participant work on and attain dietary goals.    Expected Outcomes Short Term: Continue to assess and modify interventions until short term weight is achieved;Long Term: Adherence to nutrition and physical activity/exercise program aimed toward attainment of established weight goal;Weight Maintenance: Understanding of the daily nutrition guidelines, which includes 25-35% calories from fat, 7% or less cal from saturated fats, less than 200mg  cholesterol, less than 1.5gm of sodium, & 5 or more servings of fruits and vegetables daily;Weight Loss: Understanding of general recommendations for a balanced deficit meal plan, which promotes 1-2 lb weight loss per week and includes a negative energy balance of [phone] kcal/d;Understanding  recommendations for meals to include 15-35% energy as protein, 25-35% energy from fat, 35-60% energy from carbohydrates, less than 200mg  of dietary cholesterol, 20-35 gm of total fiber daily;Understanding of distribution of calorie intake throughout the day with the consumption of 4-5 meals/snacks    Improve shortness of breath with ADL's Yes    Intervention Provide education, individualized exercise plan and daily activity instruction to help decrease symptoms of SOB with activities of daily living.    Expected Outcomes Short  Term: Improve cardiorespiratory fitness to achieve a reduction of symptoms when performing ADLs;Long Term: Be able to perform more ADLs without symptoms or delay the onset of symptoms             Core Components/Risk Factors/Patient Goals Review:   Goals and Risk Factor Review     Row Name 01/11/23 1224             Core Components/Risk Factors/Patient Goals Review   Personal Goals Review Weight Management/Obesity;Improve shortness of breath with ADL's;Develop more efficient breathing techniques such as purse lipped breathing and diaphragmatic breathing and practicing self-pacing with activity.       Review Unable to assess goals yet. Alethia has completed 1 class so far.       Expected Outcomes For Javiah to lose weight, improve her shortness of breath with ADLs, and devlop more efficient breathing techniques such as purse lipped breathing and diaphragmatic breathing; and practicing self-pacing with activity                Core Components/Risk Factors/Patient Goals at Discharge (Final Review):   Goals and Risk Factor Review - 01/11/23 1224       Core Components/Risk Factors/Patient Goals Review   Personal Goals Review Weight Management/Obesity;Improve shortness of breath with ADL's;Develop more efficient breathing techniques such as purse lipped breathing and diaphragmatic breathing and practicing self-pacing with activity.    Review Unable to assess goals yet.  Taler has completed 1 class so far.    Expected Outcomes For Markeysha to lose weight, improve her shortness of breath with ADLs, and devlop more efficient breathing techniques such as purse lipped breathing and diaphragmatic breathing; and practicing self-pacing with activity             ITP Comments: Pt has just started the program, completed 1 session. Recommend continued exercise, life style modification, education, and utilization of breathing techniques to increase stamina and strength, while also decreasing shortness of breath with exertion.     Comments: Dr. Mechele Collin is Medical Director for Pulmonary Rehab at [facility].

## 2023-01-12 ENCOUNTER — Encounter (HOSPITAL_COMMUNITY)
Admission: RE | Admit: 2023-01-12 | Discharge: 2023-01-12 | Disposition: A | Discharge status: Home / Self Care | Payer: No Typology Code available for payment source | Source: Ambulatory Visit | Attending: Pulmonary Disease | Admitting: Pulmonary Disease

## 2023-01-12 VITALS — Wt 262.6 lb

## 2023-01-12 DIAGNOSIS — J9611 Chronic respiratory failure with hypoxia: Secondary | ICD-10-CM

## 2023-01-12 LAB — GLUCOSE, CAPILLARY
Glucose-Capillary: 134 mg/dL — ABNORMAL HIGH (ref 70–99)
Glucose-Capillary: 51 mg/dL — ABNORMAL LOW (ref 70–99)
Glucose-Capillary: 120 mg/dL — ABNORMAL HIGH (ref 70–99)

## 2023-01-12 NOTE — Progress Notes (Signed)
Daily Session Note  Patient Details  Name: Veronica Rivers MRN: [id number] Date of Birth: [birth year] Referring Provider:   Doristine Devoid Pulmonary Rehab Walk Test from 01/06/2023 in [facility], [facility]  Referring Provider [name]       Encounter Date: 01/12/2023  Check In:  Session Check In - 01/12/23 1432       Check-In   Supervising physician immediately available to respond to emergencies [facility] MD immediately available    Physician(s) Alveria Apley, NP    Location [facility]    Staff Present Essie Hart, RN, Doris Cheadle, MS, ACSM-CEP, Exercise Physiologist;Casey Erin Sons BS, ACSM-CEP, Exercise Physiologist    Virtual Visit No    Medication changes reported     No    Fall or balance concerns reported    No    Tobacco Cessation No Change    Warm-up and Cool-down Performed as group-led instruction   Orientation   Resistance Training Performed Yes    VAD Patient? No    PAD/SET Patient? No      Pain Assessment   Currently in Pain? No/denies    Pain Score 0-No pain    Multiple Pain Sites No             Capillary Blood Glucose: Results for orders placed or performed during the hospital encounter of 01/12/23 (from the past 24 hour(s))  Glucose, capillary     Status: Abnormal   Collection Time: 01/12/23  2:40 PM  Result Value Ref Range   Glucose-Capillary 120 (H) 70 - 99 mg/dL      Social History   Tobacco Use  Smoking Status Former   Current packs/day: 0.00   Types: Cigarettes   Start date: 11/24/1982   Quit date: 11/23/1997   Years since quitting: 25.1  Smokeless Tobacco Never    Goals Met:  Queuing for purse lip breathing  Goals Unmet:  Blood sugar dropped, see progress note  Comments: Service time is from 1313 to 1456    Dr. Mechele Collin is Medical Director for Pulmonary Rehab at [facility].

## 2023-01-12 NOTE — Progress Notes (Signed)
Pulmonary Rehab Session Note  Patient Details  Name: Veronica Rivers MRN: [id number] Date of Birth: Sep 27, 1965 Referring Provider:   Doristine Devoid Pulmonary Rehab Walk Test from 01/06/2023 in [facility], [facility]  Referring Provider Dawn Robben reported dizziness and lightheadedness after her 2nd exercise station. She stated she was seeing black spots and felt like she was going to pass out. VS checked, BP 138/74, HR 91, O2 95% on 2L [place]. Pt's blood sugar checked 51. Pt given glucose gel (15g carbs). Waited 15 minutes and reassessed pt. She stated she felt the same. Graham crackers and 4oz juiced provided. Waited another and rechecked blood sugar 120. Pt stated she felt better and exit VS were WNL. BP 106/58, HR 75 and O2 94% on 2LNC.

## 2023-01-17 ENCOUNTER — Encounter (HOSPITAL_COMMUNITY): Payer: No Typology Code available for payment source

## 2023-01-19 ENCOUNTER — Encounter (HOSPITAL_COMMUNITY): Payer: No Typology Code available for payment source

## 2023-01-20 ENCOUNTER — Telehealth (HOSPITAL_COMMUNITY): Payer: Self-pay

## 2023-01-20 NOTE — Telephone Encounter (Signed)
Called Veronica Rivers to check on her. She stated she hasn't been feeling well this past week but plans to return 9/17.

## 2023-01-24 ENCOUNTER — Encounter (HOSPITAL_COMMUNITY): Payer: No Typology Code available for payment source

## 2023-01-26 ENCOUNTER — Encounter (HOSPITAL_COMMUNITY)
Admission: RE | Admit: 2023-01-26 | Discharge: 2023-01-26 | Disposition: A | Discharge status: Home / Self Care | Payer: No Typology Code available for payment source | Source: Ambulatory Visit | Attending: Pulmonary Disease | Admitting: Pulmonary Disease

## 2023-01-26 DIAGNOSIS — J9611 Chronic respiratory failure with hypoxia: Secondary | ICD-10-CM | POA: Diagnosis not present

## 2023-01-26 LAB — GLUCOSE, CAPILLARY
Glucose-Capillary: 188 mg/dL — ABNORMAL HIGH (ref 70–99)
Glucose-Capillary: 195 mg/dL — ABNORMAL HIGH (ref 70–99)
Glucose-Capillary: 138 mg/dL — ABNORMAL HIGH (ref 70–99)

## 2023-01-27 ENCOUNTER — Other Ambulatory Visit: Payer: Self-pay | Admitting: Pulmonary Disease

## 2023-01-27 DIAGNOSIS — J4530 Mild persistent asthma, uncomplicated: Secondary | ICD-10-CM

## 2023-01-31 ENCOUNTER — Encounter (HOSPITAL_COMMUNITY)
Admission: RE | Admit: 2023-01-31 | Discharge: 2023-01-31 | Disposition: A | Discharge status: Home / Self Care | Payer: No Typology Code available for payment source | Source: Ambulatory Visit | Attending: Pulmonary Disease | Admitting: Pulmonary Disease

## 2023-01-31 DIAGNOSIS — J9611 Chronic respiratory failure with hypoxia: Secondary | ICD-10-CM

## 2023-01-31 NOTE — Progress Notes (Signed)
Daily Session Note  Patient Details  Name: Veronica Rivers MRN: [id number] Date of Birth: [birth year] Referring Provider:   Doristine Devoid Pulmonary Rehab Walk Test from 01/06/2023 in [facility], Vascular, [facility]  Referring Provider [name]       Encounter Date: 01/31/2023  Check In:  Session Check In - 01/31/23 1323       Check-In   Supervising physician immediately available to respond to emergencies [facility] MD immediately available    Physician(s) Carlos Levering, NP    Location [facility]    Staff Present Essie Hart, RN, Doris Cheadle, MS, ACSM-CEP, Exercise Physiologist;Casey Erin Sons BS, ACSM-CEP, Exercise Physiologist    Virtual Visit No    Medication changes reported     No    Fall or balance concerns reported    No    Tobacco Cessation No Change    Warm-up and Cool-down Performed as group-led instruction   Orientation   Resistance Training Performed Yes    VAD Patient? No    PAD/SET Patient? No      Pain Assessment   Currently in Pain? No/denies    Pain Score 0-No pain    Multiple Pain Sites No             Capillary Blood Glucose: No results found for this or any previous visit (from the past 24 hour(s)).    Social History   Tobacco Use  Smoking Status Former   Current packs/day: 0.00   Types: Cigarettes   Start date: 11/24/1982   Quit date: 11/23/1997   Years since quitting: 25.2  Smokeless Tobacco Never    Goals Met:  Proper associated with RPD/PD & O2 Sat Independence with exercise equipment Exercise tolerated well No report of concerns or symptoms today Strength training completed today  Goals Unmet:  Not Applicable  Comments: Service time is from 1310 to 1437.    Dr. Mechele Collin is Medical Director for Pulmonary Rehab at [facility].

## 2023-02-02 ENCOUNTER — Encounter (HOSPITAL_COMMUNITY)
Admission: RE | Admit: 2023-02-02 | Discharge: 2023-02-02 | Disposition: A | Discharge status: Home / Self Care | Payer: No Typology Code available for payment source | Source: Ambulatory Visit | Attending: Pulmonary Disease | Admitting: Pulmonary Disease

## 2023-02-02 DIAGNOSIS — J9611 Chronic respiratory failure with hypoxia: Secondary | ICD-10-CM

## 2023-02-02 NOTE — Progress Notes (Signed)
Daily Session Note  Patient Details  Name: Veronica Rivers MRN: [id number] Date of Birth: 16-Aug-1965 Referring Provider:   Doristine Devoid Pulmonary Rehab Walk Test from 01/06/2023 in [facility], [facility], [facility]  Referring Provider [name]       Encounter Date: 02/02/2023  Check In:  Session Check In - 02/02/23 1412       Check-In   Supervising physician immediately available to respond to emergencies [facility] MD immediately available    Physician(s) Edd Fabian, NP    [facility]    Staff Present Essie Hart, RN, Doris Cheadle, MS, ACSM-CEP, Exercise Physiologist;Trystian Crisanto Erin Sons BS, ACSM-CEP, Exercise Physiologist    Virtual Visit No    Medication changes reported     No    Fall or balance concerns reported    No    Tobacco Cessation No Change    Warm-up and Cool-down Performed as group-led instruction   Orientation   Resistance Training Performed Yes    VAD Patient? No    PAD/SET Patient? No      Pain Assessment   Currently in Pain? No/denies             Capillary Blood Glucose: No results found for this or any previous visit (from the past 24 hour(s)).    Social History   Tobacco Use  Smoking Status Former   Current packs/day: 0.00   Types: Cigarettes   Start date: 11/24/1982   Quit date: 11/23/1997   Years since quitting: 25.2  Smokeless Tobacco Never    Goals Met:  Proper associated with RPD/PD & O2 Sat Independence with exercise equipment Exercise tolerated well No report of concerns or symptoms today Strength training completed today  Goals Unmet:  Not Applicable  Comments: Service time is from 1307 to 1437.    Dr. Mechele Collin is Medical Director for Pulmonary Rehab at [facility].

## 2023-02-07 ENCOUNTER — Encounter (HOSPITAL_COMMUNITY)
Admission: RE | Admit: 2023-02-07 | Discharge: 2023-02-07 | Disposition: A | Discharge status: Home / Self Care | Payer: No Typology Code available for payment source | Source: Ambulatory Visit | Attending: Pulmonary Disease | Admitting: Pulmonary Disease

## 2023-02-07 ENCOUNTER — Encounter (HOSPITAL_COMMUNITY): Payer: Self-pay

## 2023-02-07 VITALS — Wt 258.8 lb

## 2023-02-07 DIAGNOSIS — J9611 Chronic respiratory failure with hypoxia: Secondary | ICD-10-CM | POA: Diagnosis present

## 2023-02-07 NOTE — Progress Notes (Signed)
Daily Session Note  Patient Details  Name: PAVIELLE BIGGAR MRN: [id number] Date of Birth: 10-28-1965 Referring Provider:   Doristine Devoid Pulmonary Rehab Walk Test from 01/06/2023 in [facility], [facility]  Referring Provider [name]       Encounter Date: 02/07/2023  Check In:  Session Check In - 02/07/23 1334       Check-In   Supervising physician immediately available to respond to emergencies [facility] MD immediately available    Physician(s) Robin Searing, NP    [facility]    Staff Present Essie Hart, RN, Doris Cheadle, MS, ACSM-CEP, Exercise Physiologist;Casey Erin Sons BS, ACSM-CEP, Exercise Physiologist;Samantha Belarus, RD, LDN    Virtual Visit No    Medication changes reported     No    Fall or balance concerns reported    No    Tobacco Cessation No Change    Warm-up and Cool-down Performed as group-led instruction   Orientation   Resistance Training Performed Yes    VAD Patient? No    PAD/SET Patient? No      Pain Assessment   Currently in Pain? No/denies             Capillary Blood Glucose: No results found for this or any previous visit (from the past 24 hour(s)).   Exercise Prescription Changes - 02/07/23 1500       Response to Exercise   Blood Pressure (Admit) 138/60    Blood Pressure (Exercise) 142/74    Blood Pressure (Exit) 102/64    Heart Rate (Admit) 75 bpm    Heart Rate (Exercise) 92 bpm    Heart Rate (Exit) 78 bpm    Oxygen Saturation (Admit) 96 %    Oxygen Saturation (Exercise) 92 %    Oxygen Saturation (Exit) 95 %    Rating of Perceived Exertion (Exercise) 13    Perceived Dyspnea (Exercise) 1    Duration Continue with 30 min of aerobic exercise without signs/symptoms of physical distress.    Intensity THRR unchanged      Progression   Progression Continue to progress workloads to maintain intensity without signs/symptoms of physical distress.      Resistance  Training   Training Prescription Yes    Weight blue bands    Reps 10-15    Time 10 Minutes      Oxygen   Oxygen Continuous    Liters 2      Treadmill   MPH 2    Grade 1    Minutes 5    METs 2.81      Recumbant Elliptical   Level 2    Minutes 15    METs 1.9      Oxygen   Maintain Oxygen Saturation 88% or higher             Social History   Tobacco Use  Smoking Status Former   Current packs/day: 0.00   Types: Cigarettes   Start date: 11/24/1982   Quit date: 11/23/1997   Years since quitting: 25.2  Smokeless Tobacco Never    Goals Met:  Independence with exercise equipment Strength training completed today  Goals Unmet:  BS dropped to 93, 4oz juice given , still decreasing BS 88, 2nd 4oz juice given. BS came up to 129  Comments: Service time is from 1308 to 1435    Dr. Mechele Collin is Medical Director for Pulmonary Rehab at [facility].

## 2023-02-08 NOTE — Progress Notes (Signed)
Pulmonary Individual Treatment Plan  Patient Details  Name: Veronica Rivers MRN: [id number] Date of Birth: 1966/05/01 Referring Provider:   Doristine Devoid Pulmonary Rehab Walk Test from 01/06/2023 in [facility]  Referring Provider [name]       Initial Encounter Date:  Flowsheet Row Pulmonary Rehab Walk Test from 01/06/2023 in [facility]  Date 01/06/23       Visit Diagnosis: Chronic respiratory failure with hypoxia (HCC)  Patient's Home Medications on Admission:   Current Outpatient Medications:    albuterol (VENTOLIN HFA) 108 (90 Base) MCG/ACT inhaler, Inhale 1-2 puffs into the lungs every 4 (four) hours as needed for wheezing or shortness of breath., Disp: 18 g, Rfl: 3   ARIPiprazole (ABILIFY) 15 MG tablet, Take 15 mg by mouth daily., Disp: , Rfl:    budesonide-formoterol (SYMBICORT) 160-4.5 MCG/ACT inhaler, Inhale 2 puffs into the lungs 2 (two) times daily., Disp: 1 each, Rfl: 12   carvedilol (COREG) 12.5 MG tablet, Take 1 tablet (12.5 mg total) by mouth 2 (two) times daily with a meal., Disp: 180 tablet, Rfl: 3   Cholecalciferol (VITAMIN D3) 125 MCG (5000 UT) CAPS, Take 5,000 Units by mouth daily., Disp: , Rfl:    diclofenac Sodium (VOLTAREN) 1 % GEL, Apply 2 g topically 4 (four) times daily as needed (knee pain)., Disp: , Rfl:    DULOXETINE HCL PO, Take 120 mg by mouth daily at 6 (six) AM. (Patient not taking: Reported on 01/06/2023), Disp: , Rfl:    famotidine (PEPCID) 20 MG tablet, Take 20 mg by mouth daily., Disp: , Rfl:    fluticasone (FLONASE) 50 MCG/ACT nasal spray, Place 1 spray into both nostrils daily as needed for allergies., Disp: 15.8 mL, Rfl: 3   furosemide (LASIX) 20 MG tablet, Take 1 tablet (20 mg total) by mouth daily., Disp: 90 tablet, Rfl: 3   gabapentin (NEURONTIN) 300 MG capsule, Take 300 mg by mouth 2 (two) times daily., Disp: , Rfl:     hydrocortisone cream 0.5 %, Apply 1 application  topically 2 (two) times daily., Disp: , Rfl:    insulin lispro (HUMALOG) 100 UNIT/ML injection, Inject into the skin 3 (three) times daily before meals., Disp: , Rfl:    lamoTRIgine (LAMICTAL) 200 MG tablet, Take 200 mg by mouth daily., Disp: , Rfl:    levocetirizine (XYZAL) 5 MG tablet, TAKE ONE TABLET BY MOUTH ONE TIME A DAY, Disp: 30 tablet, Rfl: 6   levothyroxine (SYNTHROID) 125 MCG tablet, Take 125 mcg by mouth at bedtime., Disp: , Rfl:    montelukast (SINGULAIR) 10 MG tablet, TAKE 1 TABLET BY MOUTH AT BEDTIME, Disp: 90 tablet, Rfl: 1   NEOMYCIN-POLYMYXIN-HYDROCORTISONE (CORTISPORIN) 1 % SOLN OTIC solution, Apply 1-2 drops to toe BID after soaking, Disp: 10 mL, Rfl: 1   nitroGLYCERIN (NITROSTAT) 0.4 MG SL tablet, Place 1 tablet (0.4 mg total) under the tongue every 5 (five) minutes as needed for chest pain., Disp: 25 tablet, Rfl: 3   nystatin (MYCOSTATIN) 100000 UNIT/ML suspension, Take 5 mLs (500,000 Units total) by mouth 2 (two) times daily., Disp: 120 mL, Rfl: 1   ONE TOUCH ULTRA TEST test strip, Inject 1 strip into the skin daily., Disp: , Rfl: 3   oxyCODONE-acetaminophen (PERCOCET) 10-325 MG tablet, Take 1 tablet by mouth 5 (five) times daily. As needed for pain, Disp: , Rfl:    PROMETHEGAN 12.5 MG suppository, Place 12.5  mg rectally 2 (two) times daily as needed for nausea or vomiting., Disp: , Rfl:    QUEtiapine (SEROQUEL) 400 MG tablet, Take 400 mg by mouth at bedtime., Disp: , Rfl:    SYNVISC 16 MG/2ML SOSY, , Disp: , Rfl:   Past Medical History: Past Medical History:  Diagnosis Date   Asthma    Bipolar depression (HCC)    Cellulitis    CHEST PAIN    DM    DYSPNEA    Emphysema of lung (HCC)    HYPERCHOLESTEROLEMIA    HYPERLIPIDEMIA    HYPOTHYROIDISM    Neuropathy    SUPRAVENTRICULAR TACHYCARDIA     Tobacco Use: Social History   Tobacco Use  Smoking Status Former   Current packs/day: 0.00   Types: Cigarettes   Start  date: 11/24/1982   Quit date: 11/23/1997   Years since quitting: 25.2  Smokeless Tobacco Never    Labs: Review Flowsheet       Latest Ref Rng & Units 11/23/2012 07/02/2014 12/19/2018  Labs for ITP Cardiac and Pulmonary Rehab  Cholestrol 100 - 199 mg/dL - 409  811   LDL (calc) 0 - 99 mg/dL - 70  914   HDL-C >78 mg/dL - 29.56  65   Trlycerides 0 - 149 mg/dL - 213.0  865   Hemoglobin A1c <5.7 % 8.0  - -    Details            Capillary Blood Glucose: Lab Results  Component Value Date   GLUCAP 138 (H) 01/26/2023   GLUCAP 195 (H) 01/26/2023   GLUCAP 188 (H) 01/26/2023   GLUCAP 120 (H) 01/12/2023   GLUCAP 51 (L) 01/12/2023     Pulmonary Assessment Scores:  Pulmonary Assessment Scores     Row Name 01/06/23 1040         ADL UCSD   ADL Phase Entry     SOB Score total 48       CAT Score   CAT Score 13       mMRC Score   mMRC Score 2             UCSD: Self-administered rating of dyspnea associated with activities of daily living (ADLs) 6-point scale (0 = "not at all" to 5 = "maximal or unable to do because of breathlessness")  Scoring Scores range from 0 to 120.  Minimally important difference is 5 units  CAT: CAT can identify the health impairment of COPD patients and is better correlated with disease progression.  CAT has a scoring range of zero to 40. The CAT score is classified into four groups of low (less than 10), medium (10 - 20), high (21-30) and very high (31-40) based on the impact level of disease on health status. A CAT score over 10 suggests significant symptoms.  A worsening CAT score could be explained by an exacerbation, poor medication adherence, poor inhaler technique, or progression of COPD or comorbid conditions.  CAT MCID is 2 points  mMRC: mMRC (Modified Medical Research Council) Dyspnea Scale is used to assess the degree of baseline functional disability in patients of respiratory disease due to dyspnea. No minimal important difference is  established. A decrease in score of 1 point or greater is considered a positive change.   Pulmonary Function Assessment:  Pulmonary Function Assessment - 01/06/23 0938       Breath   Bilateral Breath Sounds Decreased    Shortness of Breath Yes;Fear of Shortness of Breath;Panic with Shortness of  Breath;Limiting activity             Exercise Target Goals: Exercise Program Goal: Individual exercise prescription set using results from initial 6 min walk test and THRR while considering  patient's activity barriers and safety.   Exercise Prescription Goal: Initial exercise prescription builds to 30-45 minutes a day of aerobic activity, 2-3 days per week.  Home exercise guidelines will be given to patient during program as part of exercise prescription that the participant will acknowledge.  Activity Barriers & Risk Stratification:  Activity Barriers & Cardiac Risk Stratification - 01/06/23 0935       Activity Barriers & Cardiac Risk Stratification   Activity Barriers Deconditioning;Muscular Weakness;Shortness of Breath;Other (comment);Back Problems;Arthritis    Comments bilateral knee pain, needs bilateral knee replacements             6 Minute Walk:  6 Minute Walk     Row Name 01/06/23 1052         6 Minute Walk   Phase Initial     Distance 857 feet     Walk Time 6 minutes     # of Rest Breaks 0     MPH 1.62     METS 1.99     RPE 7     Perceived Dyspnea  1     VO2 Peak 6.97     Symptoms No     Resting HR 72 bpm     Resting BP 108/68     Resting Oxygen Saturation  98 %     Exercise Oxygen Saturation  during 6 min walk 93 %     Max Ex. HR 97 bpm     Max Ex. BP 122/70     2 Minute Post BP 104/68       Interval HR   1 Minute HR 90     2 Minute HR 90     3 Minute HR 96     4 Minute HR 97     5 Minute HR 96     6 Minute HR 97     2 Minute Post HR 74     Interval Heart Rate? Yes       Interval Oxygen   Interval Oxygen? Yes     Baseline Oxygen Saturation  % 98 %     1 Minute Oxygen Saturation % 97 %     1 Minute Liters of Oxygen 2 L     2 Minute Oxygen Saturation % 94 %     2 Minute Liters of Oxygen 2 L     3 Minute Oxygen Saturation % 96 %     3 Minute Liters of Oxygen 2 L     4 Minute Oxygen Saturation % 96 %     4 Minute Liters of Oxygen 2 L     5 Minute Oxygen Saturation % 93 %     5 Minute Liters of Oxygen 2 L     6 Minute Oxygen Saturation % 96 %     6 Minute Liters of Oxygen 2 L     2 Minute Post Oxygen Saturation % 98 %     2 Minute Post Liters of Oxygen 2 L              Oxygen Initial Assessment:  Oxygen Initial Assessment - 01/06/23 1039       Home Oxygen   Home Oxygen Device Portable Concentrator    Sleep Oxygen Prescription Continuous  Liters per minute 2    Home Exercise Oxygen Prescription Pulsed    Liters per minute 2    Home Resting Oxygen Prescription Pulsed    Liters per minute 2    Compliance with Home Oxygen Use Yes      Initial 6 min Walk   Oxygen Used Continuous    Liters per minute 2      Program Oxygen Prescription   Program Oxygen Prescription Continuous    Liters per minute 2      Intervention   Short Term Goals To learn and exhibit compliance with exercise, home and travel O2 prescription;To learn and understand importance of maintaining oxygen saturations>88%;To learn and demonstrate proper use of respiratory medications;To learn and understand importance of monitoring SPO2 with pulse oximeter and demonstrate accurate use of the pulse oximeter.;To learn and demonstrate proper pursed lip breathing techniques or other breathing techniques.     Long  Term Goals Exhibits compliance with exercise, home  and travel O2 prescription;Verbalizes importance of monitoring SPO2 with pulse oximeter and return demonstration;Maintenance of O2 saturations>88%;Exhibits proper breathing techniques, such as pursed lip breathing or other method taught during program session;Compliance with respiratory  medication;Demonstrates proper use of MDI's             Oxygen Re-Evaluation:  Oxygen Re-Evaluation     Row Name 01/31/23 0938             Program Oxygen Prescription   Program Oxygen Prescription Continuous       Liters per minute 2         Home Oxygen   Home Oxygen Device Portable Concentrator       Sleep Oxygen Prescription Continuous       Liters per minute 2       Home Exercise Oxygen Prescription Pulsed       Liters per minute 2       Home Resting Oxygen Prescription Pulsed       Liters per minute 2       Compliance with Home Oxygen Use Yes         Goals/Expected Outcomes   Short Term Goals To learn and exhibit compliance with exercise, home and travel O2 prescription;To learn and understand importance of maintaining oxygen saturations>88%;To learn and demonstrate proper use of respiratory medications;To learn and understand importance of monitoring SPO2 with pulse oximeter and demonstrate accurate use of the pulse oximeter.;To learn and demonstrate proper pursed lip breathing techniques or other breathing techniques.        Long  Term Goals Exhibits compliance with exercise, home  and travel O2 prescription;Verbalizes importance of monitoring SPO2 with pulse oximeter and return demonstration;Maintenance of O2 saturations>88%;Exhibits proper breathing techniques, such as pursed lip breathing or other method taught during program session;Compliance with respiratory medication;Demonstrates proper use of MDI's       Goals/Expected Outcomes Compliance and understanding of oxygen saturation monitoring and breathing techniques to decrease shortness of breath                Oxygen Discharge (Final Oxygen Re-Evaluation):  Oxygen Re-Evaluation - 01/31/23 0938       Program Oxygen Prescription   Program Oxygen Prescription Continuous    Liters per minute 2      Home Oxygen   Home Oxygen Device Portable Concentrator    Sleep Oxygen Prescription Continuous    Liters per  minute 2    Home Exercise Oxygen Prescription Pulsed    Liters per minute 2  Home Resting Oxygen Prescription Pulsed    Liters per minute 2    Compliance with Home Oxygen Use Yes      Goals/Expected Outcomes   Short Term Goals To learn and exhibit compliance with exercise, home and travel O2 prescription;To learn and understand importance of maintaining oxygen saturations>88%;To learn and demonstrate proper use of respiratory medications;To learn and understand importance of monitoring SPO2 with pulse oximeter and demonstrate accurate use of the pulse oximeter.;To learn and demonstrate proper pursed lip breathing techniques or other breathing techniques.     Long  Term Goals Exhibits compliance with exercise, home  and travel O2 prescription;Verbalizes importance of monitoring SPO2 with pulse oximeter and return demonstration;Maintenance of O2 saturations>88%;Exhibits proper breathing techniques, such as pursed lip breathing or other method taught during program session;Compliance with respiratory medication;Demonstrates proper use of MDI's    Goals/Expected Outcomes Compliance and understanding of oxygen saturation monitoring and breathing techniques to decrease shortness of breath             Initial Exercise Prescription:  Initial Exercise Prescription - 01/06/23 1100       Date of Initial Exercise RX and Referring Provider   Date 01/06/23    Referring Provider [name]    Expected Discharge Date 03/30/23      Oxygen   Oxygen Continuous    Liters 2    Maintain Oxygen Saturation 88% or higher      Treadmill   MPH 1.5    Grade 0    Minutes 15    METs 2.15      Recumbant Elliptical   Level 1    RPM 40    Watts 20    Minutes 15      Prescription Details   Frequency (times per week) 2    Duration Progress to 30 minutes of continuous aerobic without signs/symptoms of physical distress      Intensity   THRR 40-80% of Max Heartrate 65-131    Ratings of Perceived Exertion  11-13    Perceived Dyspnea 0-4      Progression   Progression Continue to progress workloads to maintain intensity without signs/symptoms of physical distress.      Resistance Training   Training Prescription Yes    Weight blue bands    Reps 10-15             Perform Capillary Blood Glucose checks as needed.  Exercise Prescription Changes:   Exercise Prescription Changes     Row Name 01/10/23 1500 01/12/23 1504 02/07/23 1500         Response to Exercise   Blood Pressure (Admit) 130/72 102/70 138/60     Blood Pressure (Exercise) 142/78 -- 142/74     Blood Pressure (Exit) 110/60 106/58 102/64     Heart Rate (Admit) 82 bpm 87 bpm 75 bpm     Heart Rate (Exercise) 100 bpm 96 bpm 92 bpm     Heart Rate (Exit) 83 bpm 75 bpm 78 bpm     Oxygen Saturation (Admit) 94 % 96 % 96 %     Oxygen Saturation (Exercise) 94 % 95 % 92 %     Oxygen Saturation (Exit) 95 % 94 % 95 %     Rating of Perceived Exertion (Exercise) 13 15 13      Perceived Dyspnea (Exercise) 2 2 1      Duration Progress to 30 minutes of  aerobic without signs/symptoms of physical distress Continue with 30 min of aerobic exercise without signs/symptoms  of physical distress. Continue with 30 min of aerobic exercise without signs/symptoms of physical distress.     Intensity THRR unchanged THRR unchanged THRR unchanged       Progression   Progression Continue to progress workloads to maintain intensity without signs/symptoms of physical distress. Continue to progress workloads to maintain intensity without signs/symptoms of physical distress. Continue to progress workloads to maintain intensity without signs/symptoms of physical distress.       Resistance Training   Training Prescription Yes Yes Yes     Weight blue bands blue bands blue bands     Reps 10-15 10-15 10-15     Time 10 Minutes 10 Minutes 10 Minutes       Oxygen   Oxygen Continuous Continuous Continuous     Liters 2 2 2        Treadmill   MPH 1.5 1.5 2      Grade 0 0 1     Minutes 15 15 5      METs 2.15 2.15 2.81       Recumbant Elliptical   Level 1 1 2      Minutes 15 15 15      METs 1 1.7 1.9       Oxygen   Maintain Oxygen Saturation 88% or higher 88% or higher 88% or higher              Exercise Comments:   Exercise Comments     Row Name 01/10/23 1529           Exercise Comments Pt completed first day of group exercise. She exercised 15 min on the recumbent elliptical at level 1, METs 1.0. She then walked on the treadmill at 1.5 mph, incline 0, METs 2.15. Tolerated well overall. Performed warm up and cool down standing, practiced sit to stands in place of squats. Discussed METs with good reception.                Exercise Goals and Review:   Exercise Goals     Row Name 01/06/23 0936             Exercise Goals   Increase Physical Activity Yes       Intervention Provide advice, education, support and counseling about physical activity/exercise needs.;Develop an individualized exercise prescription for aerobic and resistive training based on initial evaluation findings, risk stratification, comorbidities and participant's personal goals.       Expected Outcomes Short Term: Attend rehab on a regular basis to increase amount of physical activity.;Long Term: Exercising regularly at least 3-5 days a week.;Long Term: Add in home exercise to make exercise part of routine and to increase amount of physical activity.       Increase Strength and Stamina Yes       Intervention Provide advice, education, support and counseling about physical activity/exercise needs.;Develop an individualized exercise prescription for aerobic and resistive training based on initial evaluation findings, risk stratification, comorbidities and participant's personal goals.       Expected Outcomes Short Term: Increase workloads from initial exercise prescription for resistance, speed, and METs.;Short Term: Perform resistance training exercises  routinely during rehab and add in resistance training at home;Long Term: Improve cardiorespiratory fitness, muscular endurance and strength as measured by increased METs and functional capacity ( )       Able to understand and use rate of perceived exertion (RPE) scale Yes       Intervention Provide education and explanation on how to use RPE scale  Expected Outcomes Short Term: Able to use RPE daily in rehab to express subjective intensity level;Long Term:  Able to use RPE to guide intensity level when exercising independently       Able to understand and use Dyspnea scale Yes       Intervention Provide education and explanation on how to use Dyspnea scale       Expected Outcomes Short Term: Able to use Dyspnea scale daily in rehab to express subjective sense of shortness of breath during exertion;Long Term: Able to use Dyspnea scale to guide intensity level when exercising independently       Knowledge and understanding of Target Heart Rate Range (THRR) Yes       Intervention Provide education and explanation of THRR including how the numbers were predicted and where they are located for reference       Expected Outcomes Short Term: Able to state/look up THRR;Long Term: Able to use THRR to govern intensity when exercising independently;Short Term: Able to use daily as guideline for intensity in rehab       Understanding of Exercise Prescription Yes       Intervention Provide education, explanation, and written materials on patient's individual exercise prescription       Expected Outcomes Short Term: Able to explain program exercise prescription;Long Term: Able to explain home exercise prescription to exercise independently                Exercise Goals Re-Evaluation :  Exercise Goals Re-Evaluation     Row Name 01/31/23 0935             Exercise Goal Re-Evaluation   Exercise Goals Review Increase Physical Activity;Able to understand and use Dyspnea scale;Understanding of  Exercise Prescription;Increase Strength and Stamina;Knowledge and understanding of Target Heart Rate Range (THRR);Able to understand and use rate of perceived exertion (RPE) scale       Comments Pt has completed 3 group exercise sessions. She has missed 3 sessions due to illness. She is exercising 15 min on the recumbent elliptical at level 1, METs 1.8. She then is walking on the treadmill at 1.9 mph, incline 0, METs 2.45. She is tolerating fairly well, will try to increase workload today. Performs sit to stands instead of squats.       Expected Outcomes Through exercise at rehab and home, the patient will decrease shortness of breath with daily activities and feel confident in carrying out an exercise regimen at home                Discharge Exercise Prescription (Final Exercise Prescription Changes):  Exercise Prescription Changes - 02/07/23 1500       Response to Exercise   Blood Pressure (Admit) 138/60    Blood Pressure (Exercise) 142/74    Blood Pressure (Exit) 102/64    Heart Rate (Admit) 75 bpm    Heart Rate (Exercise) 92 bpm    Heart Rate (Exit) 78 bpm    Oxygen Saturation (Admit) 96 %    Oxygen Saturation (Exercise) 92 %    Oxygen Saturation (Exit) 95 %    Rating of Perceived Exertion (Exercise) 13    Perceived Dyspnea (Exercise) 1    Duration Continue with 30 min of aerobic exercise without signs/symptoms of physical distress.    Intensity THRR unchanged      Progression   Progression Continue to progress workloads to maintain intensity without signs/symptoms of physical distress.      Resistance Training   Training Prescription Yes  Weight blue bands    Reps 10-15    Time 10 Minutes      Oxygen   Oxygen Continuous    Liters 2      Treadmill   MPH 2    Grade 1    Minutes 5    METs 2.81      Recumbant Elliptical   Level 2    Minutes 15    METs 1.9      Oxygen   Maintain Oxygen Saturation 88% or higher             Nutrition:  Target Goals:  Understanding of nutrition guidelines, daily intake of sodium 1500mg , cholesterol 200mg , calories 30% from fat and 7% or less from saturated fats, daily to have 5 or more servings of fruits and vegetables.  Biometrics:  Pre Biometrics - 01/06/23 0917       Pre Biometrics   Grip Strength 35 kg              Nutrition Therapy Plan and Nutrition Goals:  Nutrition Therapy & Goals - 02/07/23 1423       Nutrition Therapy   Diet Heart Healthy/Carbohydrate Consistent Diet      Personal Nutrition Goals   Nutrition Goal Patient to improve diet quality by using the plate method as a guide for meal planning to include lean protein/plant protein, fruits, vegetables, whole grains, nonfat dairy as part of a well-balanced diet.    Personal Goal #2 Patient to identify strategies for weight loss of 0.5-2.0# per week.    Comments Goals in action. Jamayah reports motivation to lose ~50#; she reports her highest weight was 271#. She is down 7# since starting with our program. She does report that she has received some nutrition counseling through her therapist. We have discussed multiple strategies for weight loss including the plate method as a guide for meal planning, calorie density, benefits of high fiber/high protein intake, etc. Reviewed the 15 rule for treating low blood sugars and benefits of carbohydrate consistency. Her husband is a good support. Patient will benefit from participation in pulmonary rehab for nutrition, exercise, and lifestyle modification.      Intervention Plan   Intervention Prescribe, educate and counsel regarding individualized specific dietary modifications aiming towards targeted core components such as weight, hypertension, lipid management, diabetes, heart failure and other comorbidities.;Nutrition handout(s) given to patient.    Expected Outcomes Short Term Goal: Understand basic principles of dietary content, such as calories, fat, sodium, cholesterol and nutrients.;Long  Term Goal: Adherence to prescribed nutrition plan.             Nutrition Assessments:  Nutrition Assessments - 01/12/23 0826       Rate Your Plate Scores   Pre Score 62            MEDIFICTS Score Key: >=70 Need to make dietary changes  40-70 Heart Healthy Diet <= 40 Therapeutic Level Cholesterol Diet  Flowsheet Row PULMONARY REHAB OTHER RESPIRATORY from 01/10/2023 in [facility]  Picture Your Plate Total Score on Admission 62      Picture Your Plate Scores: <16 Unhealthy dietary pattern with much room for improvement. 41-50 Dietary pattern unlikely to meet recommendations for good health and room for improvement. 51-60 More healthful dietary pattern, with some room for improvement.  >60 Healthy dietary pattern, although there may be some specific behaviors that could be improved.    Nutrition Goals Re-Evaluation:  Nutrition  Goals Re-Evaluation     Row Name 01/10/23 1417 02/07/23 1423           Goals   Current Weight 263 lb 10.7 oz (119.6 kg) 258 lb 13.1 oz (117.4 kg)      Comment A1c, lipid panel available through [facility]. Labs not available in Mychart. A1c, lipid panel available through [facility] [facility]. Labs not available in Mychart.      Expected Outcome Cadince reports motivation to lose ~50#; she reports her highest weight was 271#. She does report that she has received some nutrition counseling through her therapist. Her husband is a good support. Patient will benefit from participation in pulmonary rehab for nutrition, exercise, and lifestyle modification. Goals in action. Ramsie reports motivation to lose ~50#; she reports her highest weight was 271#. She is down 7# since starting with our program. She does report that she has received some nutrition counseling through her therapist. We have discussed multiple strategies for weight loss including the plate method as a guide for meal planning, calorie density, benefits of  high fiber/high protein intake, etc. Reviewed the 15 rule for treating low blood sugars and benefits of carbohydrate consistency. Her husband is a good support. Patient will benefit from participation in pulmonary rehab for nutrition, exercise, and lifestyle modification.               Nutrition Goals Discharge (Final Nutrition Goals Re-Evaluation):  Nutrition Goals Re-Evaluation - 02/07/23 1423       Goals   Current Weight 258 lb 13.1 oz (117.4 kg)    Comment A1c, lipid panel available through [facility]. Labs not available in Mychart.    Expected Outcome Goals in action. Illiana reports motivation to lose ~50#; she reports her highest weight was 271#. She is down 7# since starting with our program. She does report that she has received some nutrition counseling through her therapist. We have discussed multiple strategies for weight loss including the plate method as a guide for meal planning, calorie density, benefits of high fiber/high protein intake, etc. Reviewed the 15 rule for treating low blood sugars and benefits of carbohydrate consistency. Her husband is a good support. Patient will benefit from participation in pulmonary rehab for nutrition, exercise, and lifestyle modification.             Psychosocial: Target Goals: Acknowledge presence or absence of significant depression and/or stress, maximize coping skills, provide positive support system. Participant is able to verbalize types and ability to use techniques and skills needed for reducing stress and depression.  Initial Review & Psychosocial Screening:  Initial Psych Review & Screening - 01/06/23 0929       Initial Review   Current issues with Current Depression;Current Psychotropic Meds;Current Anxiety/Panic    Comments Pt is on treatment for her anxiety and depressions. She feels this is helping her to a degree. Offered support as Clora well let us know if she needs any other resources.      Family Dynamics   Good Support  System? Yes    Comments spouse and daughter      Barriers   Psychosocial barriers to participate in program The patient should benefit from training in stress management and relaxation.      Screening Interventions   Interventions Encouraged to exercise;To provide support and resources with identified psychosocial needs    Expected Outcomes Short Term goal: Utilizing psychosocial counselor, staff and physician to assist with identification of specific Stressors or current issues interfering with healing process. Setting desired  goal for each stressor or current issue identified.;Long Term Goal: Stressors or current issues are controlled or eliminated.;Short Term goal: Identification and review with participant of any Quality of Life or Depression concerns found by scoring the questionnaire.;Long Term goal: The participant improves quality of Life and PHQ9 Scores as seen by post scores and/or verbalization of changes             Quality of Life Scores:  Scores of 19 and below usually indicate a poorer quality of life in these areas.  A difference of  2-3 points is a clinically meaningful difference.  A difference of 2-3 points in the total score of the Quality of Life Index has been associated with significant improvement in overall quality of life, self-image, physical symptoms, and general health in studies assessing change in quality of life.  PHQ-9: Review Flowsheet       01/06/2023 04/20/2016  Depression screen PHQ 2/9  Decreased Interest 1 0  Down, Depressed, Hopeless 2 0  PHQ - 2 Score 3 0  Altered sleeping 2 -  Tired, decreased energy 3 -  Change in appetite 1 -  Feeling bad or failure about yourself  2 -  Trouble concentrating 1 -  Moving slowly or fidgety/restless 0 -  Suicidal thoughts 1 -  PHQ-9 Score 13 -  Difficult doing work/chores Somewhat difficult -    Details           Interpretation of Total Score  Total Score Depression Severity:  1-4 = Minimal  depression, 5-9 = Mild depression, 10-14 = Moderate depression, 15-19 = Moderately severe depression, 20-27 = Severe depression   Psychosocial Evaluation and Intervention:  Psychosocial Evaluation - 01/06/23 0939       Psychosocial Evaluation & Interventions   Interventions Encouraged to exercise with the program and follow exercise prescription    Comments Pt is on treatment for anxiety and depression.    Expected Outcomes For Marletta to participate in PR free of psychosocial barriers.    Continue Psychosocial Services  No Follow up required             Psychosocial Re-Evaluation:  Psychosocial Re-Evaluation     Row Name 01/11/23 0852 02/03/23 1028           Psychosocial Re-Evaluation   Current issues with Current Depression;Current Psychotropic Meds;Current Anxiety/Panic Current Depression;Current Psychotropic Meds;Current Anxiety/Panic      Comments No changes in Vernia's psychosocial eval. Nyemah has completed 1 session so far. Winston stated she is bipolar and is still trying to recover from a mental health breakdown last December. She states that she sees a psychiatrist who has recently increased her medication dosages. She stated she also see a therapist regularly. Pt denies any thoughts of suicidal ideation. Pt denies any referrals or assistance at this time. Nashika states she feels her depression is controlled at this time. She is currently working with her psychiatrist and therapist. She denies any new psychosocial barriers or concerns at this time.      Expected Outcomes For Esthefany to continue to attend pulmonary rehab and to have a positive outlook and good coping skills to manage her depression, anxiety & panic. For Teriana to continue to attend pulmonary rehab and to have a positive outlook and good coping skills to manage her depression, anxiety & panic.      Interventions Encouraged to attend Pulmonary Rehabilitation for the exercise Encouraged to attend Pulmonary Rehabilitation for the  exercise      Continue Psychosocial  Services  Follow up required by staff Follow up required by staff               Psychosocial Discharge (Final Psychosocial Re-Evaluation):  Psychosocial Re-Evaluation - 02/03/23 1028       Psychosocial Re-Evaluation   Current issues with Current Depression;Current Psychotropic Meds;Current Anxiety/Panic    Comments Haeli states she feels her depression is controlled at this time. She is currently working with her psychiatrist and therapist. She denies any new psychosocial barriers or concerns at this time.    Expected Outcomes For [place] to continue to attend pulmonary rehab and to have a positive outlook and good coping skills to manage her depression, anxiety & panic.    Interventions Encouraged to attend Pulmonary Rehabilitation for the exercise    Continue Psychosocial Services  Follow up required by staff             Education: Education Goals: Education classes will be provided on a weekly basis, covering required topics. Participant will state understanding/return demonstration of topics presented.  Learning Barriers/Preferences:  Learning Barriers/Preferences - 01/06/23 1047       Learning Barriers/Preferences   Learning Barriers Sight    Learning Preferences None             Education Topics: Know Your Numbers Group instruction that is supported by a PowerPoint presentation. Instructor discusses importance of knowing and understanding resting, exercise, and post-exercise oxygen saturation, heart rate, and blood pressure. Oxygen saturation, heart rate, blood pressure, rating of perceived exertion, and dyspnea are reviewed along with a normal range for these values.    Exercise for the Pulmonary Patient Group instruction that is supported by a PowerPoint presentation. Instructor discusses benefits of exercise, core components of exercise, frequency, duration, and intensity of an exercise routine, importance of utilizing pulse  oximetry during exercise, safety while exercising, and options of places to exercise outside of rehab.  Flowsheet Row PULMONARY REHAB OTHER RESPIRATORY from 02/02/2023 in [facility] for Heart, Vascular, & Lung Health  Date 02/02/23  Educator EP  Instruction Review Code 1- Verbalizes Understanding       MET Level  Group instruction provided by PowerPoint, verbal discussion, and written material to support subject matter. Instructor reviews what METs are and how to increase METs.    Pulmonary Medications Verbally interactive group education provided by instructor with focus on inhaled medications and proper administration.   Anatomy and Physiology of the Respiratory System Group instruction provided by PowerPoint, verbal discussion, and written material to support subject matter. Instructor reviews respiratory cycle and anatomical components of the respiratory system and their functions. Instructor also reviews differences in obstructive and restrictive respiratory diseases with examples of each.    Oxygen Safety Group instruction provided by PowerPoint, verbal discussion, and written material to support subject matter. There is an overview of "What is Oxygen" and "Why do we need it".  Instructor also reviews how to create a safe environment for oxygen use, the importance of using oxygen as prescribed, and the risks of noncompliance. There is a brief discussion on traveling with oxygen and resources the patient may utilize.   Oxygen Use Group instruction provided by PowerPoint, verbal discussion, and written material to discuss how supplemental oxygen is prescribed and different types of oxygen supply systems. Resources for more information are provided.    Breathing Techniques Group instruction that is supported by demonstration and informational handouts. Instructor discusses the benefits of pursed lip and diaphragmatic breathing and detailed demonstration  on how  to perform both.     Risk Factor Reduction Group instruction that is supported by a PowerPoint presentation. Instructor discusses the definition of a risk factor, different risk factors for pulmonary disease, and how the heart and lungs work together.   Pulmonary Diseases Group instruction provided by PowerPoint, verbal discussion, and written material to support subject matter. Instructor gives an overview of the different type of pulmonary diseases. There is also a discussion on risk factors and symptoms as well as ways to manage the diseases.   Stress and Energy Conservation Group instruction provided by PowerPoint, verbal discussion, and written material to support subject matter. Instructor gives an overview of stress and the impact it can have on the body. Instructor also reviews ways to reduce stress. There is also a discussion on energy conservation and ways to conserve energy throughout the day.   Warning Signs and Symptoms Group instruction provided by PowerPoint, verbal discussion, and written material to support subject matter. Instructor reviews warning signs and symptoms of stroke, heart attack, cold and flu. Instructor also reviews ways to prevent the spread of infection.   Other Education Group or individual verbal, written, or video instructions that support the educational goals of the pulmonary rehab program. Flowsheet Row PULMONARY REHAB OTHER RESPIRATORY from 01/12/2023 in [facility] for Heart, Vascular, & Lung Health  Date 01/12/23  Educator RT  Instruction Review Code 1- Verbalizes Understanding        Knowledge Questionnaire Score:  Knowledge Questionnaire Score - 01/06/23 1047       Knowledge Questionnaire Score   Pre Score 16/18             Core Components/Risk Factors/Patient Goals at Admission:  Personal Goals and Risk Factors at Admission - 01/06/23 0940       Core Components/Risk Factors/Patient Goals on Admission     Weight Management Weight Loss;Yes    Intervention Weight Management: Develop a combined nutrition and exercise program designed to reach desired caloric intake, while maintaining appropriate intake of nutrient and fiber, sodium and fats, and appropriate energy expenditure required for the weight goal.;Weight Management: Provide education and appropriate resources to help participant work on and attain dietary goals.;Weight Management/Obesity: Establish reasonable short term and long term weight goals.;Obesity: Provide education and appropriate resources to help participant work on and attain dietary goals.    Expected Outcomes Short Term: Continue to assess and modify interventions until short term weight is achieved;Long Term: Adherence to nutrition and physical activity/exercise program aimed toward attainment of established weight goal;Weight Maintenance: Understanding of the daily nutrition guidelines, which includes 25-35% calories from fat, 7% or less cal from saturated fats, less than 200mg  cholesterol, less than 1.5gm of sodium, & 5 or more servings of fruits and vegetables daily;Weight Loss: Understanding of general recommendations for a balanced deficit meal plan, which promotes 1-2 lb weight loss per week and includes a negative energy balance of [phone] kcal/d;Understanding recommendations for meals to include 15-35% energy as protein, 25-35% energy from fat, 35-60% energy from carbohydrates, less than 200mg  of dietary cholesterol, 20-35 gm of total fiber daily;Understanding of distribution of calorie intake throughout the day with the consumption of 4-5 meals/snacks    Improve shortness of breath with ADL's Yes    Intervention Provide education, individualized exercise plan and daily activity instruction to help decrease symptoms of SOB with activities of daily living.    Expected Outcomes Short Term: Improve cardiorespiratory fitness to achieve a reduction of symptoms when  performing ADLs;Long  Term: Be able to perform more ADLs without symptoms or delay the onset of symptoms             Core Components/Risk Factors/Patient Goals Review:   Goals and Risk Factor Review     Row Name 01/11/23 1224 02/03/23 1036           Core Components/Risk Factors/Patient Goals Review   Personal Goals Review Weight Management/Obesity;Improve shortness of breath with ADL's;Develop more efficient breathing techniques such as purse lipped breathing and diaphragmatic breathing and practicing self-pacing with activity. Weight Management/Obesity;Improve shortness of breath with ADL's;Develop more efficient breathing techniques such as purse lipped breathing and diaphragmatic breathing and practicing self-pacing with activity.      Review Unable to assess goals yet. Ebone has completed 1 class so far. Goal progressing for weight loss. Gracelee is working with our dietician for weight loss goals. Goal progressing on improving her shortness of breath with ADLs. Goal progressing on developing more efficient breathing techniques such as purse lipped breathing and diaphragmatic breathing; and practicing self-pacing with activity. Kiala has to be reminded to use purse lip breathing when she gets SOB.      Expected Outcomes For Yatzary to lose weight, improve her shortness of breath with ADLs, and devlop more efficient breathing techniques such as purse lipped breathing and diaphragmatic breathing; and practicing self-pacing with activity For Fallan to lose weight, improve her shortness of breath with ADLs, and devlop more efficient breathing techniques such as purse lipped breathing and diaphragmatic breathing; and practicing self-pacing with activity               Core Components/Risk Factors/Patient Goals at Discharge (Final Review):   Goals and Risk Factor Review - 02/03/23 1036       Core Components/Risk Factors/Patient Goals Review   Personal Goals Review Weight Management/Obesity;Improve shortness of breath  with ADL's;Develop more efficient breathing techniques such as purse lipped breathing and diaphragmatic breathing and practicing self-pacing with activity.    Review Goal progressing for weight loss. Davida is working with our dietician for weight loss goals. Goal progressing on improving her shortness of breath with ADLs. Goal progressing on developing more efficient breathing techniques such as purse lipped breathing and diaphragmatic breathing; and practicing self-pacing with activity. Milah has to be reminded to use purse lip breathing when she gets SOB.    Expected Outcomes For Rozena to lose weight, improve her shortness of breath with ADLs, and devlop more efficient breathing techniques such as purse lipped breathing and diaphragmatic breathing; and practicing self-pacing with activity             ITP Comments:Pt is making expected progress toward Pulmonary Rehab goals after completing 6 sessions. Recommend continued exercise, life style modification, education, and utilization of breathing techniques to increase stamina and strength, while also decreasing shortness of breath with exertion.  Dr. Mechele Collin is Medical Director for Pulmonary Rehab at [facility].     Comments: Dr. Mechele Collin is Medical Director for Pulmonary Rehab at [facility].

## 2023-02-09 ENCOUNTER — Encounter (HOSPITAL_COMMUNITY)
Admission: RE | Admit: 2023-02-09 | Discharge: 2023-02-09 | Disposition: A | Discharge status: Home / Self Care | Payer: No Typology Code available for payment source | Source: Ambulatory Visit | Attending: Pulmonary Disease | Admitting: Pulmonary Disease

## 2023-02-09 DIAGNOSIS — J9611 Chronic respiratory failure with hypoxia: Secondary | ICD-10-CM | POA: Diagnosis not present

## 2023-02-09 NOTE — Progress Notes (Signed)
Daily Session Note  Patient Details  Name: Veronica Rivers MRN: [id number] Date of Birth: 14-Oct-1965 Referring Provider:   Doristine Devoid Pulmonary Rehab Walk Test from 01/06/2023 in [facility] for Heart, Vascular, & Lung Health  Referring Provider [name]       Encounter Date: 02/09/2023  Check In:  Session Check In - 02/09/23 1522       Check-In   Supervising physician immediately available to respond to emergencies [facility] MD immediately available    Physician(s) Joni Reining, NP    Location [facility]    Staff Present Essie Hart, RN, Doris Cheadle, MS, ACSM-CEP, Exercise Physiologist;Zarria Towell Erin Sons BS, ACSM-CEP, Exercise Physiologist    Virtual Visit No    Medication changes reported     No    Fall or balance concerns reported    No    Tobacco Cessation No Change    Warm-up and Cool-down Performed as group-led instruction   Orientation   Resistance Training Performed Yes    VAD Patient? No             Capillary Blood Glucose: No results found for this or any previous visit (from the past 24 hour(s)).    Social History   Tobacco Use  Smoking Status Former   Current packs/day: 0.00   Types: Cigarettes   Start date: 11/24/1982   Quit date: 11/23/1997   Years since quitting: 25.2  Smokeless Tobacco Never    Goals Met:  Proper associated with RPD/PD & O2 Sat Independence with exercise equipment Exercise tolerated well No report of concerns or symptoms today Strength training completed today  Goals Unmet:  Not Applicable  Comments: Service time is from 1314 to 1457.    Dr. Mechele Collin is Medical Director for Pulmonary Rehab at [facility].

## 2023-02-14 ENCOUNTER — Telehealth (HOSPITAL_COMMUNITY): Payer: Self-pay

## 2023-02-14 ENCOUNTER — Encounter (HOSPITAL_COMMUNITY): Payer: No Typology Code available for payment source

## 2023-02-14 NOTE — Telephone Encounter (Signed)
Veronica Rivers called and stated she would not make it to pulmonary rehab today. She has started throwing up again. Informed her that I would let staff now

## 2023-02-16 ENCOUNTER — Encounter (HOSPITAL_COMMUNITY)
Admission: RE | Admit: 2023-02-16 | Discharge: 2023-02-16 | Disposition: A | Discharge status: Home / Self Care | Payer: No Typology Code available for payment source | Source: Ambulatory Visit | Attending: Pulmonary Disease | Admitting: Pulmonary Disease

## 2023-02-16 VITALS — Wt 267.4 lb

## 2023-02-16 DIAGNOSIS — J9611 Chronic respiratory failure with hypoxia: Secondary | ICD-10-CM | POA: Diagnosis not present

## 2023-02-16 NOTE — Progress Notes (Signed)
Daily Session Note  Patient Details  Name: Veronica Rivers MRN: [id number] Date of Birth: 25-Sep-1965 Referring Provider:   Doristine Devoid Pulmonary Rehab Walk Test from 01/06/2023 in [facility] for Heart, Vascular, & Lung Health  Referring Provider [name]       Encounter Date: 02/16/2023  Check In:  Session Check In - 02/16/23 1359       Check-In   Supervising physician immediately available to respond to emergencies [facility] MD immediately available    Physician(s) Jari Favre, NP    Location [facility]    Staff Present Essie Hart, RN, Doris Cheadle, MS, ACSM-CEP, Exercise Physiologist;Casey Erin Sons BS, ACSM-CEP, Exercise Physiologist;Samantha Belarus, RD, LDN    Virtual Visit No    Medication changes reported     No    Fall or balance concerns reported    No    Tobacco Cessation No Change    Warm-up and Cool-down Performed as group-led instruction   Orientation   Resistance Training Performed Yes    VAD Patient? No    PAD/SET Patient? No      Pain Assessment   Currently in Pain? No/denies    Multiple Pain Sites No             Capillary Blood Glucose: No results found for this or any previous visit (from the past 24 hour(s)).    Social History   Tobacco Use  Smoking Status Former   Current packs/day: 0.00   Types: Cigarettes   Start date: 11/24/1982   Quit date: 11/23/1997   Years since quitting: 25.2  Smokeless Tobacco Never    Goals Met:  Independence with exercise equipment Exercise tolerated well Personal goals reviewed Strength training completed today  Goals Unmet:  Not Applicable  Comments: Service time is from 1313 to 1455  Dr. Mechele Collin is Medical Director for Pulmonary Rehab at [facility].

## 2023-02-21 ENCOUNTER — Encounter (HOSPITAL_COMMUNITY): Payer: No Typology Code available for payment source

## 2023-02-23 ENCOUNTER — Encounter (HOSPITAL_COMMUNITY): Payer: No Typology Code available for payment source

## 2023-02-23 NOTE — Telephone Encounter (Signed)
Dr. [name] is not in the office. Beth can you advise?

## 2023-02-24 NOTE — Telephone Encounter (Signed)
This isn't urgent, needs to go to primary pulmonologist.

## 2023-02-27 ENCOUNTER — Telehealth (HOSPITAL_COMMUNITY): Payer: Self-pay

## 2023-02-27 NOTE — Telephone Encounter (Signed)
LVM for patient regarding missed classes for Pulm Rehab. Awaiting call back

## 2023-02-27 NOTE — Telephone Encounter (Signed)
Pt called and left a vm. She stated " I wanted to call and let you know I will not be coming back to pulmonary rehab. Thank you."

## 2023-02-27 NOTE — Progress Notes (Signed)
Discharge Progress Report  Patient Details  Name: Veronica Rivers MRN: [id number] Date of Birth: 10/03/1965 Referring Provider:   Doristine Devoid Pulmonary Rehab Walk Test from 01/06/2023 in [facility]  Referring Provider [facility]        Number of Visits: 8  Reason for Discharge:  Early Exit:  Personal  Smoking History:  Social History   Tobacco Use  Smoking Status Former   Current packs/day: 0.00   Types: Cigarettes   Start date: 11/24/1982   Quit date: 11/23/1997   Years since quitting: 25.2  Smokeless Tobacco Never    Diagnosis:  Chronic respiratory failure with hypoxia (HCC)  ADL UCSD:  Pulmonary Assessment Scores     Row Name 01/06/23 1040         ADL UCSD   ADL Phase Entry     SOB Score total 48       CAT Score   CAT Score 13       mMRC Score   mMRC Score 2              Initial Exercise Prescription:  Initial Exercise Prescription - 01/06/23 1100       Date of Initial Exercise RX and Referring Provider   Date 01/06/23    Referring Provider [name]    Expected Discharge Date 03/30/23      Oxygen   Oxygen Continuous    Liters 2    Maintain Oxygen Saturation 88% or higher      Treadmill   MPH 1.5    Grade 0    Minutes 15    METs 2.15      Recumbant Elliptical   Level 1    RPM 40    Watts 20    Minutes 15      Prescription Details   Frequency (times per week) 2    Duration Progress to 30 minutes of continuous aerobic without signs/symptoms of physical distress      Intensity   THRR 40-80% of Max Heartrate 65-131    Ratings of Perceived Exertion 11-13    Perceived Dyspnea 0-4      Progression   Progression Continue to progress workloads to maintain intensity without signs/symptoms of physical distress.      Resistance Training   Training Prescription Yes    Weight blue bands    Reps 10-15             Discharge Exercise Prescription (Final Exercise Prescription  Changes):  Exercise Prescription Changes - 02/21/23 1500       Response to Exercise   Blood Pressure (Admit) 112/80    Blood Pressure (Exit) 140/70    Heart Rate (Admit) 73 bpm    Heart Rate (Exercise) 101 bpm    Heart Rate (Exit) 72 bpm    Oxygen Saturation (Admit) 98 %    Oxygen Saturation (Exercise) 92 %    Oxygen Saturation (Exit) 96 %    Rating of Perceived Exertion (Exercise) 13    Perceived Dyspnea (Exercise) 1    Duration Continue with 30 min of aerobic exercise without signs/symptoms of physical distress.    Intensity THRR unchanged      Resistance Training   Training Prescription Yes    Weight blue bands    Reps 10-15    Time 10 Minutes      Oxygen   Oxygen Continuous    Liters 2      Treadmill  MPH 2    Grade 1    Minutes 15    METs 2.6      Recumbant Elliptical   Level 3    Minutes 15    METs 1.7      Oxygen   Maintain Oxygen Saturation 88% or higher             Functional Capacity:  6 Minute Walk     Row Name 01/06/23 1052         6 Minute Walk   Phase Initial     Distance 857 feet     Walk Time 6 minutes     # of Rest Breaks 0     MPH 1.62     METS 1.99     RPE 7     Perceived Dyspnea  1     VO2 Peak 6.97     Symptoms No     Resting HR 72 bpm     Resting BP 108/68     Resting Oxygen Saturation  98 %     Exercise Oxygen Saturation  during 6 min walk 93 %     Max Ex. HR 97 bpm     Max Ex. BP 122/70     2 Minute Post BP 104/68       Interval HR   1 Minute HR 90     2 Minute HR 90     3 Minute HR 96     4 Minute HR 97     5 Minute HR 96     6 Minute HR 97     2 Minute Post HR 74     Interval Heart Rate? Yes       Interval Oxygen   Interval Oxygen? Yes     Baseline Oxygen Saturation % 98 %     1 Minute Oxygen Saturation % 97 %     1 Minute Liters of Oxygen 2 L     2 Minute Oxygen Saturation % 94 %     2 Minute Liters of Oxygen 2 L     3 Minute Oxygen Saturation % 96 %     3 Minute Liters of Oxygen 2 L     4 Minute  Oxygen Saturation % 96 %     4 Minute Liters of Oxygen 2 L     5 Minute Oxygen Saturation % 93 %     5 Minute Liters of Oxygen 2 L     6 Minute Oxygen Saturation % 96 %     6 Minute Liters of Oxygen 2 L     2 Minute Post Oxygen Saturation % 98 %     2 Minute Post Liters of Oxygen 2 L              Psychological, QOL, Others - Outcomes: PHQ 2/9:    01/06/2023    9:27 AM 04/20/2016    8:36 AM  Depression screen PHQ 2/9  Decreased Interest 1 0  Down, Depressed, Hopeless 2 0  PHQ - 2 Score 3 0  Altered sleeping 2   Tired, decreased energy 3   Change in appetite 1   Feeling bad or failure about yourself  2   Trouble concentrating 1   Moving slowly or fidgety/restless 0   Suicidal thoughts 1   PHQ-9 Score 13   Difficult doing work/chores Somewhat difficult     Quality of Life:   Personal Goals: Goals established at orientation  with interventions provided to work toward goal.  Personal Goals and Risk Factors at Admission - 01/06/23 0940       Core Components/Risk Factors/Patient Goals on Admission    Weight Management Weight Loss;Yes    Intervention Weight Management: Develop a combined nutrition and exercise program designed to reach desired caloric intake, while maintaining appropriate intake of nutrient and fiber, sodium and fats, and appropriate energy expenditure required for the weight goal.;Weight Management: Provide education and appropriate resources to help participant work on and attain dietary goals.;Weight Management/Obesity: Establish reasonable short term and long term weight goals.;Obesity: Provide education and appropriate resources to help participant work on and attain dietary goals.    Expected Outcomes Short Term: Continue to assess and modify interventions until short term weight is achieved;Long Term: Adherence to nutrition and physical activity/exercise program aimed toward attainment of established weight goal;Weight Maintenance: Understanding of the  daily nutrition guidelines, which includes 25-35% calories from fat, 7% or less cal from saturated fats, less than 200mg  cholesterol, less than 1.5gm of sodium, & 5 or more servings of fruits and vegetables daily;Weight Loss: Understanding of general recommendations for a balanced deficit meal plan, which promotes 1-2 lb weight loss per week and includes a negative energy balance of [phone] kcal/d;Understanding recommendations for meals to include 15-35% energy as protein, 25-35% energy from fat, 35-60% energy from carbohydrates, less than 200mg  of dietary cholesterol, 20-35 gm of total fiber daily;Understanding of distribution of calorie intake throughout the day with the consumption of 4-5 meals/snacks    Improve shortness of breath with ADL's Yes    Intervention Provide education, individualized exercise plan and daily activity instruction to help decrease symptoms of SOB with activities of daily living.    Expected Outcomes Short Term: Improve cardiorespiratory fitness to achieve a reduction of symptoms when performing ADLs;Long Term: Be able to perform more ADLs without symptoms or delay the onset of symptoms              Personal Goals Discharge:  Goals and Risk Factor Review     Row Name 01/11/23 1224 02/03/23 1036 02/27/23 1542         Core Components/Risk Factors/Patient Goals Review   Personal Goals Review Weight Management/Obesity;Improve shortness of breath with ADL's;Develop more efficient breathing techniques such as purse lipped breathing and diaphragmatic breathing and practicing self-pacing with activity. Weight Management/Obesity;Improve shortness of breath with ADL's;Develop more efficient breathing techniques such as purse lipped breathing and diaphragmatic breathing and practicing self-pacing with activity. Weight Management/Obesity;Improve shortness of breath with ADL's;Develop more efficient breathing techniques such as purse lipped breathing and diaphragmatic breathing and  practicing self-pacing with activity.     Review Unable to assess goals yet. Rayhanna has completed 1 class so far. Goal progressing for weight loss. Makaley is working with our dietician for weight loss goals. Goal progressing on improving her shortness of breath with ADLs. Goal progressing on developing more efficient breathing techniques such as purse lipped breathing and diaphragmatic breathing; and practicing self-pacing with activity. Kyrielle has to be reminded to use purse lip breathing when she gets SOB. Corazon was discharged from the program on 02/27/23. She called and left a message stating she could not continue with pulmonary rehab. Goal for weight loss not met at the time of discharge. Goal met on developing more efficient breathing techniques such as purse lipped breathing and diaphragmatic breathing; and practicing self-pacing with activity. Suzanna was able to demonstrate purse lip breathing when she became short of breath. Unable to  determine if goal was met for improving shortness of breath with ADL's.     Expected Outcomes For Carren to lose weight, improve her shortness of breath with ADLs, and devlop more efficient breathing techniques such as purse lipped breathing and diaphragmatic breathing; and practicing self-pacing with activity For Kyanah to lose weight, improve her shortness of breath with ADLs, and devlop more efficient breathing techniques such as purse lipped breathing and diaphragmatic breathing; and practicing self-pacing with activity To continue to exercise and modify her nutrition and lifestyle post discharge              Exercise Goals and Review:  Exercise Goals     Row Name 01/06/23 0936             Exercise Goals   Increase Physical Activity Yes       Intervention Provide advice, education, support and counseling about physical activity/exercise needs.;Develop an individualized exercise prescription for aerobic and resistive training based on initial evaluation findings,  risk stratification, comorbidities and participant's personal goals.       Expected Outcomes Short Term: Attend rehab on a regular basis to increase amount of physical activity.;Long Term: Exercising regularly at least 3-5 days a week.;Long Term: Add in home exercise to make exercise part of routine and to increase amount of physical activity.       Increase Strength and Stamina Yes       Intervention Provide advice, education, support and counseling about physical activity/exercise needs.;Develop an individualized exercise prescription for aerobic and resistive training based on initial evaluation findings, risk stratification, comorbidities and participant's personal goals.       Expected Outcomes Short Term: Increase workloads from initial exercise prescription for resistance, speed, and METs.;Short Term: Perform resistance training exercises routinely during rehab and add in resistance training at home;Long Term: Improve cardiorespiratory fitness, muscular endurance and strength as measured by increased METs and functional capacity ( )       Able to understand and use rate of perceived exertion (RPE) scale Yes       Intervention Provide education and explanation on how to use RPE scale       Expected Outcomes Short Term: Able to use RPE daily in rehab to express subjective intensity level;Long Term:  Able to use RPE to guide intensity level when exercising independently       Able to understand and use Dyspnea scale Yes       Intervention Provide education and explanation on how to use Dyspnea scale       Expected Outcomes Short Term: Able to use Dyspnea scale daily in rehab to express subjective sense of shortness of breath during exertion;Long Term: Able to use Dyspnea scale to guide intensity level when exercising independently       Knowledge and understanding of Target Heart Rate Range (THRR) Yes       Intervention Provide education and explanation of THRR including how the numbers were  predicted and where they are located for reference       Expected Outcomes Short Term: Able to state/look up THRR;Long Term: Able to use THRR to govern intensity when exercising independently;Short Term: Able to use daily as guideline for intensity in rehab       Understanding of Exercise Prescription Yes       Intervention Provide education, explanation, and written materials on patient's individual exercise prescription       Expected Outcomes Short Term: Able to explain program exercise prescription;Long Term: Able to  explain home exercise prescription to exercise independently                Exercise Goals Re-Evaluation:  Exercise Goals Re-Evaluation     Row Name 01/31/23 0935 02/27/23 1511           Exercise Goal Re-Evaluation   Exercise Goals Review Increase Physical Activity;Able to understand and use Dyspnea scale;Understanding of Exercise Prescription;Increase Strength and Stamina;Knowledge and understanding of Target Heart Rate Range (THRR);Able to understand and use rate of perceived exertion (RPE) scale Increase Physical Activity;Able to understand and use Dyspnea scale;Understanding of Exercise Prescription;Increase Strength and Stamina;Knowledge and understanding of Target Heart Rate Range (THRR);Able to understand and use rate of perceived exertion (RPE) scale      Comments Pt has completed 3 group exercise sessions. She has missed 3 sessions due to illness. She is exercising 15 min on the recumbent elliptical at level 1, METs 1.8. She then is walking on the treadmill at 1.9 mph, incline 0, METs 2.45. She is tolerating fairly well, will try to increase workload today. Performs sit to stands instead of squats. Pt completed 8 group exercise sessions. She called 10/21 to be d/c'd from program, not stating the reason. She missed 3 sessions due to illness and then 3 more recent sessions for unknown reasons. She was tolerating exercise well but had not progressed significantly with her  METs presumed due to inconsistent attendance. She exercised 15 min on the recumbent elliptical at level 3, METs 1.7. She then walked on the treadmill at 2.0 mph, incline 1, METs 2.6. Did not get pts SOB scale, CAT or post 6 min walk test due to termination of the program.      Expected Outcomes Through exercise at rehab and home, the patient will decrease shortness of breath with daily activities and feel confident in carrying out an exercise regimen at home Through exercise at rehab and home, the patient will decrease shortness of breath with daily activities and feel confident in carrying out an exercise regimen at home               Nutrition & Weight - Outcomes:  Pre Biometrics - 01/06/23 0917       Pre Biometrics   Grip Strength 35 kg              Nutrition:  Nutrition Therapy & Goals - 02/07/23 1423       Nutrition Therapy   Diet Heart Healthy/Carbohydrate Consistent Diet      Personal Nutrition Goals   Nutrition Goal Patient to improve diet quality by using the plate method as a guide for meal planning to include lean protein/plant protein, fruits, vegetables, whole grains, nonfat dairy as part of a well-balanced diet.    Personal Goal #2 Patient to identify strategies for weight loss of 0.5-2.0# per week.    Comments Goals in action. Marilisa reports motivation to lose ~50#; she reports her highest weight was 271#. She is down 7# since starting with our program. She does report that she has received some nutrition counseling through her therapist. We have discussed multiple strategies for weight loss including the plate method as a guide for meal planning, calorie density, benefits of high fiber/high protein intake, etc. Reviewed the 15 rule for treating low blood sugars and benefits of carbohydrate consistency. Her husband is a good support. Patient will benefit from participation in pulmonary rehab for nutrition, exercise, and lifestyle modification.      Intervention Plan  Intervention Prescribe, educate and counsel regarding individualized specific dietary modifications aiming towards targeted core components such as weight, hypertension, lipid management, diabetes, heart failure and other comorbidities.;Nutrition handout(s) given to patient.    Expected Outcomes Short Term Goal: Understand basic principles of dietary content, such as calories, fat, sodium, cholesterol and nutrients.;Long Term Goal: Adherence to prescribed nutrition plan.             Nutrition Discharge:  Nutrition Assessments - 01/12/23 0826       Rate Your Plate Scores   Pre Score 62             Education Questionnaire Score:  Knowledge Questionnaire Score - 01/06/23 1047       Knowledge Questionnaire Score   Pre Score 16/18             Goals reviewed with patient; copy given to patient.

## 2023-02-28 ENCOUNTER — Encounter (HOSPITAL_COMMUNITY): Payer: No Typology Code available for payment source

## 2023-03-01 NOTE — Progress Notes (Signed)
Discharge Progress Report  Patient Details  Name: Veronica Rivers MRN: [id number] Date of Birth: 10/03/1965 Referring Provider:   Doristine Devoid Pulmonary Rehab Walk Test from 01/06/2023 in [facility]  Referring Provider [facility]        Number of Visits: 8  Reason for Discharge:  Early Exit:  Personal  Smoking History:  Social History   Tobacco Use  Smoking Status Former   Current packs/day: 0.00   Types: Cigarettes   Start date: 11/24/1982   Quit date: 11/23/1997   Years since quitting: 25.2  Smokeless Tobacco Never    Diagnosis:  Chronic respiratory failure with hypoxia (HCC)  ADL UCSD:  Pulmonary Assessment Scores     Row Name 01/06/23 1040         ADL UCSD   ADL Phase Entry     SOB Score total 48       CAT Score   CAT Score 13       mMRC Score   mMRC Score 2              Initial Exercise Prescription:  Initial Exercise Prescription - 01/06/23 1100       Date of Initial Exercise RX and Referring Provider   Date 01/06/23    Referring Provider [name]    Expected Discharge Date 03/30/23      Oxygen   Oxygen Continuous    Liters 2    Maintain Oxygen Saturation 88% or higher      Treadmill   MPH 1.5    Grade 0    Minutes 15    METs 2.15      Recumbant Elliptical   Level 1    RPM 40    Watts 20    Minutes 15      Prescription Details   Frequency (times per week) 2    Duration Progress to 30 minutes of continuous aerobic without signs/symptoms of physical distress      Intensity   THRR 40-80% of Max Heartrate 65-131    Ratings of Perceived Exertion 11-13    Perceived Dyspnea 0-4      Progression   Progression Continue to progress workloads to maintain intensity without signs/symptoms of physical distress.      Resistance Training   Training Prescription Yes    Weight blue bands    Reps 10-15             Discharge Exercise Prescription (Final Exercise Prescription  Changes):  Exercise Prescription Changes - 02/21/23 1500       Response to Exercise   Blood Pressure (Admit) 112/80    Blood Pressure (Exit) 140/70    Heart Rate (Admit) 73 bpm    Heart Rate (Exercise) 101 bpm    Heart Rate (Exit) 72 bpm    Oxygen Saturation (Admit) 98 %    Oxygen Saturation (Exercise) 92 %    Oxygen Saturation (Exit) 96 %    Rating of Perceived Exertion (Exercise) 13    Perceived Dyspnea (Exercise) 1    Duration Continue with 30 min of aerobic exercise without signs/symptoms of physical distress.    Intensity THRR unchanged      Resistance Training   Training Prescription Yes    Weight blue bands    Reps 10-15    Time 10 Minutes      Oxygen   Oxygen Continuous    Liters 2      Treadmill  MPH 2    Grade 1    Minutes 15    METs 2.6      Recumbant Elliptical   Level 3    Minutes 15    METs 1.7      Oxygen   Maintain Oxygen Saturation 88% or higher             Functional Capacity:  6 Minute Walk     Row Name 01/06/23 1052         6 Minute Walk   Phase Initial     Distance 857 feet     Walk Time 6 minutes     # of Rest Breaks 0     MPH 1.62     METS 1.99     RPE 7     Perceived Dyspnea  1     VO2 Peak 6.97     Symptoms No     Resting HR 72 bpm     Resting BP 108/68     Resting Oxygen Saturation  98 %     Exercise Oxygen Saturation  during 6 min walk 93 %     Max Ex. HR 97 bpm     Max Ex. BP 122/70     2 Minute Post BP 104/68       Interval HR   1 Minute HR 90     2 Minute HR 90     3 Minute HR 96     4 Minute HR 97     5 Minute HR 96     6 Minute HR 97     2 Minute Post HR 74     Interval Heart Rate? Yes       Interval Oxygen   Interval Oxygen? Yes     Baseline Oxygen Saturation % 98 %     1 Minute Oxygen Saturation % 97 %     1 Minute Liters of Oxygen 2 L     2 Minute Oxygen Saturation % 94 %     2 Minute Liters of Oxygen 2 L     3 Minute Oxygen Saturation % 96 %     3 Minute Liters of Oxygen 2 L     4 Minute  Oxygen Saturation % 96 %     4 Minute Liters of Oxygen 2 L     5 Minute Oxygen Saturation % 93 %     5 Minute Liters of Oxygen 2 L     6 Minute Oxygen Saturation % 96 %     6 Minute Liters of Oxygen 2 L     2 Minute Post Oxygen Saturation % 98 %     2 Minute Post Liters of Oxygen 2 L              Psychological, QOL, Others - Outcomes: PHQ 2/9:    01/06/2023    9:27 AM 04/20/2016    8:36 AM  Depression screen PHQ 2/9  Decreased Interest 1 0  Down, Depressed, Hopeless 2 0  PHQ - 2 Score 3 0  Altered sleeping 2   Tired, decreased energy 3   Change in appetite 1   Feeling bad or failure about yourself  2   Trouble concentrating 1   Moving slowly or fidgety/restless 0   Suicidal thoughts 1   PHQ-9 Score 13   Difficult doing work/chores Somewhat difficult     Quality of Life:   Personal Goals: Goals established at orientation  with interventions provided to work toward goal.  Personal Goals and Risk Factors at Admission - 01/06/23 0940       Core Components/Risk Factors/Patient Goals on Admission    Weight Management Weight Loss;Yes    Intervention Weight Management: Develop a combined nutrition and exercise program designed to reach desired caloric intake, while maintaining appropriate intake of nutrient and fiber, sodium and fats, and appropriate energy expenditure required for the weight goal.;Weight Management: Provide education and appropriate resources to help participant work on and attain dietary goals.;Weight Management/Obesity: Establish reasonable short term and long term weight goals.;Obesity: Provide education and appropriate resources to help participant work on and attain dietary goals.    Expected Outcomes Short Term: Continue to assess and modify interventions until short term weight is achieved;Long Term: Adherence to nutrition and physical activity/exercise program aimed toward attainment of established weight goal;Weight Maintenance: Understanding of the  daily nutrition guidelines, which includes 25-35% calories from fat, 7% or less cal from saturated fats, less than 200mg  cholesterol, less than 1.5gm of sodium, & 5 or more servings of fruits and vegetables daily;Weight Loss: Understanding of general recommendations for a balanced deficit meal plan, which promotes 1-2 lb weight loss per week and includes a negative energy balance of [phone] kcal/d;Understanding recommendations for meals to include 15-35% energy as protein, 25-35% energy from fat, 35-60% energy from carbohydrates, less than 200mg  of dietary cholesterol, 20-35 gm of total fiber daily;Understanding of distribution of calorie intake throughout the day with the consumption of 4-5 meals/snacks    Improve shortness of breath with ADL's Yes    Intervention Provide education, individualized exercise plan and daily activity instruction to help decrease symptoms of SOB with activities of daily living.    Expected Outcomes Short Term: Improve cardiorespiratory fitness to achieve a reduction of symptoms when performing ADLs;Long Term: Be able to perform more ADLs without symptoms or delay the onset of symptoms              Personal Goals Discharge:  Goals and Risk Factor Review     Row Name 01/11/23 1224 02/03/23 1036 02/27/23 1542         Core Components/Risk Factors/Patient Goals Review   Personal Goals Review Weight Management/Obesity;Improve shortness of breath with ADL's;Develop more efficient breathing techniques such as purse lipped breathing and diaphragmatic breathing and practicing self-pacing with activity. Weight Management/Obesity;Improve shortness of breath with ADL's;Develop more efficient breathing techniques such as purse lipped breathing and diaphragmatic breathing and practicing self-pacing with activity. Weight Management/Obesity;Improve shortness of breath with ADL's;Develop more efficient breathing techniques such as purse lipped breathing and diaphragmatic breathing and  practicing self-pacing with activity.     Review Unable to assess goals yet. Rayhanna has completed 1 class so far. Goal progressing for weight loss. Makaley is working with our dietician for weight loss goals. Goal progressing on improving her shortness of breath with ADLs. Goal progressing on developing more efficient breathing techniques such as purse lipped breathing and diaphragmatic breathing; and practicing self-pacing with activity. Kyrielle has to be reminded to use purse lip breathing when she gets SOB. Corazon was discharged from the program on 02/27/23. She called and left a message stating she could not continue with pulmonary rehab. Goal for weight loss not met at the time of discharge. Goal met on developing more efficient breathing techniques such as purse lipped breathing and diaphragmatic breathing; and practicing self-pacing with activity. Suzanna was able to demonstrate purse lip breathing when she became short of breath. Unable to  determine if goal was met for improving shortness of breath with ADL's.     Expected Outcomes For Carren to lose weight, improve her shortness of breath with ADLs, and devlop more efficient breathing techniques such as purse lipped breathing and diaphragmatic breathing; and practicing self-pacing with activity For Kyanah to lose weight, improve her shortness of breath with ADLs, and devlop more efficient breathing techniques such as purse lipped breathing and diaphragmatic breathing; and practicing self-pacing with activity To continue to exercise and modify her nutrition and lifestyle post discharge              Exercise Goals and Review:  Exercise Goals     Row Name 01/06/23 0936             Exercise Goals   Increase Physical Activity Yes       Intervention Provide advice, education, support and counseling about physical activity/exercise needs.;Develop an individualized exercise prescription for aerobic and resistive training based on initial evaluation findings,  risk stratification, comorbidities and participant's personal goals.       Expected Outcomes Short Term: Attend rehab on a regular basis to increase amount of physical activity.;Long Term: Exercising regularly at least 3-5 days a week.;Long Term: Add in home exercise to make exercise part of routine and to increase amount of physical activity.       Increase Strength and Stamina Yes       Intervention Provide advice, education, support and counseling about physical activity/exercise needs.;Develop an individualized exercise prescription for aerobic and resistive training based on initial evaluation findings, risk stratification, comorbidities and participant's personal goals.       Expected Outcomes Short Term: Increase workloads from initial exercise prescription for resistance, speed, and METs.;Short Term: Perform resistance training exercises routinely during rehab and add in resistance training at home;Long Term: Improve cardiorespiratory fitness, muscular endurance and strength as measured by increased METs and functional capacity ( )       Able to understand and use rate of perceived exertion (RPE) scale Yes       Intervention Provide education and explanation on how to use RPE scale       Expected Outcomes Short Term: Able to use RPE daily in rehab to express subjective intensity level;Long Term:  Able to use RPE to guide intensity level when exercising independently       Able to understand and use Dyspnea scale Yes       Intervention Provide education and explanation on how to use Dyspnea scale       Expected Outcomes Short Term: Able to use Dyspnea scale daily in rehab to express subjective sense of shortness of breath during exertion;Long Term: Able to use Dyspnea scale to guide intensity level when exercising independently       Knowledge and understanding of Target Heart Rate Range (THRR) Yes       Intervention Provide education and explanation of THRR including how the numbers were  predicted and where they are located for reference       Expected Outcomes Short Term: Able to state/look up THRR;Long Term: Able to use THRR to govern intensity when exercising independently;Short Term: Able to use daily as guideline for intensity in rehab       Understanding of Exercise Prescription Yes       Intervention Provide education, explanation, and written materials on patient's individual exercise prescription       Expected Outcomes Short Term: Able to explain program exercise prescription;Long Term: Able to  explain home exercise prescription to exercise independently                Exercise Goals Re-Evaluation:  Exercise Goals Re-Evaluation     Row Name 01/31/23 0935 02/27/23 1511           Exercise Goal Re-Evaluation   Exercise Goals Review Increase Physical Activity;Able to understand and use Dyspnea scale;Understanding of Exercise Prescription;Increase Strength and Stamina;Knowledge and understanding of Target Heart Rate Range (THRR);Able to understand and use rate of perceived exertion (RPE) scale Increase Physical Activity;Able to understand and use Dyspnea scale;Understanding of Exercise Prescription;Increase Strength and Stamina;Knowledge and understanding of Target Heart Rate Range (THRR);Able to understand and use rate of perceived exertion (RPE) scale      Comments Pt has completed 3 group exercise sessions. She has missed 3 sessions due to illness. She is exercising 15 min on the recumbent elliptical at level 1, METs 1.8. She then is walking on the treadmill at 1.9 mph, incline 0, METs 2.45. She is tolerating fairly well, will try to increase workload today. Performs sit to stands instead of squats. Pt completed 8 group exercise sessions. She called 10/21 to be d/c'd from program, not stating the reason. She missed 3 sessions due to illness and then 3 more recent sessions for unknown reasons. She was tolerating exercise well but had not progressed significantly with her  METs presumed due to inconsistent attendance. She exercised 15 min on the recumbent elliptical at level 3, METs 1.7. She then walked on the treadmill at 2.0 mph, incline 1, METs 2.6. Did not get pts SOB scale, CAT or post 6 min walk test due to termination of the program.      Expected Outcomes Through exercise at rehab and home, the patient will decrease shortness of breath with daily activities and feel confident in carrying out an exercise regimen at home Through exercise at rehab and home, the patient will decrease shortness of breath with daily activities and feel confident in carrying out an exercise regimen at home               Nutrition & Weight - Outcomes:  Pre Biometrics - 01/06/23 0917       Pre Biometrics   Grip Strength 35 kg              Nutrition:  Nutrition Therapy & Goals - 02/07/23 1423       Nutrition Therapy   Diet Heart Healthy/Carbohydrate Consistent Diet      Personal Nutrition Goals   Nutrition Goal Patient to improve diet quality by using the plate method as a guide for meal planning to include lean protein/plant protein, fruits, vegetables, whole grains, nonfat dairy as part of a well-balanced diet.    Personal Goal #2 Patient to identify strategies for weight loss of 0.5-2.0# per week.    Comments Goals in action. Marilisa reports motivation to lose ~50#; she reports her highest weight was 271#. She is down 7# since starting with our program. She does report that she has received some nutrition counseling through her therapist. We have discussed multiple strategies for weight loss including the plate method as a guide for meal planning, calorie density, benefits of high fiber/high protein intake, etc. Reviewed the 15 rule for treating low blood sugars and benefits of carbohydrate consistency. Her husband is a good support. Patient will benefit from participation in pulmonary rehab for nutrition, exercise, and lifestyle modification.      Intervention Plan  Intervention Prescribe, educate and counsel regarding individualized specific dietary modifications aiming towards targeted core components such as weight, hypertension, lipid management, diabetes, heart failure and other comorbidities.;Nutrition handout(s) given to patient.    Expected Outcomes Short Term Goal: Understand basic principles of dietary content, such as calories, fat, sodium, cholesterol and nutrients.;Long Term Goal: Adherence to prescribed nutrition plan.             Nutrition Discharge:  Nutrition Assessments - 01/12/23 0826       Rate Your Plate Scores   Pre Score 62             Education Questionnaire Score:  Knowledge Questionnaire Score - 01/06/23 1047       Knowledge Questionnaire Score   Pre Score 16/18             Goals reviewed with patient; copy given to patient.

## 2023-03-02 ENCOUNTER — Encounter (HOSPITAL_COMMUNITY): Payer: No Typology Code available for payment source

## 2023-03-06 NOTE — Telephone Encounter (Signed)
Thank you for your recommendation/guidance with that

## 2023-03-07 ENCOUNTER — Encounter (HOSPITAL_COMMUNITY): Payer: No Typology Code available for payment source

## 2023-03-09 ENCOUNTER — Encounter (HOSPITAL_COMMUNITY): Payer: No Typology Code available for payment source

## 2023-03-14 ENCOUNTER — Encounter (HOSPITAL_COMMUNITY): Payer: No Typology Code available for payment source

## 2023-03-16 ENCOUNTER — Encounter (HOSPITAL_COMMUNITY): Payer: No Typology Code available for payment source

## 2023-03-21 ENCOUNTER — Encounter (HOSPITAL_COMMUNITY): Payer: No Typology Code available for payment source

## 2023-03-23 ENCOUNTER — Encounter (HOSPITAL_COMMUNITY): Payer: No Typology Code available for payment source

## 2023-03-28 ENCOUNTER — Encounter (HOSPITAL_COMMUNITY): Payer: No Typology Code available for payment source

## 2023-03-30 ENCOUNTER — Encounter (HOSPITAL_COMMUNITY): Payer: No Typology Code available for payment source

## 2023-05-15 ENCOUNTER — Other Ambulatory Visit: Payer: Self-pay | Admitting: Cardiovascular Disease

## 2023-06-14 ENCOUNTER — Telehealth: Payer: Self-pay | Admitting: Pulmonary Disease

## 2023-06-14 DIAGNOSIS — U071 COVID-19: Secondary | ICD-10-CM

## 2023-06-14 MED ORDER — MOLNUPIRAVIR EUA 200MG CAPSULE
4.0000 | ORAL_CAPSULE | Freq: Two times a day (BID) | ORAL | 0 refills | Status: AC
Start: 2023-06-14 — End: 2023-06-19

## 2023-06-14 NOTE — Telephone Encounter (Signed)
 I sent in molnupiravir , which is an antiviral for COVID. She has not had recent kidney function so would not recommend paxlovid. Hopefully the molnupiravir  will be covered by insurance. Take with food. If she is not post-menopausal, needs to use reliable form of birth control. Remainder of treatment is supportive - OTC cold/flu medication, tylenol  (if not in cold/flu medication), throat lozenges, push fluids to remain hydrated. ED precautions should respiratory symptoms worsen or unable to keep fluids/medications down. Thanks.

## 2023-06-15 ENCOUNTER — Ambulatory Visit (HOSPITAL_BASED_OUTPATIENT_CLINIC_OR_DEPARTMENT_OTHER): Payer: No Typology Code available for payment source | Admitting: Pulmonary Disease

## 2023-06-15 NOTE — Telephone Encounter (Signed)
 Results have been relayed to the patient/authorized caretaker. The patient/authorized caretaker verbalized understanding. No questions at this time.

## 2023-06-19 ENCOUNTER — Ambulatory Visit: Payer: No Typology Code available for payment source

## 2023-06-19 ENCOUNTER — Telehealth: Payer: Self-pay | Admitting: Primary Care

## 2023-06-19 DIAGNOSIS — U071 COVID-19: Secondary | ICD-10-CM

## 2023-06-19 DIAGNOSIS — J9601 Acute respiratory failure with hypoxia: Secondary | ICD-10-CM

## 2023-06-19 MED ORDER — PREDNISONE 10 MG PO TABS: ORAL_TABLET | ORAL | 0 refills | Status: DC

## 2023-06-19 NOTE — Telephone Encounter (Signed)
 Called patient.  She states she is having short episodes where she begins gasping for air, coughing and wheezing and her SaO2 is dropping into the low 80's.  Patient uses her albuterol  MDI and after 30 seconds -1 minute, SaO2 returns to 93% and all sx resolve.  Patient denies fever.  This is occurring while at rest sitting in a chair.  Several episodes x 4 days, began Friday, 06/16/2023.  Patient is using Symbicort  160 mcg 2 puffs BID daily and albuterol  MDI prn.  Patient states she is doing well in general when not having an "episode".  Please advise.

## 2023-06-19 NOTE — Telephone Encounter (Signed)
 PT just getting off Covid medication and states her Pulse/Oxy falls below 88 (goes to 81 and then comes back up sometimes)  while on O2. Seeks advise on this issue.   [phone] is her #

## 2023-06-19 NOTE — Telephone Encounter (Signed)
 Needs CXR and d-dimer (I ordered). She has a hx of respiratory failure/ If she has oxygen use 2L with exertion and at bedtime for the next several days to maintain level >88-90%. If O2 sustaining <88% needs ED evaluation.   Continue Symbicort  and prn Albuterol  every 4-6 hours for breakthrough symptoms  Sending in pred taper 40x 2 days, 30mg  x 2 days, 20mg  x 2 days, 10mg  x 2 days

## 2023-06-22 ENCOUNTER — Other Ambulatory Visit: Payer: No Typology Code available for payment source

## 2023-06-22 ENCOUNTER — Ambulatory Visit (INDEPENDENT_AMBULATORY_CARE_PROVIDER_SITE_OTHER): Payer: No Typology Code available for payment source

## 2023-06-22 DIAGNOSIS — J9601 Acute respiratory failure with hypoxia: Secondary | ICD-10-CM

## 2023-06-22 DIAGNOSIS — U071 COVID-19: Secondary | ICD-10-CM | POA: Diagnosis not present

## 2023-06-22 LAB — D-DIMER, QUANTITATIVE: D-Dimer, Quant: 0.56 ug{FEU}/mL — ABNORMAL HIGH (ref <0.50)

## 2023-06-22 NOTE — Telephone Encounter (Signed)
Called and spoke with patient.  Patient came in to clinic today and got blood work and cxr.  Informed patient she will get a call once the provider reviews the results.  Patient states she has an office visit with Dr. [name] tomorrow at [facility].  Informed patient Dr. [name] will also review results with her at this visit.

## 2023-06-22 NOTE — Telephone Encounter (Signed)
Sent Epic chat to Amy to ask her to please see NP's note.

## 2023-06-22 NOTE — Telephone Encounter (Addendum)
Left urgent message on VM for patient to call clinic and informed patient can walk into our clinic today for chest xray and get lab work.  Orders have been placed per [facility].  Will try to call patient again later today.  Left detailed message with walk in hours for both lab and xray.  Need to remind patient that prednisone was called into her pharmacy and she needs to start med today.

## 2023-06-23 ENCOUNTER — Encounter (HOSPITAL_BASED_OUTPATIENT_CLINIC_OR_DEPARTMENT_OTHER): Payer: Self-pay | Admitting: Pulmonary Disease

## 2023-06-23 ENCOUNTER — Telehealth (HOSPITAL_BASED_OUTPATIENT_CLINIC_OR_DEPARTMENT_OTHER): Payer: No Typology Code available for payment source | Admitting: Pulmonary Disease

## 2023-06-23 DIAGNOSIS — J9611 Chronic respiratory failure with hypoxia: Secondary | ICD-10-CM | POA: Diagnosis not present

## 2023-06-23 DIAGNOSIS — J432 Centrilobular emphysema: Secondary | ICD-10-CM

## 2023-06-23 NOTE — Progress Notes (Signed)
Subjective:    Patient ID: Veronica Rivers, female    DOB: 1965-07-07, 58 y.o.   MRN: [id number]  HPI  58 yo remote smoker for FU of asthma/emphysema & noct hypoxia She smoked about 1.5 packs/day before she quit in 99 about 25 pack years.   PMH - hypertension,  obstructive sleep apnea,  type 2 diabetes,  bipolar depression    She reports a diagnosis of asthma in her 40s triggered by exercise and weather changes and chest colds. She underwent detailed allergy testing which was negative. 4  Cats + 1 dog  in the house.   Meds - She was on Symbicort x years, changed to [facility] in 2017.  Incruse was stopped after her last visit 04/2018 showing good PFTs   PMH - esophageal spasm , chest pain >> negative stress test and CT angiogram   Meds - did not tolerate BREO due to nausea/vomiting    12/2022 On ambulation without oxygen, saturation dropped to 87% on record with oxygen and stayed at 96% on 2 L POC  Video visit today. She called 2/10-was given Paxlovid for COVID infection she was having short episodes where she begins gasping for air, coughing and wheezing and her SaO2 is dropping into the low 80's.  Patient uses her albuterol MDI and after 30 seconds -1 minute, SaO2 returns to 93% and all sx resolve.  Patient denies fever.  This is occurring while at rest sitting in a chair.  Several episodes x 4 days, began Friday, 06/16/2023.   Patient is using Symbicort 160 mcg 2 puffs BID daily and albuterol MDI prn  We obtained chest x-ray which did not show any infiltrates, D-dimer was borderline +0.56.  She received a course of prednisone and this has helped significantly, she is back to her baseline now she only complains of a persistent sore throat.  She is able to stay off oxygen sometimes for 3 hours in the daytime and continues to use this at night   Significant tests/ events reviewed   11/2022 CT angiogram patchy GGO left more than right upper lobe 02/2019 CT angiogram negative 10/2017 CT  angiogram -no  evidence of pulmonary embolus,changes of bullous emphysema in apices   Echo bubble study 11/2022 -negative, RVSP 35     PFT 11/14/2022 >> FVC 2.88 (70%), FEV1 2.45 (76%), ratio 85, DLCO 21.81 (88%)  04/2018 PFTs ratio normal, FEV1 82%, TLC 89%, DLCO 85%   HST at [facility] medical 02/2020 -very mild OSA , RDI 8/h,, methylphenidate for excessive daytime somnolence   - oxygen saturation lowest 74%, 301 minutes less than 89%  Review of Systems neg for any significant sore throat, dysphagia, itching, sneezing, nasal congestion or excess/ purulent secretions, fever, chills, sweats, unintended wt loss, pleuritic or exertional cp, hempoptysis, orthopnea pnd or change in chronic leg swelling. Also denies presyncope, palpitations, heartburn, abdominal pain, nausea, vomiting, diarrhea or change in bowel or urinary habits, dysuria,hematuria, rash, arthralgias, visual complaints, headache, numbness weakness or ataxia.     Objective:   Physical Exam  On video No accessory muscle use Able to speak in full sentences      Assessment & Plan:    Chronic hypoxic respiratory failure -continue to wean off oxygen in the daytime and continue using it at night.  We will reassess nocturnal oximetry in the future.  Centrilobular emphysema -treated for exacerbation, complete prednisone taper.  Does not need an antibiotic. Continue Symbicort  Total encounter time was 24 minutes  We will  reassess in 3 to 4 months

## 2023-06-23 NOTE — Telephone Encounter (Signed)
Patient had visit with Dr. [name].  Will close this encounter.

## 2023-07-21 ENCOUNTER — Other Ambulatory Visit: Payer: Self-pay | Admitting: Pulmonary Disease

## 2023-07-21 DIAGNOSIS — J4530 Mild persistent asthma, uncomplicated: Secondary | ICD-10-CM

## 2023-10-16 ENCOUNTER — Telehealth: Payer: Self-pay | Admitting: Pulmonary Disease

## 2023-10-16 NOTE — Telephone Encounter (Signed)
 Fax received from Dr. Garry [place] with [facility] to perform a lumbar microdiscectomy on patient.  Patient needs surgery clearance. Surgery is pending. Patient was seen on . Office protocol is a risk assessment can be sent to surgeon if patient has been seen in 60 days or less.   Pt needs appt

## 2023-10-17 NOTE — Telephone Encounter (Signed)
 I called and spoke with the pt and scheduled for appt with Dr. Villa Greaser at [facility] 11/09/23 at 4 pm  Will route to clearance pool for now until ov is complete

## 2023-10-18 ENCOUNTER — Other Ambulatory Visit: Payer: Self-pay | Admitting: Neurosurgery

## 2023-11-03 ENCOUNTER — Encounter (HOSPITAL_COMMUNITY): Payer: Self-pay

## 2023-11-07 NOTE — Progress Notes (Signed)
 Surgical Instructions   Your procedure is scheduled on Thursday November 16, 2023. Report to [facility] Main Entrance A at 5:30 A.M., then check in with the Admitting office. Any questions or running late day of surgery: call [phone]  Questions prior to your surgery date: call [phone], Monday-Friday, 8am-4pm. If you experience any cold or flu symptoms such as cough, fever, chills, shortness of breath, etc. between now and your scheduled surgery, please notify us  at the above number.     Remember:  Do not eat or drink after midnight the night before your surgery  Take these medicines the morning of surgery with A SIP OF WATER  budesonide -formoterol  (SYMBICORT )  buPROPion (WELLBUTRIN XL)  carvedilol  (COREG )  levothyroxine  (SYNTHROID )    May take these medicines IF NEEDED: albuterol  (VENTOLIN  HFA) 108 (90 Base) MCG/ACT inhaler.  Please bring with you to the hospital nitroGLYCERIN  (NITROSTAT ) If you have to take this medication prior to surgery, please call [phone] and report this to a nurse oxyCODONE -acetaminophen  (PERCOCET)   PROMETHEGAN   One week prior to surgery, STOP taking any Aspirin  (unless otherwise instructed by your surgeon) Aleve , Naproxen , Ibuprofen, Motrin, Advil, Goody's, BC's, all herbal medications, fish oil, and non-prescription vitamins.   WHAT DO I DO ABOUT MY DIABETES MEDICATION?   Do not take oral diabetes medicines (pills) the morning of surgery.  THE MORNING OF SURGERY TAKE 25 UNITS OF YOUR insulin  degludec (TRESIBA FLEXTOUCH), WHICH IS 50% OF YOUR REGULAR DOSE.         THE MORNING OF SURGERY TAKE ZERO UNITS OF YOUR insulin  lispro (HUMALOG). If your CBG is greater than 220 mg/dL, you may take  of your sliding scale (correction) dose of insulin .  The day of surgery, do not take other diabetes injectables, including Byetta (exenatide), Bydureon (exenatide ER), Victoza (liraglutide), or Trulicity (dulaglutide).  HOW TO MANAGE YOUR  DIABETES BEFORE AND AFTER SURGERY  Why is it important to control my blood sugar before and after surgery? Improving blood sugar levels before and after surgery helps healing and can limit problems. A way of improving blood sugar control is eating a healthy diet by:  Eating less sugar and carbohydrates  Increasing activity/exercise  Talking with your doctor about reaching your blood sugar goals High blood sugars (greater than 180 mg/dL) can raise your risk of infections and slow your recovery, so you will need to focus on controlling your diabetes during the weeks before surgery. Make sure that the doctor who takes care of your diabetes knows about your planned surgery including the date and location.  How do I manage my blood sugar before surgery? Check your blood sugar at least 4 times a day, starting 2 days before surgery, to make sure that the level is not too high or low.  Check your blood sugar the morning of your surgery when you wake up and every 2 hours until you get to the Short Stay unit.  If your blood sugar is less than 70 mg/dL, you will need to treat for low blood sugar: Do not take insulin . Treat a low blood sugar (less than 70 mg/dL) with  cup of clear juice (cranberry or apple), 4 glucose tablets, OR glucose gel. Recheck blood sugar in 15 minutes after treatment (to make sure it is greater than 70 mg/dL). If your blood sugar is not greater than 70 mg/dL on recheck, call [phone] for further instructions. Report your blood sugar to the short stay nurse when you get to Short Stay.  If  you are admitted to the hospital after surgery: Your blood sugar will be checked by the staff and you will probably be given insulin  after surgery (instead of oral diabetes medicines) to make sure you have good blood sugar levels. The goal for blood sugar control after surgery is 80-180 mg/dL.                      Do NOT Smoke (Tobacco/Vaping) for 24 hours prior to your procedure.  If  you use a CPAP at night, you may bring your mask/headgear for your overnight stay.   You will be asked to remove any contacts, glasses, piercing's, hearing aid's, dentures/partials prior to surgery. Please bring cases for these items if needed.    Patients discharged the day of surgery will not be allowed to drive home, and someone needs to stay with them for 24 hours.  SURGICAL WAITING ROOM VISITATION Patients may have no more than 2 support people in the waiting area - these visitors may rotate.   Pre-op nurse will coordinate an appropriate time for 1 ADULT support person, who may not rotate, to accompany patient in pre-op.  Children under the age of 102 must have an adult with them who is not the patient and must remain in the main waiting area with an adult.  If the patient needs to stay at the hospital during part of their recovery, the visitor guidelines for inpatient rooms apply.  Please refer to the [facility] website for the visitor guidelines for any additional information.   If you received a COVID test during your pre-op visit  it is requested that you wear a mask when out in public, stay away from anyone that may not be feeling well and notify your surgeon if you develop symptoms. If you have been in contact with anyone that has tested positive in the last 10 days please notify you surgeon.      Pre-operative 5 CHG Bathing Instructions   You can play a key role in reducing the risk of infection after surgery. Your skin needs to be as free of germs as possible. You can reduce the number of germs on your skin by washing with CHG (chlorhexidine gluconate) soap before surgery. CHG is an antiseptic soap that kills germs and continues to kill germs even after washing.   DO NOT use if you have an allergy to chlorhexidine/CHG or antibacterial soaps. If your skin becomes reddened or irritated, stop using the CHG and notify one of our RNs at [phone].   Please shower with the CHG  soap starting 4 days before surgery using the following schedule:     Please keep in mind the following:  DO NOT shave, including legs and underarms, starting the day of your first shower.   You may shave your face at any point before/day of surgery.  Place clean sheets on your bed the day you start using CHG soap. Use a clean washcloth (not used since being washed) for each shower. DO NOT sleep with pets once you start using the CHG.   CHG Shower Instructions:  Wash your face and private area with normal soap. If you choose to wash your hair, wash first with your normal shampoo.  After you use shampoo/soap, rinse your hair and body thoroughly to remove shampoo/soap residue.  Turn the water OFF and apply about 3 tablespoons (45 ml) of CHG soap to a CLEAN washcloth.  Apply CHG soap ONLY FROM YOUR NECK DOWN  TO YOUR TOES (washing for 3-5 minutes)  DO NOT use CHG soap on face, private areas, open wounds, or sores.  Pay special attention to the area where your surgery is being performed.  If you are having back surgery, having someone wash your back for you may be helpful. Wait 2 minutes after CHG soap is applied, then you may rinse off the CHG soap.  Pat dry with a clean towel  Put on clean clothes/pajamas   If you choose to wear lotion, please use ONLY the CHG-compatible lotions that are listed below.  Additional instructions for the day of surgery: DO NOT APPLY any lotions, deodorants, cologne, or perfumes.   Do not bring valuables to the hospital. [facility] is not responsible for any belongings/valuables. Do not wear nail polish, gel polish, artificial nails, or any other type of covering on natural nails (fingers and toes) Do not wear jewelry or makeup Put on clean/comfortable clothes.  Please brush your teeth.  Ask your nurse before applying any prescription medications to the skin.     CHG Compatible Lotions   Aveeno Moisturizing lotion  Cetaphil Moisturizing Cream   Cetaphil Moisturizing Lotion  Clairol Herbal Essence Moisturizing Lotion, Dry Skin  Clairol Herbal Essence Moisturizing Lotion, Extra Dry Skin  Clairol Herbal Essence Moisturizing Lotion, Normal Skin  Curel Age Defying Therapeutic Moisturizing Lotion with Alpha Hydroxy  Curel Extreme Care Body Lotion  Curel Soothing Hands Moisturizing Hand Lotion  Curel Therapeutic Moisturizing Cream, Fragrance-Free  Curel Therapeutic Moisturizing Lotion, Fragrance-Free  Curel Therapeutic Moisturizing Lotion, Original Formula  Eucerin Daily Replenishing Lotion  Eucerin Dry Skin Therapy Plus Alpha Hydroxy Crme  Eucerin Dry Skin Therapy Plus Alpha Hydroxy Lotion  Eucerin Original Crme  Eucerin Original Lotion  Eucerin Plus Crme Eucerin Plus Lotion  Eucerin TriLipid Replenishing Lotion  Keri Anti-Bacterial Hand Lotion  Keri Deep Conditioning Original Lotion Dry Skin Formula Softly Scented  Keri Deep Conditioning Original Lotion, Fragrance Free Sensitive Skin Formula  Keri Lotion Fast Absorbing Fragrance Free Sensitive Skin Formula  Keri Lotion Fast Absorbing Softly Scented Dry Skin Formula  Keri Original Lotion  Keri Skin Renewal Lotion Keri Silky Smooth Lotion  Keri Silky Smooth Sensitive Skin Lotion  Nivea Body Creamy Conditioning Oil  Nivea Body Extra Enriched Lotion  Nivea Body Original Lotion  Nivea Body Sheer Moisturizing Lotion Nivea Crme  Nivea Skin Firming Lotion  NutraDerm 30 Skin Lotion  NutraDerm Skin Lotion  NutraDerm Therapeutic Skin Cream  NutraDerm Therapeutic Skin Lotion  ProShield Protective Hand Cream  Provon moisturizing lotion  Please read over the following fact sheets that you were given.

## 2023-11-08 ENCOUNTER — Encounter (HOSPITAL_COMMUNITY): Payer: Self-pay

## 2023-11-08 ENCOUNTER — Other Ambulatory Visit: Payer: Self-pay

## 2023-11-08 ENCOUNTER — Encounter (HOSPITAL_COMMUNITY)
Admission: RE | Admit: 2023-11-08 | Discharge: 2023-11-08 | Disposition: A | Discharge status: Home / Self Care | Source: Ambulatory Visit | Attending: Neurosurgery | Admitting: Neurosurgery

## 2023-11-08 VITALS — BP 142/82 | Temp 98.4°F | Resp 18 | Ht 69.0 in | Wt 271.0 lb

## 2023-11-08 DIAGNOSIS — Z9981 Dependence on supplemental oxygen: Secondary | ICD-10-CM | POA: Diagnosis not present

## 2023-11-08 DIAGNOSIS — J9611 Chronic respiratory failure with hypoxia: Secondary | ICD-10-CM | POA: Diagnosis not present

## 2023-11-08 DIAGNOSIS — J439 Emphysema, unspecified: Secondary | ICD-10-CM | POA: Insufficient documentation

## 2023-11-08 DIAGNOSIS — Z01818 Encounter for other preprocedural examination: Secondary | ICD-10-CM | POA: Diagnosis present

## 2023-11-08 DIAGNOSIS — Z6841 Body Mass Index (BMI) 40.0 and over, adult: Secondary | ICD-10-CM | POA: Diagnosis not present

## 2023-11-08 DIAGNOSIS — M5116 Intervertebral disc disorders with radiculopathy, lumbar region: Secondary | ICD-10-CM | POA: Insufficient documentation

## 2023-11-08 DIAGNOSIS — E1142 Type 2 diabetes mellitus with diabetic polyneuropathy: Secondary | ICD-10-CM | POA: Insufficient documentation

## 2023-11-08 DIAGNOSIS — Z87891 Personal history of nicotine dependence: Secondary | ICD-10-CM | POA: Diagnosis not present

## 2023-11-08 DIAGNOSIS — Z794 Long term (current) use of insulin: Secondary | ICD-10-CM | POA: Diagnosis not present

## 2023-11-08 DIAGNOSIS — E66813 Obesity, class 3: Secondary | ICD-10-CM | POA: Insufficient documentation

## 2023-11-08 HISTORY — DX: Anxiety disorder, unspecified: F41.9

## 2023-11-08 HISTORY — DX: Depression, unspecified: F32.A

## 2023-11-08 HISTORY — DX: Sleep apnea, unspecified: G47.30

## 2023-11-08 LAB — BASIC METABOLIC PANEL WITH GFR
Anion gap: 8 (ref 5–15)
BUN: 8 mg/dL (ref 6–20)
CO2: 34 mmol/L — ABNORMAL HIGH (ref 22–32)
Calcium: 9.5 mg/dL (ref 8.9–10.3)
Chloride: 97 mmol/L — ABNORMAL LOW (ref 98–111)
Creatinine, Ser: 0.83 mg/dL (ref 0.44–1.00)
GFR, Estimated: >60 mL/min (ref >60)
Glucose, Bld: 273 mg/dL — ABNORMAL HIGH (ref 70–99)
Potassium: 4.8 mmol/L (ref 3.5–5.1)
Sodium: 139 mmol/L (ref 135–145)

## 2023-11-08 LAB — SURGICAL PCR SCREEN
MRSA, PCR: NEGATIVE
Staphylococcus aureus: NEGATIVE

## 2023-11-08 LAB — CBC
HCT: 42.9 % (ref 36.0–46.0)
Hemoglobin: 13.9 g/dL (ref 12.0–15.0)
MCH: 30.6 pg (ref 26.0–34.0)
MCHC: 32.4 g/dL (ref 30.0–36.0)
MCV: 94.5 fL (ref 80.0–100.0)
Platelets: 226 10*3/uL (ref 150–400)
RBC: 4.54 MIL/uL (ref 3.87–5.11)
RDW: 12 % (ref 11.5–15.5)
WBC: 6 10*3/uL (ref 4.0–10.5)
nRBC: 0 % (ref 0.0–0.2)

## 2023-11-08 LAB — HEMOGLOBIN A1C
Hgb A1c MFr Bld: 9.6 % — ABNORMAL HIGH (ref 4.8–5.6)
Mean Plasma Glucose: 228.82 mg/dL

## 2023-11-08 LAB — GLUCOSE, CAPILLARY: Glucose-Capillary: 281 mg/dL — ABNORMAL HIGH (ref 70–99)

## 2023-11-08 NOTE — Progress Notes (Signed)
 PCP - Prentice Batch, FNP Cardiologist - Dr. [name], LOV 12/12/2022 Pulmonologist: Dr. Harden Staff Endocrinologist. Dr. Faythe [facility]  PPM/ICD - denies Device Orders - na Rep Notified - na  Chest x-ray - 06/22/2023 EKG - PAT, 11/08/2023 Stress Test - 10/27/20220 ECHO - 12/12/2022 Cardiac Cath -   Sleep Study - mild sleep apnea, uses home oxygen, 2L/[place] CPAP - na  Type II diabetic.  Blood sugar 281 at PAT appointment, states she forgot to take her insulin .  A1C drawn at PAT appointment. CGM, Dexcom 7 to left arm Fasting Blood Sugar - 105-190 Checks Blood Sugar: continuous  Last dose of GLP1 agonist-  denies GLP1 instructions: na  Blood Thinner Instructions: denies Aspirin  Instructions:denies  ERAS Protcol - NPO  Anesthesia review: Yes. DM, asthma, COPD, SOB, emphysema, home oxygen, SVT,   Patient denies shortness of breath, fever, cough and chest pain at PAT appointment   All instructions explained to the patient, with a verbal understanding of the material. Patient agrees to go over the instructions while at home for a better understanding. Patient also instructed to self quarantine after being tested for COVID-19. The opportunity to ask questions was provided.

## 2023-11-09 ENCOUNTER — Encounter (HOSPITAL_BASED_OUTPATIENT_CLINIC_OR_DEPARTMENT_OTHER): Payer: Self-pay | Admitting: Pulmonary Disease

## 2023-11-09 ENCOUNTER — Ambulatory Visit (HOSPITAL_BASED_OUTPATIENT_CLINIC_OR_DEPARTMENT_OTHER): Admitting: Pulmonary Disease

## 2023-11-09 VITALS — BP 136/82 | HR 75 | Temp 97.7°F | Ht 69.0 in | Wt 271.8 lb

## 2023-11-09 DIAGNOSIS — Z87891 Personal history of nicotine dependence: Secondary | ICD-10-CM | POA: Diagnosis not present

## 2023-11-09 DIAGNOSIS — Z01818 Encounter for other preprocedural examination: Secondary | ICD-10-CM

## 2023-11-09 DIAGNOSIS — J9611 Chronic respiratory failure with hypoxia: Secondary | ICD-10-CM

## 2023-11-09 NOTE — Progress Notes (Signed)
 Subjective:    Patient ID: Veronica Rivers, female    DOB: Dec 12, 1965, 58 y.o.   MRN: [id number]  HPI  58 yo remote smoker for FU of asthma/emphysema & noct hypoxia She smoked about 1.5 packs/day before she quit in 99 about 25 pack years.   PMH - hypertension,  obstructive sleep apnea,  type 2 diabetes,  bipolar depression    She reports a diagnosis of asthma in her 40s triggered by exercise and weather changes and chest colds. She underwent detailed allergy testing which was negative. 4  Cats + 1 dog  in the house.   Meds - She was on Symbicort  x years, changed to [facility] in 2017.  Incruse was stopped after her last visit 04/2018 showing good PFTs   PMH - esophageal spasm , chest pain >> negative stress test and CT angiogram   Meds - did not tolerate BREO due to nausea/vomiting      12/2022 On ambulation without oxygen, saturation dropped to 87% on record with oxygen and stayed at 96% on 2 L POC  Chief Complaint  Patient presents with   Follow-up    L2, L3 surgery on 11-16-23 at [facility] with Dr Mavis.    presents for follow-up and pre-operative evaluation for planned L2-L3 microdiscectomy.  She uses oxygen therapy and can maintain adequate saturation for about two hours without it before levels drop to 77%, leading to symptoms. Her lung capacity remains relatively stable. She is actively working on weight loss, currently at 271 pounds, to improve respiratory function.  Her medications include Symbicort , which is effective, and she needs a refill of albuterol  with only sixteen puffs remaining. She takes furosemide  as needed for leg swelling. She previously discontinued a GLP-1 receptor agonist due to nausea and vomiting.  Significant tests/ events reviewed   11/2022 CT angiogram patchy GGO left more than right upper lobe 02/2019 CT angiogram negative 10/2017 CT angiogram -no  evidence of pulmonary embolus,changes of bullous emphysema in apices   Echo  bubble study 11/2022 -negative, RVSP 35     PFT 11/14/2022 >> FVC 2.88 (70%), FEV1 2.45 (76%), ratio 85, DLCO 21.81 (88%)  04/2018 PFTs ratio normal, FEV1 82%, TLC 89%, DLCO 85%   HST at [facility] medical 02/2020 -very mild OSA , RDI 8/h,, methylphenidate for excessive daytime somnolence   - oxygen saturation lowest 74%, 301 minutes less than 89%  Review of Systems neg for any significant sore throat, dysphagia, itching, sneezing, nasal congestion or excess/ purulent secretions, fever, chills, sweats, unintended wt loss, pleuritic or exertional cp, hempoptysis, orthopnea pnd or change in chronic leg swelling. Also denies presyncope, palpitations, heartburn, abdominal pain, nausea, vomiting, diarrhea or change in bowel or urinary habits, dysuria,hematuria, rash, arthralgias, visual complaints, headache, numbness weakness or ataxia.     Objective:   Physical Exam  Gen. Pleasant, obese, in no distress ENT - no lesions, no post nasal drip Neck: No JVD, no thyromegaly, no carotid bruits Lungs: no use of accessory muscles, no dullness to percussion, decreased without rales or rhonchi  Cardiovascular: Rhythm regular, heart sounds  normal, no murmurs or gallops, no peripheral edema Musculoskeletal: No deformities, no cyanosis or clubbing , no tremors       Assessment & Plan:   Chronic respiratory failure with hypoxia Chronic respiratory failure with hypoxia, requiring continuous oxygen therapy. Oxygen saturation drops to 77% after two hours without supplemental oxygen. No other causes for hypoxia identified despite evaluation, including cardiac causes. Weight  is 271 pounds, which may contribute to respiratory issues, but not definitively linked to hypoxia. Symbicort  is effective for managing symptoms. Previous trial of Ozempic was not tolerated due to nausea and vomiting. - Continue Symbicort  for asthma - Refill albuterol  inhaler - Encourage weight loss to potentially improve respiratory  function - Consider future GLP-1 agonists with reduced nausea side effects  Planned lumbar disc surgery Scheduled for microdiscectomy at L2-L3 on July 10th due to a ruptured disc and pinched nerve. Surgery involves being prone and is expected to last 1-2 hours. Post-operative mobilization is crucial to prevent complications such as pneumonia. Cleared for surgery from a pulmonary standpoint, with understanding of inherent surgical risks. No major cardiac issues present. Informed consent includes understanding the risks of anesthesia and the importance of early mobilization to prevent pneumonia. - Proceed with planned lumbar disc surgery - Ensure post-operative mobilization to reduce risk of pneumonia - Communicate surgical clearance to Dr. Mavis   Major Pulmonary risks identified in the multifactorial risk analysis are but not limited to a) pneumonia; b) recurrent intubation risk; c) prolonged or recurrent acute respiratory failure needing mechanical ventilation; d) prolonged hospitalization; e) DVT/Pulmonary embolism; f) Acute Pulmonary edema   Recommend 1. Short duration of surgery as much as possible and avoid paralytic if possible 2. DVT prophylaxis as indicated  4. Aggressive pulmonary toilet with o2, bronchodilatation, and incentive spirometry and early ambulation

## 2023-11-09 NOTE — Patient Instructions (Signed)
  Cleared for surgery with due risk  Good luck!  X Refill on albuterol  MDI

## 2023-11-13 NOTE — Progress Notes (Addendum)
 Anesthesia Chart Review:  58 year old female follows with cardiology for history of HLD, SVT.  Myoview  03/05/2019 was normal, low risk.  Echo 12/12/2022 showed EF 60 to 65%, normal RV, no significant valvular abnormalities.  Last seen by Dr. Delford on 12/12/2022, stable from cardiac standpoint, 1 year follow-up recommended.  Follows with pulmonology for history of former smoker (quit 1999), asthma/emphysema, OSA, chronic respiratory failure with hypoxia.  Seen by Dr. Jude 11/09/2023 for preop eval.  Per note, Chronic respiratory failure with hypoxia, requiring continuous oxygen therapy. Oxygen saturation drops to 77% after two hours without supplemental oxygen. No other causes for hypoxia identified despite evaluation, including cardiac causes. Weight is 271 pounds, which may contribute to respiratory issues, but not definitively linked to hypoxia. Symbicort  is effective for managing symptoms. Previous trial of Ozempic was not tolerated due to nausea and vomiting. - Continue Symbicort  for asthma - Refill albuterol  inhaler - Encourage weight loss to potentially improve respiratory function - Consider future GLP-1 agonists with reduced nausea side effects   Planned lumbar disc surgery: Scheduled for microdiscectomy at L2-L3 on July 10th due to a ruptured disc and pinched nerve. Surgery involves being prone and is expected to last 1-2 hours. Post-operative mobilization is crucial to prevent complications such as pneumonia. Cleared for surgery from a pulmonary standpoint, with understanding of inherent surgical risks. No major cardiac issues present. Informed consent includes understanding the risks of anesthesia and the importance of early mobilization to prevent pneumonia. -Proceed with planned lumbar disc surgery - Ensure post-operative mobilization to reduce risk of pneumonia - Communicate surgical clearance to Dr. Mavis  Other pertinent history includes hypothyroidism, IDDM 2, bipolar depression, class III  obesity BMI 40.  Preop labs reviewed, DM2 poorly controlled, glucose 273, A1c 9.6.  Otherwise unremarkable.  EKG 11/08/23: Normal sinus rhythm.  Rate 62. Septal infarct , age undetermined  PFTs 11/14/2022: FVC-%Pred-Pre % 67  FEV1-%Pred-Pre % 72  FEV1FVC-%Pred-Pre % 105  TLC % pred % 88  RV % pred % 86  DLCO unc % pred % 88   TTE 12/12/23: 1. Left ventricular ejection fraction, by estimation, is 60 to 65%. Left  ventricular ejection fraction by 3D volume is 63 %. The left ventricle has  normal function. The left ventricle has no regional wall motion  abnormalities. Left ventricular diastolic   parameters were normal.   2. Right ventricular systolic function is normal. The right ventricular  size is normal. There is normal pulmonary artery systolic pressure. The  estimated right ventricular systolic pressure is 35.0 mmHg.   3. Left atrial size was mildly dilated.   4. Right atrial size was moderately dilated.   5. The mitral valve is grossly normal. Trivial mitral valve  regurgitation.   6. The aortic valve is tricuspid. Aortic valve regurgitation is not  visualized.   7. The inferior vena cava is dilated in size with >50% respiratory  variability, suggesting right atrial pressure of 8 mmHg.   8. Agitated saline contrast bubble study was negative, with no evidence  of any interatrial shunt.   Comparison(s): Changes from prior study are noted. 12/11/2017: LVEF 55-60%,  grade 1 DD.   Nuclear stress 03/05/2019: There was no ST segment deviation noted during stress. Nuclear stress EF: 63%. The left ventricular ejection fraction is normal (55-65%). The study is normal. This is a low risk study.   Fixed apical inferolateral defect with normal wall motion in that region, suggestive of artifact    Lynwood Hope, PA-C [facility] Short Stay  Center/Anesthesiology Phone [phone] 11/13/2023 1:09 PM

## 2023-11-13 NOTE — Anesthesia Preprocedure Evaluation (Signed)
 Anesthesia Evaluation  Patient identified by MRN, date of birth, ID band Patient awake    Reviewed: Allergy & Precautions, NPO status , Patient's Chart, lab work & pertinent test results  Airway Mallampati: II  TM Distance: >3 FB Neck ROM: Full    Dental no notable dental hx.    Pulmonary asthma , sleep apnea , COPD,  COPD inhaler and oxygen dependent, former smoker   Pulmonary exam normal        Cardiovascular hypertension,  Rhythm:Regular Rate:Normal     Neuro/Psych   Anxiety Depression Bipolar Disorder   negative neurological ROS     GI/Hepatic negative GI ROS, Neg liver ROS,,,  Endo/Other  diabetes, Insulin  DependentHypothyroidism    Renal/GU   negative genitourinary   Musculoskeletal  (+) Arthritis , Osteoarthritis,    Abdominal Normal abdominal exam  (+)   Peds  Hematology Lab Results      Component                Value               Date                      WBC                      6.0                 11/08/2023                HGB                      13.9                11/08/2023                HCT                      42.9                11/08/2023                MCV                      94.5                11/08/2023                PLT                      226                 11/08/2023              Anesthesia Other Findings   Reproductive/Obstetrics                              Anesthesia Physical Anesthesia Plan  ASA: 3  Anesthesia Plan: General   Post-op Pain Management: Tylenol  PO (pre-op)*   Induction: Intravenous  PONV Risk Score and Plan: 3 and Ondansetron , Dexamethasone , Midazolam  and Treatment may vary due to age or medical condition  Airway Management Planned: Mask and Oral ETT  Additional Equipment: None  Intra-op Plan:   Post-operative Plan: Extubation in OR  Informed Consent: I have reviewed the patients History and Physical, chart, labs and  discussed the procedure including  the risks, benefits and alternatives for the proposed anesthesia with the patient or authorized representative who has indicated his/her understanding and acceptance.     Dental advisory given  Plan Discussed with: CRNA  Anesthesia Plan Comments: (PAT note by Lynwood Hope, PA-C:  58 year old female follows with cardiology for history of HLD, SVT.  Myoview  03/05/2019 was normal, low risk.  Echo 12/12/2022 showed EF 60 to 65%, normal RV, no significant valvular abnormalities.  Last seen by Dr. Delford on 12/12/2022, stable from cardiac standpoint, 1 year follow-up recommended.  Follows with pulmonology for history of former smoker (quit 1999), asthma/emphysema, OSA, chronic respiratory failure with hypoxia.  Seen by Dr. Jude 11/09/2023 for preop eval.  Per note, Chronic respiratory failure with hypoxia, requiring continuous oxygen therapy. Oxygen saturation drops to 77% after two hours without supplemental oxygen. No other causes for hypoxia identified despite evaluation, including cardiac causes. Weight is 271 pounds, which may contribute to respiratory issues, but not definitively linked to hypoxia. Symbicort  is effective for managing symptoms. Previous trial of Ozempic was not tolerated due to nausea and vomiting. - Continue Symbicort  for asthma - Refill albuterol  inhaler - Encourage weight loss to potentially improve respiratory function - Consider future GLP-1 agonists with reduced nausea side effects  Planned lumbar disc surgery: Scheduled for microdiscectomy at L2-L3 on July 10th due to a ruptured disc and pinched nerve. Surgery involves being prone and is expected to last 1-2 hours. Post-operative mobilization is crucial to prevent complications such as pneumonia. Cleared for surgery from a pulmonary standpoint, with understanding of inherent surgical risks. No major cardiac issues present. Informed consent includes understanding the risks of anesthesia and the  importance of early mobilization to prevent pneumonia. -Proceed with planned lumbar disc surgery - Ensure post-operative mobilization to reduce risk of pneumonia - Communicate surgical clearance to Dr. Mavis  Other pertinent history includes hypothyroidism, IDDM 2, bipolar depression, class III obesity BMI 40.  Preop labs reviewed, DM2 poorly controlled, glucose 273, A1c 9.6.  Otherwise unremarkable.  EKG 11/08/23: Normal sinus rhythm.  Rate 62. Septal infarct , age undetermined  PFTs 11/14/2022: FVC-%Pred-Pre % 67 FEV1-%Pred-Pre % 72 FEV1FVC-%Pred-Pre % 105 TLC % pred % 88 RV % pred % 86 DLCO unc % pred % 88  TTE 12/12/23: 1. Left ventricular ejection fraction, by estimation, is 60 to 65%. Left  ventricular ejection fraction by 3D volume is 63 %. The left ventricle has  normal function. The left ventricle has no regional wall motion  abnormalities. Left ventricular diastolic  parameters were normal.  2. Right ventricular systolic function is normal. The right ventricular  size is normal. There is normal pulmonary artery systolic pressure. The  estimated right ventricular systolic pressure is 35.0 mmHg.  3. Left atrial size was mildly dilated.  4. Right atrial size was moderately dilated.  5. The mitral valve is grossly normal. Trivial mitral valve  regurgitation.  6. The aortic valve is tricuspid. Aortic valve regurgitation is not  visualized.  7. The inferior vena cava is dilated in size with >50% respiratory  variability, suggesting right atrial pressure of 8 mmHg.  8. Agitated saline contrast bubble study was negative, with no evidence  of any interatrial shunt.   Comparison(s): Changes from prior study are noted. 12/11/2017: LVEF 55-60%,  grade 1 DD.   Nuclear stress 03/05/2019:  There was no ST segment deviation noted during stress.  Nuclear stress EF: 63%.  The left ventricular ejection fraction is normal (55-65%).  The study is normal.  This is a low risk  study.   Fixed apical inferolateral defect with normal wall motion in that region, suggestive of artifact   )         Anesthesia Quick Evaluation

## 2023-11-15 NOTE — Telephone Encounter (Signed)
 Copy of ov note 11/09/23 was faxed to [facility]

## 2023-11-16 ENCOUNTER — Encounter (HOSPITAL_COMMUNITY): Payer: Self-pay | Admitting: Neurosurgery

## 2023-11-16 ENCOUNTER — Ambulatory Visit (HOSPITAL_COMMUNITY)

## 2023-11-16 ENCOUNTER — Ambulatory Visit (HOSPITAL_COMMUNITY): Payer: Self-pay | Admitting: Physician Assistant

## 2023-11-16 ENCOUNTER — Other Ambulatory Visit: Payer: Self-pay

## 2023-11-16 ENCOUNTER — Ambulatory Visit (HOSPITAL_BASED_OUTPATIENT_CLINIC_OR_DEPARTMENT_OTHER): Payer: Self-pay | Admitting: Certified Registered"

## 2023-11-16 ENCOUNTER — Encounter (HOSPITAL_COMMUNITY)
Admission: RE | Disposition: A | Discharge status: Home / Self Care | Payer: Self-pay | Source: Home / Self Care | Attending: Neurosurgery

## 2023-11-16 ENCOUNTER — Ambulatory Visit (HOSPITAL_COMMUNITY)
Admission: RE | Admit: 2023-11-16 | Discharge: 2023-11-17 | Disposition: A | Discharge status: Home / Self Care | Attending: Neurosurgery | Admitting: Neurosurgery

## 2023-11-16 DIAGNOSIS — F319 Bipolar disorder, unspecified: Secondary | ICD-10-CM | POA: Insufficient documentation

## 2023-11-16 DIAGNOSIS — F419 Anxiety disorder, unspecified: Secondary | ICD-10-CM | POA: Diagnosis not present

## 2023-11-16 DIAGNOSIS — G4730 Sleep apnea, unspecified: Secondary | ICD-10-CM | POA: Diagnosis not present

## 2023-11-16 DIAGNOSIS — E1142 Type 2 diabetes mellitus with diabetic polyneuropathy: Secondary | ICD-10-CM

## 2023-11-16 DIAGNOSIS — J449 Chronic obstructive pulmonary disease, unspecified: Secondary | ICD-10-CM

## 2023-11-16 DIAGNOSIS — M5116 Intervertebral disc disorders with radiculopathy, lumbar region: Secondary | ICD-10-CM | POA: Diagnosis present

## 2023-11-16 DIAGNOSIS — Z87891 Personal history of nicotine dependence: Secondary | ICD-10-CM | POA: Diagnosis not present

## 2023-11-16 DIAGNOSIS — J439 Emphysema, unspecified: Secondary | ICD-10-CM | POA: Diagnosis not present

## 2023-11-16 DIAGNOSIS — E1140 Type 2 diabetes mellitus with diabetic neuropathy, unspecified: Secondary | ICD-10-CM | POA: Insufficient documentation

## 2023-11-16 DIAGNOSIS — E039 Hypothyroidism, unspecified: Secondary | ICD-10-CM | POA: Insufficient documentation

## 2023-11-16 DIAGNOSIS — Z794 Long term (current) use of insulin: Secondary | ICD-10-CM | POA: Diagnosis not present

## 2023-11-16 DIAGNOSIS — M5126 Other intervertebral disc displacement, lumbar region: Secondary | ICD-10-CM | POA: Diagnosis present

## 2023-11-16 DIAGNOSIS — I10 Essential (primary) hypertension: Secondary | ICD-10-CM | POA: Diagnosis not present

## 2023-11-16 DIAGNOSIS — Z9981 Dependence on supplemental oxygen: Secondary | ICD-10-CM | POA: Insufficient documentation

## 2023-11-16 HISTORY — PX: LUMBAR LAMINECTOMY/DECOMPRESSION MICRODISCECTOMY: SHX5026

## 2023-11-16 LAB — GLUCOSE, CAPILLARY
Glucose-Capillary: 203 mg/dL — ABNORMAL HIGH (ref 70–99)
Glucose-Capillary: 178 mg/dL — ABNORMAL HIGH (ref 70–99)
Glucose-Capillary: 154 mg/dL — ABNORMAL HIGH (ref 70–99)
Glucose-Capillary: 299 mg/dL — ABNORMAL HIGH (ref 70–99)
Glucose-Capillary: 187 mg/dL — ABNORMAL HIGH (ref 70–99)

## 2023-11-16 SURGERY — LUMBAR LAMINECTOMY/DECOMPRESSION MICRODISCECTOMY 1 LEVEL
Anesthesia: General | Laterality: Right

## 2023-11-16 MED ORDER — PROPOFOL 10 MG/ML IV BOLUS
INTRAVENOUS | Status: DC | PRN
  Administered 2023-11-16: 160 mg via INTRAVENOUS

## 2023-11-16 MED ORDER — LACTATED RINGERS IV SOLN: INTRAVENOUS | Status: DC

## 2023-11-16 MED ORDER — SENNA 8.6 MG PO TABS
1.0000 | ORAL_TABLET | Freq: Two times a day (BID) | ORAL | Status: DC | PRN
  Administered 2023-11-16: 8.6 mg via ORAL
  Filled 2023-11-16: qty 1

## 2023-11-16 MED ORDER — OXYCODONE HCL 5 MG/5ML PO SOLN: 5.0000 mg | Freq: Once | ORAL | Status: DC | PRN

## 2023-11-16 MED ORDER — ALBUTEROL SULFATE HFA 108 (90 BASE) MCG/ACT IN AERS: 1.0000 | INHALATION_SPRAY | RESPIRATORY_TRACT | Status: DC | PRN

## 2023-11-16 MED ORDER — 0.9 % SODIUM CHLORIDE (POUR BTL) OPTIME
TOPICAL | Status: DC | PRN
  Administered 2023-11-16: 1000 mL

## 2023-11-16 MED ORDER — DULOXETINE HCL 60 MG PO CPEP
90.0000 mg | ORAL_CAPSULE | Freq: Every day | ORAL | Status: DC
  Administered 2023-11-16: 90 mg via ORAL
  Filled 2023-11-16: qty 1

## 2023-11-16 MED ORDER — BUPIVACAINE-EPINEPHRINE (PF) 0.5% -1:200000 IJ SOLN
INTRAMUSCULAR | Status: AC
  Filled 2023-11-16: qty 30

## 2023-11-16 MED ORDER — BACITRACIN ZINC 500 UNIT/GM EX OINT
TOPICAL_OINTMENT | CUTANEOUS | Status: DC | PRN
  Administered 2023-11-16: 1 via TOPICAL

## 2023-11-16 MED ORDER — ORAL CARE MOUTH RINSE: 15.0000 mL | Freq: Once | OROMUCOSAL | Status: AC

## 2023-11-16 MED ORDER — PHENOL 1.4 % MT LIQD: 1.0000 | OROMUCOSAL | Status: DC | PRN

## 2023-11-16 MED ORDER — ACETAMINOPHEN 500 MG PO TABS
1000.0000 mg | ORAL_TABLET | Freq: Four times a day (QID) | ORAL | Status: DC
  Administered 2023-11-16 (×3): 500 mg via ORAL
  Filled 2023-11-16 (×3): qty 2

## 2023-11-16 MED ORDER — INSULIN ASPART 100 UNIT/ML IJ SOLN
0.0000 [IU] | INTRAMUSCULAR | Status: DC | PRN
  Administered 2023-11-16: 4 [IU] via SUBCUTANEOUS

## 2023-11-16 MED ORDER — CEFAZOLIN SODIUM-DEXTROSE 2-4 GM/100ML-% IV SOLN
2.0000 g | Freq: Three times a day (TID) | INTRAVENOUS | Status: AC
  Administered 2023-11-16 (×2): 2 g via INTRAVENOUS
  Filled 2023-11-16 (×2): qty 100

## 2023-11-16 MED ORDER — OXYCODONE-ACETAMINOPHEN 10-325 MG PO TABS: 1.0000 | ORAL_TABLET | ORAL | Status: DC | PRN

## 2023-11-16 MED ORDER — BUPIVACAINE-EPINEPHRINE (PF) 0.5% -1:200000 IJ SOLN
INTRAMUSCULAR | Status: DC | PRN
  Administered 2023-11-16: 10 mL via PERINEURAL
  Administered 2023-11-16: 10 mL

## 2023-11-16 MED ORDER — PROPOFOL 10 MG/ML IV BOLUS
INTRAVENOUS | Status: AC
  Filled 2023-11-16: qty 20

## 2023-11-16 MED ORDER — DOCUSATE SODIUM 100 MG PO CAPS
100.0000 mg | ORAL_CAPSULE | Freq: Two times a day (BID) | ORAL | Status: DC
Start: 2023-11-16 — End: 2023-11-17
  Administered 2023-11-16 – 2023-11-17 (×3): 100 mg via ORAL
  Filled 2023-11-16 (×3): qty 1

## 2023-11-16 MED ORDER — ROCURONIUM BROMIDE 10 MG/ML (PF) SYRINGE
PREFILLED_SYRINGE | INTRAVENOUS | Status: AC
  Filled 2023-11-16: qty 10

## 2023-11-16 MED ORDER — MORPHINE SULFATE (PF) 2 MG/ML IV SOLN
2.0000 mg | INTRAVENOUS | Status: DC | PRN
  Administered 2023-11-16 (×2): 4 mg via INTRAVENOUS
  Filled 2023-11-16 (×2): qty 2

## 2023-11-16 MED ORDER — BACITRACIN ZINC 500 UNIT/GM EX OINT
TOPICAL_OINTMENT | CUTANEOUS | Status: AC
Start: 2023-11-16 — End: 2023-11-16
  Filled 2023-11-16: qty 28.35

## 2023-11-16 MED ORDER — FENTANYL CITRATE (PF) 250 MCG/5ML IJ SOLN
INTRAMUSCULAR | Status: AC
  Filled 2023-11-16: qty 5

## 2023-11-16 MED ORDER — CARVEDILOL 12.5 MG PO TABS
12.5000 mg | ORAL_TABLET | Freq: Two times a day (BID) | ORAL | Status: DC
  Administered 2023-11-16: 12.5 mg via ORAL
  Filled 2023-11-16: qty 1

## 2023-11-16 MED ORDER — OXYCODONE HCL 5 MG PO TABS: 5.0000 mg | ORAL_TABLET | Freq: Once | ORAL | Status: DC | PRN

## 2023-11-16 MED ORDER — AMPHETAMINE-DEXTROAMPHETAMINE 10 MG PO TABS
20.0000 mg | ORAL_TABLET | Freq: Every morning | ORAL | Status: DC
  Administered 2023-11-16 – 2023-11-17 (×2): 20 mg via ORAL
  Filled 2023-11-16 (×2): qty 2

## 2023-11-16 MED ORDER — FAMOTIDINE 20 MG PO TABS
40.0000 mg | ORAL_TABLET | Freq: Every day | ORAL | Status: DC
  Administered 2023-11-16: 40 mg via ORAL
  Filled 2023-11-16: qty 2

## 2023-11-16 MED ORDER — OXYCODONE HCL 5 MG PO TABS
5.0000 mg | ORAL_TABLET | ORAL | Status: DC | PRN
  Administered 2023-11-16 – 2023-11-17 (×6): 5 mg via ORAL
  Filled 2023-11-16 (×6): qty 1

## 2023-11-16 MED ORDER — NITROGLYCERIN 0.4 MG SL SUBL: 0.4000 mg | SUBLINGUAL_TABLET | SUBLINGUAL | Status: DC | PRN

## 2023-11-16 MED ORDER — LAMOTRIGINE 100 MG PO TABS
300.0000 mg | ORAL_TABLET | Freq: Every day | ORAL | Status: DC
  Administered 2023-11-16: 300 mg via ORAL
  Filled 2023-11-16: qty 3

## 2023-11-16 MED ORDER — ONDANSETRON HCL 4 MG PO TABS: 4.0000 mg | ORAL_TABLET | Freq: Four times a day (QID) | ORAL | Status: DC | PRN

## 2023-11-16 MED ORDER — SODIUM CHLORIDE 0.9% FLUSH
3.0000 mL | Freq: Two times a day (BID) | INTRAVENOUS | Status: DC
Start: 2023-11-16 — End: 2023-11-17
  Administered 2023-11-16: 3 mL via INTRAVENOUS

## 2023-11-16 MED ORDER — METHOCARBAMOL 500 MG PO TABS
500.0000 mg | ORAL_TABLET | Freq: Every day | ORAL | Status: DC
  Administered 2023-11-16: 500 mg via ORAL
  Filled 2023-11-16: qty 1

## 2023-11-16 MED ORDER — MENTHOL 3 MG MT LOZG
1.0000 | LOZENGE | OROMUCOSAL | Status: DC | PRN
Start: 2023-11-16 — End: 2023-11-17

## 2023-11-16 MED ORDER — THROMBIN 5000 UNITS EX KIT
PACK | CUTANEOUS | Status: AC
  Filled 2023-11-16: qty 1

## 2023-11-16 MED ORDER — CHLORHEXIDINE GLUCONATE 0.12 % MT SOLN
15.0000 mL | Freq: Once | OROMUCOSAL | Status: AC
  Administered 2023-11-16: 15 mL via OROMUCOSAL
  Filled 2023-11-16: qty 15

## 2023-11-16 MED ORDER — QUETIAPINE FUMARATE 200 MG PO TABS
400.0000 mg | ORAL_TABLET | Freq: Every day | ORAL | Status: DC
  Administered 2023-11-16: 400 mg via ORAL
  Filled 2023-11-16: qty 2

## 2023-11-16 MED ORDER — THROMBIN 5000 UNITS EX SOLR
OROMUCOSAL | Status: DC | PRN
  Administered 2023-11-16: 5 mL via TOPICAL

## 2023-11-16 MED ORDER — LEVOTHYROXINE SODIUM 25 MCG PO TABS
125.0000 ug | ORAL_TABLET | Freq: Every day | ORAL | Status: DC
  Administered 2023-11-17: 125 ug via ORAL
  Filled 2023-11-16: qty 1

## 2023-11-16 MED ORDER — CHLORHEXIDINE GLUCONATE CLOTH 2 % EX PADS: 6.0000 | MEDICATED_PAD | Freq: Once | CUTANEOUS | Status: DC

## 2023-11-16 MED ORDER — MONTELUKAST SODIUM 10 MG PO TABS
10.0000 mg | ORAL_TABLET | Freq: Every day | ORAL | Status: DC
  Administered 2023-11-16: 10 mg via ORAL
  Filled 2023-11-16: qty 1

## 2023-11-16 MED ORDER — FUROSEMIDE 20 MG PO TABS
20.0000 mg | ORAL_TABLET | Freq: Every day | ORAL | Status: DC
  Filled 2023-11-16 (×2): qty 1

## 2023-11-16 MED ORDER — FLUTICASONE FUROATE-VILANTEROL 200-25 MCG/ACT IN AEPB
1.0000 | INHALATION_SPRAY | Freq: Every day | RESPIRATORY_TRACT | Status: DC
  Filled 2023-11-16: qty 28

## 2023-11-16 MED ORDER — ACETAMINOPHEN 650 MG RE SUPP: 650.0000 mg | RECTAL | Status: DC | PRN

## 2023-11-16 MED ORDER — FENTANYL CITRATE (PF) 100 MCG/2ML IJ SOLN
INTRAMUSCULAR | Status: AC
  Filled 2023-11-16: qty 2

## 2023-11-16 MED ORDER — LIDOCAINE 2% (20 MG/ML) 5 ML SYRINGE
INTRAMUSCULAR | Status: DC | PRN
  Administered 2023-11-16: 60 mg via INTRAVENOUS

## 2023-11-16 MED ORDER — ACETAMINOPHEN 325 MG PO TABS: 650.0000 mg | ORAL_TABLET | ORAL | Status: DC | PRN

## 2023-11-16 MED ORDER — LIDOCAINE 2% (20 MG/ML) 5 ML SYRINGE
INTRAMUSCULAR | Status: AC
  Filled 2023-11-16: qty 5

## 2023-11-16 MED ORDER — BUPROPION HCL ER (XL) 300 MG PO TB24
450.0000 mg | ORAL_TABLET | Freq: Every morning | ORAL | Status: DC
  Administered 2023-11-17: 450 mg via ORAL
  Filled 2023-11-16: qty 1

## 2023-11-16 MED ORDER — ACETAMINOPHEN 10 MG/ML IV SOLN
INTRAVENOUS | Status: DC | PRN
Start: 2023-11-16 — End: 2023-11-16
  Administered 2023-11-16: 1000 mg via INTRAVENOUS

## 2023-11-16 MED ORDER — KETAMINE HCL 50 MG/5ML IJ SOSY
PREFILLED_SYRINGE | INTRAMUSCULAR | Status: AC
  Filled 2023-11-16: qty 5

## 2023-11-16 MED ORDER — ROCURONIUM BROMIDE 10 MG/ML (PF) SYRINGE
PREFILLED_SYRINGE | INTRAVENOUS | Status: DC | PRN
  Administered 2023-11-16: 30 mg via INTRAVENOUS
  Administered 2023-11-16: 60 mg via INTRAVENOUS

## 2023-11-16 MED ORDER — INSULIN ASPART 100 UNIT/ML IJ SOLN
INTRAMUSCULAR | Status: AC
  Filled 2023-11-16: qty 1

## 2023-11-16 MED ORDER — DROPERIDOL 2.5 MG/ML IJ SOLN: 0.6250 mg | Freq: Once | INTRAMUSCULAR | Status: DC | PRN

## 2023-11-16 MED ORDER — OXYCODONE-ACETAMINOPHEN 5-325 MG PO TABS
1.0000 | ORAL_TABLET | ORAL | Status: DC | PRN
  Administered 2023-11-16 – 2023-11-17 (×6): 1 via ORAL
  Filled 2023-11-16 (×6): qty 1

## 2023-11-16 MED ORDER — PROMETHAZINE HCL 12.5 MG RE SUPP: 12.5000 mg | Freq: Two times a day (BID) | RECTAL | Status: DC | PRN

## 2023-11-16 MED ORDER — KETAMINE HCL 10 MG/ML IJ SOLN
INTRAMUSCULAR | Status: DC | PRN
  Administered 2023-11-16 (×2): 25 mg via INTRAVENOUS

## 2023-11-16 MED ORDER — FENTANYL CITRATE (PF) 250 MCG/5ML IJ SOLN
INTRAMUSCULAR | Status: DC | PRN
  Administered 2023-11-16: 100 ug via INTRAVENOUS

## 2023-11-16 MED ORDER — SODIUM CHLORIDE 0.9% FLUSH: 3.0000 mL | INTRAVENOUS | Status: DC | PRN

## 2023-11-16 MED ORDER — ARIPIPRAZOLE 5 MG PO TABS
15.0000 mg | ORAL_TABLET | Freq: Every day | ORAL | Status: DC
  Administered 2023-11-16: 15 mg via ORAL
  Filled 2023-11-16: qty 1

## 2023-11-16 MED ORDER — ALBUTEROL SULFATE (2.5 MG/3ML) 0.083% IN NEBU: 2.5000 mg | INHALATION_SOLUTION | RESPIRATORY_TRACT | Status: DC | PRN

## 2023-11-16 MED ORDER — SUGAMMADEX SODIUM 200 MG/2ML IV SOLN
INTRAVENOUS | Status: DC | PRN
  Administered 2023-11-16: 200 mg via INTRAVENOUS

## 2023-11-16 MED ORDER — ACETAMINOPHEN 10 MG/ML IV SOLN
INTRAVENOUS | Status: AC
  Filled 2023-11-16: qty 100

## 2023-11-16 MED ORDER — ONDANSETRON HCL 4 MG/2ML IJ SOLN
INTRAMUSCULAR | Status: AC
  Filled 2023-11-16: qty 2

## 2023-11-16 MED ORDER — MIDAZOLAM HCL 2 MG/2ML IJ SOLN
INTRAMUSCULAR | Status: AC
  Filled 2023-11-16: qty 2

## 2023-11-16 MED ORDER — FENTANYL CITRATE (PF) 100 MCG/2ML IJ SOLN
25.0000 ug | INTRAMUSCULAR | Status: DC | PRN
  Administered 2023-11-16 (×2): 50 ug via INTRAVENOUS

## 2023-11-16 MED ORDER — MIDAZOLAM HCL 2 MG/2ML IJ SOLN
INTRAMUSCULAR | Status: DC | PRN
  Administered 2023-11-16: 2 mg via INTRAVENOUS

## 2023-11-16 MED ORDER — BISACODYL 10 MG RE SUPP: 10.0000 mg | Freq: Every day | RECTAL | Status: DC | PRN

## 2023-11-16 MED ORDER — ONDANSETRON HCL 4 MG/2ML IJ SOLN: 4.0000 mg | Freq: Four times a day (QID) | INTRAMUSCULAR | Status: DC | PRN

## 2023-11-16 MED ORDER — SODIUM CHLORIDE 0.9 % IV SOLN: 250.0000 mL | INTRAVENOUS | Status: DC

## 2023-11-16 MED ORDER — INSULIN ASPART 100 UNIT/ML IJ SOLN
0.0000 [IU] | INTRAMUSCULAR | Status: DC
  Administered 2023-11-16: 11 [IU] via SUBCUTANEOUS

## 2023-11-16 MED ORDER — ONDANSETRON HCL 4 MG/2ML IJ SOLN
INTRAMUSCULAR | Status: DC | PRN
  Administered 2023-11-16: 4 mg via INTRAVENOUS

## 2023-11-16 MED ORDER — CEFAZOLIN SODIUM-DEXTROSE 2-4 GM/100ML-% IV SOLN
2.0000 g | INTRAVENOUS | Status: AC
  Administered 2023-11-16: 3 g via INTRAVENOUS
  Filled 2023-11-16: qty 100

## 2023-11-16 MED ORDER — CARVEDILOL 12.5 MG PO TABS
12.5000 mg | ORAL_TABLET | Freq: Two times a day (BID) | ORAL | Status: DC
  Administered 2023-11-17: 12.5 mg via ORAL
  Filled 2023-11-16: qty 1

## 2023-11-16 MED ORDER — INSULIN ASPART 100 UNIT/ML IJ SOLN
0.0000 [IU] | Freq: Three times a day (TID) | INTRAMUSCULAR | Status: DC
  Administered 2023-11-16 – 2023-11-17 (×2): 4 [IU] via SUBCUTANEOUS

## 2023-11-16 SURGICAL SUPPLY — 42 items
BAG COUNTER SPONGE SURGICOUNT (BAG) ×1 IMPLANT
BAND RUBBER #18 3X1/16 STRL (MISCELLANEOUS) ×2 IMPLANT
BENZOIN TINCTURE PRP APPL 2/3 (GAUZE/BANDAGES/DRESSINGS) ×1 IMPLANT
BLADE CLIPPER SURG (BLADE) IMPLANT
BUR MATCHSTICK NEURO 3.0 LAGG (BURR) ×1 IMPLANT
BUR PRECISION FLUTE 6.0 (BURR) ×1 IMPLANT
CANISTER SUCTION 3000ML PPV (SUCTIONS) ×1 IMPLANT
DRAPE LAPAROTOMY 100X72X124 (DRAPES) ×1 IMPLANT
DRAPE MICROSCOPE SLANT 54X150 (MISCELLANEOUS) ×1 IMPLANT
DRAPE SURG 17X23 STRL (DRAPES) ×4 IMPLANT
DRSG OPSITE POSTOP 4X6 (GAUZE/BANDAGES/DRESSINGS) ×1 IMPLANT
ELECTRODE BLDE 4.0 EZ CLN MEGD (MISCELLANEOUS) ×1 IMPLANT
ELECTRODE REM PT RTRN 9FT ADLT (ELECTROSURGICAL) ×1 IMPLANT
GAUZE 4X4 16PLY ~~LOC~~+RFID DBL (SPONGE) IMPLANT
GAUZE SPONGE 4X4 12PLY STRL (GAUZE/BANDAGES/DRESSINGS) ×1 IMPLANT
GLOVE BIO SURGEON STRL SZ 6 (GLOVE) ×1 IMPLANT
GLOVE BIO SURGEON STRL SZ8 (GLOVE) ×1 IMPLANT
GLOVE BIO SURGEON STRL SZ8.5 (GLOVE) ×1 IMPLANT
GLOVE BIOGEL PI IND STRL 6.5 (GLOVE) ×1 IMPLANT
GLOVE EXAM NITRILE XL STR (GLOVE) IMPLANT
GOWN STRL REUS W/ TWL LRG LVL3 (GOWN DISPOSABLE) IMPLANT
GOWN STRL REUS W/ TWL XL LVL3 (GOWN DISPOSABLE) ×1 IMPLANT
GOWN STRL REUS W/TWL 2XL LVL3 (GOWN DISPOSABLE) IMPLANT
HEMOSTAT POWDER KIT SURGIFOAM (HEMOSTASIS) ×1 IMPLANT
KIT BASIN OR (CUSTOM PROCEDURE TRAY) ×1 IMPLANT
KIT TURNOVER KIT B (KITS) ×1 IMPLANT
NDL HYPO 22X1.5 SAFETY MO (MISCELLANEOUS) ×1 IMPLANT
NEEDLE HYPO 22X1.5 SAFETY MO (MISCELLANEOUS) ×2 IMPLANT
NS IRRIG 1000ML POUR BTL (IV SOLUTION) ×1 IMPLANT
PACK LAMINECTOMY NEURO (CUSTOM PROCEDURE TRAY) ×1 IMPLANT
PAD ARMBOARD POSITIONER FOAM (MISCELLANEOUS) ×3 IMPLANT
PATTIES SURGICAL .5 X.5 (GAUZE/BANDAGES/DRESSINGS) ×1 IMPLANT
PATTIES SURGICAL .5 X1 (DISPOSABLE) IMPLANT
PATTIES SURGICAL 1X1 (DISPOSABLE) ×1 IMPLANT
SPONGE SURGIFOAM ABS GEL SZ50 (HEMOSTASIS) IMPLANT
STRIP CLOSURE SKIN 1/2X4 (GAUZE/BANDAGES/DRESSINGS) ×1 IMPLANT
SUT VIC AB 1 CT1 18XBRD ANBCTR (SUTURE) ×1 IMPLANT
SUT VIC AB 2-0 CP2 18 (SUTURE) ×1 IMPLANT
SYR 30ML SLIP (SYRINGE) ×1 IMPLANT
TOWEL GREEN STERILE (TOWEL DISPOSABLE) ×1 IMPLANT
TOWEL GREEN STERILE FF (TOWEL DISPOSABLE) ×1 IMPLANT
WATER STERILE IRR 1000ML POUR (IV SOLUTION) ×1 IMPLANT

## 2023-11-16 NOTE — Anesthesia Procedure Notes (Signed)
 Procedure Name: Intubation Date/Time: 11/16/2023 7:38 AM  Performed by: Marva Lonni PARAS, CRNAPre-anesthesia Checklist: Patient identified, Emergency Drugs available, Suction available and Patient being monitored Patient Re-evaluated:Patient Re-evaluated prior to induction Oxygen Delivery Method: Circle System Utilized Preoxygenation: Pre-oxygenation with 100% oxygen Induction Type: IV induction Ventilation: Mask ventilation without difficulty Laryngoscope Size: Mac and 3 Grade View: Grade II Tube type: Oral Tube size: 7.0 mm Number of attempts: 1 Airway Equipment and Method: Stylet Placement Confirmation: ETT inserted through vocal cords under direct vision, positive ETCO2 and breath sounds checked- equal and bilateral Secured at: 22 cm Tube secured with: Tape Dental Injury: Teeth and Oropharynx as per pre-operative assessment

## 2023-11-16 NOTE — Transfer of Care (Signed)
 Immediate Anesthesia Transfer of Care Note  Patient: Veronica Rivers  Procedure(s) Performed: LUMBAR LAMINECTOMY/DECOMPRESSION MICRODISCECTOMY RIGHT LUMBAR TWO-THREE (Right)  Patient Location: PACU  Anesthesia Type:General  Level of Consciousness: drowsy and patient cooperative  Airway & Oxygen Therapy: Patient Spontanous Breathing and Patient connected to nasal cannula oxygen  Post-op Assessment: Report given to RN and Post -op Vital signs reviewed and stable  Post vital signs: Reviewed and stable  Last Vitals:  Vitals Value Taken Time  BP 137/74 11/16/23 09:20  Temp    Pulse 69 11/16/23 09:23  Resp 14 11/16/23 09:23  SpO2 90 % 11/16/23 09:23  Vitals shown include unfiled device data.  Last Pain:  Vitals:   11/16/23 0606  TempSrc:   PainSc: 0-No pain         Complications: No notable events documented.

## 2023-11-16 NOTE — H&P (Signed)
 Subjective: The patient is a 58 year old white female with emphysema who has complained of right leg pain.  She failed medical management.  She was worked up with a lumbar MRI which demonstrated a herniated disc at L2-3 on the right.  I discussed the various treatment options with her.  She has decided proceed with surgery.  Past Medical History:  Diagnosis Date   Anxiety    Asthma    Bipolar depression (HCC)    Cellulitis    CHEST PAIN    Depression    DM    DYSPNEA    Emphysema of lung (HCC)    HYPERCHOLESTEROLEMIA    HYPERLIPIDEMIA    HYPOTHYROIDISM    Neuropathy    Sleep apnea    home oxygen   SUPRAVENTRICULAR TACHYCARDIA     Past Surgical History:  Procedure Laterality Date   CESAREAN SECTION     COLONOSCOPY N/A 10/24/2020   Procedure: COLONOSCOPY;  Surgeon: Saintclair Jasper, MD;  Location: [facility];  Service: Gastroenterology;  Laterality: N/A;   FOOT SURGERY Left    HEMOSTASIS CLIP PLACEMENT  10/24/2020   Procedure: HEMOSTASIS CLIP PLACEMENT;  Surgeon: Saintclair Jasper, MD;  Location: [facility];  Service: Gastroenterology;;   NASAL SEPTUM SURGERY     NECK SURGERY     TONSILLECTOMY     TUBAL LIGATION      Allergies  Allergen Reactions   Prilosec [Omeprazole ]     Made her breathing worse.   Flexeril [Cyclobenzaprine] Other (See Comments)    crazy in head/dissociation   Invokana [Canagliflozin] Other (See Comments)    yeast infections   Jardiance [Empagliflozin] Other (See Comments)    yeast infections   Mobic  [Meloxicam ] Other (See Comments)    Pt stated, Got Burning, Acid Reflux with medicine Pt should not be on any anti-inflammatories d/t elevated LFTs   Ozempic (0.25 Or 0.5 Mg-Dose) [Semaglutide(0.25 Or 0.5mg -Dos)] Nausea And Vomiting   Trulicity [Dulaglutide] Nausea And Vomiting   Valtrex  [Valacyclovir ] Other (See Comments)    agitation   Zetia  [Ezetimibe ] Other (See Comments)    Unsure of reaction   Zocor  [Simvastatin ] Other (See Comments)    Caused  muscle pain   Sulfa Antibiotics Rash   Sulfa Drugs Cross Reactors Rash    Social History   Tobacco Use   Smoking status: Former    Current packs/day: 0.00    Types: Cigarettes    Start date: 11/24/1982    Quit date: 11/23/1997    Years since quitting: 25.9   Smokeless tobacco: Never  Substance Use Topics   Alcohol use: No    Family History  Problem Relation Age of Onset   Hypertension Mother    COPD Mother        Died age 58   Heart disease Mother        had ICD, coronary vasospasm   Heart attack Mother        first MI at age 25   Cancer Father        Lung, died in his 31s   Prior to Admission medications   Medication Sig Start Date End Date Taking? Authorizing Provider  amphetamine -dextroamphetamine  (ADDERALL) 20 MG tablet Take 20 mg by mouth in the morning.   Yes [provider]  ARIPiprazole  (ABILIFY ) 15 MG tablet Take 15 mg by mouth at bedtime.   Yes [provider]  budesonide -formoterol  (SYMBICORT ) 160-4.5 MCG/ACT inhaler Inhale 2 puffs into the lungs 2 (two) times daily. 12/28/22  Yes Jude Harden GAILS,  MD  buPROPion  (WELLBUTRIN  XL) 150 MG 24 hr tablet Take 450 mg by mouth in the morning.   Yes [provider]  carvedilol  (COREG ) 12.5 MG tablet TAKE 1 TABLET BY MOUTH TWICE A DAY WITH A MEAL 05/16/23  Yes Delford Maude BROCKS, MD  Cholecalciferol (VITAMIN D3) 125 MCG (5000 UT) CAPS Take 15,000 Units by mouth in the morning. 02/07/20  Yes [provider]  DULoxetine  (CYMBALTA ) 30 MG capsule Take 90 mg by mouth at bedtime. 09/14/19  Yes [provider]  famotidine  (PEPCID ) 40 MG tablet Take 40 mg by mouth at bedtime.   Yes [provider]  furosemide  (LASIX ) 20 MG tablet Take 1 tablet (20 mg total) by mouth daily. 12/12/22  Yes Nishan, Peter C, MD  hydrocortisone cream 0.5 % Apply 1 application  topically 2 (two) times daily as needed (skin irritation.).   Yes [provider]  insulin  degludec (TRESIBA FLEXTOUCH) 100 UNIT/ML  FlexTouch Pen Inject 50 Units into the skin in the morning.   Yes [provider]  insulin  lispro (HUMALOG) 100 UNIT/ML injection Inject 40 Units into the skin in the morning.   Yes [provider]  lamoTRIgine  (LAMICTAL ) 150 MG tablet Take 300 mg by mouth at bedtime.   Yes [provider]  levothyroxine  (SYNTHROID ) 125 MCG tablet Take 125 mcg by mouth daily before breakfast.   Yes [provider]  methocarbamol  (ROBAXIN ) 500 MG tablet Take 500 mg by mouth at bedtime. 10/30/23  Yes [provider]  montelukast  (SINGULAIR ) 10 MG tablet TAKE 1 TABLET BY MOUTH AT BEDTIME 07/21/23  Yes Jude Harden GAILS, MD  nitroGLYCERIN  (NITROSTAT ) 0.4 MG SL tablet Place 1 tablet (0.4 mg total) under the tongue every 5 (five) minutes as needed for chest pain. 06/30/20 11/03/26 Yes Nishan, Peter C, MD  Omega-3 Fatty Acids (FISH OIL PO) Take 1 capsule by mouth every evening.   Yes [provider]  oxyCODONE -acetaminophen  (PERCOCET) 10-325 MG tablet Take 1 tablet by mouth 5 (five) times daily as needed for pain. 09/07/20  Yes [provider]  OXYGEN Inhale 2 L into the lungs continuous.   Yes [provider]  QUEtiapine  (SEROQUEL ) 400 MG tablet Take 400 mg by mouth at bedtime.   Yes [provider]  albuterol  (VENTOLIN  HFA) 108 (90 Base) MCG/ACT inhaler Inhale 1-2 puffs into the lungs every 4 (four) hours as needed for wheezing or shortness of breath. 01/04/22   Jude Harden GAILS, MD  ONE TOUCH ULTRA TEST test strip Inject 1 strip into the skin daily. 10/11/17   [provider]  PROMETHEGAN 12.5 MG suppository Place 12.5 mg rectally 2 (two) times daily as needed for nausea or vomiting. 05/07/19   [provider]  SYNVISC 16 MG/2ML SOSY Inject 16 mg into the articular space every 6 (six) months. 07/30/19   [provider]     Review of Systems  Positive ROS: As above  All other systems have been reviewed and were otherwise  negative with the exception of those mentioned in the HPI and as above.  Objective: Vital signs in last 24 hours: Temp:  [98 F (36.7 C)] 98 F (36.7 C) (07/10 0551) Pulse Rate:  [60] 60 (07/10 0551) Resp:  [18] 18 (07/10 0551) BP: (129)/(66) 129/66 (07/10 0551) SpO2:  [93 %] 93 % (07/10 0551) Weight:  [123.2 kg] 123.2 kg (07/10 0551) Estimated body mass index is 40.11 kg/m as calculated from the following:   Height as of this  encounter: 5' 9 (1.753 m).   Weight as of this encounter: 123.2 kg.   General Appearance: Alert Head: Normocephalic, without obvious abnormality, atraumatic Eyes: PERRL, conjunctiva/corneas clear, EOM's intact,    Ears: Normal  Throat: Normal  Neck: Supple, Back: unremarkable Lungs: Clear to auscultation bilaterally, respirations unlabored Heart: Regular rate and rhythm, no murmur, rub or gallop Abdomen: Soft, non-tender Extremities: Extremities normal, atraumatic, no cyanosis or edema Skin: unremarkable  NEUROLOGIC:   Mental status: alert and oriented,Motor Exam - grossly normal Sensory Exam - grossly normal Reflexes:  Coordination - grossly normal Gait - grossly normal Balance - grossly normal Cranial Nerves: I: smell Not tested  II: visual acuity  OS: Normal  OD: Normal   II: visual fields Full to confrontation  II: pupils Equal, round, reactive to light  III,VII: ptosis None  III,IV,VI: extraocular muscles  Full ROM  V: mastication Normal  V: facial light touch sensation  Normal  V,VII: corneal reflex  Present  VII: facial muscle function - upper  Normal  VII: facial muscle function - lower Normal  VIII: hearing Not tested  IX: soft palate elevation  Normal  IX,X: gag reflex Present  XI: trapezius strength  5/5  XI: sternocleidomastoid strength 5/5  XI: neck flexion strength  5/5  XII: tongue strength  Normal    Data Review Lab Results  Component Value Date   WBC 6.0 11/08/2023   HGB 13.9 11/08/2023   HCT 42.9 11/08/2023    MCV 94.5 11/08/2023   PLT 226 11/08/2023   Lab Results  Component Value Date   NA 139 11/08/2023   K 4.8 11/08/2023   CL 97 (L) 11/08/2023   CO2 34 (H) 11/08/2023   BUN 8 11/08/2023   CREATININE 0.83 11/08/2023   GLUCOSE 273 (H) 11/08/2023   No results found for: INR, PROTIME  Assessment/Plan: Right L2-3 herniated disc, lumbar radiculopathy, lumbago: I have discussed situation with the patient and her husband.  I have reviewed her MRI scan with him and pointed out the abnormalities.  We have discussed the various treatment options including surgery.  I have described the surgical treatment option of a right L2-3 discectomy.  I have shown her surgical models.  I have given her surgical pamphlet.  We have discussed the risk, benefits, alternatives, expected postop course, and likelihood of achieving her goals with surgery.  I have answered the patient's questions.  She has decided proceed with surgery.   Veronica Rivers Budge 11/16/2023 7:07 AM

## 2023-11-16 NOTE — Op Note (Signed)
 Brief history: The patient is a 58 year old white female with diabetes and emphysema who has complained of back and right anterior leg pain consistent with a lumbar radiculopathy.  She had failed medical management and was worked up with a lumbar MRI which demonstrated herniated disks at L2-3 on the right.  I discussed the various treatment options with her.  She has decided proceed with surgery.  Preoperative diagnosis: Right L2-3 herniated disc, lumbar radiculopathy, lumbago  Postoperative diagnosis: The same  Procedure: Right L2-3 intervertebral discectomy using micro-dissection  Surgeon: Dr. Chyrl Budge  Asst.: Duwaine Beck, NP  Anesthesia: Gen. endotracheal  Estimated blood loss: 50 cc  Drains: None  Complications: None  Description of procedure: The patient was brought to the operating room by the anesthesia team. General endotracheal anesthesia was induced. The patient was turned to the prone position on the Wilson frame. The patient's lumbosacral region was then prepared with Betadine scrub and Betadine solution. Sterile drapes were applied.  I then injected the area to be incised with Marcaine  with epinephrine  solution. I then used a scalpel to make a linear midline incision over the L2-3 intervertebral disc space. I then used electrocautery to perform a right sided subperiosteal dissection exposing the spinous process and lamina of L2-3 on the right. We obtained intraoperative radiograph to confirm our location. I then inserted the [facility] retractor for exposure.  We then brought the operative microscope into the field. Under its magnification and illumination we completed the microdissection. I used a high-speed drill to perform a laminotomy at L2-3 on the right. I then used a Kerrison punches to widen the laminotomy and removed the ligamentum flavum at L2-3 on the right. We then used microdissection to free up the thecal sac and the right L3 nerve root from the epidural  tissue. I then used a Kerrison punch to perform a foraminotomy at about the right L3 nerve root. We then using the nerve root retractor to gently retract the thecal sac and the right L3 nerve root medially. This exposed the intervertebral disc. We identified the ruptured disc and remove it with the pituitary forceps.  We performed a partial intervertebral discectomy.  I then palpated along the ventral surface of the thecal sac and along exit route of the right L2-3 nerve root and noted that the neural structures were well decompressed. This completed the decompression.  We then obtained hemostasis using bipolar electrocautery. We irrigated the wound out with saline solution. We then removed the retractor. We then reapproximated the patient's thoracolumbar fascia with interrupted #1 Vicryl suture. We then reapproximated the patient's subcutaneous tissue with interrupted 2-0 Vicryl suture. We then reapproximated patient's skin with Steri-Strips and benzoin. The was then coated with bacitracin  ointment. The drapes were removed. The patient was subsequently returned to the supine position where they were extubated by the anesthesia team. The patient was then transported to the postanesthesia care unit in stable condition. All sponge instrument and needle counts were reportedly correct at the end of this case.

## 2023-11-16 NOTE — Anesthesia Postprocedure Evaluation (Signed)
 Anesthesia Post Note  Patient: Veronica Rivers  Procedure(s) Performed: LUMBAR LAMINECTOMY/DECOMPRESSION MICRODISCECTOMY RIGHT LUMBAR TWO-THREE (Right)     Patient location during evaluation: PACU Anesthesia Type: General Level of consciousness: awake and alert Pain management: pain level controlled Vital Signs Assessment: post-procedure vital signs reviewed and stable Respiratory status: spontaneous breathing, nonlabored ventilation, respiratory function stable and patient connected to nasal cannula oxygen Cardiovascular status: blood pressure returned to baseline and stable Postop Assessment: no apparent nausea or vomiting Anesthetic complications: no   No notable events documented.  Last Vitals:  Vitals:   11/16/23 1049 11/16/23 1547  BP: 120/62 122/64  Pulse: 64 71  Resp: 20 18  Temp: (!) 36.4 C 36.6 C  SpO2: 100% 95%    Last Pain:  Vitals:   11/16/23 1547  TempSrc: Oral  PainSc:                  [name]

## 2023-11-17 ENCOUNTER — Other Ambulatory Visit (HOSPITAL_COMMUNITY): Payer: Self-pay

## 2023-11-17 ENCOUNTER — Encounter (HOSPITAL_COMMUNITY): Payer: Self-pay | Admitting: Neurosurgery

## 2023-11-17 DIAGNOSIS — M5116 Intervertebral disc disorders with radiculopathy, lumbar region: Secondary | ICD-10-CM | POA: Diagnosis not present

## 2023-11-17 LAB — GLUCOSE, CAPILLARY: Glucose-Capillary: 172 mg/dL — ABNORMAL HIGH (ref 70–99)

## 2023-11-17 MED ORDER — OXYCODONE-ACETAMINOPHEN 5-325 MG PO TABS
1.0000 | ORAL_TABLET | ORAL | 0 refills | Status: DC | PRN
  Filled 2023-11-17: qty 30, 5d supply, fill #0

## 2023-11-17 MED ORDER — DOCUSATE SODIUM 100 MG PO CAPS
100.0000 mg | ORAL_CAPSULE | Freq: Two times a day (BID) | ORAL | 0 refills | Status: AC
  Filled 2023-11-17: qty 30, 15d supply, fill #0

## 2023-11-17 NOTE — Plan of Care (Signed)
  Problem: Education: Goal: Knowledge of General Education information will improve Description: Including pain rating scale, medication(s)/side effects and non-pharmacologic comfort measures Outcome: Completed/Met   Problem: Health Behavior/Discharge Planning: Goal: Ability to manage health-related needs will improve Outcome: Completed/Met   Problem: Clinical Measurements: Goal: Ability to maintain clinical measurements within normal limits will improve Outcome: Completed/Met Goal: Will remain free from infection Outcome: Completed/Met Goal: Diagnostic test results will improve Outcome: Completed/Met Goal: Respiratory complications will improve Outcome: Completed/Met Goal: Cardiovascular complication will be avoided Outcome: Completed/Met   Problem: Activity: Goal: Risk for activity intolerance will decrease Outcome: Completed/Met   Problem: Nutrition: Goal: Adequate nutrition will be maintained Outcome: Completed/Met   Problem: Coping: Goal: Level of anxiety will decrease Outcome: Completed/Met   Problem: Elimination: Goal: Will not experience complications related to bowel motility Outcome: Completed/Met Goal: Will not experience complications related to urinary retention Outcome: Completed/Met   Problem: Pain Managment: Goal: General experience of comfort will improve and/or be controlled Outcome: Completed/Met   Problem: Safety: Goal: Ability to remain free from injury will improve Outcome: Completed/Met   Problem: Skin Integrity: Goal: Risk for impaired skin integrity will decrease Outcome: Completed/Met   Problem: Education: Goal: Ability to describe self-care measures that may prevent or decrease complications (Diabetes Survival Skills Education) will improve Outcome: Completed/Met Goal: Individualized Educational Video(s) Outcome: Completed/Met   Problem: Coping: Goal: Ability to adjust to condition or change in health will improve Outcome:  Completed/Met   Problem: Fluid Volume: Goal: Ability to maintain a balanced intake and output will improve Outcome: Completed/Met   Problem: Health Behavior/Discharge Planning: Goal: Ability to identify and utilize available resources and services will improve Outcome: Completed/Met Goal: Ability to manage health-related needs will improve Outcome: Completed/Met   Problem: Metabolic: Goal: Ability to maintain appropriate glucose levels will improve Outcome: Completed/Met   Problem: Nutritional: Goal: Maintenance of adequate nutrition will improve Outcome: Completed/Met Goal: Progress toward achieving an optimal weight will improve Outcome: Completed/Met   Problem: Skin Integrity: Goal: Risk for impaired skin integrity will decrease Outcome: Completed/Met   Problem: Tissue Perfusion: Goal: Adequacy of tissue perfusion will improve Outcome: Completed/Met   Problem: Education: Goal: Ability to verbalize activity precautions or restrictions will improve Outcome: Completed/Met Goal: Knowledge of the prescribed therapeutic regimen will improve Outcome: Completed/Met Goal: Understanding of discharge needs will improve Outcome: Completed/Met   Problem: Activity: Goal: Ability to avoid complications of mobility impairment will improve Outcome: Completed/Met Goal: Ability to tolerate increased activity will improve Outcome: Completed/Met Goal: Will remain free from falls Outcome: Completed/Met   Problem: Bowel/Gastric: Goal: Gastrointestinal status for postoperative course will improve Outcome: Completed/Met   Problem: Clinical Measurements: Goal: Ability to maintain clinical measurements within normal limits will improve Outcome: Completed/Met Goal: Postoperative complications will be avoided or minimized Outcome: Completed/Met Goal: Diagnostic test results will improve Outcome: Completed/Met   Problem: Pain Management: Goal: Pain level will decrease Outcome:  Completed/Met   Problem: Skin Integrity: Goal: Will show signs of wound healing Outcome: Completed/Met   Problem: Health Behavior/Discharge Planning: Goal: Identification of resources available to assist in meeting health care needs will improve Outcome: Completed/Met   Problem: Bladder/Genitourinary: Goal: Urinary functional status for postoperative course will improve Outcome: Completed/Met

## 2023-11-17 NOTE — Progress Notes (Signed)
 Patient alert and oriented, mae's well, voiding adequate amount of urine, swallowing without difficulty, no c/o pain at time of discharge. Patient discharged home with family. Script and discharged instructions given to patient. Patient and family stated understanding of instructions given. Room was checked and accounted for all patient's belongings; discharge instructions concerning her medications, incision care, follow up appointment and when to call the doctor as needed were all discussed with patient by RN and she expressed understanding on the instructions given.

## 2023-11-17 NOTE — Evaluation (Signed)
 Physical Therapy Evaluation  Patient Details Name: Veronica Rivers MRN: [id number] DOB: Feb 09, 1966 Today's Date: 11/17/2023  History of Present Illness  Pt is a 58 y/o female who presents s/p L2-L3 laminectomy/decompression on 11/16/2023. PMH significant for Bipolar depression, emphysema, hypothyroidism, neuropathy, sleep apnea, SVT, L foot surgery, neck surgery (unspecified).  Clinical Impression  Pt admitted with above diagnosis. At the time of PT eval, pt was able to demonstrate transfers and ambulation with gross CGA to supervision for safety and no AD. Pt was educated on precautions, ADL completion, appropriate activity progression, and car transfer. Pt currently with functional limitations due to the deficits listed below (see PT Problem List). Pt will benefit from skilled PT to increase their independence and safety with mobility to allow discharge to the venue listed below.          If plan is discharge home, recommend the following: A little help with bathing/dressing/bathroom;A little help with walking and/or transfers;Assistance with cooking/housework;Assist for transportation;Help with stairs or ramp for entrance   Can travel by private vehicle        Equipment Recommendations None recommended by PT  Recommendations for Other Services       Functional Status Assessment Patient has had a recent decline in their functional status and demonstrates the ability to make significant improvements in function in a reasonable and predictable amount of time.     Precautions / Restrictions Precautions Precautions: Fall;Back Precaution Booklet Issued: Yes (comment) Recall of Precautions/Restrictions: Intact Precaution/Restrictions Comments: Reviewed handout and pt was cued for precautions during functional mobility. Required Braces or Orthoses:  (No brace needed order) Restrictions Weight Bearing Restrictions Per Provider Order: No      Mobility  Bed Mobility Overal bed mobility:  Needs Assistance Bed Mobility: Rolling, Sidelying to Sit Rolling: Contact guard assist Sidelying to sit: Contact guard assist       General bed mobility comments: VC's for sequencing and log roll technique.    Transfers Overall transfer level: Needs assistance Equipment used: None Transfers: Sit to/from Stand Sit to Stand: Contact guard assist           General transfer comment: Close guard for safety as pt powered up to full stand    Ambulation/Gait Ambulation/Gait assistance: Supervision Gait Distance (Feet): 300 Feet Assistive device: None Gait Pattern/deviations: Step-through pattern, Decreased stride length, Trunk flexed Gait velocity: Decreased Gait velocity interpretation: 1.31 - 2.62 ft/sec, indicative of limited community ambulator   General Gait Details: Pt demonstrating improved posture and stability as distance progressed.  Stairs Stairs: Yes Stairs assistance: Contact guard assist Stair Management: One rail Left, Step to pattern, Forwards Number of Stairs: 10 General stair comments: VC's for sequencing and general safety.  Wheelchair Mobility     Tilt Bed    Modified Rankin (Stroke Patients Only)       Balance Overall balance assessment: Needs assistance Sitting-balance support: Feet supported, No upper extremity supported Sitting balance-Leahy Scale: Fair     Standing balance support: No upper extremity supported, During functional activity Standing balance-Leahy Scale: Fair                               Pertinent Vitals/Pain Pain Assessment Pain Assessment: Faces Faces Pain Scale: Hurts little more Pain Location: back Pain Descriptors / Indicators: Operative site guarding, Sore Pain Intervention(s): Limited activity within patient's tolerance, Monitored during session, Repositioned    Home Living Family/patient expects to be discharged to:: Private residence  Living Arrangements: Spouse/significant  other;Children Available Help at Discharge: Family;Available 24 hours/day Type of Home: House Home Access: Stairs to enter     Alternate Level Stairs-Number of Steps: 14 Home Layout: Two level Home Equipment: Tub bench;Hand held shower head;Grab bars - tub/shower      Prior Function Prior Level of Function : Independent/Modified Independent                     Extremity/Trunk Assessment   Upper Extremity Assessment Upper Extremity Assessment: Overall WFL for tasks assessed    Lower Extremity Assessment Lower Extremity Assessment: Generalized weakness (L with more weakness and decreased AROM compared to the R)    Cervical / Trunk Assessment Cervical / Trunk Assessment: Back Surgery  Communication   Communication Communication: No apparent difficulties    Cognition Arousal: Alert Behavior During Therapy: WFL for tasks assessed/performed   PT - Cognitive impairments: No apparent impairments                         Following commands: Intact       Cueing Cueing Techniques: Verbal cues, Gestural cues     General Comments      Exercises     Assessment/Plan    PT Assessment Patient needs continued PT services  PT Problem List Decreased strength;Decreased activity tolerance;Decreased balance;Decreased mobility;Decreased knowledge of use of DME;Decreased safety awareness;Decreased knowledge of precautions;Pain       PT Treatment Interventions DME instruction;Gait training;Stair training;Therapeutic activities;Functional mobility training;Therapeutic exercise;Balance training;Patient/family education    PT Goals (Current goals can be found in the Care Plan section)  Acute Rehab PT Goals Patient Stated Goal: Home today PT Goal Formulation: With patient/family Time For Goal Achievement: 11/24/23 Potential to Achieve Goals: Good    Frequency Min 5X/week     Co-evaluation               AM-PAC PT 6 Clicks Mobility  Outcome Measure  Help needed turning from your back to your side while in a flat bed without using bedrails?: A Little Help needed moving from lying on your back to sitting on the side of a flat bed without using bedrails?: A Little Help needed moving to and from a bed to a chair (including a wheelchair)?: A Little Help needed standing up from a chair using your arms (e.g., wheelchair or bedside chair)?: A Little Help needed to walk in hospital room?: A Little Help needed climbing 3-5 steps with a railing? : A Little 6 Click Score: 18    End of Session Equipment Utilized During Treatment: Gait belt Activity Tolerance: Patient tolerated treatment well Patient left: in bed;with call bell/phone within reach;with family/visitor present Nurse Communication: Mobility status PT Visit Diagnosis: Unsteadiness on feet (R26.81);Pain Pain - part of body:  (back)    Time: 9060-8993 PT Time Calculation (min) (ACUTE ONLY): 27 min   Charges:   PT Evaluation $PT Eval Low Complexity: 1 Low PT Treatments $Gait Training: 8-22 mins PT General Charges $$ ACUTE PT VISIT: 1 Visit         Leita Sable, PT, DPT Acute Rehabilitation Services Secure Chat Preferred Office: [phone]   Leita JONETTA Sable 11/17/2023, 11:02 AM

## 2023-11-17 NOTE — Discharge Summary (Signed)
 Physician Discharge Summary  Patient ID: Veronica Rivers MRN: [id number] DOB/AGE: 58-Mar-1967 58 y.o.  Admit date: 11/16/2023 Discharge date: 11/17/2023  Admission Diagnoses: Lumbar herniated disc, lumbar radiculopathy, lumbago  Discharge Diagnoses: The same Principal Problem:   Lumbar herniated disc   Discharged Condition: good  Hospital Course: I performed a right L2-3 discectomy on 11/16/2023.  The surgery went well.  The patient's postoperative course was unremarkable on postoperative day #1 she requested discharge to home.  The patient was given verbal and written discharge instructions.  All her questions were answered.  Consults: PT, care management Significant Diagnostic Studies: None Treatments: Right L2-3 discectomy using microdissection Discharge Exam: Blood pressure 116/68, pulse 68, temperature 97.8 F (36.6 C), temperature source Oral, resp. rate 20, height 5' 9 (1.753 m), weight 123.2 kg, SpO2 96%. The patient is alert and pleasant.  She looks well.  Her strength is normal.  Disposition: home  Discharge Instructions     Call MD for:  difficulty breathing, headache or visual disturbances   Complete by: As directed    Call MD for:  extreme fatigue   Complete by: As directed    Call MD for:  hives   Complete by: As directed    Call MD for:  persistant dizziness or light-headedness   Complete by: As directed    Call MD for:  persistant nausea and vomiting   Complete by: As directed    Call MD for:  redness, tenderness, or signs of infection (pain, swelling, redness, odor or green/yellow discharge around incision site)   Complete by: As directed    Call MD for:  severe uncontrolled pain   Complete by: As directed    Call MD for:  temperature >100.4   Complete by: As directed    Diet - low sodium heart healthy   Complete by: As directed    Discharge instructions   Complete by: As directed    Call [phone] for a followup appointment. Take a stool softener  while you are using pain medications.   Driving Restrictions   Complete by: As directed    Do not drive for 2 weeks.   Increase activity slowly   Complete by: As directed    Lifting restrictions   Complete by: As directed    Do not lift more than 5 pounds. No excessive bending or twisting.   May shower / Bathe   Complete by: As directed    Remove the dressing for 3 days after surgery.  You may shower, but leave the incision alone.   Remove dressing in 48 hours   Complete by: As directed       Allergies as of 11/17/2023       Reactions   Prilosec [omeprazole ]    Made her breathing worse.   Flexeril [cyclobenzaprine] Other (See Comments)   crazy in head/dissociation   Invokana [canagliflozin] Other (See Comments)   yeast infections   Jardiance [empagliflozin] Other (See Comments)   yeast infections   Mobic  [meloxicam ] Other (See Comments)   Pt stated, Got Burning, Acid Reflux with medicine Pt should not be on any anti-inflammatories d/t elevated LFTs   Ozempic (0.25 Or 0.5 Mg-dose) [semaglutide(0.25 Or 0.5mg -dos)] Nausea And Vomiting   Trulicity [dulaglutide] Nausea And Vomiting   Valtrex  [valacyclovir ] Other (See Comments)   agitation   Zetia  [ezetimibe ] Other (See Comments)   Unsure of reaction   Zocor  [simvastatin ] Other (See Comments)   Caused muscle pain   Sulfa Antibiotics Rash  Sulfa Drugs Cross Reactors Rash        Medication List     STOP taking these medications    oxyCODONE -acetaminophen  10-325 MG tablet Commonly known as: PERCOCET Replaced by: oxyCODONE -acetaminophen  5-325 MG tablet       TAKE these medications    albuterol  108 (90 Base) MCG/ACT inhaler Commonly known as: VENTOLIN  HFA Inhale 1-2 puffs into the lungs every 4 (four) hours as needed for wheezing or shortness of breath.   amphetamine -dextroamphetamine  20 MG tablet Commonly known as: ADDERALL Take 20 mg by mouth in the morning.   ARIPiprazole  15 MG tablet Commonly known as:  ABILIFY  Take 15 mg by mouth at bedtime.   budesonide -formoterol  160-4.5 MCG/ACT inhaler Commonly known as: Symbicort  Inhale 2 puffs into the lungs 2 (two) times daily.   buPROPion  150 MG 24 hr tablet Commonly known as: WELLBUTRIN  XL Take 450 mg by mouth in the morning.   carvedilol  12.5 MG tablet Commonly known as: COREG  TAKE 1 TABLET BY MOUTH TWICE A DAY WITH A MEAL   docusate sodium  100 MG capsule Commonly known as: COLACE Take 1 capsule (100 mg total) by mouth 2 (two) times daily.   DULoxetine  30 MG capsule Commonly known as: CYMBALTA  Take 90 mg by mouth at bedtime.   famotidine  40 MG tablet Commonly known as: PEPCID  Take 40 mg by mouth at bedtime.   FISH OIL PO Take 1 capsule by mouth every evening.   furosemide  20 MG tablet Commonly known as: LASIX  Take 1 tablet (20 mg total) by mouth daily.   hydrocortisone cream 0.5 % Apply 1 application  topically 2 (two) times daily as needed (skin irritation.).   insulin  lispro 100 UNIT/ML injection Commonly known as: HUMALOG Inject 40 Units into the skin in the morning.   lamoTRIgine  150 MG tablet Commonly known as: LAMICTAL  Take 300 mg by mouth at bedtime.   levothyroxine  125 MCG tablet Commonly known as: SYNTHROID  Take 125 mcg by mouth daily before breakfast.   methocarbamol  500 MG tablet Commonly known as: ROBAXIN  Take 500 mg by mouth at bedtime.   montelukast  10 MG tablet Commonly known as: SINGULAIR  TAKE 1 TABLET BY MOUTH AT BEDTIME   nitroGLYCERIN  0.4 MG SL tablet Commonly known as: NITROSTAT  Place 1 tablet (0.4 mg total) under the tongue every 5 (five) minutes as needed for chest pain.   ONE TOUCH ULTRA TEST test strip Generic drug: glucose blood Inject 1 strip into the skin daily.   oxyCODONE -acetaminophen  5-325 MG tablet Commonly known as: PERCOCET/ROXICET Take 1 tablet by mouth every 4 (four) hours as needed for moderate pain (pain score 4-6). Replaces: oxyCODONE -acetaminophen  10-325 MG tablet    OXYGEN Inhale 2 L into the lungs continuous.   Promethegan 12.5 MG suppository Generic drug: promethazine  Place 12.5 mg rectally 2 (two) times daily as needed for nausea or vomiting.   QUEtiapine  400 MG tablet Commonly known as: SEROQUEL  Take 400 mg by mouth at bedtime.   Synvisc 16 MG/2ML Sosy intra-articular injection Generic drug: hylan Inject 16 mg into the articular space every 6 (six) months.   Tresiba FlexTouch 100 UNIT/ML FlexTouch Pen Generic drug: insulin  degludec Inject 50 Units into the skin in the morning.   Vitamin D3 125 MCG (5000 UT) Caps Take 15,000 Units by mouth in the morning.         Signed: Reyes JONETTA Budge 11/17/2023, 6:56 AM

## 2023-12-03 ENCOUNTER — Other Ambulatory Visit: Payer: Self-pay | Admitting: Cardiovascular Disease

## 2024-01-04 ENCOUNTER — Other Ambulatory Visit: Payer: Self-pay | Admitting: Cardiovascular Disease

## 2024-01-15 ENCOUNTER — Other Ambulatory Visit: Payer: Self-pay | Admitting: Pulmonary Disease

## 2024-01-15 ENCOUNTER — Other Ambulatory Visit: Payer: Self-pay | Admitting: Cardiovascular Disease

## 2024-01-15 DIAGNOSIS — J4530 Mild persistent asthma, uncomplicated: Secondary | ICD-10-CM

## 2024-01-19 ENCOUNTER — Other Ambulatory Visit: Payer: Self-pay | Admitting: Pulmonary Disease

## 2024-01-19 DIAGNOSIS — J4530 Mild persistent asthma, uncomplicated: Secondary | ICD-10-CM

## 2024-01-28 ENCOUNTER — Other Ambulatory Visit: Payer: Self-pay | Admitting: Cardiovascular Disease

## 2024-02-08 ENCOUNTER — Other Ambulatory Visit: Payer: Self-pay | Admitting: Cardiovascular Disease

## 2024-02-25 ENCOUNTER — Other Ambulatory Visit: Payer: Self-pay | Admitting: Cardiovascular Disease

## 2024-03-04 ENCOUNTER — Other Ambulatory Visit: Payer: Self-pay | Admitting: Cardiovascular Disease

## 2024-04-02 ENCOUNTER — Emergency Department (HOSPITAL_COMMUNITY)

## 2024-04-02 ENCOUNTER — Inpatient Hospital Stay (HOSPITAL_COMMUNITY)
Admission: EM | Admit: 2024-04-02 | Discharge: 2024-04-04 | DRG: 291 | Disposition: A | Discharge status: Home / Self Care | Attending: Internal Medicine | Admitting: Internal Medicine

## 2024-04-02 ENCOUNTER — Encounter (HOSPITAL_COMMUNITY): Payer: Self-pay | Admitting: Internal Medicine

## 2024-04-02 ENCOUNTER — Ambulatory Visit: Payer: Self-pay | Admitting: Pulmonary Disease

## 2024-04-02 ENCOUNTER — Other Ambulatory Visit: Payer: Self-pay

## 2024-04-02 DIAGNOSIS — I4891 Unspecified atrial fibrillation: Secondary | ICD-10-CM | POA: Diagnosis not present

## 2024-04-02 DIAGNOSIS — E7800 Pure hypercholesterolemia, unspecified: Secondary | ICD-10-CM | POA: Diagnosis present

## 2024-04-02 DIAGNOSIS — Z7951 Long term (current) use of inhaled steroids: Secondary | ICD-10-CM

## 2024-04-02 DIAGNOSIS — R0609 Other forms of dyspnea: Secondary | ICD-10-CM | POA: Diagnosis not present

## 2024-04-02 DIAGNOSIS — Z7901 Long term (current) use of anticoagulants: Secondary | ICD-10-CM | POA: Diagnosis not present

## 2024-04-02 DIAGNOSIS — I509 Heart failure, unspecified: Secondary | ICD-10-CM | POA: Diagnosis not present

## 2024-04-02 DIAGNOSIS — Z886 Allergy status to analgesic agent status: Secondary | ICD-10-CM

## 2024-04-02 DIAGNOSIS — Z9981 Dependence on supplemental oxygen: Secondary | ICD-10-CM | POA: Diagnosis not present

## 2024-04-02 DIAGNOSIS — F909 Attention-deficit hyperactivity disorder, unspecified type: Secondary | ICD-10-CM | POA: Diagnosis present

## 2024-04-02 DIAGNOSIS — E039 Hypothyroidism, unspecified: Secondary | ICD-10-CM | POA: Diagnosis present

## 2024-04-02 DIAGNOSIS — I480 Paroxysmal atrial fibrillation: Secondary | ICD-10-CM | POA: Diagnosis present

## 2024-04-02 DIAGNOSIS — J9587 Transfusion-associated dyspnea (tad): Secondary | ICD-10-CM | POA: Diagnosis not present

## 2024-04-02 DIAGNOSIS — F319 Bipolar disorder, unspecified: Secondary | ICD-10-CM | POA: Diagnosis present

## 2024-04-02 DIAGNOSIS — Z825 Family history of asthma and other chronic lower respiratory diseases: Secondary | ICD-10-CM | POA: Diagnosis not present

## 2024-04-02 DIAGNOSIS — F3130 Bipolar disorder, current episode depressed, mild or moderate severity, unspecified: Secondary | ICD-10-CM | POA: Diagnosis present

## 2024-04-02 DIAGNOSIS — I10 Essential (primary) hypertension: Secondary | ICD-10-CM | POA: Diagnosis present

## 2024-04-02 DIAGNOSIS — Z79890 Hormone replacement therapy (postmenopausal): Secondary | ICD-10-CM | POA: Diagnosis not present

## 2024-04-02 DIAGNOSIS — I110 Hypertensive heart disease with heart failure: Principal | ICD-10-CM | POA: Diagnosis present

## 2024-04-02 DIAGNOSIS — J9621 Acute and chronic respiratory failure with hypoxia: Secondary | ICD-10-CM | POA: Diagnosis present

## 2024-04-02 DIAGNOSIS — Z87891 Personal history of nicotine dependence: Secondary | ICD-10-CM

## 2024-04-02 DIAGNOSIS — R0600 Dyspnea, unspecified: Secondary | ICD-10-CM | POA: Diagnosis present

## 2024-04-02 DIAGNOSIS — J432 Centrilobular emphysema: Secondary | ICD-10-CM | POA: Diagnosis present

## 2024-04-02 DIAGNOSIS — Z794 Long term (current) use of insulin: Secondary | ICD-10-CM | POA: Diagnosis not present

## 2024-04-02 DIAGNOSIS — I5033 Acute on chronic diastolic (congestive) heart failure: Secondary | ICD-10-CM | POA: Diagnosis present

## 2024-04-02 DIAGNOSIS — Z860100 Personal history of colon polyps, unspecified: Secondary | ICD-10-CM

## 2024-04-02 DIAGNOSIS — I471 Supraventricular tachycardia, unspecified: Secondary | ICD-10-CM | POA: Diagnosis present

## 2024-04-02 DIAGNOSIS — E119 Type 2 diabetes mellitus without complications: Secondary | ICD-10-CM | POA: Diagnosis present

## 2024-04-02 DIAGNOSIS — E1142 Type 2 diabetes mellitus with diabetic polyneuropathy: Secondary | ICD-10-CM | POA: Diagnosis not present

## 2024-04-02 DIAGNOSIS — J9611 Chronic respiratory failure with hypoxia: Secondary | ICD-10-CM | POA: Diagnosis present

## 2024-04-02 DIAGNOSIS — Z560 Unemployment, unspecified: Secondary | ICD-10-CM | POA: Diagnosis not present

## 2024-04-02 DIAGNOSIS — Z91048 Other nonmedicinal substance allergy status: Secondary | ICD-10-CM

## 2024-04-02 DIAGNOSIS — E8770 Fluid overload, unspecified: Principal | ICD-10-CM

## 2024-04-02 DIAGNOSIS — R0602 Shortness of breath: Secondary | ICD-10-CM | POA: Diagnosis present

## 2024-04-02 DIAGNOSIS — Z882 Allergy status to sulfonamides status: Secondary | ICD-10-CM | POA: Diagnosis not present

## 2024-04-02 DIAGNOSIS — Z6838 Body mass index (BMI) 38.0-38.9, adult: Secondary | ICD-10-CM | POA: Diagnosis not present

## 2024-04-02 DIAGNOSIS — Z79899 Other long term (current) drug therapy: Secondary | ICD-10-CM

## 2024-04-02 DIAGNOSIS — K449 Diaphragmatic hernia without obstruction or gangrene: Secondary | ICD-10-CM | POA: Diagnosis present

## 2024-04-02 DIAGNOSIS — Z8249 Family history of ischemic heart disease and other diseases of the circulatory system: Secondary | ICD-10-CM | POA: Diagnosis not present

## 2024-04-02 DIAGNOSIS — J4489 Other specified chronic obstructive pulmonary disease: Secondary | ICD-10-CM | POA: Diagnosis present

## 2024-04-02 DIAGNOSIS — E669 Obesity, unspecified: Secondary | ICD-10-CM | POA: Diagnosis present

## 2024-04-02 DIAGNOSIS — Z888 Allergy status to other drugs, medicaments and biological substances status: Secondary | ICD-10-CM

## 2024-04-02 LAB — RESP PANEL BY RT-PCR (RSV, FLU A&B, COVID)  RVPGX2
Influenza A by PCR: NEGATIVE
Influenza B by PCR: NEGATIVE
Resp Syncytial Virus by PCR: NEGATIVE
SARS Coronavirus 2 by RT PCR: NEGATIVE

## 2024-04-02 LAB — COMPREHENSIVE METABOLIC PANEL WITH GFR
ALT: 10 U/L (ref 0–44)
AST: 14 U/L — ABNORMAL LOW (ref 15–41)
Albumin: 4.3 g/dL (ref 3.5–5.0)
Alkaline Phosphatase: 92 U/L (ref 38–126)
Anion gap: 9 (ref 5–15)
BUN: 17 mg/dL (ref 6–20)
CO2: 30 mmol/L (ref 22–32)
Calcium: 9.7 mg/dL (ref 8.9–10.3)
Chloride: 101 mmol/L (ref 98–111)
Creatinine, Ser: 0.73 mg/dL (ref 0.44–1.00)
GFR, Estimated: >60 mL/min (ref >60)
Glucose, Bld: 230 mg/dL — ABNORMAL HIGH (ref 70–99)
Potassium: 4.3 mmol/L (ref 3.5–5.1)
Sodium: 139 mmol/L (ref 135–145)
Total Bilirubin: 0.3 mg/dL (ref 0.0–1.2)
Total Protein: 7.2 g/dL (ref 6.5–8.1)

## 2024-04-02 LAB — CBC
HCT: 44.4 % (ref 36.0–46.0)
Hemoglobin: 14.2 g/dL (ref 12.0–15.0)
MCH: 30.3 pg (ref 26.0–34.0)
MCHC: 32 g/dL (ref 30.0–36.0)
MCV: 94.9 fL (ref 80.0–100.0)
Platelets: 285 K/uL (ref 150–400)
RBC: 4.68 MIL/uL (ref 3.87–5.11)
RDW: 13 % (ref 11.5–15.5)
WBC: 9.1 K/uL (ref 4.0–10.5)
nRBC: 0 % (ref 0.0–0.2)

## 2024-04-02 LAB — PRO BRAIN NATRIURETIC PEPTIDE: Pro Brain Natriuretic Peptide: 1185 pg/mL — ABNORMAL HIGH (ref <300.0)

## 2024-04-02 LAB — HEMOGLOBIN A1C
Hgb A1c MFr Bld: 7.4 % — ABNORMAL HIGH (ref 4.8–5.6)
Mean Plasma Glucose: 165.68 mg/dL

## 2024-04-02 LAB — GLUCOSE, CAPILLARY
Glucose-Capillary: 177 mg/dL — ABNORMAL HIGH (ref 70–99)
Glucose-Capillary: 157 mg/dL — ABNORMAL HIGH (ref 70–99)

## 2024-04-02 LAB — HIV ANTIBODY (ROUTINE TESTING W REFLEX): HIV Screen 4th Generation wRfx: NONREACTIVE

## 2024-04-02 LAB — TROPONIN T, HIGH SENSITIVITY
Troponin T High Sensitivity: <15 ng/L (ref 0–19)
Troponin T High Sensitivity: <15 ng/L (ref 0–19)

## 2024-04-02 MED ORDER — ALBUTEROL SULFATE (2.5 MG/3ML) 0.083% IN NEBU: 3.0000 mL | INHALATION_SOLUTION | RESPIRATORY_TRACT | Status: DC | PRN

## 2024-04-02 MED ORDER — LEVOTHYROXINE SODIUM 25 MCG PO TABS
125.0000 ug | ORAL_TABLET | Freq: Every day | ORAL | Status: DC
  Administered 2024-04-03 – 2024-04-04 (×2): 125 ug via ORAL
  Filled 2024-04-02 (×2): qty 1

## 2024-04-02 MED ORDER — HYLAN G-F 20 16 MG/2ML IX SOSY: 16.0000 mg | PREFILLED_SYRINGE | INTRA_ARTICULAR | Status: DC

## 2024-04-02 MED ORDER — METHOCARBAMOL 500 MG PO TABS
500.0000 mg | ORAL_TABLET | Freq: Every day | ORAL | Status: DC
  Administered 2024-04-02 – 2024-04-03 (×2): 500 mg via ORAL
  Filled 2024-04-02 (×2): qty 1

## 2024-04-02 MED ORDER — DOCUSATE SODIUM 100 MG PO CAPS
100.0000 mg | ORAL_CAPSULE | Freq: Two times a day (BID) | ORAL | Status: DC
  Administered 2024-04-02 – 2024-04-04 (×4): 100 mg via ORAL
  Filled 2024-04-02 (×4): qty 1

## 2024-04-02 MED ORDER — ARIPIPRAZOLE 5 MG PO TABS: 15.0000 mg | ORAL_TABLET | Freq: Every day | ORAL | Status: DC

## 2024-04-02 MED ORDER — INSULIN GLARGINE-YFGN 100 UNIT/ML ~~LOC~~ SOLN
25.0000 [IU] | Freq: Two times a day (BID) | SUBCUTANEOUS | Status: DC
  Administered 2024-04-02 – 2024-04-04 (×4): 25 [IU] via SUBCUTANEOUS
  Filled 2024-04-02 (×7): qty 0.25

## 2024-04-02 MED ORDER — OXYCODONE-ACETAMINOPHEN 5-325 MG PO TABS
1.0000 | ORAL_TABLET | ORAL | Status: DC | PRN
  Administered 2024-04-02 – 2024-04-04 (×3): 1 via ORAL
  Filled 2024-04-02 (×3): qty 1

## 2024-04-02 MED ORDER — SUMATRIPTAN SUCCINATE 25 MG PO TABS
25.0000 mg | ORAL_TABLET | ORAL | Status: AC | PRN
  Administered 2024-04-02 – 2024-04-04 (×3): 25 mg via ORAL
  Filled 2024-04-02 (×5): qty 1

## 2024-04-02 MED ORDER — CARVEDILOL 12.5 MG PO TABS
12.5000 mg | ORAL_TABLET | Freq: Two times a day (BID) | ORAL | Status: DC
  Administered 2024-04-02 – 2024-04-04 (×4): 12.5 mg via ORAL
  Filled 2024-04-02 (×4): qty 1

## 2024-04-02 MED ORDER — INSULIN ASPART 100 UNIT/ML IJ SOLN: 0.0000 [IU] | Freq: Every day | INTRAMUSCULAR | Status: DC

## 2024-04-02 MED ORDER — FLUTICASONE FUROATE-VILANTEROL 100-25 MCG/ACT IN AEPB: 1.0000 | INHALATION_SPRAY | Freq: Every day | RESPIRATORY_TRACT | Status: DC

## 2024-04-02 MED ORDER — FAMOTIDINE 20 MG PO TABS
40.0000 mg | ORAL_TABLET | Freq: Every day | ORAL | Status: DC
  Administered 2024-04-02 – 2024-04-03 (×2): 40 mg via ORAL
  Filled 2024-04-02 (×2): qty 2

## 2024-04-02 MED ORDER — DULOXETINE HCL 60 MG PO CPEP
90.0000 mg | ORAL_CAPSULE | Freq: Every day | ORAL | Status: DC
  Administered 2024-04-02 – 2024-04-03 (×2): 90 mg via ORAL
  Filled 2024-04-02 (×2): qty 1

## 2024-04-02 MED ORDER — FUROSEMIDE 10 MG/ML IJ SOLN
20.0000 mg | Freq: Two times a day (BID) | INTRAMUSCULAR | Status: AC
  Administered 2024-04-02 – 2024-04-03 (×2): 20 mg via INTRAVENOUS
  Filled 2024-04-02 (×2): qty 2

## 2024-04-02 MED ORDER — SUMATRIPTAN SUCCINATE 25 MG PO TABS: 25.0000 mg | ORAL_TABLET | ORAL | Status: DC | PRN

## 2024-04-02 MED ORDER — LAMOTRIGINE 100 MG PO TABS
300.0000 mg | ORAL_TABLET | Freq: Every day | ORAL | Status: DC
  Administered 2024-04-02 – 2024-04-03 (×2): 300 mg via ORAL
  Filled 2024-04-02 (×2): qty 3

## 2024-04-02 MED ORDER — APIXABAN 5 MG PO TABS
5.0000 mg | ORAL_TABLET | Freq: Two times a day (BID) | ORAL | Status: DC
  Administered 2024-04-02 – 2024-04-04 (×4): 5 mg via ORAL
  Filled 2024-04-02 (×5): qty 1

## 2024-04-02 MED ORDER — AMPHETAMINE-DEXTROAMPHETAMINE 10 MG PO TABS
20.0000 mg | ORAL_TABLET | Freq: Two times a day (BID) | ORAL | Status: DC
  Administered 2024-04-03 – 2024-04-04 (×3): 20 mg via ORAL
  Filled 2024-04-02 (×3): qty 2

## 2024-04-02 MED ORDER — BUPROPION HCL ER (XL) 300 MG PO TB24
450.0000 mg | ORAL_TABLET | Freq: Every morning | ORAL | Status: DC
  Administered 2024-04-03 – 2024-04-04 (×2): 450 mg via ORAL
  Filled 2024-04-02 (×2): qty 1

## 2024-04-02 MED ORDER — QUETIAPINE FUMARATE 200 MG PO TABS: 400.0000 mg | ORAL_TABLET | Freq: Every day | ORAL | Status: DC

## 2024-04-02 MED ORDER — INSULIN ASPART 100 UNIT/ML IJ SOLN
3.0000 [IU] | Freq: Three times a day (TID) | INTRAMUSCULAR | Status: DC
  Administered 2024-04-03 – 2024-04-04 (×3): 3 [IU] via SUBCUTANEOUS
  Filled 2024-04-02 (×4): qty 3

## 2024-04-02 MED ORDER — PROMETHAZINE HCL 12.5 MG RE SUPP
12.5000 mg | Freq: Two times a day (BID) | RECTAL | Status: DC | PRN
Start: 2024-04-02 — End: 2024-04-04

## 2024-04-02 MED ORDER — AMPHETAMINE-DEXTROAMPHETAMINE 20 MG PO TABS: 20.0000 mg | ORAL_TABLET | Freq: Two times a day (BID) | ORAL | Status: DC

## 2024-04-02 MED ORDER — INSULIN ASPART 100 UNIT/ML IJ SOLN
0.0000 [IU] | Freq: Three times a day (TID) | INTRAMUSCULAR | Status: DC
  Administered 2024-04-02: 3 [IU] via SUBCUTANEOUS
  Administered 2024-04-03: 2 [IU] via SUBCUTANEOUS
  Administered 2024-04-03: 5 [IU] via SUBCUTANEOUS
  Administered 2024-04-03 – 2024-04-04 (×2): 3 [IU] via SUBCUTANEOUS
  Filled 2024-04-02 (×2): qty 3
  Filled 2024-04-02: qty 2
  Filled 2024-04-02: qty 5
  Filled 2024-04-02: qty 3

## 2024-04-02 MED ORDER — FUROSEMIDE 10 MG/ML IJ SOLN
60.0000 mg | Freq: Once | INTRAMUSCULAR | Status: AC
  Administered 2024-04-02: 60 mg via INTRAVENOUS
  Filled 2024-04-02: qty 8

## 2024-04-02 NOTE — Telephone Encounter (Signed)
 E2C2 Pulmonary Triage - Initial Assessment Questions "Chief Complaint (e.g., cough, sob, wheezing, fever, chills, sweat or additional symptoms) *Go to specific symptom protocol after initial questions. Cough, SOB  "How long have symptoms been present?" Few days  Have you tested for COVID or Flu? Note: If not, ask patient if a home test can be taken. If so, instruct patient to call back for positive results. No  MEDICINES:   "Have you used any OTC meds to help with symptoms?" No  OXYGEN: "Do you wear supplemental oxygen?" Yes only at noc prior to the last couple days If yes, "How many liters are you supposed to use?" 2  "Do you monitor your oxygen levels?" Yes If yes, What is your reading (oxygen level) today? 91 with O2  What is your usual oxygen saturation reading?  (Note: Pulmonary O2 sats should be 90% or greater) 95 without O2  FYI Only or Action Required?: FYI only for provider: ED advised.  Called Nurse Triage reporting Shortness of Breath.  Symptoms began several days ago.  Interventions attempted: Other: increased oxygen use.  Symptoms are: gradually worsening.  Triage Disposition: Go to ED Now (Notify PCP)  Patient/caregiver understands and will follow disposition?: yes  Copied from CRM #[id number]. Topic: Clinical - Red Word Triage >> Apr 02, 2024 10:57 AM [name] wrote: Red Word that prompted transfer to Nurse Triage: Pt sees Dr. Jude at [facility]  Pt stated last night she woke up unable to breathe with rattling in her chest. She tried to use her inhaler but she stated she had a hard time getting any air in her lungs. She stated she was coughing and coughed up watery liquid. Pt stated she's been having SOB for the last couple days. Pt also mentioned her o2 level dropped to 81 with oxygen on.  No appt is set for the pt. Reason for Disposition  Oxygen level (e.g., pulse oximetry) 90% or lower  Answer Assessment - Initial Assessment Questions 1.  RESPIRATORY STATUS: Describe your breathing? (e.g., wheezing, shortness of breath, unable to speak, severe coughing)      SOB 2. ONSET: When did this breathing problem begin?      Couple days 3. PATTERN Does the difficult breathing come and go, or has it been constant since it started?      intermittent 4. SEVERITY: How bad is your breathing? (e.g., mild, moderate, severe)      Describes breathing minimally abnormal at this time 5. RECURRENT SYMPTOM: Have you had difficulty breathing before? If Yes, ask: When was the last time? and What happened that time?      denies 6. CARDIAC HISTORY: Do you have any history of heart disease? (e.g., heart attack, angina, bypass surgery, angioplasty)      arrhythmia 7. LUNG HISTORY: Do you have any history of lung disease?  (e.g., pulmonary embolus, asthma, emphysema)     Restrictive pulmonary 8. CAUSE: What do you think is causing the breathing problem?      unsure 9. OTHER SYMPTOMS: Do you have any other symptoms? (e.g., chest pain, cough, dizziness, fever, runny nose)     Denies runny nose, cough, denies fever 10. O2 SATURATION MONITOR:  Do you use an oxygen saturation monitor (pulse oximeter) at home? If Yes, ask: What is your reading (oxygen level) today? What is your usual oxygen saturation reading? (e.g., 95%)       States was on 2 LPM, dropped to 81%, states she is currently 91%  Pt states that she  does not normally wear oxygen during the day and her SPO2 is 95-96% but is currently wearing O2 and is 91%. Pt states that she woke with a rattle in her chest last night while sleeping at this time was still utilizing O2 and had SPO2 at 81%. Pt states that she coughed up an excessive amount of water after having significant rattling in her chest.  Protocols used: Breathing Difficulty-A-AH

## 2024-04-02 NOTE — Progress Notes (Signed)
 PHARMACY - ANTICOAGULATION CONSULT NOTE  Pharmacy Consult for Apixaban  Indication: atrial fibrillation  Allergies  Allergen Reactions   Prilosec [Omeprazole ]     Made her breathing worse.   Flexeril [Cyclobenzaprine] Other (See Comments)    crazy in head/dissociation   Invokana [Canagliflozin] Other (See Comments)    yeast infections   Jardiance [Empagliflozin] Other (See Comments)    yeast infections   Mobic  [Meloxicam ] Other (See Comments)    Pt stated, Got Burning, Acid Reflux with medicine Pt should not be on any anti-inflammatories d/t elevated LFTs   Ozempic (0.25 Or 0.5 Mg-Dose) [Semaglutide(0.25 Or 0.5mg -Dos)] Nausea And Vomiting   Trulicity [Dulaglutide] Nausea And Vomiting   Valtrex  [Valacyclovir ] Other (See Comments)    agitation   Zetia  [Ezetimibe ] Other (See Comments)    Unsure of reaction   Zocor  [Simvastatin ] Other (See Comments)    Caused muscle pain   Sulfa Antibiotics Rash   Sulfa Drugs Cross Reactors Rash    Patient Measurements:    Vital Signs: Temp: 97.8 F (36.6 C) (11/25 1240) Temp Source: Oral (11/25 1240) BP: 159/96 (11/25 1240) Pulse Rate: 92 (11/25 1240)  Labs: Recent Labs    04/02/24 1258  HGB 14.2  HCT 44.4  PLT 285  CREATININE 0.73    CrCl cannot be calculated (Unknown ideal weight.).   Medical History: Past Medical History:  Diagnosis Date   Anxiety    Asthma    Bipolar depression (HCC)    Cellulitis    CHEST PAIN    Depression    DM    DYSPNEA    Emphysema of lung (HCC)    HYPERCHOLESTEROLEMIA    HYPERLIPIDEMIA    HYPOTHYROIDISM    Neuropathy    Sleep apnea    home oxygen   SUPRAVENTRICULAR TACHYCARDIA     Medications:  No PTA anticoagulation noted. F/u medication reconciliation report.   Assessment: 64 yoF presented with dypnea on exertion found to have new onset atrial fibrillation. Pertinent PMH of SVT. Pharmacy consulted for apixaban  dosing. Cardiology consulted with tentative plans for DCCV.  - Hgb  14.2, Pltc 285-- stable  - Scr 0.73 - at baseline   Goal of Therapy:  Monitor platelets by anticoagulation protocol: Yes   Plan:  Initiate Apixaban  5mg  PO BID Monitor for signs and symptoms of bleeding  Will provide education and conduct copay check prior to discharge.  Pharmacy will sign of at this time.   Jabier Deese 04/02/2024,3:19 PM

## 2024-04-02 NOTE — H&P (Signed)
 History and Physical  Veronica Rivers FMW:[id number] DOB: 1966-04-30 DOA: 04/02/2024  PCP: Marvene Prentice SAUNDERS, FNP Patient coming from: Home  I have personally briefly reviewed patient's old medical records in [facility]   Chief Complaint: Dyspnea on exertion and orthopnea  HPI: Veronica Rivers is a 58 y.o. female past medical history of supraventricular tachycardia COPD emphysematous/chronic respiratory failure with hypoxia on 2 L of oxygen, bipolar disorder with depression who comes in to the hospital for orthopnea and dyspnea on exertion that started 3 to 4 days prior to admission progressively getting worse.  She relates she is also been experiencing lower extremity swelling and what seems like a productive cough.  Denies any fever palpitations or chest pain.  In the ED: She was found to be borderline hypoxic on 2 L of oxygen, afebrile with no leukocytosis.  proBNP of 1100 respiratory panel is negative, my interpretation of the chest x-ray showed mild pulmonary edema   Review of Systems: All systems reviewed and apart from history of presenting illness, are negative.  Past Medical History:  Diagnosis Date   Anxiety    Asthma    Bipolar depression (HCC)    Cellulitis    CHEST PAIN    Depression    DM    DYSPNEA    Emphysema of lung (HCC)    HYPERCHOLESTEROLEMIA    HYPERLIPIDEMIA    HYPOTHYROIDISM    Neuropathy    Sleep apnea    home oxygen   SUPRAVENTRICULAR TACHYCARDIA    Past Surgical History:  Procedure Laterality Date   CESAREAN SECTION     COLONOSCOPY N/A 10/24/2020   Procedure: COLONOSCOPY;  Surgeon: Saintclair Jasper, MD;  Location: [facility]OS[facility];  Service: Gastroenterology;  Laterality: N/A;   FOOT SURGERY Left    HEMOSTASIS CLIP PLACEMENT  10/24/2020   Procedure: HEMOSTASIS CLIP PLACEMENT;  Surgeon: Saintclair Jasper, MD;  Location: [facility];  Service: Gastroenterology;;   LUMBAR LAMINECTOMY/DECOMPRESSION MICRODISCECTOMY Right 11/16/2023   Procedure: LUMBAR  LAMINECTOMY/DECOMPRESSION MICRODISCECTOMY RIGHT LUMBAR TWO-THREE;  Surgeon: Mavis Purchase, MD;  Location: [facility];  Service: Neurosurgery;  Laterality: Right;   NASAL SEPTUM SURGERY     NECK SURGERY     TONSILLECTOMY     TUBAL LIGATION     Social History:  reports that she quit smoking about 26 years ago. Her smoking use included cigarettes. She started smoking about 41 years ago. She has never used smokeless tobacco. She reports current drug use. Drug: Marijuana. She reports that she does not drink alcohol.   Allergies  Allergen Reactions   Prilosec [Omeprazole ]     Made her breathing worse.   Flexeril [Cyclobenzaprine] Other (See Comments)    crazy in head/dissociation   Invokana [Canagliflozin] Other (See Comments)    yeast infections   Jardiance [Empagliflozin] Other (See Comments)    yeast infections   Mobic  [Meloxicam ] Other (See Comments)    Pt stated, Got Burning, Acid Reflux with medicine Pt should not be on any anti-inflammatories d/t elevated LFTs   Ozempic (0.25 Or 0.5 Mg-Dose) [Semaglutide(0.25 Or 0.5mg -Dos)] Nausea And Vomiting   Trulicity [Dulaglutide] Nausea And Vomiting   Valtrex  [Valacyclovir ] Other (See Comments)    agitation   Zetia  [Ezetimibe ] Other (See Comments)    Unsure of reaction   Zocor  [Simvastatin ] Other (See Comments)    Caused muscle pain   Sulfa Antibiotics Rash   Sulfa Drugs Cross Reactors Rash    Family History  Problem Relation Age of Onset  Hypertension Mother    COPD Mother        Died age 37   Heart disease Mother        had ICD, coronary vasospasm   Heart attack Mother        first MI at age 51   Cancer Father        Lung, died in his 34s    Prior to Admission medications   Medication Sig Start Date End Date Taking? Authorizing Provider  albuterol  (VENTOLIN  HFA) 108 (90 Base) MCG/ACT inhaler INHALE ONE TO TWO PUFFS BY MOUTH EVERY 4 HOURS AS NEEDED FOR FOR SHORTNESS OF BREATH OR WHEEZING 01/19/24   Jude Harden GAILS, MD   amphetamine -dextroamphetamine  (ADDERALL) 20 MG tablet Take 20 mg by mouth in the morning.    [provider]  ARIPiprazole  (ABILIFY ) 15 MG tablet Take 15 mg by mouth at bedtime.    [provider]  budesonide -formoterol  (SYMBICORT ) 160-4.5 MCG/ACT inhaler Inhale 2 puffs into the lungs 2 (two) times daily. 12/28/22   Jude Harden GAILS, MD  buPROPion  (WELLBUTRIN  XL) 150 MG 24 hr tablet Take 450 mg by mouth in the morning.    [provider]  carvedilol  (COREG ) 12.5 MG tablet TAKE 1 TABLET BY MOUTH 2 TIMES A DAY WITH A MEAL 03/05/24   Delford Maude BROCKS, MD  Cholecalciferol (VITAMIN D3) 125 MCG (5000 UT) CAPS Take 15,000 Units by mouth in the morning. 02/07/20   [provider]  docusate sodium  (COLACE) 100 MG capsule Take 1 capsule (100 mg total) by mouth 2 (two) times daily. 11/17/23   Mavis Purchase, MD  DULoxetine  (CYMBALTA ) 30 MG capsule Take 90 mg by mouth at bedtime. 09/14/19   [provider]  famotidine  (PEPCID ) 40 MG tablet Take 40 mg by mouth at bedtime.    [provider]  furosemide  (LASIX ) 20 MG tablet TAKE 1 TABLET BY MOUTH DAILY 01/17/24   Nishan, Peter C, MD  hydrocortisone cream 0.5 % Apply 1 application  topically 2 (two) times daily as needed (skin irritation.).    [provider]  insulin  degludec (TRESIBA FLEXTOUCH) 100 UNIT/ML FlexTouch Pen Inject 50 Units into the skin in the morning.    [provider]  insulin  lispro (HUMALOG) 100 UNIT/ML injection Inject 40 Units into the skin in the morning.    [provider]  lamoTRIgine  (LAMICTAL ) 150 MG tablet Take 300 mg by mouth at bedtime.    [provider]  levothyroxine  (SYNTHROID ) 125 MCG tablet Take 125 mcg by mouth daily before breakfast.    [provider]  methocarbamol  (ROBAXIN ) 500 MG tablet Take 500 mg by mouth at bedtime. 10/30/23   [provider]  montelukast  (SINGULAIR ) 10 MG tablet TAKE 1 TABLET BY MOUTH AT BEDTIME 01/15/24    Jude Harden GAILS, MD  nitroGLYCERIN  (NITROSTAT ) 0.4 MG SL tablet Place 1 tablet (0.4 mg total) under the tongue every 5 (five) minutes as needed for chest pain. 06/30/20 11/03/26  Nishan, Peter C, MD  Omega-3 Fatty Acids (FISH OIL PO) Take 1 capsule by mouth every evening.    [provider]  ONE TOUCH ULTRA TEST test strip Inject 1 strip into the skin daily. 10/11/17   [provider]  oxyCODONE -acetaminophen  (PERCOCET/ROXICET) 5-325 MG tablet Take 1 tablet by mouth every 4 (four) hours as needed for moderate pain (pain score 4-6). 11/17/23   Mavis Purchase, MD  OXYGEN Inhale 2 L into the lungs continuous.    [provider]  PROMETHEGAN 12.5 MG suppository Place 12.5 mg rectally 2 (two) times daily as needed for nausea or vomiting. 05/07/19   [provider]  QUEtiapine  (SEROQUEL ) 400 MG tablet Take 400 mg by mouth at bedtime.    [provider]  SYNVISC 16 MG/2ML SOSY Inject 16 mg into the articular space every 6 (six) months. 07/30/19   [provider]   Physical Exam: Vitals:   04/02/24 1240  BP: (!) 159/96  Pulse: 92  Resp: (!) 22  Temp: 97.8 F (36.6 C)  TempSrc: Oral  SpO2: 90%    General exam: Moderately built and nourished patient, lying comfortably supine on the gurney in no obvious distress. Head, eyes and ENT: Nontraumatic and normocephalic.  Neck: Supple. No JVD, carotid bruit or thyromegaly. Lymphatics: No lymphadenopathy. Respiratory system: Clear to auscultation. No increased work of breathing. Cardiovascular system: Irregular rhythm, rate seems to be about 80 positive S1-S2 no murmurs rubs gallops appreciated. Gastrointestinal system: Abdomen is nondistended, soft and nontender. Normal bowel sounds heard. No organomegaly or masses appreciated. Central nervous system: Alert and oriented. No focal neurological deficits. Extremities: Trace lower extremity edema. Skin: No rashes or acute findings. Musculoskeletal system:  Negative exam. Psychiatry: Pleasant and cooperative.   Labs on Admission:  Basic Metabolic Panel: Recent Labs  Lab 04/02/24 1258  NA 139  K 4.3  CL 101  CO2 30  GLUCOSE 230*  BUN 17  CREATININE 0.73  CALCIUM  9.7   Liver Function Tests: Recent Labs  Lab 04/02/24 1258  AST 14*  ALT 10  ALKPHOS 92  BILITOT 0.3  PROT 7.2  ALBUMIN 4.3   No results for input(s): LIPASE, AMYLASE in the last 168 hours. No results for input(s): AMMONIA in the last 168 hours. CBC: Recent Labs  Lab 04/02/24 1258  WBC 9.1  HGB 14.2  HCT 44.4  MCV 94.9  PLT 285   Cardiac Enzymes: No results for input(s): CKTOTAL, CKMB, CKMBINDEX, TROPONINI in the last 168 hours.  BNP (last 3 results) Recent Labs    04/02/24 1258  PROBNP 1,185.0*   CBG: No results for input(s): GLUCAP in the last 168 hours.  Radiological Exams on Admission: No results found.  EKG: Independently reviewed.  Rate controlled A-fib normal axis nonspecific T wave changes.  Assessment/Plan Dyspnea likely due to rate controlled atrial fibrillation superimposed on chronic respiratory failure with hypoxia 2 L oxygen dependent/with a history ofSupraventricular tachycardia: Send respiratory panel. Twelve-lead EKG does show rate controlled new onset atrial fibrillation (the patient relates she is never been told she has atrial fibrillation) she has a history of SVT. Continue Coreg . Will consult cardiology, for possible TEE/DCCV admitted to telemetry Started on Eliquis .   Place her n.p.o. after midnight in case cardiology needs to DCCV cardioverter on 04/03/2024. Check a 2D echo, will start her on low-dose IV Lasix  twice a day.  She had a previous 2D echo last year that showed preserved EF. Strict I's and O's and daily weights.  Type 2 diabetes mellitus (HCC) Started on long-acting insulin  divider insulin  and a half in place with twice daily. Continue sliding scale insulin . Check an A1c.  Essential  hypertension Blood pressure is elevated resume Coreg  she was started on IV Lasix .  Bipolar depression (HCC) Resume all her home medication regimen. Seroquel , Abilify , Wellbutrin , Cymbalta  and Lamictal   ADHD: Resume Adderall.  Centrilobular emphysema (HCC) She is not wheezing on physical exam moving good air unlikely to be a COPD exacerbation. Continue home dose of inhalers. Cont on  2 L of oxygen.    DVT Prophylaxis: Eliquis  Code Status: Full  Family Communication: none  Disposition Plan: inpatient   Time spent: 75 min    It is my clinical opinion that admission to INPATIENT is reasonable and necessary in this 58 y.o. female dyspnea on exertion with orthopnea crackles on physical exam lower extremity edema chest x-ray showed mild pulmonary congestion with elevated BNP.  Given the aforementioned, the predictability of an adverse outcome is felt to be significant. I expect that the patient will require at least 2 midnights in the hospital to treat this condition.  Erle Odell Castor MD [facility]   04/02/2024, 2:52 PM  '

## 2024-04-02 NOTE — Discharge Instructions (Signed)

## 2024-04-02 NOTE — Telephone Encounter (Signed)
 Pt sent to ED by triage

## 2024-04-02 NOTE — ED Triage Notes (Signed)
 PT ambulatory to triage with complaints of shortness of breath and coughing up fluid beginning yesterday. PT wears 2L of Oxygen as needed, and reports that her baseline saturation is 95%, but has been 90% with oxygen. Pt denies swelling in extremities.

## 2024-04-02 NOTE — ED Provider Notes (Signed)
 [place] EMERGENCY DEPARTMENT AT [facility] Provider Note   CSN: [id number] Arrival date & time: 04/02/24  1227     Patient presents with: Shortness of Breath   Veronica Rivers is a 58 y.o. female.   Patient here worsening shortness of breath over the last day or 2.  Feels like she has some fluid in the lungs.  She has been coughing up some fluid.  She normally wears 2 L of oxygen but noticing that the oxygen has been in the low 90s.  Has not noticed any major swelling in the legs.  Does use Lasix  as needed.  She has a history of chronic lung disease/emphysema.  But denies any heart failure or CAD history.  She denies any fever chills.  Denies any atrial fibrillation history.  But does take Coreg  for some sort of heart rhythm issues she states.  She does have diabetes history as well.  She denies any recent surgery or travel.  No blood clot history.  The history is provided by the patient.       Prior to Admission medications   Medication Sig Start Date End Date Taking? Authorizing Provider  albuterol  (VENTOLIN  HFA) 108 (90 Base) MCG/ACT inhaler INHALE ONE TO TWO PUFFS BY MOUTH EVERY 4 HOURS AS NEEDED FOR FOR SHORTNESS OF BREATH OR WHEEZING 01/19/24   Jude Harden GAILS, MD  amphetamine -dextroamphetamine  (ADDERALL) 20 MG tablet Take 20 mg by mouth in the morning.    [provider]  ARIPiprazole  (ABILIFY ) 15 MG tablet Take 15 mg by mouth at bedtime.    [provider]  budesonide -formoterol  (SYMBICORT ) 160-4.5 MCG/ACT inhaler Inhale 2 puffs into the lungs 2 (two) times daily. 12/28/22   Jude Harden GAILS, MD  buPROPion  (WELLBUTRIN  XL) 150 MG 24 hr tablet Take 450 mg by mouth in the morning.    [provider]  carvedilol  (COREG ) 12.5 MG tablet TAKE 1 TABLET BY MOUTH 2 TIMES A DAY WITH A MEAL 03/05/24   Delford Maude BROCKS, MD  Cholecalciferol (VITAMIN D3) 125 MCG (5000 UT) CAPS Take 15,000 Units by mouth in the morning. 02/07/20   [provider]   docusate sodium  (COLACE) 100 MG capsule Take 1 capsule (100 mg total) by mouth 2 (two) times daily. 11/17/23   Mavis Purchase, MD  DULoxetine  (CYMBALTA ) 30 MG capsule Take 90 mg by mouth at bedtime. 09/14/19   [provider]  famotidine  (PEPCID ) 40 MG tablet Take 40 mg by mouth at bedtime.    [provider]  furosemide  (LASIX ) 20 MG tablet TAKE 1 TABLET BY MOUTH DAILY 01/17/24   Nishan, Peter C, MD  hydrocortisone cream 0.5 % Apply 1 application  topically 2 (two) times daily as needed (skin irritation.).    [provider]  insulin  degludec (TRESIBA FLEXTOUCH) 100 UNIT/ML FlexTouch Pen Inject 50 Units into the skin in the morning.    [provider]  insulin  lispro (HUMALOG) 100 UNIT/ML injection Inject 40 Units into the skin in the morning.    [provider]  lamoTRIgine  (LAMICTAL ) 150 MG tablet Take 300 mg by mouth at bedtime.    [provider]  levothyroxine  (SYNTHROID ) 125 MCG tablet Take 125 mcg by mouth daily before breakfast.    [provider]  methocarbamol  (ROBAXIN ) 500 MG tablet Take 500 mg by mouth at bedtime. 10/30/23   [provider]  montelukast  (SINGULAIR ) 10 MG tablet TAKE 1 TABLET BY MOUTH AT BEDTIME 01/15/24   Jude Harden GAILS,  MD  nitroGLYCERIN  (NITROSTAT ) 0.4 MG SL tablet Place 1 tablet (0.4 mg total) under the tongue every 5 (five) minutes as needed for chest pain. 06/30/20 11/03/26  Nishan, Peter C, MD  Omega-3 Fatty Acids (FISH OIL PO) Take 1 capsule by mouth every evening.    [provider]  ONE TOUCH ULTRA TEST test strip Inject 1 strip into the skin daily. 10/11/17   [provider]  oxyCODONE -acetaminophen  (PERCOCET/ROXICET) 5-325 MG tablet Take 1 tablet by mouth every 4 (four) hours as needed for moderate pain (pain score 4-6). 11/17/23   Mavis Purchase, MD  OXYGEN Inhale 2 L into the lungs continuous.    [provider]  PROMETHEGAN 12.5 MG suppository Place 12.5 mg rectally  2 (two) times daily as needed for nausea or vomiting. 05/07/19   [provider]  QUEtiapine  (SEROQUEL ) 400 MG tablet Take 400 mg by mouth at bedtime.    [provider]  SYNVISC 16 MG/2ML SOSY Inject 16 mg into the articular space every 6 (six) months. 07/30/19   [provider]    Allergies: Prilosec [omeprazole ], Flexeril [cyclobenzaprine], Invokana [canagliflozin], Jardiance [empagliflozin], Mobic  [meloxicam ], Ozempic (0.25 or 0.5 mg-dose) [semaglutide(0.25 or 0.5mg -dos)], Trulicity [dulaglutide], Valtrex  [valacyclovir ], Zetia  [ezetimibe ], Zocor  [simvastatin ], Sulfa antibiotics, and Sulfa drugs cross reactors    Review of Systems  Updated Vital Signs BP (!) 159/96 (BP Location: Right Arm)   Pulse 92   Temp 97.8 F (36.6 C) (Oral)   Resp (!) 22   LMP  (LMP Unknown) Comment: post-meno  SpO2 90%   Physical Exam Vitals and nursing note reviewed.  Constitutional:      General: She is not in acute distress.    Appearance: She is well-developed.  HENT:     Head: Normocephalic and atraumatic.  Eyes:     Conjunctiva/sclera: Conjunctivae normal.  Cardiovascular:     Rate and Rhythm: Normal rate. Rhythm irregular.     Heart sounds: No murmur heard. Pulmonary:     Effort: Pulmonary effort is normal. No respiratory distress.     Breath sounds: Decreased breath sounds and rales present.  Abdominal:     Palpations: Abdomen is soft.     Tenderness: There is no abdominal tenderness.  Musculoskeletal:        General: No swelling.     Cervical back: Neck supple.     Right lower leg: Edema present.     Left lower leg: Edema present.     Comments: Pitting edema in her legs bilaterally  Skin:    General: Skin is warm and dry.     Capillary Refill: Capillary refill takes less than 2 seconds.  Neurological:     Mental Status: She is alert.  Psychiatric:        Mood and Affect: Mood normal.     (all labs ordered are listed, but only abnormal results are  displayed) Labs Reviewed  PRO BRAIN NATRIURETIC PEPTIDE - Abnormal; Notable for the following components:      Result Value   Pro Brain Natriuretic Peptide 1,185.0 (*)    All other components within normal limits  COMPREHENSIVE METABOLIC PANEL WITH GFR - Abnormal; Notable for the following components:   Glucose, Bld 230 (*)    AST 14 (*)    All other components within normal limits  RESP PANEL BY RT-PCR (RSV, FLU A&B, COVID)  RVPGX2  CBC  TROPONIN T, HIGH SENSITIVITY    EKG: EKG Interpretation Date/Time:  Tuesday April 02 2024 12:59:45 EST  Ventricular Rate:  82 PR Interval:    QRS Duration:  95 QT Interval:  384 QTC Calculation: 449 R Axis:   68  Text Interpretation: Atrial fibrillation Confirmed by Ruthe Cornet [phone]) on 04/02/2024 1:03:45 PM  Radiology: No results found.   Procedures   Medications Ordered in the ED  furosemide  (LASIX ) injection 60 mg (60 mg Intravenous Given 04/02/24 1353)            CHA2DS2-VASc Score: 3                        Medical Decision Making Amount and/or Complexity of Data Reviewed Labs: ordered. Radiology: ordered.  Risk Prescription drug management. Decision regarding hospitalization.   SHAKEIA KRUS is here for shortness of breath last few days.  Chronically wears 2 L of oxygen for chronic lung issues.  Does take Lasix  at times but not for heart failure.  Maybe a little bit leg swelling.  She is 100% palpitations.  EKG here shows rate controlled A-fib.  Does not appear that she has a history of A-fib but she does take Coreg .  She not sure why she takes that medicine.  Overall looks like she has new rate controlled A-fib.  Looks like she has got some rales on exam some leg swelling.  I do wonder if this is some volume overload/may be new heart failure.  Is not having any active chest pain.  She has had shortness of breath with exertion.  I do not hear any major wheezing.  She is noticing that her oxygen levels are little bit  lower than normal.  Differential diagnosis volume overload from heart failure, new A-fib, less likely ACS.  I have low suspicion for PE at this time given that she looks like she is volume overloaded.  Will give breathing treatment to see if that helps.  Could be reactive airway process.  Could be viral process.  Seems less likely to be infectious process.  Will check CBC BMP troponin BNP chest x-ray COVID flu RSV test.  Her Chad Vascor is 3 she will need to be started on anticoagulation.  She does not have any obvious contraindications to this.  But she is rate controlled at this time.  Overall lab work significant for proBNP of 1200.  Troponins normal.  There is no significant anemia or electrolyte abnormality or kidney injury otherwise.  Chest x-ray shows a little bit of edema per my review and interpretation but no major effusion or pneumonia.  I do think clinically she is volume overloaded.  She does not really have a definitive history of heart failure or valvular heart disease.  I do think she would benefit from admission for IV diuresis echocardiogram.  Given new A-fib as well will likely need to be started on anticoagulation.  Will talk with hospitalist about admission for further workup.    This chart was dictated using voice recognition software.  Despite best efforts to proofread,  errors can occur which can change the documentation meaning.      Final diagnoses:  Hypervolemia, unspecified hypervolemia type  SOB (shortness of breath)  Congestive heart failure, unspecified HF chronicity, unspecified heart failure type [facility])  Atrial fibrillation, unspecified type [facility]    ED Discharge Orders     None          Ruthe Cornet, DO 04/02/24 1418

## 2024-04-03 ENCOUNTER — Telehealth (HOSPITAL_COMMUNITY): Payer: Self-pay

## 2024-04-03 ENCOUNTER — Inpatient Hospital Stay (HOSPITAL_COMMUNITY)

## 2024-04-03 ENCOUNTER — Other Ambulatory Visit (HOSPITAL_COMMUNITY): Payer: Self-pay

## 2024-04-03 DIAGNOSIS — I10 Essential (primary) hypertension: Secondary | ICD-10-CM

## 2024-04-03 DIAGNOSIS — I509 Heart failure, unspecified: Secondary | ICD-10-CM

## 2024-04-03 DIAGNOSIS — F319 Bipolar disorder, unspecified: Secondary | ICD-10-CM

## 2024-04-03 DIAGNOSIS — J9611 Chronic respiratory failure with hypoxia: Secondary | ICD-10-CM

## 2024-04-03 DIAGNOSIS — R0609 Other forms of dyspnea: Secondary | ICD-10-CM

## 2024-04-03 DIAGNOSIS — E1142 Type 2 diabetes mellitus with diabetic polyneuropathy: Secondary | ICD-10-CM

## 2024-04-03 DIAGNOSIS — J432 Centrilobular emphysema: Secondary | ICD-10-CM

## 2024-04-03 DIAGNOSIS — I4891 Unspecified atrial fibrillation: Secondary | ICD-10-CM | POA: Diagnosis not present

## 2024-04-03 LAB — ECHOCARDIOGRAM COMPLETE
Area-P 1/2: 5.93 cm2
Height: 69 in
S' Lateral: 3.5 cm
Weight: 4166.4 [oz_av]

## 2024-04-03 LAB — GLUCOSE, CAPILLARY
Glucose-Capillary: 195 mg/dL — ABNORMAL HIGH (ref 70–99)
Glucose-Capillary: 244 mg/dL — ABNORMAL HIGH (ref 70–99)
Glucose-Capillary: 137 mg/dL — ABNORMAL HIGH (ref 70–99)
Glucose-Capillary: 96 mg/dL (ref 70–99)

## 2024-04-03 MED ORDER — LUMATEPERONE TOSYLATE 42 MG PO CAPS
42.0000 mg | ORAL_CAPSULE | Freq: Every day | ORAL | Status: DC
  Administered 2024-04-03: 42 mg via ORAL
  Filled 2024-04-03: qty 1

## 2024-04-03 MED ORDER — VALBENAZINE TOSYLATE 60 MG PO CAPS
60.0000 mg | ORAL_CAPSULE | Freq: Every day | ORAL | Status: DC
  Administered 2024-04-03: 60 mg via ORAL
  Filled 2024-04-03: qty 1

## 2024-04-03 MED ORDER — PERFLUTREN LIPID MICROSPHERE
1.0000 mL | INTRAVENOUS | Status: AC | PRN
  Administered 2024-04-03: 3 mL via INTRAVENOUS

## 2024-04-03 MED ORDER — FUROSEMIDE 10 MG/ML IJ SOLN
40.0000 mg | Freq: Every day | INTRAMUSCULAR | Status: DC
  Administered 2024-04-04: 40 mg via INTRAVENOUS
  Filled 2024-04-03: qty 4

## 2024-04-03 NOTE — Consult Note (Addendum)
 Cardiology Consultation   Patient ID: Veronica Rivers MRN: [id number]; DOB: [birth year]  Admit date: 04/02/2024 Date of Consult: 04/03/2024  PCP:  Marvene Prentice SAUNDERS, FNP   [facility] Providers Cardiologist:  [name], MD        Patient Profile: Veronica Rivers is a 58 y.o. female with a hx of COPD, ADHD, sleep apnea, bipolar depression, type 2 diabetes, hyperlipidemia, obesity, prior history of tobacco use, and SVT who is being seen 04/03/2024 for the evaluation of narrow complex tachycardia at the request of Elgie Butter MD.  History of Present Illness: Veronica Rivers is a 58 year old female with prior cardiac history listed below.  In 2020 the patient was hospitalized for atypical chest pain that was felt to be related to GI concerns.  A nuclear stress test was done that did show an inferior lateral defect but this was felt to be artifact, the LVEF was 55 to 65%.  The patient has also had costochondritis in the past.  She has a 25-pack-year history of tobacco use and quit in 1999.  She has COPD and CT showed apical emphysema.  She wears oxygen at night.  On 10/2020 she was hospitalized for rectal bleeding.  During this hospitalization she required 2 units of blood.  GI performed a colonoscopy, polypectomy, and clipping of the bleeders in the hepatic flexure.  EGD on 10/23 at [facility] found esophageal candidiasis and a small hiatal hernia.  On 12/2022 the patient had a TTE bubble study that showed a normal LVEF of 60 to 65%, no RWMA, normal RV systolic function, normal PA pressure, mild dilated left atrium, moderately dilated right atrium, and no evidence of intra-atrial shunt.  Patient presented to the emergency department for worsening shortness of breath.  She was found to be in atrial fibrillation.  Patient was started on Eliquis  5 mg twice daily, and Coreg  12.5 mg twice daily.  On interview patient reported that 2 nights ago she had an episode of PND and a productive cough.   Stated that she has had orthopnea and lower extremity edema for 1 year.  Denies that lower extremity edema has been worse recently.  Has had chest pressure in the past 2 to 3 days but stated that it is worse with palpation.  Denies any prior history of atrial fibrillation.  Reported that she has a flutter sensation over the past few weeks and heart rates have been more elevated in the 100s to 120s.  Prior to this her heart rates were typically in the 60s.  Reported that she has had esophageal dilation in the past.  Patient reported that she has had worsening dysphagia over the past few weeks.  Reported that she uses delta 8 Gummies from the gas station.  Denies alcohol use or nicotine use.  Denied snoring or poor sleep quality but has had daytime somnolence.  Had a prior sleep study that showed mild sleep apnea.  Wears oxygen for this.  Labs showed elevated proBNP of 1185, negative high-sensitivity troponins x 2, potassium of 4.3, creatinine of 0.73, BUN of 17, WBC count of 9.1, hemoglobin of 14.2, and negative respiratory panel.  Chest x-ray showed Increased vascular congestion without overt pulmonary edema or focal airspace disease.  EKG showed coarse atrial fibrillation with a rate of 82.   Past Medical History:  Diagnosis Date   Anxiety    Asthma    Bipolar depression (HCC)    Cellulitis    CHEST PAIN    Depression  DM    DYSPNEA    Emphysema of lung (HCC)    HYPERCHOLESTEROLEMIA    HYPERLIPIDEMIA    HYPOTHYROIDISM    Neuropathy    Sleep apnea    home oxygen   SUPRAVENTRICULAR TACHYCARDIA     Past Surgical History:  Procedure Laterality Date   CESAREAN SECTION     COLONOSCOPY N/A 10/24/2020   Procedure: COLONOSCOPY;  Surgeon: Saintclair Jasper, MD;  Location: [facility];  Service: Gastroenterology;  Laterality: N/A;   FOOT SURGERY Left    HEMOSTASIS CLIP PLACEMENT  10/24/2020   Procedure: HEMOSTASIS CLIP PLACEMENT;  Surgeon: Saintclair Jasper, MD;  Location: [facility];  Service:  Gastroenterology;;   LUMBAR LAMINECTOMY/DECOMPRESSION MICRODISCECTOMY Right 11/16/2023   Procedure: LUMBAR LAMINECTOMY/DECOMPRESSION MICRODISCECTOMY RIGHT LUMBAR TWO-THREE;  Surgeon: Mavis Purchase, MD;  Location: [facility] OR;  Service: Neurosurgery;  Laterality: Right;   NASAL SEPTUM SURGERY     NECK SURGERY     TONSILLECTOMY     TUBAL LIGATION       Home Medications:  Prior to Admission medications   Medication Sig Start Date End Date Taking? Authorizing Provider  albuterol  (VENTOLIN  HFA) 108 (90 Base) MCG/ACT inhaler INHALE ONE TO TWO PUFFS BY MOUTH EVERY 4 HOURS AS NEEDED FOR FOR SHORTNESS OF BREATH OR WHEEZING Patient taking differently: Inhale 2 puffs into the lungs every 4 (four) hours as needed for wheezing or shortness of breath. 01/19/24  Yes Jude Harden GAILS, MD  amphetamine -dextroamphetamine  (ADDERALL) 20 MG tablet Take 20 mg by mouth See admin instructions. Take 20 mg by mouth in the morning and afternoon   Yes [provider]  B Complex-Biotin-FA TABS Take 1 tablet by mouth daily.   Yes [provider]  buPROPion  (WELLBUTRIN  XL) 150 MG 24 hr tablet Take 450 mg by mouth in the morning.   Yes [provider]  CAPLYTA  42 MG capsule Take 42 mg by mouth at bedtime.   Yes [provider]  carvedilol  (COREG ) 12.5 MG tablet TAKE 1 TABLET BY MOUTH 2 TIMES A DAY WITH A MEAL Patient taking differently: Take 12.5 mg by mouth See admin instructions. Take 12.5 mg by mouth in the morning and afternoon 03/05/24  Yes Delford Maude BROCKS, MD  Cholecalciferol (VITAMIN D3) 125 MCG (5000 UT) CAPS Take 15,000 Units by mouth in the morning. 02/07/20  Yes [provider]  Continuous Glucose Sensor (FREESTYLE LIBRE 3 PLUS SENSOR) MISC Inject 1 Device into the skin See admin instructions. Place 1 new sensor into the skin every 15 days   Yes [provider]  docusate sodium  (COLACE) 100 MG capsule Take 1 capsule (100 mg total) by mouth 2 (two) times daily. Patient  taking differently: Take 100 mg by mouth 2 (two) times daily as needed for mild constipation. 11/17/23  Yes Mavis Purchase, MD  DULoxetine  (CYMBALTA ) 30 MG capsule Take 90 mg by mouth at bedtime. 09/14/19  Yes [provider]  famotidine  (PEPCID ) 40 MG tablet Take 40 mg by mouth daily before breakfast.   Yes [provider]  furosemide  (LASIX ) 20 MG tablet TAKE 1 TABLET BY MOUTH DAILY Patient taking differently: Take 20 mg by mouth daily as needed for fluid or edema. 01/17/24  Yes Nishan, Peter C, MD  HUMALOG KWIKPEN 100 UNIT/ML KwikPen Inject 5 Units into the skin 3 (three) times daily as needed (for a BGL greater than 80, but less than 200).   Yes [provider]  hydrocortisone cream 0.5 % Apply 1 application  topically 2 (  two) times daily as needed (skin irritation.).   Yes [provider]  ibuprofen (ADVIL) 800 MG tablet Take 800 mg by mouth every 8 (eight) hours as needed for mild pain (pain score 1-3) (or headaches).   Yes [provider]  INGREZZA  60 MG capsule Take 60 mg by mouth at bedtime.   Yes [provider]  lamoTRIgine  (LAMICTAL ) 150 MG tablet Take 300 mg by mouth at bedtime.   Yes [provider]  LANTUS  SOLOSTAR 100 UNIT/ML Solostar Pen Inject 60 Units into the skin in the morning.   Yes [provider]  levothyroxine  (SYNTHROID ) 125 MCG tablet Take 125 mcg by mouth daily before breakfast.   Yes [provider]  methocarbamol  (ROBAXIN ) 500 MG tablet Take 500 mg by mouth at bedtime. 10/30/23  Yes [provider]  montelukast  (SINGULAIR ) 10 MG tablet TAKE 1 TABLET BY MOUTH AT BEDTIME 01/15/24  Yes Jude Harden GAILS, MD  nitroGLYCERIN  (NITROSTAT ) 0.4 MG SL tablet Place 1 tablet (0.4 mg total) under the tongue every 5 (five) minutes as needed for chest pain. 06/30/20 11/03/26 Yes Delford Maude BROCKS, MD  NON FORMULARY Take 1 capsule by mouth See admin instructions. [place] Mushroom Cognition/Brain  Supplement/Brain Vitamins/Focus Supplement capsules - Take 1 capsule by mouth once a day   Yes [provider]  NURTEC 75 MG TBDP Take 75 mg by mouth daily as needed (for migraines - dissolve orally).   Yes [provider]  nystatin  (MYCOSTATIN ) 100000 UNIT/ML suspension Take 5 mLs by mouth 2 (two) times daily as needed (for mouth irritation).   Yes [provider]  Omega-3 Fatty Acids (FISH OIL PO) Take 1 capsule by mouth every evening.   Yes [provider]  oxyCODONE -acetaminophen  (PERCOCET) 10-325 MG tablet Take 1 tablet by mouth 5 (five) times daily as needed for pain.   Yes [provider]  OXYGEN Inhale 2 L/min into the lungs continuous.   Yes [provider]  PROMETHEGAN 12.5 MG suppository Place 12.5 mg rectally 2 (two) times daily as needed for nausea or vomiting. 05/07/19  Yes [provider]  budesonide -formoterol  (SYMBICORT ) 160-4.5 MCG/ACT inhaler Inhale 2 puffs into the lungs 2 (two) times daily. Patient not taking: Reported on 04/02/2024 12/28/22   Jude Harden GAILS, MD  ONE TOUCH ULTRA TEST test strip Inject 1 strip into the skin daily. 10/11/17   [provider]  oxyCODONE -acetaminophen  (PERCOCET/ROXICET) 5-325 MG tablet Take 1 tablet by mouth every 4 (four) hours as needed for moderate pain (pain score 4-6). Patient not taking: Reported on 04/02/2024 11/17/23   Mavis Purchase, MD    Scheduled Meds:  amphetamine -dextroamphetamine   20 mg Oral BID   apixaban   5 mg Oral BID   buPROPion   450 mg Oral q AM   carvedilol   12.5 mg Oral BID WC   docusate sodium   100 mg Oral BID   DULoxetine   90 mg Oral QHS   famotidine   40 mg Oral QHS   insulin  aspart  0-15 Units Subcutaneous TID WC   insulin  aspart  0-5 Units Subcutaneous QHS   insulin  aspart  3 Units Subcutaneous TID WC   insulin  glargine-yfgn  25 Units Subcutaneous BID   lamoTRIgine   300 mg Oral QHS   levothyroxine   125 mcg Oral QAC breakfast   methocarbamol    500 mg Oral QHS   Continuous Infusions:  PRN Meds: albuterol , oxyCODONE -acetaminophen , perflutren  lipid microspheres (DEFINITY ) IV suspension, promethazine , SUMAtriptan   Allergies:    Allergies  Allergen  Reactions   Prilosec [Omeprazole ] Shortness Of Breath and Other (See Comments)    Made her breathing worse   Atorvastatin  Dermatitis   Canagliflozin Other (See Comments)    Yeast infections (Invokana)    Cyclobenzaprine Other (See Comments)    Crazy in the head and Dissociation   Dulaglutide Nausea And Vomiting and Other (See Comments)    TRULICITY   Empagliflozin Other (See Comments)    Yeast infections (Jardiance)   Ezetimibe  Other (See Comments)    Miscla epain   Metformin  Hcl Nausea And Vomiting   Mobic  [Meloxicam ] Other (See Comments)    Pt stated, Got Burning, Acid Reflux with medicine Pt should not be on any anti-inflammatories d/t elevated LFTs.   Ozempic (0.25 Or 0.5 Mg-Dose) [Semaglutide(0.25 Or 0.5mg -Dos)] Nausea And Vomiting   Pravastatin Dermatitis   Semaglutide Other (See Comments)    Stomach upset (Ozempic, Wegovy, and Rybelsus)   Tape    Valacyclovir  Other (See Comments)    Agitation    Simvastatin  Rash and Other (See Comments)    Caused muscle pain, too   Sulfa Antibiotics Rash and Dermatitis    Social History:   Social History   Socioeconomic History   Marital status: Married    Spouse name: [place]   Number of children: 1   Years of education: 16   Highest education level: Not on file  Occupational History   Occupation: Unemployed  Tobacco Use   Smoking status: Former    Current packs/day: 0.00    Types: Cigarettes    Start date: 11/24/1982    Quit date: 11/23/1997    Years since quitting: 26.3   Smokeless tobacco: Never  Vaping Use   Vaping status: Never Used  Substance and Sexual Activity   Alcohol use: No   Drug use: Yes    Types: Marijuana    Comment: THC gummies   Sexual activity: Yes    Birth control/protection:  Surgical  Other Topics Concern   Not on file  Social History Narrative   Married.  Lives with husband.  Independent of ADLs and with ambulation.   Social Drivers of Corporate Investment Banker Strain: Not on file  Food Insecurity: No Food Insecurity (04/02/2024)   Hunger Vital Sign    Worried About Running Out of Food in the Last Year: Never true    Ran Out of Food in the Last Year: Never true  Transportation Needs: No Transportation Needs (04/02/2024)   PRAPARE - Administrator, Civil Service (Medical): No    Lack of Transportation (Non-Medical): No  Physical Activity: Not on file  Stress: Not on file  Social Connections: Not on file  Intimate Partner Violence: Not At Risk (04/02/2024)   Humiliation, Afraid, Rape, and Kick questionnaire    Fear of Current or Ex-Partner: No    Emotionally Abused: No    Physically Abused: No    Sexually Abused: No    Family History:    Family History  Problem Relation Age of Onset   Hypertension Mother    COPD Mother        Died age 44   Heart disease Mother        had ICD, coronary vasospasm   Heart attack Mother        first MI at age 11   Cancer Father        Lung, died in his 41s     ROS:  Please see the history of present illness.  All other ROS reviewed and negative.     Physical Exam/Data: Vitals:   04/02/24 1946 04/02/24 2350 04/03/24 0409 04/03/24 0836  BP: (!) 141/93 135/88 (!) 151/81 (!) 150/76  Pulse: 86 77 80 77  Resp:      Temp: 97.8 F (36.6 C) 97.8 F (36.6 C) 97.8 F (36.6 C)   TempSrc: Oral Oral Oral   SpO2: 92% 97% 94% 93%  Weight:      Height:        Intake/Output Summary (Last 24 hours) at 04/03/2024 1003 Last data filed at 04/03/2024 9178 Gross per 24 hour  Intake 480 ml  Output 1600 ml  Net -1120 ml      04/02/2024    4:11 PM 11/16/2023    5:51 AM 11/09/2023    4:08 PM  Last 3 Weights  Weight (lbs) 260 lb 6.4 oz 271 lb 9.7 oz 271 lb 12.8 oz  Weight (kg) 118.117 kg 123.2 kg  123.288 kg     Body mass index is 38.45 kg/m.  General:  Well nourished, well developed, in no acute distress HEENT: normal Neck: no JVD Vascular: No carotid bruits; Distal pulses 2+ bilaterally Cardiac:  normal S1, S2; RRR; no murmur  Lungs:  clear to auscultation bilaterally, no wheezing, rhonchi or rales  Abd: soft, nontender, no hepatomegaly  Ext: no edema Musculoskeletal:  No deformities, BUE and BLE strength normal and equal Skin: warm and dry  Neuro:   no focal abnormalities noted Psych:  Normal affect   EKG:  The EKG was personally reviewed and demonstrates:  atrial fibrillation with a rate of 82. Telemetry:  Telemetry was personally reviewed and demonstrates: Atrial fibrillation with heart rates in the 70s to 110s.  Overall heart rates are well-controlled.  Relevant CV Studies: Echo pending  Laboratory Data: High Sensitivity Troponin:  No results for input(s): TROPONINIHS in the last 720 hours.   Chemistry Recent Labs  Lab 04/02/24 1258  NA 139  K 4.3  CL 101  CO2 30  GLUCOSE 230*  BUN 17  CREATININE 0.73  CALCIUM  9.7  GFRNONAA >60  ANIONGAP 9    Recent Labs  Lab 04/02/24 1258  PROT 7.2  ALBUMIN 4.3  AST 14*  ALT 10  ALKPHOS 92  BILITOT 0.3   Lipids No results for input(s): CHOL, TRIG, HDL, LABVLDL, LDLCALC, CHOLHDL in the last 168 hours.  Hematology Recent Labs  Lab 04/02/24 1258  WBC 9.1  RBC 4.68  HGB 14.2  HCT 44.4  MCV 94.9  MCH 30.3  MCHC 32.0  RDW 13.0  PLT 285   Thyroid No results for input(s): TSH, FREET4 in the last 168 hours.  BNP Recent Labs  Lab 04/02/24 1258  PROBNP 1,185.0*    DDimer No results for input(s): DDIMER in the last 168 hours.  Radiology/Studies:  DG Chest Portable 1 View Result Date: 04/02/2024 CLINICAL DATA:  Shortness of breath.  On home oxygen. EXAM: PORTABLE CHEST 1 VIEW COMPARISON:  Radiographs 06/22/2023 and 07/11/2022.  CT 11/30/2022. FINDINGS: 1354 hours. The heart size and  mediastinal contours are grossly stable allowing for the portable semi erect nature of the current study. There is increased vascular congestion without overt pulmonary edema. No confluent airspace disease, pneumothorax or significant pleural effusion. Previous lower cervical fusion without evidence of acute osseous abnormality. Telemetry leads overlie the chest. IMPRESSION: Increased vascular congestion without overt pulmonary edema or focal airspace disease. Electronically Signed   By: Elsie Perone M.D.   On: 04/02/2024  15:30     Assessment and Plan: Veronica Rivers is a 58 y.o. female with a hx of COPD, ADHD, sleep apnea, bipolar depression, type 2 diabetes, hyperlipidemia, obesity, prior history of tobacco use, and SVT who is being seen 04/03/2024 for the evaluation of narrow complex tachycardia at the request of Elgie Butter MD.  New onset atrial fibrillation Denies any prior history of atrial fibrillation.  Reported that she has had palpitations and chest flutter for the past 2 to 3 weeks.  Also stated that her heart rates have been more elevated in the 100s to 120s.  Patient also reported that she had worsening dysphagia over the past few weeks and that she has had esophageal dilations in the past.  She had an EGD done on 02/2022 at [facility] that found esophageal candidiasis and a small hiatal hernia.  Denies any melena, hematochezia, or hematuria. EKG showed coarse atrial fibrillation with a rate of 82. Patient was started on Eliquis  5 mg twice daily, and Coreg  12.5 mg twice daily.  Patient's heart rates have improved on telemetry. Continue Eliquis  5 mg twice daily Continue Coreg  12.5 mg twice daily Ordered TSH and mag May consider TEE cardioversion but suspect patient will need an EGD done prior to this.  Discussed remaining on Eliquis  for 3 weeks and planning for an outpatient cardioversion but patient did not want to wait that long.   Heart failure Patient stated that she had an episode  of PND and a productive cough.  Also reported that she has had orthopnea and lower extreme edema for about the past year. Labs showed elevated proBNP of 1185.  Negative high-sensitivity troponins x 2 Chest x-ray showed Increased vascular congestion without overt pulmonary edema or focal airspace disease. Echo pending GDMT will wait for echo results prior to GDMT.   Hypertension Pressures have been elevated most recent BP was 150/76.  Will prioritize rate control and GDMT as per A-fib and heart failure above.   Cannabis use Reported that she takes delta 8 Gummies from the gas station.   Signed, Morse Clause, PA-C  04/03/2024 10:29 AM   Risk Assessment/Risk Scores:     New York  Heart Association (NYHA) Functional Class NYHA Class II  CHA2DS2-VASc Score = 4   This indicates a 4.8% annual risk of stroke. The patient's score is based upon: CHF History: 1 HTN History: 1 Diabetes History: 1 Stroke History: 0 Vascular Disease History: 0 Age Score: 0 Gender Score: 1   For questions or updates, please contact  HeartCare Please consult [email or web address] for contact info under  Signed, Morse Clause, PA-C  04/03/2024 10:03 AM  Patient seen and examined, note reviewed with the signed Advanced Practice Provider. I personally reviewed laboratory data, imaging studies and relevant notes. I independently examined the patient and formulated the important aspects of the plan. I have personally discussed the plan with the patient and/or family. Comments or changes to the note/plan are indicated below.  Patient seen and examined at her bedside. She shortness of breath has improved.   New onset atrial fibrillation  Acute on Chronic diastolic heart failure Hypertensive heart disease with hear failure  COPD with chronic hypoxia respiratory failure on home oxygen   She is in new onset atrial fibrillation however she is currently rate controlled.  I spoke with the patient and her husband  at length at the bedside.  At this point there is concern for dysphagia but she is adamant that she may just be experiencing dry  throat.  If she warrants an inpatient cardioversion it would be beneficial to have her scoped by GI before TEE cardioversion can be done.  In any rate I expressed that to be safe and avoid any potential risk he will be beneficial that she continue her anticoagulation for 3 weeks and then have a outpatient cardioversion.  In the meantime I agree with the use of diuretics we will go ahead and increase that to Lasix  40 mg daily IV for 1 more day and then transition to p.o. Lasix   Her echocardiogram is pending.  Once we are able to get her echo further recommendations will be made if needed of additional guideline directed medical therapy.   She is on chronic oxygen at home for her chronic hypoxic respiratory failure.  Will continue to follow with you.  Ginelle Bays DO, MS [facility] Attending Cardiologist [facility]  [address] [place], [place] [address] [phone] Website: [email or web address]

## 2024-04-03 NOTE — Telephone Encounter (Signed)
 Pharmacy Patient Advocate Encounter  Insurance verification completed.    The patient is insured through [employer]. Patient has [employer], may use a copay card, and/or apply for patient assistance if available.    Ran test claim for Eliquis 5mg  and the current 30 day co-pay is $0.   This test claim was processed through [facility]- copay amounts may vary at other pharmacies due to [employer], or as the patient moves through the different stages of their insurance plan.

## 2024-04-03 NOTE — Progress Notes (Signed)
 Heart Failure Navigator Progress Note  Assessed for Heart & Vascular TOC clinic readiness.  Patient does not meet criteria due to EF 50-55%, has a scheduled [facility] appointment on 05/07/2024. No HF TOC. .   Navigator will sign off at this time.   Stephane Haddock, BSN, Scientist, Clinical (histocompatibility And Immunogenetics) Only

## 2024-04-03 NOTE — Progress Notes (Signed)
 Chaplains received a consult to assist Veronica Rivers with advance directives.  I provided the paperwork and asked if she would like some education on the document.  She said she would like to just look it over.  If she has questions or other needs arise, please page us  at [phone].

## 2024-04-03 NOTE — Plan of Care (Signed)
  Problem: Education: Goal: Knowledge of General Education information will improve Description: Including pain rating scale, medication(s)/side effects and non-pharmacologic comfort measures Outcome: Progressing   Problem: Clinical Measurements: Goal: Ability to maintain clinical measurements within normal limits will improve Outcome: Progressing   Problem: Activity: Goal: Risk for activity intolerance will decrease Outcome: Progressing   Problem: Nutrition: Goal: Adequate nutrition will be maintained Outcome: Progressing   Problem: Elimination: Goal: Will not experience complications related to bowel motility Outcome: Progressing Goal: Will not experience complications related to urinary retention Outcome: Progressing   Problem: Pain Managment: Goal: General experience of comfort will improve and/or be controlled Outcome: Progressing   Problem: Safety: Goal: Ability to remain free from injury will improve Outcome: Progressing   Problem: Metabolic: Goal: Ability to maintain appropriate glucose levels will improve Outcome: Progressing

## 2024-04-03 NOTE — Progress Notes (Signed)
 [facility]                                                                               Veronica Rivers, is a 58 y.o. female, DOB - October 30, 1965, FMW:[id number] Admit date - 04/02/2024    Outpatient Primary MD for the patient is Veronica Prentice SAUNDERS, FNP  LOS - 1  days    Brief summary    Veronica Rivers is a 58 y.o. female past medical history of supraventricular tachycardia COPD emphysematous/chronic respiratory failure with hypoxia on 2 L of oxygen, bipolar disorder with depression who comes in to the hospital for orthopnea and dyspnea on exertion that started 3 to 4 days prior to admission progressively getting worse.  She relates she is also been experiencing lower extremity swelling and what seems like a productive cough.   Assessment & Plan    Assessment and Plan:  New onset atrial fibrillation: Rate controlled with coreg  . Echocardiogram done and pending.  Strict intake and output.  TSH is pending Continue with Eliquis  5 mg twice daily     Type 2 DM insulin  dependent:   Hemoglobin A1c at 7.4 CBG (last 3)  Recent Labs    04/02/24 2115 04/03/24 0759 04/03/24 1204  GLUCAP 157* 195* 244*   Resume SSI.  Continue Semglee  25 units daily.   Hypertension Continue with Coreg    Hypothyroidism Continue Synthroid .   Bipolar disorder Resume home medications.    Mild acute on chronic respiratory failure with hypoxia on 2 L of nasal cannula oxygen Echocardiogram to evaluate for CHF. Chest x-ray showing pulmonary edema Patient was started on IV Lasix  40 mg continue the same for another 24 hours and monitor urine output. Cardiology on board and appreciate recommendations  Estimated body mass index is 38.45 kg/m as calculated from the following:   Height as of this encounter: 5' 9 (1.753 m).   Weight as of this encounter: 118.1 kg.  Code Status: Full code DVT Prophylaxis:   apixaban  (ELIQUIS ) tablet 5 mg   Level of Care: Level of care:  Telemetry Family Communication: Updated patient's family at bedside  Disposition Plan:     Remains inpatient appropriate: Pending clinical improvement  Procedures:  Echo.  Consultants:   Cardiology.   Antimicrobials:   Anti-infectives (From admission, onward)    None        Medications  Scheduled Meds:  amphetamine -dextroamphetamine   20 mg Oral BID   apixaban   5 mg Oral BID   buPROPion   450 mg Oral q AM   carvedilol   12.5 mg Oral BID WC   docusate sodium   100 mg Oral BID   DULoxetine   90 mg Oral QHS   famotidine   40 mg Oral QHS   [START ON 04/04/2024] furosemide   40 mg Intravenous Daily   insulin  aspart  0-15 Units Subcutaneous TID WC   insulin  aspart  0-5 Units Subcutaneous QHS   insulin  aspart  3 Units Subcutaneous TID WC   insulin  glargine-yfgn  25 Units Subcutaneous BID   lamoTRIgine   300 mg Oral QHS   levothyroxine   125 mcg Oral QAC breakfast   lumateperone  tosylate  42 mg Oral Daily   methocarbamol   500 mg Oral QHS   valbenazine   60 mg Oral Daily   Continuous Infusions: PRN Meds:.albuterol , oxyCODONE -acetaminophen , promethazine , SUMAtriptan     Subjective:   Veronica Rivers was seen and examined today.  Wants to know if she can go home. No chest pain. Has headache.   Objective:   Vitals:   04/02/24 1946 04/02/24 2350 04/03/24 0409 04/03/24 0836  BP: (!) 141/93 135/88 (!) 151/81 (!) 150/76  Pulse: 86 77 80 77  Resp:      Temp: 97.8 F (36.6 C) 97.8 F (36.6 C) 97.8 F (36.6 C)   TempSrc: Oral Oral Oral   SpO2: 92% 97% 94% 93%  Weight:      Height:        Intake/Output Summary (Last 24 hours) at 04/03/2024 1241 Last data filed at 04/03/2024 0900 Gross per 24 hour  Intake 720 ml  Output 1600 ml  Net -880 ml   Filed Weights   04/02/24 1611  Weight: 118.1 kg     Exam General: Alert and oriented x 3, NAD Cardiovascular: S1 S2 auscultated, no murmurs, irregularly irregular.  Respiratory: Clear to auscultation bilaterally,   Gastrointestinal: Soft, nontender, nondistended, + bowel sounds Ext: no pedal edema bilaterally Neuro: AAOx3,  Skin: No rashes Psych:alert and oriented x3    Data Reviewed:  I have personally reviewed following labs and imaging studies   CBC Lab Results  Component Value Date   WBC 9.1 04/02/2024   RBC 4.68 04/02/2024   HGB 14.2 04/02/2024   HCT 44.4 04/02/2024   MCV 94.9 04/02/2024   MCH 30.3 04/02/2024   PLT 285 04/02/2024   MCHC 32.0 04/02/2024   RDW 13.0 04/02/2024   LYMPHSABS 2.4 07/18/2022   MONOABS 0.8 07/18/2022   EOSABS 0.2 07/18/2022   BASOSABS 0.0 07/18/2022     Last metabolic panel Lab Results  Component Value Date   NA 139 04/02/2024   K 4.3 04/02/2024   CL 101 04/02/2024   CO2 30 04/02/2024   BUN 17 04/02/2024   CREATININE 0.73 04/02/2024   GLUCOSE 230 (H) 04/02/2024   GFRNONAA >60 04/02/2024   GFRAA 102 02/26/2019   CALCIUM  9.7 04/02/2024   PROT 7.2 04/02/2024   ALBUMIN 4.3 04/02/2024   BILITOT 0.3 04/02/2024   ALKPHOS 92 04/02/2024   AST 14 (L) 04/02/2024   ALT 10 04/02/2024   ANIONGAP 9 04/02/2024    CBG (last 3)  Recent Labs    04/02/24 2115 04/03/24 0759 04/03/24 1204  GLUCAP 157* 195* 244*      Coagulation Profile: No results for input(s): INR, PROTIME in the last 168 hours.   Radiology Studies: DG Chest Portable 1 View Result Date: 04/02/2024 CLINICAL DATA:  Shortness of breath.  On home oxygen. EXAM: PORTABLE CHEST 1 VIEW COMPARISON:  Radiographs 06/22/2023 and 07/11/2022.  CT 11/30/2022. FINDINGS: 1354 hours. The heart size and mediastinal contours are grossly stable allowing for the portable semi erect nature of the current study. There is increased vascular congestion without overt pulmonary edema. No confluent airspace disease, pneumothorax or significant pleural effusion. Previous lower cervical fusion without evidence of acute osseous abnormality. Telemetry leads overlie the chest. IMPRESSION: Increased vascular  congestion without overt pulmonary edema or focal airspace disease. Electronically Signed   By: Elsie Perone M.D.   On: 04/02/2024 15:30       Elgie Butter M.D. [facility] 04/03/2024, 12:41 PM  Available via Epic secure chat 7am-7pm After 7 pm, please refer to night coverage provider listed on  amion.

## 2024-04-04 DIAGNOSIS — E1142 Type 2 diabetes mellitus with diabetic polyneuropathy: Secondary | ICD-10-CM | POA: Diagnosis not present

## 2024-04-04 DIAGNOSIS — I4891 Unspecified atrial fibrillation: Secondary | ICD-10-CM | POA: Diagnosis not present

## 2024-04-04 DIAGNOSIS — I10 Essential (primary) hypertension: Secondary | ICD-10-CM | POA: Diagnosis not present

## 2024-04-04 DIAGNOSIS — J9611 Chronic respiratory failure with hypoxia: Secondary | ICD-10-CM | POA: Diagnosis not present

## 2024-04-04 LAB — TSH: TSH: 2.36 u[IU]/mL (ref 0.350–4.500)

## 2024-04-04 LAB — COMPREHENSIVE METABOLIC PANEL WITH GFR
ALT: 11 U/L (ref 0–44)
AST: 13 U/L — ABNORMAL LOW (ref 15–41)
Albumin: 4.1 g/dL (ref 3.5–5.0)
Alkaline Phosphatase: 77 U/L (ref 38–126)
Anion gap: 10 (ref 5–15)
BUN: 12 mg/dL (ref 6–20)
CO2: 33 mmol/L — ABNORMAL HIGH (ref 22–32)
Calcium: 9.9 mg/dL (ref 8.9–10.3)
Chloride: 97 mmol/L — ABNORMAL LOW (ref 98–111)
Creatinine, Ser: 0.77 mg/dL (ref 0.44–1.00)
GFR, Estimated: >60 mL/min (ref >60)
Glucose, Bld: 189 mg/dL — ABNORMAL HIGH (ref 70–99)
Potassium: 3.3 mmol/L — ABNORMAL LOW (ref 3.5–5.1)
Sodium: 139 mmol/L (ref 135–145)
Total Bilirubin: 0.4 mg/dL (ref 0.0–1.2)
Total Protein: 6.9 g/dL (ref 6.5–8.1)

## 2024-04-04 LAB — MAGNESIUM: Magnesium: 2.2 mg/dL (ref 1.7–2.4)

## 2024-04-04 LAB — GLUCOSE, CAPILLARY
Glucose-Capillary: 171 mg/dL — ABNORMAL HIGH (ref 70–99)
Glucose-Capillary: 132 mg/dL — ABNORMAL HIGH (ref 70–99)

## 2024-04-04 MED ORDER — FUROSEMIDE 40 MG PO TABS: 40.0000 mg | ORAL_TABLET | Freq: Every day | ORAL | 11 refills | Status: DC

## 2024-04-04 MED ORDER — FUROSEMIDE 40 MG PO TABS: 40.0000 mg | ORAL_TABLET | Freq: Every day | ORAL | 11 refills | Status: AC

## 2024-04-04 MED ORDER — APIXABAN 5 MG PO TABS: 5.0000 mg | ORAL_TABLET | Freq: Two times a day (BID) | ORAL | 2 refills | Status: DC

## 2024-04-04 MED ORDER — APIXABAN 5 MG PO TABS: 5.0000 mg | ORAL_TABLET | Freq: Two times a day (BID) | ORAL | 2 refills | Status: AC

## 2024-04-04 NOTE — Discharge Summary (Signed)
 Physician Discharge Summary   Patient: Veronica Rivers MRN: [id number] DOB: May 06, 1966  Admit date:     04/02/2024  Discharge date: 04/04/24  Discharge Physician: Elgie Butter   PCP: Marvene Prentice SAUNDERS, FNP   Recommendations at discharge:  Please follow up with cardiology as scheduled.  Please follow up with cbc and bmp in one week.   Discharge Diagnoses: Principal Problem:   Dyspnea Active Problems:   Type 2 diabetes mellitus (HCC)   Essential hypertension   Supraventricular tachycardia   Bipolar depression (HCC)   Centrilobular emphysema (HCC)   Chronic respiratory failure with hypoxia (HCC)   Atrial fibrillation, controlled (HCC)   New onset a-fib (HCC)  Resolved Problems:   * No resolved hospital problems. *  Hospital Course:  Veronica Rivers is a 58 y.o. female past medical history of supraventricular tachycardia COPD emphysematous/chronic respiratory failure with hypoxia on 2 L of oxygen, bipolar disorder with depression who comes in to the hospital for orthopnea and dyspnea on exertion that started 3 to 4 days prior to admission progressively getting worse. She relates she is also been experiencing lower extremity swelling and what seems like a productive cough.    Assessment and Plan:    New onset atrial fibrillation: Rate controlled with coreg  . Echocardiogram done and pending.  Strict intake and output.  TSH is pending Continue with Eliquis  5 mg twice daily         Type 2 DM insulin  dependent:    Hemoglobin A1c at 7.4 Resume home meds.    Hypertension Continue with Coreg      Hypothyroidism Continue Synthroid .     Bipolar disorder Resume home medications.       Mild acute on chronic respiratory failure with hypoxia on 2 L of nasal cannula oxygen Echocardiogram to evaluate for CHF. Chest x-ray showing pulmonary edema Patient was started on IV Lasix  40 mg daily, transition to oral lasix  40 mg daily on discharge.  Cardiology on board and appreciate  recommendations   Estimated body mass index is 38.45 kg/m as calculated from the following:   Height as of this encounter: 5' 9 (1.753 m).   Weight as of this encounter: 118.1 kg.    Consultants: cardiology.  Procedures performed: echo  Disposition: Home Diet recommendation:  Discharge Diet Orders (From admission, onward)     Start     Ordered   04/04/24 0000  Diet - low sodium heart healthy        04/04/24 1113           Cardiac diet DISCHARGE MEDICATION: Allergies as of 04/04/2024       Reactions   Prilosec [omeprazole ] Shortness Of Breath, Other (See Comments)   Made her breathing worse   Atorvastatin  Dermatitis   Canagliflozin Other (See Comments)   Yeast infections (Invokana)   Cyclobenzaprine Other (See Comments)   Crazy in the head and Dissociation   Dulaglutide Nausea And Vomiting, Other (See Comments)   TRULICITY   Empagliflozin Other (See Comments)   Yeast infections (Jardiance)   Ezetimibe  Other (See Comments)   Miscla epain   Metformin  Hcl Nausea And Vomiting   Mobic  [meloxicam ] Other (See Comments)   Pt stated, Got Burning, Acid Reflux with medicine Pt should not be on any anti-inflammatories d/t elevated LFTs.   Ozempic (0.25 Or 0.5 Mg-dose) [semaglutide(0.25 Or 0.5mg -dos)] Nausea And Vomiting   Pravastatin Dermatitis   Semaglutide Other (See Comments)   Stomach upset (Ozempic, Wegovy, and Rybelsus)   Tape  Valacyclovir  Other (See Comments)   Agitation    Simvastatin  Rash, Other (See Comments)   Caused muscle pain, too   Sulfa Antibiotics Rash, Dermatitis        Medication List     STOP taking these medications    ibuprofen 800 MG tablet Commonly known as: ADVIL       TAKE these medications    albuterol  108 (90 Base) MCG/ACT inhaler Commonly known as: VENTOLIN  HFA INHALE ONE TO TWO PUFFS BY MOUTH EVERY 4 HOURS AS NEEDED FOR FOR SHORTNESS OF BREATH OR WHEEZING What changed: See the new instructions.    amphetamine -dextroamphetamine  20 MG tablet Commonly known as: ADDERALL Take 20 mg by mouth See admin instructions. Take 20 mg by mouth in the morning and afternoon   apixaban  5 MG Tabs tablet Commonly known as: ELIQUIS  Take 1 tablet (5 mg total) by mouth 2 (two) times daily.   B Complex-Biotin-FA Tabs Take 1 tablet by mouth daily.   budesonide -formoterol  160-4.5 MCG/ACT inhaler Commonly known as: Symbicort  Inhale 2 puffs into the lungs 2 (two) times daily.   buPROPion  150 MG 24 hr tablet Commonly known as: WELLBUTRIN  XL Take 450 mg by mouth in the morning.   Caplyta  42 MG capsule Generic drug: lumateperone  tosylate Take 42 mg by mouth at bedtime.   carvedilol  12.5 MG tablet Commonly known as: COREG  TAKE 1 TABLET BY MOUTH 2 TIMES A DAY WITH A MEAL What changed: See the new instructions.   docusate sodium  100 MG capsule Commonly known as: COLACE Take 1 capsule (100 mg total) by mouth 2 (two) times daily. What changed:  when to take this reasons to take this   DULoxetine  30 MG capsule Commonly known as: CYMBALTA  Take 90 mg by mouth at bedtime.   famotidine  40 MG tablet Commonly known as: PEPCID  Take 40 mg by mouth daily before breakfast.   FISH OIL PO Take 1 capsule by mouth every evening.   FreeStyle Libre 3 Plus Sensor Misc Inject 1 Device into the skin See admin instructions. Place 1 new sensor into the skin every 15 days   furosemide  40 MG tablet Commonly known as: Lasix  Take 1 tablet (40 mg total) by mouth daily. Start taking on: April 05, 2024 What changed:  medication strength how much to take   HumaLOG KwikPen 100 UNIT/ML KwikPen Generic drug: insulin  lispro Inject 5 Units into the skin 3 (three) times daily as needed (for a BGL greater than 80, but less than 200).   hydrocortisone cream 0.5 % Apply 1 application  topically 2 (two) times daily as needed (skin irritation.).   Ingrezza  60 MG capsule Generic drug: valbenazine  Take 60 mg by  mouth at bedtime.   lamoTRIgine  150 MG tablet Commonly known as: LAMICTAL  Take 300 mg by mouth at bedtime.   Lantus  SoloStar 100 UNIT/ML Solostar Pen Generic drug: insulin  glargine Inject 60 Units into the skin in the morning.   levothyroxine  125 MCG tablet Commonly known as: SYNTHROID  Take 125 mcg by mouth daily before breakfast.   methocarbamol  500 MG tablet Commonly known as: ROBAXIN  Take 500 mg by mouth at bedtime.   montelukast  10 MG tablet Commonly known as: SINGULAIR  TAKE 1 TABLET BY MOUTH AT BEDTIME   nitroGLYCERIN  0.4 MG SL tablet Commonly known as: NITROSTAT  Place 1 tablet (0.4 mg total) under the tongue every 5 (five) minutes as needed for chest pain.   NON FORMULARY Take 1 capsule by mouth See admin instructions. [place] [place]ane Mushroom Cognition/Brain Supplement/Brain Vitamins/Focus  Supplement capsules - Take 1 capsule by mouth once a day   Nurtec 75 MG Tbdp Generic drug: Rimegepant Sulfate Take 75 mg by mouth daily as needed (for migraines - dissolve orally).   nystatin  100000 UNIT/ML suspension Commonly known as: MYCOSTATIN  Take 5 mLs by mouth 2 (two) times daily as needed (for mouth irritation).   ONE TOUCH ULTRA TEST test strip Generic drug: glucose blood Inject 1 strip into the skin daily.   oxyCODONE -acetaminophen  10-325 MG tablet Commonly known as: PERCOCET Take 1 tablet by mouth 5 (five) times daily as needed for pain. What changed: Another medication with the same name was removed. Continue taking this medication, and follow the directions you see here.   OXYGEN Inhale 2 L/min into the lungs continuous.   Promethegan 12.5 MG suppository Generic drug: promethazine  Place 12.5 mg rectally 2 (two) times daily as needed for nausea or vomiting.   Vitamin D3 125 MCG (5000 UT) Caps Take 15,000 Units by mouth in the morning.        Follow-up Information     Terra Fairy PARAS, PA-C Follow up on 04/24/2024.   Specialty: Cardiology Why: @11AM . Afib  clinic visit Contact information: [address] [place] [address] [phone]         Delford Maude BROCKS, MD Follow up on 05/07/2024.   Specialty: Cardiology Why: 10:30AM. Cardiology follow up Contact information: [address] [place] [address] [phone]         Marvene Prentice SAUNDERS, FNP. Schedule an appointment as soon as possible for a visit in 1 week(s).   Specialty: Family Medicine Contact information: [address] [place] [address] [phone]                Discharge Exam: Fredricka Weights   04/02/24 1611  Weight: 118.1 kg   General exam: Appears calm and comfortable  Respiratory system: Clear to auscultation. Respiratory effort normal. Cardiovascular system: S1 & S2 heard, RRR. No JVD,  Gastrointestinal system: Abdomen is nondistended, soft and nontender.  Central nervous system: Alert and oriented. No focal neurological deficits. Extremities: Symmetric 5 x 5 power. Skin: No rashes,  Psychiatry:  Mood & affect appropriate.    Condition at discharge: fair  The results of significant diagnostics from this hospitalization (including imaging, microbiology, ancillary and laboratory) are listed below for reference.   Imaging Studies: ECHOCARDIOGRAM COMPLETE Result Date: 04/03/2024    ECHOCARDIOGRAM REPORT   Patient Name:   Veronica Rivers Date of Exam: 04/03/2024 Medical Rec #:  [id number]     Height:       69.0 in Accession #:    [id number]    Weight:       260.4 lb Date of Birth:  16-May-1965     BSA:          2.311 m Patient Age:    58 years      BP:           151/81 mmHg Patient Gender: F             HR:           97 bpm. Exam Location:  Inpatient Procedure: 2D Echo, Cardiac Doppler, Color Doppler and Intracardiac            Opacification Agent (Both Spectral and Color Flow Doppler were            utilized during procedure). Indications:    Dyspnea  History:        Patient  has prior history of Echocardiogram examinations, most                  recent 12/12/2022. Signs/Symptoms:Shortness of Breath and Dyspnea;                 Risk Factors:Diabetes, Dyslipidemia, Sleep Apnea and Family                 History of Coronary Artery Disease.  Sonographer:    Philomena Daring Referring Phys: [id number] ABRAHAM FELIZ ORTIZ IMPRESSIONS  1. Left ventricular ejection fraction, by estimation, is 50 to 55%. The left ventricle has low normal function. The left ventricle has no regional wall motion abnormalities. There is mild left ventricular hypertrophy. Left ventricular diastolic parameters are indeterminate.  2. Right ventricular systolic function is normal. The right ventricular size is normal. Tricuspid regurgitation signal is inadequate for assessing PA pressure.  3. The mitral valve is normal in structure. Trivial mitral valve regurgitation. No evidence of mitral stenosis.  4. The aortic valve was not well visualized. Aortic valve regurgitation is not visualized. No aortic stenosis is present.  5. The inferior vena cava is dilated in size with <50% respiratory variability, suggesting right atrial pressure of 15 mmHg. FINDINGS  Left Ventricle: Left ventricular ejection fraction, by estimation, is 50 to 55%. The left ventricle has low normal function. The left ventricle has no regional wall motion abnormalities. Definity  contrast agent was given IV to delineate the left ventricular endocardial borders. The left ventricular internal cavity size was normal in size. There is mild left ventricular hypertrophy. Left ventricular diastolic parameters are indeterminate. Right Ventricle: The right ventricular size is normal. No increase in right ventricular wall thickness. Right ventricular systolic function is normal. Tricuspid regurgitation signal is inadequate for assessing PA pressure. Left Atrium: Left atrial size was normal in size. Right Atrium: Right atrial size was normal in size. Pericardium: There is no evidence of pericardial effusion. Mitral Valve: The mitral  valve is normal in structure. Trivial mitral valve regurgitation. No evidence of mitral valve stenosis. Tricuspid Valve: The tricuspid valve is normal in structure. Tricuspid valve regurgitation is trivial. Aortic Valve: The aortic valve was not well visualized. Aortic valve regurgitation is not visualized. No aortic stenosis is present. Pulmonic Valve: The pulmonic valve was not well visualized. Pulmonic valve regurgitation is trivial. Aorta: The aortic root and ascending aorta are structurally normal, with no evidence of dilitation. Venous: The inferior vena cava is dilated in size with less than 50% respiratory variability, suggesting right atrial pressure of 15 mmHg. IAS/Shunts: The interatrial septum was not well visualized.  LEFT VENTRICLE PLAX 2D LVIDd:         4.80 cm   Diastology LVIDs:         3.50 cm   LV e' medial:    8.92 cm/s LV PW:         1.00 cm   LV E/e' medial:  10.3 LV IVS:        1.00 cm   LV e' lateral:   10.90 cm/s LVOT diam:     2.10 cm   LV E/e' lateral: 8.4 LV SV:         55 LV SV Index:   24 LVOT Area:     3.46 cm  RIGHT VENTRICLE             IVC RV S prime:     11.60 cm/s  IVC diam: 2.40 cm TAPSE (M-mode): 2.4 cm LEFT ATRIUM  Index        RIGHT ATRIUM           Index LA diam:        4.50 cm 1.95 cm/m   RA Area:     18.60 cm LA Vol (A2C):   69.0 ml 29.85 ml/m  RA Volume:   45.90 ml  19.86 ml/m LA Vol (A4C):   49.6 ml 21.46 ml/m LA Biplane Vol: 61.7 ml 26.70 ml/m  AORTIC VALVE LVOT Vmax:   74.20 cm/s LVOT Vmean:  56.500 cm/s LVOT VTI:    0.158 m  AORTA Ao Root diam: 2.50 cm Ao Asc diam:  2.80 cm MITRAL VALVE MV Area (PHT): 5.93 cm    SHUNTS MV Decel Time: 128 msec    Systemic VTI:  0.16 m MV E velocity: 91.90 cm/s  Systemic Diam: 2.10 cm MV A velocity: 31.20 cm/s MV E/A ratio:  2.95 Lonni Nanas MD Electronically signed by Lonni Nanas MD Signature Date/Time: 04/03/2024/1:10:50 PM    Final    DG Chest Portable 1 View Result Date: 04/02/2024 CLINICAL  DATA:  Shortness of breath.  On home oxygen. EXAM: PORTABLE CHEST 1 VIEW COMPARISON:  Radiographs 06/22/2023 and 07/11/2022.  CT 11/30/2022. FINDINGS: 1354 hours. The heart size and mediastinal contours are grossly stable allowing for the portable semi erect nature of the current study. There is increased vascular congestion without overt pulmonary edema. No confluent airspace disease, pneumothorax or significant pleural effusion. Previous lower cervical fusion without evidence of acute osseous abnormality. Telemetry leads overlie the chest. IMPRESSION: Increased vascular congestion without overt pulmonary edema or focal airspace disease. Electronically Signed   By: Elsie Perone M.D.   On: 04/02/2024 15:30    Microbiology: Results for orders placed or performed during the hospital encounter of 04/02/24  Resp panel by RT-PCR (RSV, Flu A&B, Covid) Anterior Nasal Swab     Status: None   Collection Time: 04/02/24  1:07 PM   Specimen: Anterior Nasal Swab  Result Value Ref Range Status   SARS Coronavirus 2 by RT PCR NEGATIVE NEGATIVE Final    Comment: (NOTE) SARS-CoV-2 target nucleic acids are NOT DETECTED.  The SARS-CoV-2 RNA is generally detectable in upper respiratory specimens during the acute phase of infection. The lowest concentration of SARS-CoV-2 viral copies this assay can detect is 138 copies/mL. A negative result does not preclude SARS-Cov-2 infection and should not be used as the sole basis for treatment or other patient management decisions. A negative result may occur with  improper specimen collection/handling, submission of specimen other than nasopharyngeal swab, presence of viral mutation(s) within the areas targeted by this assay, and inadequate number of viral copies(<138 copies/mL). A negative result must be combined with clinical observations, patient history, and epidemiological information. The expected result is Negative.  Fact Sheet for Patients:   bloggercourse.com  Fact Sheet for Healthcare Providers:  seriousbroker.it  This test is no t yet approved or cleared by the [place]  FDA and  has been authorized for detection and/or diagnosis of SARS-CoV-2 by FDA under an Emergency Use Authorization (EUA). This EUA will remain  in effect (meaning this test can be used) for the duration of the COVID-19 declaration under Section 564(b)(1) of the Act, 21 U.S.C.section 360bbb-3(b)(1), unless the authorization is terminated  or revoked sooner.       Influenza A by PCR NEGATIVE NEGATIVE Final   Influenza B by PCR NEGATIVE NEGATIVE Final    Comment: (NOTE) The Xpert Xpress SARS-CoV-2/FLU/RSV plus assay is  intended as an aid in the diagnosis of influenza from Nasopharyngeal swab specimens and should not be used as a sole basis for treatment. Nasal washings and aspirates are unacceptable for Xpert Xpress SARS-CoV-2/FLU/RSV testing.  Fact Sheet for Patients: bloggercourse.com  Fact Sheet for Healthcare Providers: seriousbroker.it  This test is not yet approved or cleared by the [place]  FDA and has been authorized for detection and/or diagnosis of SARS-CoV-2 by FDA under an Emergency Use Authorization (EUA). This EUA will remain in effect (meaning this test can be used) for the duration of the COVID-19 declaration under Section 564(b)(1) of the Act, 21 U.S.C. section 360bbb-3(b)(1), unless the authorization is terminated or revoked.     Resp Syncytial Virus by PCR NEGATIVE NEGATIVE Final    Comment: (NOTE) Fact Sheet for Patients: bloggercourse.com  Fact Sheet for Healthcare Providers: seriousbroker.it  This test is not yet approved or cleared by the [place]  FDA and has been authorized for detection and/or diagnosis of SARS-CoV-2 by FDA under an Emergency Use  Authorization (EUA). This EUA will remain in effect (meaning this test can be used) for the duration of the COVID-19 declaration under Section 564(b)(1) of the Act, 21 U.S.C. section 360bbb-3(b)(1), unless the authorization is terminated or revoked.  Performed at [facility], [address] [place], [place] [address]     Labs: CBC: Recent Labs  Lab 04/02/24 1258  WBC 9.1  HGB 14.2  HCT 44.4  MCV 94.9  PLT 285   Basic Metabolic Panel: Recent Labs  Lab 04/02/24 1258 04/04/24 0357  NA 139 139  K 4.3 3.3*  CL 101 97*  CO2 30 33*  GLUCOSE 230* 189*  BUN 17 12  CREATININE 0.73 0.77  CALCIUM  9.7 9.9  MG  --  2.2   Liver Function Tests: Recent Labs  Lab 04/02/24 1258 04/04/24 0357  AST 14* 13*  ALT 10 11  ALKPHOS 92 77  BILITOT 0.3 0.4  PROT 7.2 6.9  ALBUMIN 4.3 4.1   CBG: Recent Labs  Lab 04/03/24 1204 04/03/24 1700 04/03/24 2125 04/04/24 0721 04/04/24 1132  GLUCAP 244* 137* 96 171* 132*    Discharge time spent: 38 minutes.   Signed: Elgie Butter, MD [facility] 04/04/2024

## 2024-04-04 NOTE — Progress Notes (Signed)
 Progress Note  Patient Name: Veronica Rivers Date of Encounter: 04/04/2024  Primary Cardiologist: [name], MD   Subjective   Patient seen and examined at her bedside. She offers no complaints at this time.   Inpatient Medications    Scheduled Meds:  amphetamine -dextroamphetamine   20 mg Oral BID   apixaban   5 mg Oral BID   buPROPion   450 mg Oral q AM   carvedilol   12.5 mg Oral BID WC   docusate sodium   100 mg Oral BID   DULoxetine   90 mg Oral QHS   famotidine   40 mg Oral QHS   furosemide   40 mg Intravenous Daily   insulin  aspart  0-15 Units Subcutaneous TID WC   insulin  aspart  0-5 Units Subcutaneous QHS   insulin  aspart  3 Units Subcutaneous TID WC   insulin  glargine-yfgn  25 Units Subcutaneous BID   lamoTRIgine   300 mg Oral QHS   levothyroxine   125 mcg Oral QAC breakfast   lumateperone  tosylate  42 mg Oral Daily   methocarbamol   500 mg Oral QHS   valbenazine   60 mg Oral Daily   Continuous Infusions:  PRN Meds: albuterol , oxyCODONE -acetaminophen , promethazine    Vital Signs    Vitals:   04/03/24 1326 04/03/24 1753 04/03/24 2000 04/04/24 0553  BP: (!) 158/87 (!) 142/87 (!) 146/80 137/85  Pulse: 81 92 94 68  Resp: 17  20 (!) 24  Temp: 98.2 F (36.8 C)  99.2 F (37.3 C) 98 F (36.7 C)  TempSrc:   Oral Oral  SpO2: 94%  94% 97%  Weight:      Height:        Intake/Output Summary (Last 24 hours) at 04/04/2024 0914 Last data filed at 04/04/2024 0800 Gross per 24 hour  Intake 1020 ml  Output 1400 ml  Net -380 ml   Filed Weights   04/02/24 1611  Weight: 118.1 kg    Telemetry    Afib rate controlled - Personally Reviewed  ECG     - Personally Reviewed  Physical Exam   General: Comfortable Head: Atraumatic, normal size  Eyes: PEERLA, EOMI  Neck: Supple, normal JVD Cardiac: Normal S1, S2; RRR; no murmurs, rubs, or gallops Lungs: Clear to auscultation bilaterally Abd: Soft, nontender, no hepatomegaly  Ext: warm, no edema Musculoskeletal: No  deformities, BUE and BLE strength normal and equal Skin: Warm and dry, no rashes   Neuro: Alert and oriented to person, place, time, and situation, CNII-XII grossly intact, no focal deficits  Psych: Normal mood and affect   Labs    Chemistry Recent Labs  Lab 04/02/24 1258 04/04/24 0357  NA 139 139  K 4.3 3.3*  CL 101 97*  CO2 30 33*  GLUCOSE 230* 189*  BUN 17 12  CREATININE 0.73 0.77  CALCIUM  9.7 9.9  PROT 7.2 6.9  ALBUMIN 4.3 4.1  AST 14* 13*  ALT 10 11  ALKPHOS 92 77  BILITOT 0.3 0.4  GFRNONAA >60 >60  ANIONGAP 9 10     Hematology Recent Labs  Lab 04/02/24 1258  WBC 9.1  RBC 4.68  HGB 14.2  HCT 44.4  MCV 94.9  MCH 30.3  MCHC 32.0  RDW 13.0  PLT 285    Cardiac EnzymesNo results for input(s): TROPONINI in the last 168 hours. No results for input(s): TROPIPOC in the last 168 hours.   BNP Recent Labs  Lab 04/02/24 1258  PROBNP 1,185.0*     DDimer No results for input(s): DDIMER in the  last 168 hours.   Radiology    ECHOCARDIOGRAM COMPLETE Result Date: 04/03/2024    ECHOCARDIOGRAM REPORT   Patient Name:   Veronica Rivers Date of Exam: 04/03/2024 Medical Rec #:  [id number]     Height:       69.0 in Accession #:    [id number]    Weight:       260.4 lb Date of Birth:  [birth year]     BSA:          2.311 m Patient Age:    58 years      BP:           151/81 mmHg Patient Gender: F             HR:           97 bpm. Exam Location:  Inpatient Procedure: 2D Echo, Cardiac Doppler, Color Doppler and Intracardiac            Opacification Agent (Both Spectral and Color Flow Doppler were            utilized during procedure). Indications:    Dyspnea  History:        Patient has prior history of Echocardiogram examinations, most                 recent 12/12/2022. Signs/Symptoms:Shortness of Breath and Dyspnea;                 Risk Factors:Diabetes, Dyslipidemia, Sleep Apnea and Family                 History of Coronary Artery Disease.  Sonographer:    Philomena Daring Referring  Phys: [id number] ABRAHAM FELIZ ORTIZ IMPRESSIONS  1. Left ventricular ejection fraction, by estimation, is 50 to 55%. The left ventricle has low normal function. The left ventricle has no regional wall motion abnormalities. There is mild left ventricular hypertrophy. Left ventricular diastolic parameters are indeterminate.  2. Right ventricular systolic function is normal. The right ventricular size is normal. Tricuspid regurgitation signal is inadequate for assessing PA pressure.  3. The mitral valve is normal in structure. Trivial mitral valve regurgitation. No evidence of mitral stenosis.  4. The aortic valve was not well visualized. Aortic valve regurgitation is not visualized. No aortic stenosis is present.  5. The inferior vena cava is dilated in size with <50% respiratory variability, suggesting right atrial pressure of 15 mmHg. FINDINGS  Left Ventricle: Left ventricular ejection fraction, by estimation, is 50 to 55%. The left ventricle has low normal function. The left ventricle has no regional wall motion abnormalities. Definity  contrast agent was given IV to delineate the left ventricular endocardial borders. The left ventricular internal cavity size was normal in size. There is mild left ventricular hypertrophy. Left ventricular diastolic parameters are indeterminate. Right Ventricle: The right ventricular size is normal. No increase in right ventricular wall thickness. Right ventricular systolic function is normal. Tricuspid regurgitation signal is inadequate for assessing PA pressure. Left Atrium: Left atrial size was normal in size. Right Atrium: Right atrial size was normal in size. Pericardium: There is no evidence of pericardial effusion. Mitral Valve: The mitral valve is normal in structure. Trivial mitral valve regurgitation. No evidence of mitral valve stenosis. Tricuspid Valve: The tricuspid valve is normal in structure. Tricuspid valve regurgitation is trivial. Aortic Valve: The aortic valve was not  well visualized. Aortic valve regurgitation is not visualized. No aortic stenosis is present. Pulmonic Valve: The pulmonic valve was not well visualized. Pulmonic  valve regurgitation is trivial. Aorta: The aortic root and ascending aorta are structurally normal, with no evidence of dilitation. Venous: The inferior vena cava is dilated in size with less than 50% respiratory variability, suggesting right atrial pressure of 15 mmHg. IAS/Shunts: The interatrial septum was not well visualized.  LEFT VENTRICLE PLAX 2D LVIDd:         4.80 cm   Diastology LVIDs:         3.50 cm   LV e' medial:    8.92 cm/s LV PW:         1.00 cm   LV E/e' medial:  10.3 LV IVS:        1.00 cm   LV e' lateral:   10.90 cm/s LVOT diam:     2.10 cm   LV E/e' lateral: 8.4 LV SV:         55 LV SV Index:   24 LVOT Area:     3.46 cm  RIGHT VENTRICLE             IVC RV S prime:     11.60 cm/s  IVC diam: 2.40 cm TAPSE (M-mode): 2.4 cm LEFT ATRIUM             Index        RIGHT ATRIUM           Index LA diam:        4.50 cm 1.95 cm/m   RA Area:     18.60 cm LA Vol (A2C):   69.0 ml 29.85 ml/m  RA Volume:   45.90 ml  19.86 ml/m LA Vol (A4C):   49.6 ml 21.46 ml/m LA Biplane Vol: 61.7 ml 26.70 ml/m  AORTIC VALVE LVOT Vmax:   74.20 cm/s LVOT Vmean:  56.500 cm/s LVOT VTI:    0.158 m  AORTA Ao Root diam: 2.50 cm Ao Asc diam:  2.80 cm MITRAL VALVE MV Area (PHT): 5.93 cm    SHUNTS MV Decel Time: 128 msec    Systemic VTI:  0.16 m MV E velocity: 91.90 cm/s  Systemic Diam: 2.10 cm MV A velocity: 31.20 cm/s MV E/A ratio:  2.95 Lonni Nanas MD Electronically signed by Lonni Nanas MD Signature Date/Time: 04/03/2024/1:10:50 PM    Final    DG Chest Portable 1 View Result Date: 04/02/2024 CLINICAL DATA:  Shortness of breath.  On home oxygen. EXAM: PORTABLE CHEST 1 VIEW COMPARISON:  Radiographs 06/22/2023 and 07/11/2022.  CT 11/30/2022. FINDINGS: 1354 hours. The heart size and mediastinal contours are grossly stable allowing for the portable  semi erect nature of the current study. There is increased vascular congestion without overt pulmonary edema. No confluent airspace disease, pneumothorax or significant pleural effusion. Previous lower cervical fusion without evidence of acute osseous abnormality. Telemetry leads overlie the chest. IMPRESSION: Increased vascular congestion without overt pulmonary edema or focal airspace disease. Electronically Signed   By: Elsie Perone M.D.   On: 04/02/2024 15:30    Cardiac Studies  echo  Patient Profile     58 y.o. female with new onset PAF   Assessment & Plan    New onset atrial fibrillation  Acute on Chronic diastolic heart failure Hypertensive heart disease with hear failure  COPD with chronic hypoxia respiratory failure on home oxygen  She is still in atrial fibrillation with controlled ventricular rate, will continue current dose of carvedilol  12.5 mg twice daily.  Continue her anticoagulation with Eliquis . As discussed with the patient yesterday it would be best  to move forward with 3 weeks of anticoagulation and cardioversion in outpatient especially given the concern of possible dysphagia.  It appears that her shortness of breath has improved significantly.  We can transition her to oral Lasix  she is on 20 mg daily at home.  She is on chronic oxygen at home as well.  From a cardiovascular standpoint she can be discharged to home.  Will set up a follow-up    For questions or updates, please contact [facility] Please consult [email or web address] for contact info under Cardiology/STEMI.      Signed, Jaydeen Darley, DO  04/04/2024, 9:14 AM

## 2024-04-08 ENCOUNTER — Encounter: Payer: Self-pay | Admitting: Cardiovascular Disease

## 2024-04-11 NOTE — Progress Notes (Signed)
 Date:  04/19/2024    Veronica Rivers Patient Date of Birth: 1966-02-09 Medical Record #[id number]  PCP:  Marvene Prentice SAUNDERS, FNP  Cardiologist:  Delford    HPI: Veronica Rivers is a 58 y.o. female  with a history of HLD, SVT, obesity, COPD and type 2 DM. No known CAD. Echo in 2019 with normal EF. In the hospital July 2020 with atypical chest pain - felt to be GI related. Has had costochondritis in the past also. Was to have stress testing - this was never completed - she cancelled along with her follow up here.   Her dyspnea is with and without exertion. Her chest pain comes and goes - with and without exertion - some palpable pain but also not. Says it is no different from her hospitalization back in July. She is now on insulin . Her weight continues to climb. She is a former smoker.     She sees Dr Jude for COPD 25 pack year quit in 1999 CT with apical  Emphysema  Rx with Breo did not tolerate. worse with seasonal allergies CTA 11/30/22 no PE patchy ground glass attenuation in upper lobes She wears oxygen at night On 2 L's oxygen at home   Going through some depression/anxiety Has a long term therapist and now on Buspar Been bad since October 2023  Husband and daughter helpful    Admitted 11/25-27,2025 for dyspnea and edema Seen by Dr Sheena in hospital rate control with coreg  12.5 mg bid and anticoagulated with eliquis . Sent home on lasix  20 mg daily TTE with EF 50-55% trivial MR AV sclerosis normal RV  Discussed issues of rate control vs rhythm control She was started on eliquis  04/02/24 3 weeks of anticoagulation will be 04/23/24. She has been compliant with eliquis  no missed doses    Past Medical History:  Diagnosis Date   Anxiety    Asthma    Bipolar depression (HCC)    Cellulitis    CHEST PAIN    Depression    DM    DYSPNEA    Emphysema of lung (HCC)    HYPERCHOLESTEROLEMIA    HYPERLIPIDEMIA    HYPOTHYROIDISM    Neuropathy    Sleep apnea    home oxygen   SUPRAVENTRICULAR  TACHYCARDIA     Past Surgical History:  Procedure Laterality Date   CESAREAN SECTION     COLONOSCOPY N/A 10/24/2020   Procedure: COLONOSCOPY;  Surgeon: Saintclair Jasper, MD;  Location: [facility];  Service: Gastroenterology;  Laterality: N/A;   FOOT SURGERY Left    HEMOSTASIS CLIP PLACEMENT  10/24/2020   Procedure: HEMOSTASIS CLIP PLACEMENT;  Surgeon: Saintclair Jasper, MD;  Location: [facility];  Service: Gastroenterology;;   LUMBAR LAMINECTOMY/DECOMPRESSION MICRODISCECTOMY Right 11/16/2023   Procedure: LUMBAR LAMINECTOMY/DECOMPRESSION MICRODISCECTOMY RIGHT LUMBAR TWO-THREE;  Surgeon: Mavis Purchase, MD;  Location: [facility] OR;  Service: Neurosurgery;  Laterality: Right;   NASAL SEPTUM SURGERY     NECK SURGERY     TONSILLECTOMY     TUBAL LIGATION       Medications: Current Meds  Medication Sig   albuterol  (VENTOLIN  HFA) 108 (90 Base) MCG/ACT inhaler INHALE ONE TO TWO PUFFS BY MOUTH EVERY 4 HOURS AS NEEDED FOR FOR SHORTNESS OF BREATH OR WHEEZING (Patient taking differently: Inhale 2 puffs into the lungs every 4 (four) hours as needed for wheezing or shortness of breath.)   amphetamine -dextroamphetamine  (ADDERALL) 20 MG tablet Take 20 mg by mouth See admin instructions. Take 20 mg by mouth in  the morning and afternoon   apixaban  (ELIQUIS ) 5 MG TABS tablet Take 1 tablet (5 mg total) by mouth 2 (two) times daily.   B Complex-Biotin-FA TABS Take 1 tablet by mouth daily.   budesonide -formoterol  (SYMBICORT ) 160-4.5 MCG/ACT inhaler Inhale 2 puffs into the lungs 2 (two) times daily.   buPROPion  (WELLBUTRIN  XL) 150 MG 24 hr tablet Take 450 mg by mouth in the morning.   CAPLYTA  42 MG capsule Take 42 mg by mouth at bedtime.   carvedilol  (COREG ) 12.5 MG tablet TAKE 1 TABLET BY MOUTH 2 TIMES A DAY WITH A MEAL (Patient taking differently: Take 12.5 mg by mouth See admin instructions. Take 12.5 mg by mouth in the morning and afternoon)   Cholecalciferol (VITAMIN D3) 125 MCG (5000 UT) CAPS Take 15,000 Units by  mouth in the morning.   Continuous Glucose Sensor (FREESTYLE LIBRE 3 PLUS SENSOR) MISC Inject 1 Device into the skin See admin instructions. Place 1 new sensor into the skin every 15 days   docusate sodium  (COLACE) 100 MG capsule Take 1 capsule (100 mg total) by mouth 2 (two) times daily. (Patient taking differently: Take 100 mg by mouth 2 (two) times daily as needed for mild constipation.)   DULoxetine  (CYMBALTA ) 30 MG capsule Take 90 mg by mouth at bedtime.   famotidine  (PEPCID ) 40 MG tablet Take 40 mg by mouth daily before breakfast.   furosemide  (LASIX ) 40 MG tablet Take 1 tablet (40 mg total) by mouth daily.   HUMALOG KWIKPEN 100 UNIT/ML KwikPen Inject 5 Units into the skin 3 (three) times daily as needed (for a BGL greater than 80, but less than 200).   hydrocortisone cream 0.5 % Apply 1 application  topically 2 (two) times daily as needed (skin irritation.).   INGREZZA  60 MG capsule Take 60 mg by mouth at bedtime.   lamoTRIgine  (LAMICTAL ) 150 MG tablet Take 300 mg by mouth at bedtime.   LANTUS  SOLOSTAR 100 UNIT/ML Solostar Pen Inject 60 Units into the skin in the morning.   levothyroxine  (SYNTHROID ) 125 MCG tablet Take 125 mcg by mouth daily before breakfast.   LORazepam  (ATIVAN ) 0.5 MG tablet Take 0.5 mg by mouth daily as needed.   methocarbamol  (ROBAXIN ) 500 MG tablet Take 500 mg by mouth at bedtime.   montelukast  (SINGULAIR ) 10 MG tablet TAKE 1 TABLET BY MOUTH AT BEDTIME   nitroGLYCERIN  (NITROSTAT ) 0.4 MG SL tablet Place 1 tablet (0.4 mg total) under the tongue every 5 (five) minutes as needed for chest pain.   NON FORMULARY Take 1 capsule by mouth See admin instructions. [place] Mushroom Cognition/Brain Supplement/Brain Vitamins/Focus Supplement capsules - Take 1 capsule by mouth once a day   NURTEC 75 MG TBDP Take 75 mg by mouth daily as needed (for migraines - dissolve orally).   nystatin  (MYCOSTATIN ) 100000 UNIT/ML suspension Take 5 mLs by mouth 2 (two) times daily as needed (for  mouth irritation).   Omega-3 Fatty Acids (FISH OIL PO) Take 1 capsule by mouth every evening.   ONE TOUCH ULTRA TEST test strip Inject 1 strip into the skin daily.   oxyCODONE -acetaminophen  (PERCOCET) 10-325 MG tablet Take 1 tablet by mouth 5 (five) times daily as needed for pain.   OXYGEN Inhale 2 L/min into the lungs continuous.   PROMETHEGAN 12.5 MG suppository Place 12.5 mg rectally 2 (two) times daily as needed for nausea or vomiting.     Allergies: Allergies  Allergen Reactions   Prilosec [Omeprazole ] Shortness Of Breath and Other (See Comments)  Made her breathing worse   Atorvastatin  Dermatitis   Canagliflozin Other (See Comments)    Yeast infections (Invokana)    Cyclobenzaprine Other (See Comments)    Crazy in the head and Dissociation   Dulaglutide Nausea And Vomiting and Other (See Comments)    TRULICITY   Empagliflozin Other (See Comments)    Yeast infections (Jardiance)   Ezetimibe  Other (See Comments)    Miscla epain   Metformin  Hcl Nausea And Vomiting   Mobic  [Meloxicam ] Other (See Comments)    Pt stated, Got Burning, Acid Reflux with medicine Pt should not be on any anti-inflammatories d/t elevated LFTs.   Ozempic (0.25 Or 0.5 Mg-Dose) [Semaglutide(0.25 Or 0.5mg -Dos)] Nausea And Vomiting   Pravastatin Dermatitis   Semaglutide Other (See Comments)    Stomach upset (Ozempic, Wegovy, and Rybelsus)   Tape    Valacyclovir  Other (See Comments)    Agitation    Simvastatin  Rash and Other (See Comments)    Caused muscle pain, too   Sulfa Antibiotics Rash and Dermatitis    Social History: The patient  reports that she quit smoking about 26 years ago. Her smoking use included cigarettes. She started smoking about 41 years ago. She has never used smokeless tobacco. She reports current drug use. Drug: Marijuana. She reports that she does not drink alcohol.   Family History: The patient's family history includes COPD in her mother; Cancer in her father;  Heart attack in her mother; Heart disease in her mother; Hypertension in her mother.   Review of Systems: Please see the history of present illness.   All other systems are reviewed and negative.   Physical Exam: VS:  BP 108/74   Pulse 95   Ht 5' 9 (1.753 m)   Wt 264 lb (119.7 kg)   LMP  (LMP Unknown) Comment: post-meno  SpO2 95%   BMI 38.99 kg/m  .  BMI Body mass index is 38.99 kg/m.  Wt Readings from Last 3 Encounters:  04/19/24 264 lb (119.7 kg)  04/02/24 260 lb 6.4 oz (118.1 kg)  11/16/23 271 lb 9.7 oz (123.2 kg)    Affect appropriate Healthy:  appears stated age HEENT: normal Neck supple with no adenopathy JVP normal no bruits no thyromegaly Lungs clear with no wheezing and good diaphragmatic motion Heart:  S1/S2 no murmur, no rub, gallop or click PMI normal Abdomen: benighn, BS positve, no tenderness, no AAA no bruit.  No HSM or HJR Distal pulses intact with no bruits No edema Neuro non-focal Skin warm and dry No muscular weakness   LABORATORY DATA:  EKG:    04/19/2024 SR rate 65 nonspecific ST changes    Lab Results  Component Value Date   WBC 9.1 04/02/2024   HGB 14.2 04/02/2024   HCT 44.4 04/02/2024   PLT 285 04/02/2024   GLUCOSE 189 (H) 04/04/2024   CHOL 224 (H) 12/19/2018   TRIG 112 12/19/2018   HDL 65 12/19/2018   LDLCALC 137 (H) 12/19/2018   ALT 11 04/04/2024   AST 13 (L) 04/04/2024   NA 139 04/04/2024   K 3.3 (L) 04/04/2024   CL 97 (L) 04/04/2024   CREATININE 0.77 04/04/2024   BUN 12 04/04/2024   CO2 33 (H) 04/04/2024   TSH 2.360 04/04/2024   HGBA1C 7.4 (H) 04/02/2024    Other Studies Reviewed Today:  Myovue: 03/15/19  Study Highlights    There was no ST segment deviation noted during stress. Nuclear stress EF: 63%. The left ventricular ejection fraction is  normal (55-65%). The study is normal. This is a low risk study.   Fixed apical inferolateral defect with normal wall motion in that region, suggestive of  artifact   Echocardiogram  04/19/2024   EF 65-70% mild MR   Assessment/Plan:  1. Chest pain - multiple risk factors atypical normal myovue 03/05/19 observe  3. Swelling - dependant edema from weight October 2020 CT negative for PE TTE 04/03/24  EF 50-55% trivial MR normal RV and mild LVH with indeterminate diastolic parameters. Recent CHF exacerbation due to afib. Continue lasix    4. GERD - f/u with [facility] dilated CBD US  MRI normal 03/07/20   5. DM - per PCP - now on insulin    6.  HLD - on statin  7. COPD/asthma/emphysema - prior smoker. F/u Dr Jude abnormal gound glass in upper lobes on CTA 11/30/22 no PE Using oxygen at night  Given worsening dyspnea will update TTE PFT;s done 11/14/22 mild restriction and normal diffusion capacity  8.  Anxiety/Depression:  worse now on Buspar f/u [facility]   9.  Afib:  new diagnosis during hospitalization  04/02/24 rate control with coreg  Been on eliquis  since 04/02/24 Cardioversion arrange for next week Risks including stroke TMP and intubation discussed willing to proceed  CBC/BMET BNP Orders written  [facility] scheduled for next Tuesday with me   Disposition:   F/U inin 2 weeks post [facility]  Time:  Spent reviewing chart myovue,echo  pulmonary notes direct patient interview and composing note 20 minutes   Signed: [name], MD  04/19/2024 11:23 AM  [facility] [address] [place], [place]  [address] Phone: [phone] Fax: [phone]

## 2024-04-11 NOTE — H&P (View-Only) (Signed)
 Date:  04/19/2024    Veronica Rivers Patient Date of Birth: 1966-02-09 Medical Record #[id number]  PCP:  Marvene Prentice SAUNDERS, FNP  Cardiologist:  Delford    HPI: Veronica Rivers is a 58 y.o. female  with a history of HLD, SVT, obesity, COPD and type 2 DM. No known CAD. Echo in 2019 with normal EF. In the hospital July 2020 with atypical chest pain - felt to be GI related. Has had costochondritis in the past also. Was to have stress testing - this was never completed - she cancelled along with her follow up here.   Her dyspnea is with and without exertion. Her chest pain comes and goes - with and without exertion - some palpable pain but also not. Says it is no different from her hospitalization back in July. She is now on insulin . Her weight continues to climb. She is a former smoker.     She sees Dr Jude for COPD 25 pack year quit in 1999 CT with apical  Emphysema  Rx with Breo did not tolerate. worse with seasonal allergies CTA 11/30/22 no PE patchy ground glass attenuation in upper lobes She wears oxygen at night On 2 L's oxygen at home   Going through some depression/anxiety Has a long term therapist and now on Buspar Been bad since October 2023  Husband and daughter helpful    Admitted 11/25-27,2025 for dyspnea and edema Seen by Dr Sheena in hospital rate control with coreg  12.5 mg bid and anticoagulated with eliquis . Sent home on lasix  20 mg daily TTE with EF 50-55% trivial MR AV sclerosis normal RV  Discussed issues of rate control vs rhythm control She was started on eliquis  04/02/24 3 weeks of anticoagulation will be 04/23/24. She has been compliant with eliquis  no missed doses    Past Medical History:  Diagnosis Date   Anxiety    Asthma    Bipolar depression (HCC)    Cellulitis    CHEST PAIN    Depression    DM    DYSPNEA    Emphysema of lung (HCC)    HYPERCHOLESTEROLEMIA    HYPERLIPIDEMIA    HYPOTHYROIDISM    Neuropathy    Sleep apnea    home oxygen   SUPRAVENTRICULAR  TACHYCARDIA     Past Surgical History:  Procedure Laterality Date   CESAREAN SECTION     COLONOSCOPY N/A 10/24/2020   Procedure: COLONOSCOPY;  Surgeon: Saintclair Jasper, MD;  Location: [facility];  Service: Gastroenterology;  Laterality: N/A;   FOOT SURGERY Left    HEMOSTASIS CLIP PLACEMENT  10/24/2020   Procedure: HEMOSTASIS CLIP PLACEMENT;  Surgeon: Saintclair Jasper, MD;  Location: [facility];  Service: Gastroenterology;;   LUMBAR LAMINECTOMY/DECOMPRESSION MICRODISCECTOMY Right 11/16/2023   Procedure: LUMBAR LAMINECTOMY/DECOMPRESSION MICRODISCECTOMY RIGHT LUMBAR TWO-THREE;  Surgeon: Mavis Purchase, MD;  Location: [facility] OR;  Service: Neurosurgery;  Laterality: Right;   NASAL SEPTUM SURGERY     NECK SURGERY     TONSILLECTOMY     TUBAL LIGATION       Medications: Current Meds  Medication Sig   albuterol  (VENTOLIN  HFA) 108 (90 Base) MCG/ACT inhaler INHALE ONE TO TWO PUFFS BY MOUTH EVERY 4 HOURS AS NEEDED FOR FOR SHORTNESS OF BREATH OR WHEEZING (Patient taking differently: Inhale 2 puffs into the lungs every 4 (four) hours as needed for wheezing or shortness of breath.)   amphetamine -dextroamphetamine  (ADDERALL) 20 MG tablet Take 20 mg by mouth See admin instructions. Take 20 mg by mouth in  the morning and afternoon   apixaban  (ELIQUIS ) 5 MG TABS tablet Take 1 tablet (5 mg total) by mouth 2 (two) times daily.   B Complex-Biotin-FA TABS Take 1 tablet by mouth daily.   budesonide -formoterol  (SYMBICORT ) 160-4.5 MCG/ACT inhaler Inhale 2 puffs into the lungs 2 (two) times daily.   buPROPion  (WELLBUTRIN  XL) 150 MG 24 hr tablet Take 450 mg by mouth in the morning.   CAPLYTA  42 MG capsule Take 42 mg by mouth at bedtime.   carvedilol  (COREG ) 12.5 MG tablet TAKE 1 TABLET BY MOUTH 2 TIMES A DAY WITH A MEAL (Patient taking differently: Take 12.5 mg by mouth See admin instructions. Take 12.5 mg by mouth in the morning and afternoon)   Cholecalciferol (VITAMIN D3) 125 MCG (5000 UT) CAPS Take 15,000 Units by  mouth in the morning.   Continuous Glucose Sensor (FREESTYLE LIBRE 3 PLUS SENSOR) MISC Inject 1 Device into the skin See admin instructions. Place 1 new sensor into the skin every 15 days   docusate sodium  (COLACE) 100 MG capsule Take 1 capsule (100 mg total) by mouth 2 (two) times daily. (Patient taking differently: Take 100 mg by mouth 2 (two) times daily as needed for mild constipation.)   DULoxetine  (CYMBALTA ) 30 MG capsule Take 90 mg by mouth at bedtime.   famotidine  (PEPCID ) 40 MG tablet Take 40 mg by mouth daily before breakfast.   furosemide  (LASIX ) 40 MG tablet Take 1 tablet (40 mg total) by mouth daily.   HUMALOG KWIKPEN 100 UNIT/ML KwikPen Inject 5 Units into the skin 3 (three) times daily as needed (for a BGL greater than 80, but less than 200).   hydrocortisone cream 0.5 % Apply 1 application  topically 2 (two) times daily as needed (skin irritation.).   INGREZZA  60 MG capsule Take 60 mg by mouth at bedtime.   lamoTRIgine  (LAMICTAL ) 150 MG tablet Take 300 mg by mouth at bedtime.   LANTUS  SOLOSTAR 100 UNIT/ML Solostar Pen Inject 60 Units into the skin in the morning.   levothyroxine  (SYNTHROID ) 125 MCG tablet Take 125 mcg by mouth daily before breakfast.   LORazepam  (ATIVAN ) 0.5 MG tablet Take 0.5 mg by mouth daily as needed.   methocarbamol  (ROBAXIN ) 500 MG tablet Take 500 mg by mouth at bedtime.   montelukast  (SINGULAIR ) 10 MG tablet TAKE 1 TABLET BY MOUTH AT BEDTIME   nitroGLYCERIN  (NITROSTAT ) 0.4 MG SL tablet Place 1 tablet (0.4 mg total) under the tongue every 5 (five) minutes as needed for chest pain.   NON FORMULARY Take 1 capsule by mouth See admin instructions. Lion's [place]ane Mushroom Cognition/Brain Supplement/Brain Vitamins/Focus Supplement capsules - Take 1 capsule by mouth once a day   NURTEC 75 MG TBDP Take 75 mg by mouth daily as needed (for migraines - dissolve orally).   nystatin  (MYCOSTATIN ) 100000 UNIT/ML suspension Take 5 mLs by mouth 2 (two) times daily as needed (for  mouth irritation).   Omega-3 Fatty Acids (FISH OIL PO) Take 1 capsule by mouth every evening.   ONE TOUCH ULTRA TEST test strip Inject 1 strip into the skin daily.   oxyCODONE -acetaminophen  (PERCOCET) 10-325 MG tablet Take 1 tablet by mouth 5 (five) times daily as needed for pain.   OXYGEN Inhale 2 L/min into the lungs continuous.   PROMETHEGAN 12.5 MG suppository Place 12.5 mg rectally 2 (two) times daily as needed for nausea or vomiting.     Allergies: Allergies  Allergen Reactions   Prilosec [Omeprazole ] Shortness Of Breath and Other (See Comments)  Made her breathing worse   Atorvastatin  Dermatitis   Canagliflozin Other (See Comments)    Yeast infections (Invokana)    Cyclobenzaprine Other (See Comments)    Crazy in the head and Dissociation   Dulaglutide Nausea And Vomiting and Other (See Comments)    TRULICITY   Empagliflozin Other (See Comments)    Yeast infections (Jardiance)   Ezetimibe  Other (See Comments)    Miscla epain   Metformin  Hcl Nausea And Vomiting   Mobic  [Meloxicam ] Other (See Comments)    Pt stated, Got Burning, Acid Reflux with medicine Pt should not be on any anti-inflammatories d/t elevated LFTs.   Ozempic (0.25 Or 0.5 Mg-Dose) [Semaglutide(0.25 Or 0.5mg -Dos)] Nausea And Vomiting   Pravastatin Dermatitis   Semaglutide Other (See Comments)    Stomach upset (Ozempic, Wegovy, and Rybelsus)   Tape    Valacyclovir  Other (See Comments)    Agitation    Simvastatin  Rash and Other (See Comments)    Caused muscle pain, too   Sulfa Antibiotics Rash and Dermatitis    Social History: The patient  reports that she quit smoking about 26 years ago. Her smoking use included cigarettes. She started smoking about 41 years ago. She has never used smokeless tobacco. She reports current drug use. Drug: Marijuana. She reports that she does not drink alcohol.   Family History: The patient's family history includes COPD in her mother; Cancer in her father;  Heart attack in her mother; Heart disease in her mother; Hypertension in her mother.   Review of Systems: Please see the history of present illness.   All other systems are reviewed and negative.   Physical Exam: VS:  BP 108/74   Pulse 95   Ht 5' 9 (1.753 m)   Wt 264 lb (119.7 kg)   LMP  (LMP Unknown) Comment: post-meno  SpO2 95%   BMI 38.99 kg/m  .  BMI Body mass index is 38.99 kg/m.  Wt Readings from Last 3 Encounters:  04/19/24 264 lb (119.7 kg)  04/02/24 260 lb 6.4 oz (118.1 kg)  11/16/23 271 lb 9.7 oz (123.2 kg)    Affect appropriate Healthy:  appears stated age HEENT: normal Neck supple with no adenopathy JVP normal no bruits no thyromegaly Lungs clear with no wheezing and good diaphragmatic motion Heart:  S1/S2 no murmur, no rub, gallop or click PMI normal Abdomen: benighn, BS positve, no tenderness, no AAA no bruit.  No HSM or HJR Distal pulses intact with no bruits No edema Neuro non-focal Skin warm and dry No muscular weakness   LABORATORY DATA:  EKG:    04/19/2024 SR rate 65 nonspecific ST changes    Lab Results  Component Value Date   WBC 9.1 04/02/2024   HGB 14.2 04/02/2024   HCT 44.4 04/02/2024   PLT 285 04/02/2024   GLUCOSE 189 (H) 04/04/2024   CHOL 224 (H) 12/19/2018   TRIG 112 12/19/2018   HDL 65 12/19/2018   LDLCALC 137 (H) 12/19/2018   ALT 11 04/04/2024   AST 13 (L) 04/04/2024   NA 139 04/04/2024   K 3.3 (L) 04/04/2024   CL 97 (L) 04/04/2024   CREATININE 0.77 04/04/2024   BUN 12 04/04/2024   CO2 33 (H) 04/04/2024   TSH 2.360 04/04/2024   HGBA1C 7.4 (H) 04/02/2024    Other Studies Reviewed Today:  Myovue: 03/15/19  Study Highlights    There was no ST segment deviation noted during stress. Nuclear stress EF: 63%. The left ventricular ejection fraction is  normal (55-65%). The study is normal. This is a low risk study.   Fixed apical inferolateral defect with normal wall motion in that region, suggestive of  artifact   Echocardiogram  04/19/2024   EF 65-70% mild MR   Assessment/Plan:  1. Chest pain - multiple risk factors atypical normal myovue 03/05/19 observe  3. Swelling - dependant edema from weight October 2020 CT negative for PE TTE 04/03/24  EF 50-55% trivial MR normal RV and mild LVH with indeterminate diastolic parameters. Recent CHF exacerbation due to afib. Continue lasix    4. GERD - f/u with [facility] dilated CBD US  MRI normal 03/07/20   5. DM - per PCP - now on insulin    6.  HLD - on statin  7. COPD/asthma/emphysema - prior smoker. F/u Dr Jude abnormal gound glass in upper lobes on CTA 11/30/22 no PE Using oxygen at night  Given worsening dyspnea will update TTE PFT;s done 11/14/22 mild restriction and normal diffusion capacity  8.  Anxiety/Depression:  worse now on Buspar f/u [facility]   9.  Afib:  new diagnosis during hospitalization  04/02/24 rate control with coreg  Been on eliquis  since 04/02/24 Cardioversion arrange for next week Risks including stroke TMP and intubation discussed willing to proceed  CBC/BMET BNP Orders written  [facility] scheduled for next Tuesday with me   Disposition:   F/U inin 2 weeks post [facility]  Time:  Spent reviewing chart myovue,echo  pulmonary notes direct patient interview and composing note 20 minutes   Signed: [name], MD  04/19/2024 11:23 AM  [facility] [address] [place], [place]  [address] Phone: [phone] Fax: [phone]

## 2024-04-17 ENCOUNTER — Other Ambulatory Visit: Payer: Self-pay | Admitting: Cardiovascular Disease

## 2024-04-19 ENCOUNTER — Ambulatory Visit: Attending: Cardiovascular Disease | Admitting: Cardiovascular Disease

## 2024-04-19 ENCOUNTER — Encounter: Payer: Self-pay | Admitting: Cardiovascular Disease

## 2024-04-19 VITALS — BP 108/74 | HR 95 | Ht 69.0 in | Wt 264.0 lb

## 2024-04-19 DIAGNOSIS — I10 Essential (primary) hypertension: Secondary | ICD-10-CM | POA: Diagnosis not present

## 2024-04-19 DIAGNOSIS — I4891 Unspecified atrial fibrillation: Secondary | ICD-10-CM | POA: Diagnosis not present

## 2024-04-19 DIAGNOSIS — R079 Chest pain, unspecified: Secondary | ICD-10-CM

## 2024-04-19 NOTE — Patient Instructions (Addendum)
 Medication Instructions:  Your physician recommends that you continue on your current medications as directed. Please refer to the Current Medication list given to you today.  *If you need a refill on your cardiac medications before your next appointment, please call your pharmacy*  Lab Work: BNP, Bmet, CBC  If you have labs (blood work) drawn today and your tests are completely normal, you will receive your results only by: MyChart Message (if you have MyChart) OR A paper copy in the mail If you have any lab test that is abnormal or we need to change your treatment, we will call you to review the results.  Testing/Procedures:     Dear Veronica Rivers Patient  You are scheduled for a Cardioversion on Tuesday, December 16 with Dr. Delford.  Please arrive at the [facility] (Main Entrance A) at [facility]: [address] [place], [place] [address] at 8:30 AM (This time is 1.5 hour(s) before your procedure to ensure your preparation).   Free valet parking service is available. You will check in at ADMITTING.   *Please Note: You will receive a call the day before your procedure to confirm the appointment time. That time may have changed from the original time based on the schedule for that day.*   DIET:  Nothing to eat or drink after midnight except a sip of water with medications (see medication instructions below)  MEDICATION INSTRUCTIONS:       :1}Continue taking your anticoagulant (blood thinner): Apixaban  (Eliquis ).  You will need to continue this after your procedure until you are told by your provider that it is safe to stop.    LABS: cbc, bmet, bnp  FYI:  For your safety, and to allow us  to monitor your vital signs accurately during the surgery/procedure we request: If you have artificial nails, gel coating, SNS etc, please have those removed prior to your surgery/procedure. Not having the nail coverings /polish removed may result in cancellation or delay of your  surgery/procedure.  Your support person will be asked to wait in the waiting room during your procedure.  It is OK to have someone drop you off and come back when you are ready to be discharged.  You cannot drive after the procedure and will need someone to drive you home.  Bring your insurance cards.  *Special Note: Every effort is made to have your procedure done on time. Occasionally there are emergencies that occur at the hospital that may cause delays. Please be patient if a delay does occur.     Follow-Up: At [facility], you and your health needs are our priority.  As part of our continuing mission to provide you with exceptional heart care, our providers are all part of one team.  This team includes your primary Cardiologist (physician) and Advanced Practice Providers or APPs (Physician Assistants and Nurse Practitioners) who all work together to provide you with the care you need, when you need it.  Your next appointment: you have appt scheduled Provider:   Dr. Delford on 12/30  We recommend signing up for the patient portal called MyChart.  Sign up information is provided on this After Visit Summary.  MyChart is used to connect with patients for Virtual Visits (Telemedicine).  Patients are able to view lab/test results, encounter notes, upcoming appointments, etc.  Non-urgent messages can be sent to your provider as well.   To learn more about what you can do with MyChart, go to [email or web address].   Other Instructions none

## 2024-04-20 LAB — BASIC METABOLIC PANEL WITH GFR
BUN/Creatinine Ratio: 23 (ref 9–23)
BUN: 21 mg/dL (ref 6–24)
CO2: 30 mmol/L — ABNORMAL HIGH (ref 20–29)
Calcium: 10 mg/dL (ref 8.7–10.2)
Chloride: 93 mmol/L — ABNORMAL LOW (ref 96–106)
Creatinine, Ser: 0.9 mg/dL (ref 0.57–1.00)
Glucose: 196 mg/dL — ABNORMAL HIGH (ref 70–99)
Potassium: 4.4 mmol/L (ref 3.5–5.2)
Sodium: 140 mmol/L (ref 134–144)
eGFR: 74 mL/min/1.73 (ref >59)

## 2024-04-20 LAB — CBC
Hematocrit: 44.2 % (ref 34.0–46.6)
Hemoglobin: 14.1 g/dL (ref 11.1–15.9)
MCH: 30.2 pg (ref 26.6–33.0)
MCHC: 31.9 g/dL (ref 31.5–35.7)
MCV: 95 fL (ref 79–97)
Platelets: 258 x10E3/uL (ref 150–450)
RBC: 4.67 x10E6/uL (ref 3.77–5.28)
RDW: 12.6 % (ref 11.7–15.4)
WBC: 8.5 x10E3/uL (ref 3.4–10.8)

## 2024-04-20 LAB — BRAIN NATRIURETIC PEPTIDE: BNP: 77.7 pg/mL (ref 0.0–100.0)

## 2024-04-22 NOTE — Anesthesia Preprocedure Evaluation (Signed)
 Anesthesia Evaluation  Patient identified by MRN, date of birth, ID band Patient awake    Reviewed: Allergy & Precautions, NPO status , Patient's Chart, lab work & pertinent test results  History of Anesthesia Complications Negative for: history of anesthetic complications  Airway Mallampati: III  TM Distance: >3 FB Neck ROM: Full   Comment: Previous grade II view with MAC 3, easy mask Dental   Pulmonary asthma , sleep apnea (2L O2) and Oxygen sleep apnea , COPD (2L O2 24/7),  COPD inhaler and oxygen dependent, former smoker   Pulmonary exam normal breath sounds clear to auscultation       Cardiovascular hypertension (carvedilol ), Pt. on home beta blockers + angina  (-) Past MI, (-) Cardiac Stents and (-) CABG + dysrhythmias Atrial Fibrillation and Supra Ventricular Tachycardia  Rhythm:Irregular Rate:Normal  HLD  TTE 04/03/2024: IMPRESSIONS    1. Left ventricular ejection fraction, by estimation, is 50 to 55%. The  left ventricle has low normal function. The left ventricle has no regional  wall motion abnormalities. There is mild left ventricular hypertrophy.  Left ventricular diastolic  parameters are indeterminate.   2. Right ventricular systolic function is normal. The right ventricular  size is normal. Tricuspid regurgitation signal is inadequate for assessing  PA pressure.   3. The mitral valve is normal in structure. Trivial mitral valve  regurgitation. No evidence of mitral stenosis.   4. The aortic valve was not well visualized. Aortic valve regurgitation  is not visualized. No aortic stenosis is present.   5. The inferior vena cava is dilated in size with <50% respiratory  variability, suggesting right atrial pressure of 15 mmHg.   Normal stress test 03/05/2019   Neuro/Psych neg Seizures PSYCHIATRIC DISORDERS Anxiety Depression Bipolar Disorder    Neuromuscular disease (neuropathy)    GI/Hepatic ,GERD   Medicated,,(+)     substance abuse  marijuana useFatty liver   Endo/Other  diabetes, Type 2, Insulin  DependentHypothyroidism  Non-toxic goiter  Renal/GU negative Renal ROS     Musculoskeletal  (+) Arthritis ,    Abdominal  (+) + obese  Peds  Hematology negative hematology ROS (+) Lab Results      Component                Value               Date                      WBC                      8.5                 04/19/2024                HGB                      14.1                04/19/2024                HCT                      44.2                04/19/2024                MCV  95                  04/19/2024                PLT                      258                 04/19/2024              Anesthesia Other Findings Last Eliquis : this morning  Reproductive/Obstetrics                              Anesthesia Physical Anesthesia Plan  ASA: 3  Anesthesia Plan: General   Post-op Pain Management: Minimal or no pain anticipated   Induction: Intravenous  PONV Risk Score and Plan: 3 and Treatment may vary due to age or medical condition  Airway Management Planned: Natural Airway and Nasal Cannula  Additional Equipment:   Intra-op Plan:   Post-operative Plan:   Informed Consent: I have reviewed the patients History and Physical, chart, labs and discussed the procedure including the risks, benefits and alternatives for the proposed anesthesia with the patient or authorized representative who has indicated his/her understanding and acceptance.       Plan Discussed with: CRNA and Anesthesiologist  Anesthesia Plan Comments: (Risks of general anesthesia discussed including, but not limited to, sore throat, hoarse voice, chipped/damaged teeth, injury to vocal cords, nausea and vomiting, allergic reactions, lung infection, heart attack, stroke, and death. All questions answered. )         Anesthesia Quick  Evaluation

## 2024-04-22 NOTE — Progress Notes (Signed)
 Pt called for pre procedure instructions.  Message left on ID voicemail.  Enc. to call for questions. Arrival time 0845 NPO after midnight explained Instructed to take am meds with sip of water and confirmed blood thinner consistency Instructed pt need for ride home tomorrow and have responsible adult with them for 24 hrs post procedure.

## 2024-04-23 ENCOUNTER — Other Ambulatory Visit: Payer: Self-pay

## 2024-04-23 ENCOUNTER — Encounter (HOSPITAL_COMMUNITY): Payer: Self-pay | Admitting: Cardiovascular Disease

## 2024-04-23 ENCOUNTER — Encounter (HOSPITAL_COMMUNITY): Admission: RE | Disposition: A | Payer: Self-pay | Attending: Cardiovascular Disease

## 2024-04-23 ENCOUNTER — Ambulatory Visit (HOSPITAL_COMMUNITY): Admitting: Anesthesiology

## 2024-04-23 ENCOUNTER — Ambulatory Visit (HOSPITAL_BASED_OUTPATIENT_CLINIC_OR_DEPARTMENT_OTHER): Admitting: Anesthesiology

## 2024-04-23 ENCOUNTER — Ambulatory Visit (HOSPITAL_COMMUNITY)
Admission: RE | Admit: 2024-04-23 | Discharge: 2024-04-23 | Disposition: A | Discharge status: Home / Self Care | Attending: Cardiovascular Disease | Admitting: Cardiovascular Disease

## 2024-04-23 DIAGNOSIS — F1290 Cannabis use, unspecified, uncomplicated: Secondary | ICD-10-CM | POA: Insufficient documentation

## 2024-04-23 DIAGNOSIS — E785 Hyperlipidemia, unspecified: Secondary | ICD-10-CM | POA: Insufficient documentation

## 2024-04-23 DIAGNOSIS — J4489 Other specified chronic obstructive pulmonary disease: Secondary | ICD-10-CM | POA: Diagnosis not present

## 2024-04-23 DIAGNOSIS — Z79899 Other long term (current) drug therapy: Secondary | ICD-10-CM | POA: Insufficient documentation

## 2024-04-23 DIAGNOSIS — Z7901 Long term (current) use of anticoagulants: Secondary | ICD-10-CM | POA: Insufficient documentation

## 2024-04-23 DIAGNOSIS — J45909 Unspecified asthma, uncomplicated: Secondary | ICD-10-CM

## 2024-04-23 DIAGNOSIS — Z87891 Personal history of nicotine dependence: Secondary | ICD-10-CM | POA: Insufficient documentation

## 2024-04-23 DIAGNOSIS — K219 Gastro-esophageal reflux disease without esophagitis: Secondary | ICD-10-CM | POA: Diagnosis not present

## 2024-04-23 DIAGNOSIS — Z794 Long term (current) use of insulin: Secondary | ICD-10-CM | POA: Insufficient documentation

## 2024-04-23 DIAGNOSIS — Z9981 Dependence on supplemental oxygen: Secondary | ICD-10-CM | POA: Insufficient documentation

## 2024-04-23 DIAGNOSIS — F32A Depression, unspecified: Secondary | ICD-10-CM | POA: Diagnosis not present

## 2024-04-23 DIAGNOSIS — E039 Hypothyroidism, unspecified: Secondary | ICD-10-CM | POA: Insufficient documentation

## 2024-04-23 DIAGNOSIS — I4891 Unspecified atrial fibrillation: Secondary | ICD-10-CM | POA: Insufficient documentation

## 2024-04-23 DIAGNOSIS — E1140 Type 2 diabetes mellitus with diabetic neuropathy, unspecified: Secondary | ICD-10-CM | POA: Insufficient documentation

## 2024-04-23 DIAGNOSIS — G4730 Sleep apnea, unspecified: Secondary | ICD-10-CM | POA: Insufficient documentation

## 2024-04-23 DIAGNOSIS — I10 Essential (primary) hypertension: Secondary | ICD-10-CM

## 2024-04-23 DIAGNOSIS — F419 Anxiety disorder, unspecified: Secondary | ICD-10-CM | POA: Insufficient documentation

## 2024-04-23 DIAGNOSIS — Z006 Encounter for examination for normal comparison and control in clinical research program: Secondary | ICD-10-CM

## 2024-04-23 DIAGNOSIS — Z79890 Hormone replacement therapy (postmenopausal): Secondary | ICD-10-CM | POA: Diagnosis not present

## 2024-04-23 DIAGNOSIS — J439 Emphysema, unspecified: Secondary | ICD-10-CM | POA: Insufficient documentation

## 2024-04-23 HISTORY — PX: CARDIOVERSION: EP1203

## 2024-04-23 SURGERY — CARDIOVERSION (CATH LAB)
Anesthesia: General

## 2024-04-23 MED ORDER — SODIUM CHLORIDE 0.9% FLUSH: 3.0000 mL | Freq: Two times a day (BID) | INTRAVENOUS | Status: DC

## 2024-04-23 MED ORDER — SODIUM CHLORIDE 0.9 % IV SOLN: 250.0000 mL | INTRAVENOUS | Status: DC | PRN

## 2024-04-23 MED ORDER — SODIUM CHLORIDE 0.9% FLUSH: 3.0000 mL | INTRAVENOUS | Status: DC | PRN

## 2024-04-23 MED ORDER — LIDOCAINE 2% (20 MG/ML) 5 ML SYRINGE
INTRAMUSCULAR | Status: DC | PRN
  Administered 2024-04-23: 09:00:00 50 mg via INTRAVENOUS

## 2024-04-23 MED ORDER — PROPOFOL 10 MG/ML IV BOLUS
INTRAVENOUS | Status: DC | PRN
  Administered 2024-04-23: 09:00:00 60 mg via INTRAVENOUS

## 2024-04-23 SURGICAL SUPPLY — 1 items: PAD DEFIB RADIO PHYSIO CONN (PAD) ×1 IMPLANT

## 2024-04-23 NOTE — Interval H&P Note (Signed)
 History and Physical Interval Note:  04/23/2024 9:09 AM  Veronica Rivers Patient  has presented today for surgery, with the diagnosis of AFIB.  The various methods of treatment have been discussed with the patient and family. After consideration of risks, benefits and other options for treatment, the patient has consented to  Procedures: CARDIOVERSION (N/A) as a surgical intervention.  The patient's history has been reviewed, patient examined, no change in status, stable for surgery.  I have reviewed the patient's chart and labs.  Questions were answered to the patient's satisfaction.     [name]

## 2024-04-23 NOTE — CV Procedure (Signed)
 DCC: Anesthesia: Propofol  Rx Eliquis  no missed doses  DCC x 1 200 J  Converted from afib rate 111 to NSR rate 75 bpm  No immediate neurologic sequelae  [name] MD [facility]

## 2024-04-23 NOTE — Transfer of Care (Signed)
 Immediate Anesthesia Transfer of Care Note  Patient: Veronica Rivers  Procedure(s) Performed: CARDIOVERSION  Patient Location: Cath Lab  Anesthesia Type:General  Level of Consciousness: drowsy and responds to stimulation  Airway & Oxygen Therapy: Patient Spontanous Breathing and Patient connected to nasal cannula oxygen  Post-op Assessment: Report given to RN and Post -op Vital signs reviewed and stable  Post vital signs: Reviewed and stable  Last Vitals:  Vitals Value Taken Time  BP 99/65 (76)   Temp    Pulse 58   Resp 15   SpO2 90     Last Pain:  Vitals:   04/23/24 0925  TempSrc:   PainSc: 0-No pain         Complications: No notable events documented.

## 2024-04-23 NOTE — Research (Signed)
 [facility] Cardioversion Informed Consent   Subject Name: Veronica Rivers  Subject met inclusion and exclusion criteria.  The informed consent form, study requirements and expectations were reviewed with the subject and questions and concerns were addressed prior to the signing of the consent form.  The subject verbalized understanding of the trial requirements.  The subject agreed to participate in the [facility] Cardioversion trial and signed the informed consent at 0910 on 16/Dec/2025.  The informed consent was obtained prior to performance of any protocol-specific procedures for the subject.  A copy of the signed informed consent was given to the subject and a copy was placed in the subject's medical record.   [name] Deb Loudin

## 2024-04-23 NOTE — Anesthesia Postprocedure Evaluation (Signed)
 Anesthesia Post Note  Patient: Veronica Rivers  Procedure(s) Performed: CARDIOVERSION     Patient location during evaluation: PACU Anesthesia Type: General Level of consciousness: awake Pain management: pain level controlled Vital Signs Assessment: post-procedure vital signs reviewed and stable Respiratory status: spontaneous breathing, nonlabored ventilation and respiratory function stable Cardiovascular status: blood pressure returned to baseline and stable Postop Assessment: no apparent nausea or vomiting Anesthetic complications: no   No notable events documented.  Last Vitals:  Vitals:   04/23/24 0942 04/23/24 0952  BP: (!) 101/58 (!) 99/56  Pulse: 61 61  Resp: 19 13  Temp:    SpO2: 93% 93%    Last Pain:  Vitals:   04/23/24 0952  TempSrc:   PainSc: 0-No pain                 Delon Aisha Arch

## 2024-04-23 NOTE — Discharge Instructions (Signed)

## 2024-04-24 ENCOUNTER — Ambulatory Visit (HOSPITAL_COMMUNITY): Admitting: Internal Medicine

## 2024-04-30 ENCOUNTER — Ambulatory Visit (INDEPENDENT_AMBULATORY_CARE_PROVIDER_SITE_OTHER)

## 2024-04-30 ENCOUNTER — Encounter (HOSPITAL_BASED_OUTPATIENT_CLINIC_OR_DEPARTMENT_OTHER): Payer: Self-pay

## 2024-04-30 VITALS — BP 118/76 | HR 78 | Ht 69.0 in | Wt 264.0 lb

## 2024-04-30 DIAGNOSIS — G4733 Obstructive sleep apnea (adult) (pediatric): Secondary | ICD-10-CM | POA: Diagnosis not present

## 2024-04-30 DIAGNOSIS — J432 Centrilobular emphysema: Secondary | ICD-10-CM | POA: Diagnosis not present

## 2024-04-30 DIAGNOSIS — J9611 Chronic respiratory failure with hypoxia: Secondary | ICD-10-CM | POA: Diagnosis not present

## 2024-04-30 DIAGNOSIS — J4530 Mild persistent asthma, uncomplicated: Secondary | ICD-10-CM | POA: Diagnosis not present

## 2024-04-30 DIAGNOSIS — Z87891 Personal history of nicotine dependence: Secondary | ICD-10-CM | POA: Diagnosis not present

## 2024-04-30 NOTE — Patient Instructions (Addendum)
 Complete home sleep test; ordered today.  Follow up in 6-8 weeks.  Continue Singulair  daily and Albuterol  every 4-6 hours as needed.

## 2024-04-30 NOTE — Progress Notes (Signed)
 "    Date:  05/07/2024    Veronica Rivers Patient Date of Birth: June 29, 1965 Medical Record #[id number]  PCP:  Marvene Prentice SAUNDERS, FNP  Cardiologist:  Delford    HPI: Veronica Rivers is a 58 y.o. female  with a history of HLD, SVT, obesity, COPD and type 2 DM. No known CAD. Echo in 2019 with normal EF. In the hospital July 2020 with atypical chest pain - felt to be GI related. Has had costochondritis in the past also. Was to have stress testing - this was never completed - she cancelled along with her follow up here.   Her dyspnea is with and without exertion. Her chest pain comes and goes - with and without exertion - some palpable pain but also not. Says it is no different from her hospitalization back in July. She is now on insulin . Her weight continues to climb. She is a former smoker.     She sees Dr Jude for COPD 25 pack year quit in 1999 CT with apical  Emphysema  Rx with Breo did not tolerate. worse with seasonal allergies CTA 11/30/22 no PE patchy ground glass attenuation in upper lobes She wears oxygen at night On 2 L's oxygen at home   Going through some depression/anxiety Has a long term therapist and now on Buspar Been bad since October 2023  Husband and daughter helpful    Admitted 11/25-27,2025 for dyspnea and edema Seen by Dr Sheena in hospital rate control with coreg  12.5 mg bid and anticoagulated with eliquis . Sent home on lasix  20 mg daily TTE with EF 50-55% trivial MR AV sclerosis normal RV  Discussed issues of rate control vs rhythm control She was started on eliquis  04/02/24 3 weeks of anticoagulation will be 04/23/24. She has been compliant with eliquis  no missed doses  DCC done 04/23/24 converted to NSR with one shock at 200 J'  Seen by Jude this week Getting sleep study Not clear to me why her lungs are som bad to require oxygen    Past Medical History:  Diagnosis Date   Anxiety    Asthma    Bipolar depression (HCC)    Cellulitis    CHEST PAIN    Depression    DM     DYSPNEA    Emphysema of lung (HCC)    HYPERCHOLESTEROLEMIA    HYPERLIPIDEMIA    HYPOTHYROIDISM    Neuropathy    Sleep apnea    home oxygen   SUPRAVENTRICULAR TACHYCARDIA     Past Surgical History:  Procedure Laterality Date   CARDIOVERSION N/A 04/23/2024   Procedure: CARDIOVERSION;  Surgeon: Delford Maude BROCKS, MD;  Location: [facility];  Service: Cardiovascular;  Laterality: N/A;   CESAREAN SECTION     COLONOSCOPY N/A 10/24/2020   Procedure: COLONOSCOPY;  Surgeon: Saintclair Jasper, MD;  Location: [facility];  Service: Gastroenterology;  Laterality: N/A;   FOOT SURGERY Left    HEMOSTASIS CLIP PLACEMENT  10/24/2020   Procedure: HEMOSTASIS CLIP PLACEMENT;  Surgeon: Saintclair Jasper, MD;  Location: [facility];  Service: Gastroenterology;;   LUMBAR LAMINECTOMY/DECOMPRESSION MICRODISCECTOMY Right 11/16/2023   Procedure: LUMBAR LAMINECTOMY/DECOMPRESSION MICRODISCECTOMY RIGHT LUMBAR TWO-THREE;  Surgeon: Mavis Purchase, MD;  Location: [facility] OR;  Service: Neurosurgery;  Laterality: Right;   NASAL SEPTUM SURGERY     NECK SURGERY     TONSILLECTOMY     TUBAL LIGATION       Medications: Current Meds  Medication Sig   albuterol  (VENTOLIN  HFA) 108 (90 Base)  MCG/ACT inhaler INHALE ONE TO TWO PUFFS BY MOUTH EVERY 4 HOURS AS NEEDED FOR FOR SHORTNESS OF BREATH OR WHEEZING (Patient taking differently: Inhale 2 puffs into the lungs every 4 (four) hours as needed for wheezing or shortness of breath.)   amphetamine -dextroamphetamine  (ADDERALL) 20 MG tablet Take 20 mg by mouth See admin instructions. Take 20 mg by mouth in the morning and afternoon   apixaban  (ELIQUIS ) 5 MG TABS tablet Take 1 tablet (5 mg total) by mouth 2 (two) times daily.   B Complex-Biotin-FA TABS Take 1 tablet by mouth daily.   Brimonidine Tartrate (LUMIFY OP) Place 1 drop into both eyes daily as needed.   buPROPion  (WELLBUTRIN  XL) 150 MG 24 hr tablet Take 450 mg by mouth in the morning.   CAPLYTA  42 MG capsule Take 42 mg by mouth  at bedtime.   carvedilol  (COREG ) 12.5 MG tablet TAKE 1 TABLET BY MOUTH 2 TIMES A DAY WITH A MEAL (Patient taking differently: Take 12.5 mg by mouth See admin instructions. Take 12.5 mg by mouth in the morning and afternoon)   Cholecalciferol (VITAMIN D3) 125 MCG (5000 UT) CAPS Take 15,000 Units by mouth in the morning.   Continuous Glucose Sensor (FREESTYLE LIBRE 3 PLUS SENSOR) MISC Inject 1 Device into the skin See admin instructions. Place 1 new sensor into the skin every 15 days   docusate sodium  (COLACE) 100 MG capsule Take 1 capsule (100 mg total) by mouth 2 (two) times daily. (Patient taking differently: Take 100 mg by mouth 2 (two) times daily as needed for mild constipation.)   DULoxetine  (CYMBALTA ) 30 MG capsule Take 90 mg by mouth daily.   famotidine  (PEPCID ) 40 MG tablet Take 40 mg by mouth daily before breakfast.   furosemide  (LASIX ) 40 MG tablet Take 1 tablet (40 mg total) by mouth daily.   HUMALOG KWIKPEN 100 UNIT/ML KwikPen Inject 6 Units into the skin 3 (three) times daily as needed (for a BGL greater than 80, but less than 200).   hydrocortisone cream 0.5 % Apply 1 application  topically daily as needed (skin irritation.).   INGREZZA  60 MG capsule Take 60 mg by mouth at bedtime.   lamoTRIgine  (LAMICTAL ) 150 MG tablet Take 300 mg by mouth at bedtime.   LANTUS  SOLOSTAR 100 UNIT/ML Solostar Pen Inject 100 Units into the skin See admin instructions. Sliding scale   levothyroxine  (SYNTHROID ) 125 MCG tablet Take 125 mcg by mouth daily before breakfast.   LORazepam  (ATIVAN ) 0.5 MG tablet Take 0.5 mg by mouth daily as needed.   methocarbamol  (ROBAXIN ) 500 MG tablet Take 500 mg by mouth at bedtime.   montelukast  (SINGULAIR ) 10 MG tablet TAKE 1 TABLET BY MOUTH AT BEDTIME   nitroGLYCERIN  (NITROSTAT ) 0.4 MG SL tablet Place 1 tablet (0.4 mg total) under the tongue every 5 (five) minutes as needed for chest pain.   NON FORMULARY Take 1 capsule by mouth See admin instructions. [place]  Mushroom Cognition/Brain Supplement/Brain Vitamins/Focus Supplement capsules - Take 1 capsule by mouth once a day   NURTEC 75 MG TBDP Take 75 mg by mouth daily as needed (for migraines - dissolve orally).   nystatin  (MYCOSTATIN ) 100000 UNIT/ML suspension Take 5 mLs by mouth 2 (two) times daily as needed (for mouth irritation).   Omega-3 Fatty Acids (FISH OIL PO) Take 2 capsules by mouth every evening.   ONE TOUCH ULTRA TEST test strip Inject 1 strip into the skin daily.   oxyCODONE -acetaminophen  (PERCOCET) 10-325 MG tablet Take 1 tablet by  mouth 5 (five) times daily as needed for pain.   OXYGEN Inhale 2 L/min into the lungs continuous.   PROMETHEGAN 12.5 MG suppository Place 12.5 mg rectally 2 (two) times daily as needed for nausea or vomiting.     Allergies: Allergies  Allergen Reactions   Prilosec [Omeprazole ] Shortness Of Breath and Other (See Comments)    Made her breathing worse   Atorvastatin  Dermatitis   Canagliflozin Other (See Comments)    Yeast infections (Invokana)    Cyclobenzaprine Other (See Comments)    Crazy in the head and Dissociation   Dulaglutide Nausea And Vomiting and Other (See Comments)    TRULICITY   Empagliflozin Other (See Comments)    Yeast infections (Jardiance)   Ezetimibe  Other (See Comments)    Miscla epain   Metformin  Hcl Nausea And Vomiting   Mobic  [Meloxicam ] Other (See Comments)    Pt stated, Got Burning, Acid Reflux with medicine Pt should not be on any anti-inflammatories d/t elevated LFTs.   Ozempic (0.25 Or 0.5 Mg-Dose) [Semaglutide(0.25 Or 0.5mg -Dos)] Nausea And Vomiting   Pravastatin Dermatitis   Semaglutide Other (See Comments)    Stomach upset (Ozempic, Wegovy, and Rybelsus)   Tape    Valacyclovir  Other (See Comments)    Agitation    Simvastatin  Rash and Other (See Comments)    Caused muscle pain, too   Sulfa Antibiotics Rash and Dermatitis    Social History: The patient  reports that she quit smoking about 26 years  ago. Her smoking use included cigarettes. She started smoking about 41 years ago. She has never used smokeless tobacco. She reports current drug use. Drug: Marijuana. She reports that she does not drink alcohol.   Family History: The patient's family history includes COPD in her mother; Cancer in her father; Heart attack in her mother; Heart disease in her mother; Hypertension in her mother.   Review of Systems: Please see the history of present illness.   All other systems are reviewed and negative.   Physical Exam: VS:  BP 110/66 (BP Location: Left Arm, Patient Position: Sitting, Cuff Size: Large)   Pulse 89   Ht 5' 8 (1.727 m)   Wt 266 lb (120.7 kg)   LMP  (LMP Unknown) Comment: post-meno  SpO2 94% Comment: With Intranasal oxygen supply.  BMI 40.45 kg/m  .  BMI Body mass index is 40.45 kg/m.  Wt Readings from Last 3 Encounters:  05/07/24 266 lb (120.7 kg)  04/30/24 264 lb (119.7 kg)  04/19/24 264 lb (119.7 kg)    Affect appropriate Healthy:  appears stated age HEENT: normal Neck supple with no adenopathy JVP normal no bruits no thyromegaly Lungs clear with no wheezing and good diaphragmatic motion Heart:  S1/S2 no murmur, no rub, gallop or click PMI normal Abdomen: benighn, BS positve, no tenderness, no AAA no bruit.  No HSM or HJR Distal pulses intact with no bruits No edema Neuro non-focal Skin warm and dry No muscular weakness   LABORATORY DATA:  EKG:    05/07/2024 SR rate 65 nonspecific ST changes    Lab Results  Component Value Date   WBC 8.5 04/19/2024   HGB 14.1 04/19/2024   HCT 44.2 04/19/2024   PLT 258 04/19/2024   GLUCOSE 196 (H) 04/19/2024   CHOL 224 (H) 12/19/2018   TRIG 112 12/19/2018   HDL 65 12/19/2018   LDLCALC 137 (H) 12/19/2018   ALT 11 04/04/2024   AST 13 (L) 04/04/2024   NA 140 04/19/2024  K 4.4 04/19/2024   CL 93 (L) 04/19/2024   CREATININE 0.90 04/19/2024   BUN 21 04/19/2024   CO2 30 (H) 04/19/2024   TSH 2.360 04/04/2024    HGBA1C 7.4 (H) 04/02/2024    Other Studies Reviewed Today:  Myovue: Mar 16, 2019  Study Highlights    There was no ST segment deviation noted during stress. Nuclear stress EF: 63%. The left ventricular ejection fraction is normal (55-65%). The study is normal. This is a low risk study.   Fixed apical inferolateral defect with normal wall motion in that region, suggestive of artifact   Echocardiogram  04/03/24   EF 65-70% mild MR   Assessment/Plan:  1. Chest pain - multiple risk factors atypical normal myovue 2019-03-16 observe  3. Swelling - dependant edema from weight October 2020 CT negative for PE TTE 04/03/24  EF 50-55% trivial MR normal RV and mild LVH with indeterminate diastolic parameters. Recent CHF exacerbation due to afib. Continue lasix    4. GERD - f/u with [facility] dilated CBD US  MRI normal 03/07/20   5. DM - per PCP - now on insulin    6.  HLD - on statin  7. COPD/asthma/emphysema - prior smoker. F/u Dr Jude abnormal gound glass in upper lobes on CTA 11/30/22 no PE Using oxygen at night  EF normal by TTE 04/03/24  PFT;s done 11/14/22 mild restriction and normal diffusion capacity  8.  Anxiety/Depression:  worse now on Buspar f/u [facility]   9.  Afib:  new diagnosis during hospitalization  04/02/24 rate control with coreg  Been on eliquis  since 04/02/24 DCC done 04/23/24  successful with one shock. Will order 30 day monitor to see any burden of PAF  30 day monitor PAF  Disposition:   F/U 3 months   Time:  Spent reviewing chart myovue,echo  pulmonary notes direct patient interview and composing note 20 minutes   Signed: [name], MD  05/07/2024 10:25 AM  [facility] [address] [place], [place]  [address] Phone: [phone] Fax: [phone]      "

## 2024-04-30 NOTE — Progress Notes (Signed)
 "  @Patient  ID: Veronica Rivers Patient, female    DOB: 02-24-1966, 58 y.o.   MRN: [id number]  Chief Complaint  Patient presents with   Asthma    Follow up     Referring provider: Marvene Prentice SAUNDERS, FNP  HPI: Discussed the use of AI scribe software for clinical note transcription with the patient, who gave verbal consent to proceed.  History of Present Illness Veronica Rivers is a 58 year old female with atrial fibrillation and congestive heart failure who presents for follow-up regarding her oxygen use and breathing difficulties.  She was hospitalized around Thanksgiving for atrial fibrillation and congestive heart failure, and was discharged on Thanksgiving Day. A successful cardioversion was performed on December 16th. Since then, her breathing has improved compared to her hospital stay, although it remains worse than her baseline prior to hospitalization. She increased her oxygen to three liters while in the hospital but has since reduced it to two liters at home, which she tolerates well. She is able to walk for about twenty minutes with oxygen set at two liters.  She uses an albuterol  inhaler about once a month and is on Eliquis  for atrial fibrillation. She also takes Singulair  for her breathing. She was previously on Symbicort  but discontinued it due to recurrent thrush, despite using nystatin  rinse and maintaining oral hygiene. She experiences dry mouth from multiple medications.  Lung function tests from March 24th showed reduced flows but normal volume and gas exchange. A CT scan from July 2024 showed emphysema in the upper lobes. She has a history of smoking. Despite this, her DLCO was in normal range (88).  She has a history of sleep apnea, with a sleep study from 2021 showing mild sleep apnea with an AHI of 7.8 and oxygen levels dropping to 77%. She does not use CPAP but uses oxygen at night. Her weight has fluctuated which may impact her sleep apnea severity. Her oxygen levels drop below 88%  at night, confirmed by her sleep study and personal monitoring.  Bending over causes her oxygen levels to drop and can lead to dizziness. She attributes some of her breathing difficulties to weight gain and acknowledges that her cardiac issues, including atrial fibrillation, complicate her ability to exercise and lose weight.  Last OV 11/09/2023 Mathias): Follow-up       L2, L3 surgery on 11-16-23 at [facility] with Dr Mavis.     presents for follow-up and pre-operative evaluation for planned L2-L3 microdiscectomy.   She uses oxygen therapy and can maintain adequate saturation for about two hours without it before levels drop to 77%, leading to symptoms. Her lung capacity remains relatively stable. She is actively working on weight loss, currently at 271 pounds, to improve respiratory function.   Her medications include Symbicort , which is effective, and she needs a refill of albuterol  with only sixteen puffs remaining. She takes furosemide  as needed for leg swelling. She previously discontinued a GLP-1 receptor agonist due to nausea and vomiting.   Significant tests/ events reviewed   11/2022 CT angiogram patchy GGO left more than right upper lobe 02/2019 CT angiogram negative 10/2017 CT angiogram -no  evidence of pulmonary embolus,changes of bullous emphysema in apices   Echo bubble study 11/2022 -negative, RVSP 35     PFT 11/14/2022 >> FVC 2.88 (70%), FEV1 2.45 (76%), ratio 85, DLCO 21.81 (88%)  04/2018 PFTs ratio normal, FEV1 82%, TLC 89%, DLCO 85%   HST at [facility] 02/2020 -very mild OSA ,  RDI 8/h,, methylphenidate for excessive daytime somnolence   - oxygen saturation lowest 74%, 301 minutes less than 89%  Allergies[1]  Immunization History  Administered Date(s) Administered   Fluzone Influenza virus vaccine,trivalent (IIV3), split virus 02/19/2009, 05/04/2017, 02/05/2019   Influenza, Quadrivalent, Recombinant, Inj, Pf 02/05/2019   Influenza,inj,Quad  PF,6+ Mos 05/04/2017, 02/07/2018   Influenza,inj,quad, With Preservative 01/20/2014, 02/24/2015   Influenza-Unspecified 02/17/2018   PFIZER(Purple Top)SARS-COV-2 Vaccination 07/11/2019, 08/10/2019, 03/05/2020   Pneumococcal Polysaccharide-23 08/21/2008, 07/16/2013   Tdap 11/10/2012, 08/11/2014    Past Medical History:  Diagnosis Date   Anxiety    Asthma    Bipolar depression (HCC)    Cellulitis    CHEST PAIN    Depression    DM    DYSPNEA    Emphysema of lung (HCC)    HYPERCHOLESTEROLEMIA    HYPERLIPIDEMIA    HYPOTHYROIDISM    Neuropathy    Sleep apnea    home oxygen   SUPRAVENTRICULAR TACHYCARDIA     Tobacco History: Tobacco Use History[2] Counseling given: Not Answered   Outpatient Medications Prior to Visit  Medication Sig Dispense Refill   albuterol  (VENTOLIN  HFA) 108 (90 Base) MCG/ACT inhaler INHALE ONE TO TWO PUFFS BY MOUTH EVERY 4 HOURS AS NEEDED FOR FOR SHORTNESS OF BREATH OR WHEEZING (Patient taking differently: Inhale 2 puffs into the lungs every 4 (four) hours as needed for wheezing or shortness of breath.) 8.5 g 2   amphetamine -dextroamphetamine  (ADDERALL) 20 MG tablet Take 20 mg by mouth See admin instructions. Take 20 mg by mouth in the morning and afternoon     apixaban  (ELIQUIS ) 5 MG TABS tablet Take 1 tablet (5 mg total) by mouth 2 (two) times daily. 60 tablet 2   B Complex-Biotin-FA TABS Take 1 tablet by mouth daily.     Brimonidine Tartrate (LUMIFY OP) Place 1 drop into both eyes daily as needed.     buPROPion  (WELLBUTRIN  XL) 150 MG 24 hr tablet Take 450 mg by mouth in the morning.     CAPLYTA  42 MG capsule Take 42 mg by mouth at bedtime.     carvedilol  (COREG ) 12.5 MG tablet TAKE 1 TABLET BY MOUTH 2 TIMES A DAY WITH A MEAL (Patient taking differently: Take 12.5 mg by mouth See admin instructions. Take 12.5 mg by mouth in the morning and afternoon) 90 tablet 0   Cholecalciferol (VITAMIN D3) 125 MCG (5000 UT) CAPS Take 15,000 Units by mouth in the morning.      Continuous Glucose Sensor (FREESTYLE LIBRE 3 PLUS SENSOR) MISC Inject 1 Device into the skin See admin instructions. Place 1 new sensor into the skin every 15 days     docusate sodium  (COLACE) 100 MG capsule Take 1 capsule (100 mg total) by mouth 2 (two) times daily. (Patient taking differently: Take 100 mg by mouth 2 (two) times daily as needed for mild constipation.) 30 capsule 0   DULoxetine  (CYMBALTA ) 30 MG capsule Take 90 mg by mouth daily.     famotidine  (PEPCID ) 40 MG tablet Take 40 mg by mouth daily before breakfast.     furosemide  (LASIX ) 40 MG tablet Take 1 tablet (40 mg total) by mouth daily. 30 tablet 11   HUMALOG KWIKPEN 100 UNIT/ML KwikPen Inject 6 Units into the skin 3 (three) times daily as needed (for a BGL greater than 80, but less than 200).     hydrocortisone cream 0.5 % Apply 1 application  topically daily as needed (skin irritation.).     INGREZZA  60 MG  capsule Take 60 mg by mouth at bedtime.     lamoTRIgine  (LAMICTAL ) 150 MG tablet Take 300 mg by mouth at bedtime.     LANTUS  SOLOSTAR 100 UNIT/ML Solostar Pen Inject 100 Units into the skin See admin instructions. Sliding scale     levothyroxine  (SYNTHROID ) 125 MCG tablet Take 125 mcg by mouth daily before breakfast.     LORazepam  (ATIVAN ) 0.5 MG tablet Take 0.5 mg by mouth daily as needed.     methocarbamol  (ROBAXIN ) 500 MG tablet Take 500 mg by mouth at bedtime.     montelukast  (SINGULAIR ) 10 MG tablet TAKE 1 TABLET BY MOUTH AT BEDTIME 90 tablet 1   nitroGLYCERIN  (NITROSTAT ) 0.4 MG SL tablet Place 1 tablet (0.4 mg total) under the tongue every 5 (five) minutes as needed for chest pain. 25 tablet 3   NON FORMULARY Take 1 capsule by mouth See admin instructions. [place] Mushroom Cognition/Brain Supplement/Brain Vitamins/Focus Supplement capsules - Take 1 capsule by mouth once a day     NURTEC 75 MG TBDP Take 75 mg by mouth daily as needed (for migraines - dissolve orally).     nystatin  (MYCOSTATIN ) 100000 UNIT/ML  suspension Take 5 mLs by mouth 2 (two) times daily as needed (for mouth irritation).     Omega-3 Fatty Acids (FISH OIL PO) Take 2 capsules by mouth every evening.     ONE TOUCH ULTRA TEST test strip Inject 1 strip into the skin daily.  3   oxyCODONE -acetaminophen  (PERCOCET) 10-325 MG tablet Take 1 tablet by mouth 5 (five) times daily as needed for pain.     OXYGEN Inhale 2 L/min into the lungs continuous.     PROMETHEGAN 12.5 MG suppository Place 12.5 mg rectally 2 (two) times daily as needed for nausea or vomiting.     No facility-administered medications prior to visit.     Review of Systems: as per hpi  Constitutional:   No  weight loss, night sweats,  Fevers, chills, fatigue, or  lassitude.  HEENT:   No headaches,  Difficulty swallowing,  Tooth/dental problems, or  Sore throat,                No sneezing, itching, ear ache, nasal congestion, post nasal drip,   CV:  No chest pain,  Orthopnea, PND, swelling in lower extremities, anasarca, dizziness, palpitations, syncope.   GI  No heartburn, indigestion, abdominal pain, nausea, vomiting, diarrhea, change in bowel habits, loss of appetite, bloody stools.   Resp: No shortness of breath with exertion or at rest.  No excess mucus, no productive cough,  No non-productive cough,  No coughing up of blood.  No change in color of mucus.  No wheezing.  No chest wall deformity  Skin: no rash or lesions.  GU: no dysuria, change in color of urine, no urgency or frequency.  No flank pain, no hematuria   MS:  No joint pain or swelling.  No decreased range of motion.  No back pain.    Physical Exam  BP 118/76   Pulse 78   Ht 5' 9 (1.753 m)   Wt 264 lb (119.7 kg)   LMP  (LMP Unknown) Comment: post-meno  SpO2 95% Comment: 2 liters  BMI 38.99 kg/m   GEN: A/Ox3; pleasant , NAD, well nourished    HEENT:  [place]/AT,  EACs-clear, TMs-wnl, NOSE-clear, THROAT-clear, no lesions, no postnasal drip or exudate noted. Mallampati 4  NECK:  Supple w/  fair ROM; no JVD; normal carotid impulses w/o  bruits; no thyromegaly or nodules palpated; no lymphadenopathy.    RESP  Clear  P & A; w/o, wheezes/ rales/ or rhonchi. no accessory muscle use, no dullness to percussion  CARD:  RRR, no m/r/g, no peripheral edema, pulses intact, no cyanosis or clubbing.  GI:   Soft & nt; nml bowel sounds; no organomegaly or masses detected.   Musco: Warm bil, no deformities or joint swelling noted.   Neuro: alert, no focal deficits noted.    Skin: Warm, no lesions or rashes    Lab Results:  CBC    Component Value Date/Time   WBC 8.5 04/19/2024 1204   WBC 9.1 04/02/2024 1258   RBC 4.67 04/19/2024 1204   RBC 4.68 04/02/2024 1258   HGB 14.1 04/19/2024 1204   HCT 44.2 04/19/2024 1204   PLT 258 04/19/2024 1204   MCV 95 04/19/2024 1204   MCH 30.2 04/19/2024 1204   MCH 30.3 04/02/2024 1258   MCHC 31.9 04/19/2024 1204   MCHC 32.0 04/02/2024 1258   RDW 12.6 04/19/2024 1204   LYMPHSABS 2.4 07/18/2022 1212   MONOABS 0.8 07/18/2022 1212   EOSABS 0.2 07/18/2022 1212   BASOSABS 0.0 07/18/2022 1212    BMET    Component Value Date/Time   NA 140 04/19/2024 1204   K 4.4 04/19/2024 1204   CL 93 (L) 04/19/2024 1204   CO2 30 (H) 04/19/2024 1204   GLUCOSE 196 (H) 04/19/2024 1204   GLUCOSE 189 (H) 04/04/2024 0357   BUN 21 04/19/2024 1204   CREATININE 0.90 04/19/2024 1204   CALCIUM  10.0 04/19/2024 1204   GFRNONAA >60 04/04/2024 0357   GFRAA 102 02/26/2019 0841    BNP    Component Value Date/Time   BNP 77.7 04/19/2024 1204    ProBNP    Component Value Date/Time   PROBNP 1,185.0 (H) 04/02/2024 1258   PROBNP 275 12/12/2022 1021   PROBNP 83.0 07/18/2022 1212    Imaging: ECHOCARDIOGRAM COMPLETE Result Date: 04/03/2024    ECHOCARDIOGRAM REPORT   Patient Name:   KYNZLEIGH BANDEL Date of Exam: 04/03/2024 Medical Rec #:  [id number]     Height:       69.0 in Accession #:    [id number]    Weight:       260.4 lb Date of Birth:  June 18, 1965     BSA:           2.311 m Patient Age:    58 years      BP:           151/81 mmHg Patient Gender: F             HR:           97 bpm. Exam Location:  Inpatient Procedure: 2D Echo, Cardiac Doppler, Color Doppler and Intracardiac            Opacification Agent (Both Spectral and Color Flow Doppler were            utilized during procedure). Indications:    Dyspnea  History:        Patient has prior history of Echocardiogram examinations, most                 recent 12/12/2022. Signs/Symptoms:Shortness of Breath and Dyspnea;                 Risk Factors:Diabetes, Dyslipidemia, Sleep Apnea and Family                 History of  Coronary Artery Disease.  Sonographer:    Philomena Daring Referring Phys: [id number] ABRAHAM FELIZ ORTIZ IMPRESSIONS  1. Left ventricular ejection fraction, by estimation, is 50 to 55%. The left ventricle has low normal function. The left ventricle has no regional wall motion abnormalities. There is mild left ventricular hypertrophy. Left ventricular diastolic parameters are indeterminate.  2. Right ventricular systolic function is normal. The right ventricular size is normal. Tricuspid regurgitation signal is inadequate for assessing PA pressure.  3. The mitral valve is normal in structure. Trivial mitral valve regurgitation. No evidence of mitral stenosis.  4. The aortic valve was not well visualized. Aortic valve regurgitation is not visualized. No aortic stenosis is present.  5. The inferior vena cava is dilated in size with <50% respiratory variability, suggesting right atrial pressure of 15 mmHg. FINDINGS  Left Ventricle: Left ventricular ejection fraction, by estimation, is 50 to 55%. The left ventricle has low normal function. The left ventricle has no regional wall motion abnormalities. Definity  contrast agent was given IV to delineate the left ventricular endocardial borders. The left ventricular internal cavity size was normal in size. There is mild left ventricular hypertrophy. Left ventricular diastolic  parameters are indeterminate. Right Ventricle: The right ventricular size is normal. No increase in right ventricular wall thickness. Right ventricular systolic function is normal. Tricuspid regurgitation signal is inadequate for assessing PA pressure. Left Atrium: Left atrial size was normal in size. Right Atrium: Right atrial size was normal in size. Pericardium: There is no evidence of pericardial effusion. Mitral Valve: The mitral valve is normal in structure. Trivial mitral valve regurgitation. No evidence of mitral valve stenosis. Tricuspid Valve: The tricuspid valve is normal in structure. Tricuspid valve regurgitation is trivial. Aortic Valve: The aortic valve was not well visualized. Aortic valve regurgitation is not visualized. No aortic stenosis is present. Pulmonic Valve: The pulmonic valve was not well visualized. Pulmonic valve regurgitation is trivial. Aorta: The aortic root and ascending aorta are structurally normal, with no evidence of dilitation. Venous: The inferior vena cava is dilated in size with less than 50% respiratory variability, suggesting right atrial pressure of 15 mmHg. IAS/Shunts: The interatrial septum was not well visualized.  LEFT VENTRICLE PLAX 2D LVIDd:         4.80 cm   Diastology LVIDs:         3.50 cm   LV e' medial:    8.92 cm/s LV PW:         1.00 cm   LV E/e' medial:  10.3 LV IVS:        1.00 cm   LV e' lateral:   10.90 cm/s LVOT diam:     2.10 cm   LV E/e' lateral: 8.4 LV SV:         55 LV SV Index:   24 LVOT Area:     3.46 cm  RIGHT VENTRICLE             IVC RV S prime:     11.60 cm/s  IVC diam: 2.40 cm TAPSE (M-mode): 2.4 cm LEFT ATRIUM             Index        RIGHT ATRIUM           Index LA diam:        4.50 cm 1.95 cm/m   RA Area:     18.60 cm LA Vol (A2C):   69.0 ml 29.85 ml/m  RA Volume:   45.90 ml  19.86 ml/m LA Vol (A4C):   49.6 ml 21.46 ml/m LA Biplane Vol: 61.7 ml 26.70 ml/m  AORTIC VALVE LVOT Vmax:   74.20 cm/s LVOT Vmean:  56.500 cm/s LVOT VTI:     0.158 m  AORTA Ao Root diam: 2.50 cm Ao Asc diam:  2.80 cm MITRAL VALVE MV Area (PHT): 5.93 cm    SHUNTS MV Decel Time: 128 msec    Systemic VTI:  0.16 m MV E velocity: 91.90 cm/s  Systemic Diam: 2.10 cm MV A velocity: 31.20 cm/s MV E/A ratio:  2.95 Lonni Nanas MD Electronically signed by Lonni Nanas MD Signature Date/Time: 04/03/2024/1:10:50 PM    Final    DG Chest Portable 1 View Result Date: 04/02/2024 CLINICAL DATA:  Shortness of breath.  On home oxygen. EXAM: PORTABLE CHEST 1 VIEW COMPARISON:  Radiographs 06/22/2023 and 07/11/2022.  CT 11/30/2022. FINDINGS: 1354 hours. The heart size and mediastinal contours are grossly stable allowing for the portable semi erect nature of the current study. There is increased vascular congestion without overt pulmonary edema. No confluent airspace disease, pneumothorax or significant pleural effusion. Previous lower cervical fusion without evidence of acute osseous abnormality. Telemetry leads overlie the chest. IMPRESSION: Increased vascular congestion without overt pulmonary edema or focal airspace disease. Electronically Signed   By: Elsie Perone M.D.   On: 04/02/2024 15:30    Administration History     None          Latest Ref Rng & Units 11/14/2022   10:48 AM 04/19/2018    8:42 AM  PFT Results  FVC-Pre L 2.78  3.25   FVC-Predicted Pre % 67  77   FVC-Post L 2.88  3.28   FVC-Predicted Post % 70  78   Pre FEV1/FVC % % 83  84   Post FEV1/FCV % % 85  85   FEV1-Pre L 2.31  2.73   FEV1-Predicted Pre % 72  82   FEV1-Post L 2.45  2.79   DLCO uncorrected ml/min/mmHg 21.81  26.94   DLCO UNC% % 88  85   DLCO corrected ml/min/mmHg 21.81    DLCO COR %Predicted % 88    DLVA Predicted % 122  110   TLC L 5.18  5.29   TLC % Predicted % 88  89   RV % Predicted % 86  96     No results found for: NITRICOXIDE   Assessment & Plan:   Assessment & Plan OSA (obstructive sleep apnea)  Asthma, well controlled, mild  persistent  Chronic respiratory failure with hypoxia (HCC)  Centrilobular emphysema (HCC)  Assessment and Plan Assessment & Plan Obstructive sleep apnea Mild obstructive sleep apnea with AHI of 7.8. Oxygen desaturation to 77% during sleep. Weight gain may exacerbate condition. Potential link to atrial fibrillation and cardiac dysfunction if uncontrolled. - Ordered home sleep test to reassess severity.  - Continue oxygen therapy at night.  Chronic respiratory failure with hypoxia Chronic respiratory failure with hypoxia likely multifactorial. Lung function tests show reduced flows but normal gas exchange, indicating cardiac contribution. - Continue oxygen therapy at 2 liters per minute.  Centrilobular emphysema CT scan shows centrilobular emphysema in upper lobes. Lung function tests indicate reduced flows but normal gas exchange. Smoking likely contributed. - Continue current management with Singulair  and albuterol  as needed.  Mild persistent asthma Managed with Singulair  and albuterol . Discontinued Symbicort  due to thrush. - Continue Singulair . - Use albuterol  inhaler as needed.    Return in about 7 weeks (around 06/18/2024) for  sleep study review.  Candis Dandy, PA-C 04/30/2024      [1]  Allergies Allergen Reactions   Prilosec [Omeprazole ] Shortness Of Breath and Other (See Comments)    Made her breathing worse   Atorvastatin  Dermatitis   Canagliflozin Other (See Comments)    Yeast infections (Invokana)    Cyclobenzaprine Other (See Comments)    Crazy in the head and Dissociation   Dulaglutide Nausea And Vomiting and Other (See Comments)    TRULICITY   Empagliflozin Other (See Comments)    Yeast infections (Jardiance)   Ezetimibe  Other (See Comments)    Miscla epain   Metformin  Hcl Nausea And Vomiting   Mobic  [Meloxicam ] Other (See Comments)    Pt stated, Got Burning, Acid Reflux with medicine Pt should not be on any anti-inflammatories d/t elevated LFTs.    Ozempic (0.25 Or 0.5 Mg-Dose) [Semaglutide(0.25 Or 0.5mg -Dos)] Nausea And Vomiting   Pravastatin Dermatitis   Semaglutide Other (See Comments)    Stomach upset (Ozempic, Wegovy, and Rybelsus)   Tape    Valacyclovir  Other (See Comments)    Agitation    Simvastatin  Rash and Other (See Comments)    Caused muscle pain, too   Sulfa Antibiotics Rash and Dermatitis  [2]  Social History Tobacco Use  Smoking Status Former   Current packs/day: 0.00   Types: Cigarettes   Start date: 11/24/1982   Quit date: 11/23/1997   Years since quitting: 26.4  Smokeless Tobacco Never   "

## 2024-05-07 ENCOUNTER — Ambulatory Visit: Admitting: Cardiovascular Disease

## 2024-05-07 VITALS — BP 110/66 | HR 89 | Ht 68.0 in | Wt 266.0 lb

## 2024-05-07 DIAGNOSIS — J432 Centrilobular emphysema: Secondary | ICD-10-CM

## 2024-05-07 DIAGNOSIS — I4891 Unspecified atrial fibrillation: Secondary | ICD-10-CM | POA: Diagnosis not present

## 2024-05-07 DIAGNOSIS — I10 Essential (primary) hypertension: Secondary | ICD-10-CM

## 2024-05-07 NOTE — Patient Instructions (Signed)
 Medication Instructions:  No medication changes were made at this visit. Continue current regimen.   *If you need a refill on your cardiac medications before your next appointment, please call your pharmacy*  Lab Work: None ordered today. If you have labs (blood work) drawn today and your tests are completely normal, you will receive your results only by: MyChart Message (if you have MyChart) OR A paper copy in the mail If you have any lab test that is abnormal or we need to change your treatment, we will call you to review the results.  Testing/Procedures: Cardiac event monitor 30 days  Follow-Up: At [facility], you and your health needs are our priority.  As part of our continuing mission to provide you with exceptional heart care, our providers are all part of one team.  This team includes your primary Cardiologist (physician) and Advanced Practice Providers or APPs (Physician Assistants and Nurse Practitioners) who all work together to provide you with the care you need, when you need it.  Your next appointment:   6 month(s)  Provider:   [name], MD

## 2024-05-13 ENCOUNTER — Telehealth: Payer: Self-pay | Admitting: *Deleted

## 2024-05-13 NOTE — Telephone Encounter (Signed)
"  ° °  Cardiac Monitor Alert  Date of alert:  05/13/2024   Patient Name: Veronica Rivers  DOB: [birth year]  MRN: [id number]   [facility] Cardiologist: [name], MD  [facility] EP:  None    Monitor Information: Cardiac Event Monitor [[employer]]  Reason:  atrial fib burden  Ordering provider:  peter nishan md   Alert Atrial Fibrillation/Flutter This is the 1st alert for this rhythm.  The patient has a hx of Atrial Fibrillation/Flutter.  The patient is not currently on anticoagulation.  Anticoagulation medication as of 05/13/2024           apixaban  (ELIQUIS ) 5 MG TABS tablet Take 1 tablet (5 mg total) by mouth 2 (two) times daily.       Next Cardiology Appointment   Date:  none recall for 6/26  Provider:  nishan  The patient was contacted today.  She was symptomatic.  She reports the following symptoms:  palpitations and slight chest pain Discussed with DOD dr ren, no changes at this time     Adrien Conquest, RN  05/13/2024 9:33 AM   "

## 2024-05-17 ENCOUNTER — Encounter: Payer: Self-pay | Admitting: Cardiovascular Disease

## 2024-05-20 ENCOUNTER — Other Ambulatory Visit: Payer: Self-pay | Admitting: Cardiovascular Disease

## 2024-05-28 ENCOUNTER — Encounter: Payer: Self-pay | Admitting: Cardiovascular Disease

## 2024-05-29 ENCOUNTER — Encounter: Payer: Self-pay | Admitting: Cardiovascular Disease

## 2024-05-30 NOTE — Telephone Encounter (Signed)
 Pt c/o BP issue: STAT if pt c/o blurred vision, one-sided weakness or slurred speech.  STAT if BP is GREATER than 180/120 TODAY.  STAT if BP is LESS than 90/60 and SYMPTOMATIC TODAY  1. What is your BP concern? Elevated BP  2. Have you taken any BP medication today?Yes  3. What are your last 5 BP readings?BP 145/101 Hr 109, BP 140/97 HR 111, BP 142/91 HR 119, BP 124/86 HR 117, BP 141/94 HR 110  4. Are you having any other symptoms (ex. Dizziness, headache, blurred vision, passed out)? Dizziness

## 2024-05-31 ENCOUNTER — Telehealth: Payer: Self-pay | Admitting: Cardiovascular Disease

## 2024-05-31 NOTE — Telephone Encounter (Signed)
 Pt called in about mychart messages that have been sent. Please advise if someone can review and give Dr. Claiborne recommendations.

## 2024-05-31 NOTE — Telephone Encounter (Signed)
 Spoke with Veronica Rivers. Gave Dr Claiborne recommendations. Will forward BP/HR readings for review in case of additional recommendations.

## 2024-06-12 ENCOUNTER — Ambulatory Visit

## 2024-06-12 ENCOUNTER — Ambulatory Visit: Payer: Self-pay | Admitting: Cardiovascular Disease

## 2024-06-12 DIAGNOSIS — I4891 Unspecified atrial fibrillation: Secondary | ICD-10-CM

## 2024-06-13 ENCOUNTER — Telehealth (HOSPITAL_BASED_OUTPATIENT_CLINIC_OR_DEPARTMENT_OTHER): Payer: Self-pay

## 2024-06-13 NOTE — Telephone Encounter (Unsigned)
 Copied from CRM [id number]. Topic: Clinical - Request for Lab/Test Order >> Jun 13, 2024  1:36 PM Ismael A wrote: Reason for CRM: patient states she was not able to do sleep study because she had a heart monitor that she had to wear for a month that was located in the same area that the sleep study monitor would go on, so she ended up returning the kit to [employer], she is requesting a new home sleep test to be ordered before her appt with Candis on 07/29/24

## 2024-06-14 NOTE — Telephone Encounter (Signed)
 Calling Veronica Rivers with [employer] to find out

## 2024-06-18 ENCOUNTER — Ambulatory Visit (HOSPITAL_BASED_OUTPATIENT_CLINIC_OR_DEPARTMENT_OTHER)

## 2024-07-29 ENCOUNTER — Ambulatory Visit (HOSPITAL_BASED_OUTPATIENT_CLINIC_OR_DEPARTMENT_OTHER)
